# Patient Record
Sex: Female | Born: 1970
Health system: Southern US, Community
[De-identification: ages and names within clinical notes are randomized; demographics above are authoritative.]

## PROBLEM LIST (undated history)

## (undated) DIAGNOSIS — K861 Other chronic pancreatitis: Secondary | ICD-10-CM

## (undated) DIAGNOSIS — F1910 Other psychoactive substance abuse, uncomplicated: Secondary | ICD-10-CM

## (undated) DIAGNOSIS — D72829 Elevated white blood cell count, unspecified: Secondary | ICD-10-CM

## (undated) DIAGNOSIS — F1010 Alcohol abuse, uncomplicated: Secondary | ICD-10-CM

## (undated) DIAGNOSIS — D649 Anemia, unspecified: Secondary | ICD-10-CM

## (undated) DIAGNOSIS — I10 Essential (primary) hypertension: Secondary | ICD-10-CM

## (undated) DIAGNOSIS — B9620 Unspecified Escherichia coli [E. coli] as the cause of diseases classified elsewhere: Secondary | ICD-10-CM

## (undated) DIAGNOSIS — R7881 Bacteremia: Secondary | ICD-10-CM

## (undated) DIAGNOSIS — T783XXA Angioneurotic edema, initial encounter: Secondary | ICD-10-CM

## (undated) DIAGNOSIS — L0390 Cellulitis, unspecified: Secondary | ICD-10-CM

## (undated) HISTORY — DX: Angioneurotic edema, initial encounter: T78.3XXA

## (undated) HISTORY — DX: Unspecified Escherichia coli (E. coli) as the cause of diseases classified elsewhere: B96.20

## (undated) HISTORY — DX: Bacteremia: R78.81

## (undated) HISTORY — PX: BREAST REDUCTION SURGERY: SHX8

## (undated) HISTORY — DX: Alcohol abuse, uncomplicated: F10.10

## (undated) HISTORY — DX: Elevated white blood cell count, unspecified: D72.829

## (undated) HISTORY — DX: Other chronic pancreatitis: K86.1

## (undated) HISTORY — DX: Essential (primary) hypertension: I10

## (undated) HISTORY — DX: Other psychoactive substance abuse, uncomplicated: F19.10

## (undated) HISTORY — DX: Anemia, unspecified: D64.9

---

## 1997-05-30 ENCOUNTER — Ambulatory Visit (HOSPITAL_COMMUNITY): Admission: RE | Admit: 1997-05-30 | Discharge: 1997-05-30 | Payer: Self-pay | Admitting: Obstetrics & Gynecology

## 1997-07-11 ENCOUNTER — Ambulatory Visit (HOSPITAL_COMMUNITY): Admission: RE | Admit: 1997-07-11 | Discharge: 1997-07-11 | Payer: Self-pay | Admitting: Obstetrics & Gynecology

## 1997-08-04 ENCOUNTER — Encounter: Admission: RE | Admit: 1997-08-04 | Discharge: 1997-11-02 | Payer: Self-pay | Admitting: Obstetrics

## 1997-08-18 ENCOUNTER — Ambulatory Visit (HOSPITAL_COMMUNITY): Admission: RE | Admit: 1997-08-18 | Discharge: 1997-08-18 | Payer: Self-pay | Admitting: Obstetrics

## 1997-08-26 ENCOUNTER — Inpatient Hospital Stay (HOSPITAL_COMMUNITY): Admission: AD | Admit: 1997-08-26 | Discharge: 1997-08-29 | Payer: Self-pay | Admitting: *Deleted

## 1997-10-24 ENCOUNTER — Emergency Department (HOSPITAL_COMMUNITY): Admission: EM | Admit: 1997-10-24 | Discharge: 1997-10-24 | Payer: Self-pay | Admitting: Emergency Medicine

## 1997-10-27 ENCOUNTER — Encounter: Admission: RE | Admit: 1997-10-27 | Discharge: 1997-10-27 | Payer: Self-pay | Admitting: Family Medicine

## 1998-02-03 ENCOUNTER — Encounter: Admission: RE | Admit: 1998-02-03 | Discharge: 1998-02-03 | Payer: Self-pay | Admitting: Family Medicine

## 1998-04-02 ENCOUNTER — Emergency Department (HOSPITAL_COMMUNITY): Admission: EM | Admit: 1998-04-02 | Discharge: 1998-04-02 | Payer: Self-pay | Admitting: Emergency Medicine

## 1998-07-18 ENCOUNTER — Emergency Department (HOSPITAL_COMMUNITY): Admission: EM | Admit: 1998-07-18 | Discharge: 1998-07-18 | Payer: Self-pay | Admitting: Emergency Medicine

## 2000-01-24 ENCOUNTER — Emergency Department (HOSPITAL_COMMUNITY): Admission: EM | Admit: 2000-01-24 | Discharge: 2000-01-24 | Payer: Self-pay | Admitting: Emergency Medicine

## 2000-12-04 ENCOUNTER — Encounter: Payer: Self-pay | Admitting: Emergency Medicine

## 2000-12-04 ENCOUNTER — Emergency Department (HOSPITAL_COMMUNITY): Admission: EM | Admit: 2000-12-04 | Discharge: 2000-12-04 | Payer: Self-pay | Admitting: Emergency Medicine

## 2001-01-05 ENCOUNTER — Other Ambulatory Visit: Admission: RE | Admit: 2001-01-05 | Discharge: 2001-01-05 | Payer: Self-pay | Admitting: Family Medicine

## 2001-01-05 ENCOUNTER — Encounter: Admission: RE | Admit: 2001-01-05 | Discharge: 2001-01-05 | Payer: Self-pay | Admitting: Family Medicine

## 2001-02-02 ENCOUNTER — Encounter: Admission: RE | Admit: 2001-02-02 | Discharge: 2001-02-02 | Payer: Self-pay | Admitting: Family Medicine

## 2001-05-11 ENCOUNTER — Encounter: Admission: RE | Admit: 2001-05-11 | Discharge: 2001-05-11 | Payer: Self-pay | Admitting: Family Medicine

## 2001-06-11 ENCOUNTER — Encounter: Admission: RE | Admit: 2001-06-11 | Discharge: 2001-06-11 | Payer: Self-pay | Admitting: Family Medicine

## 2001-10-06 ENCOUNTER — Encounter: Payer: Self-pay | Admitting: Sports Medicine

## 2001-10-06 ENCOUNTER — Encounter: Admission: RE | Admit: 2001-10-06 | Discharge: 2001-10-06 | Payer: Self-pay | Admitting: Family Medicine

## 2001-10-06 ENCOUNTER — Encounter: Admission: RE | Admit: 2001-10-06 | Discharge: 2001-10-06 | Payer: Self-pay | Admitting: Sports Medicine

## 2001-10-07 ENCOUNTER — Encounter: Admission: RE | Admit: 2001-10-07 | Discharge: 2001-10-07 | Payer: Self-pay | Admitting: Family Medicine

## 2001-10-21 ENCOUNTER — Encounter: Admission: RE | Admit: 2001-10-21 | Discharge: 2001-10-21 | Payer: Self-pay | Admitting: Family Medicine

## 2001-12-30 ENCOUNTER — Encounter: Admission: RE | Admit: 2001-12-30 | Discharge: 2001-12-30 | Payer: Self-pay | Admitting: Family Medicine

## 2002-01-11 ENCOUNTER — Encounter: Admission: RE | Admit: 2002-01-11 | Discharge: 2002-01-11 | Payer: Self-pay | Admitting: Family Medicine

## 2002-02-19 ENCOUNTER — Emergency Department (HOSPITAL_COMMUNITY): Admission: EM | Admit: 2002-02-19 | Discharge: 2002-02-19 | Payer: Self-pay | Admitting: Emergency Medicine

## 2002-02-22 ENCOUNTER — Encounter: Admission: RE | Admit: 2002-02-22 | Discharge: 2002-02-22 | Payer: Self-pay | Admitting: Family Medicine

## 2002-04-06 ENCOUNTER — Encounter: Admission: RE | Admit: 2002-04-06 | Discharge: 2002-04-06 | Payer: Self-pay | Admitting: Family Medicine

## 2002-05-05 ENCOUNTER — Encounter: Admission: RE | Admit: 2002-05-05 | Discharge: 2002-05-05 | Payer: Self-pay | Admitting: Family Medicine

## 2002-08-18 ENCOUNTER — Other Ambulatory Visit: Admission: RE | Admit: 2002-08-18 | Discharge: 2002-08-18 | Payer: Self-pay | Admitting: Family Medicine

## 2002-08-18 ENCOUNTER — Encounter: Admission: RE | Admit: 2002-08-18 | Discharge: 2002-08-18 | Payer: Self-pay | Admitting: Family Medicine

## 2002-09-10 ENCOUNTER — Encounter: Admission: RE | Admit: 2002-09-10 | Discharge: 2002-12-09 | Payer: Self-pay | Admitting: Family Medicine

## 2002-09-14 ENCOUNTER — Encounter: Admission: RE | Admit: 2002-09-14 | Discharge: 2002-09-14 | Payer: Self-pay | Admitting: Family Medicine

## 2002-09-30 ENCOUNTER — Encounter: Admission: RE | Admit: 2002-09-30 | Discharge: 2002-09-30 | Payer: Self-pay | Admitting: Sports Medicine

## 2003-01-06 ENCOUNTER — Encounter: Admission: RE | Admit: 2003-01-06 | Discharge: 2003-01-06 | Payer: Self-pay | Admitting: Family Medicine

## 2003-09-26 ENCOUNTER — Emergency Department (HOSPITAL_COMMUNITY): Admission: EM | Admit: 2003-09-26 | Discharge: 2003-09-26 | Payer: Self-pay | Admitting: *Deleted

## 2003-11-02 ENCOUNTER — Encounter: Admission: RE | Admit: 2003-11-02 | Discharge: 2003-11-02 | Payer: Self-pay | Admitting: Family Medicine

## 2004-01-11 ENCOUNTER — Inpatient Hospital Stay (HOSPITAL_COMMUNITY): Admission: EM | Admit: 2004-01-11 | Discharge: 2004-01-13 | Payer: Self-pay | Admitting: Emergency Medicine

## 2004-01-11 ENCOUNTER — Ambulatory Visit: Payer: Self-pay | Admitting: Family Medicine

## 2004-01-27 ENCOUNTER — Ambulatory Visit: Payer: Self-pay | Admitting: Family Medicine

## 2004-05-17 ENCOUNTER — Other Ambulatory Visit: Admission: RE | Admit: 2004-05-17 | Discharge: 2004-05-17 | Payer: Self-pay | Admitting: Family Medicine

## 2004-05-17 ENCOUNTER — Encounter (INDEPENDENT_AMBULATORY_CARE_PROVIDER_SITE_OTHER): Payer: Self-pay | Admitting: *Deleted

## 2004-05-17 ENCOUNTER — Ambulatory Visit: Payer: Self-pay | Admitting: Family Medicine

## 2004-08-28 ENCOUNTER — Ambulatory Visit: Payer: Self-pay | Admitting: Family Medicine

## 2005-11-22 ENCOUNTER — Ambulatory Visit: Payer: Self-pay | Admitting: Family Medicine

## 2005-12-12 ENCOUNTER — Ambulatory Visit: Payer: Self-pay | Admitting: Family Medicine

## 2005-12-16 ENCOUNTER — Ambulatory Visit: Payer: Self-pay | Admitting: Family Medicine

## 2005-12-18 ENCOUNTER — Ambulatory Visit: Payer: Self-pay | Admitting: Family Medicine

## 2005-12-28 ENCOUNTER — Encounter (INDEPENDENT_AMBULATORY_CARE_PROVIDER_SITE_OTHER): Payer: Self-pay | Admitting: *Deleted

## 2005-12-28 LAB — CONVERTED CEMR LAB

## 2006-01-09 ENCOUNTER — Ambulatory Visit: Payer: Self-pay | Admitting: Family Medicine

## 2006-01-22 ENCOUNTER — Ambulatory Visit: Payer: Self-pay | Admitting: Family Medicine

## 2006-06-26 DIAGNOSIS — E1140 Type 2 diabetes mellitus with diabetic neuropathy, unspecified: Secondary | ICD-10-CM | POA: Insufficient documentation

## 2006-06-26 DIAGNOSIS — I10 Essential (primary) hypertension: Secondary | ICD-10-CM | POA: Insufficient documentation

## 2006-06-27 ENCOUNTER — Encounter (INDEPENDENT_AMBULATORY_CARE_PROVIDER_SITE_OTHER): Payer: Self-pay | Admitting: *Deleted

## 2006-10-16 ENCOUNTER — Encounter (INDEPENDENT_AMBULATORY_CARE_PROVIDER_SITE_OTHER): Payer: Self-pay | Admitting: Family Medicine

## 2006-10-16 ENCOUNTER — Ambulatory Visit: Payer: Self-pay | Admitting: Sports Medicine

## 2006-10-16 LAB — CONVERTED CEMR LAB
BUN: 13 mg/dL (ref 6–23)
CO2: 20 meq/L (ref 19–32)
Calcium: 9.9 mg/dL (ref 8.4–10.5)
Chloride: 96 meq/L (ref 96–112)
Creatinine, Ser: 0.81 mg/dL (ref 0.40–1.20)
Glucose, Bld: 289 mg/dL — ABNORMAL HIGH (ref 70–99)
Potassium: 3.9 meq/L (ref 3.5–5.3)
Sodium: 137 meq/L (ref 135–145)

## 2006-10-17 ENCOUNTER — Encounter (INDEPENDENT_AMBULATORY_CARE_PROVIDER_SITE_OTHER): Payer: Self-pay | Admitting: Family Medicine

## 2007-01-20 ENCOUNTER — Encounter: Payer: Self-pay | Admitting: Family Medicine

## 2007-01-20 ENCOUNTER — Ambulatory Visit: Payer: Self-pay | Admitting: Family Medicine

## 2007-01-20 LAB — CONVERTED CEMR LAB
ALT: 11 units/L (ref 0–35)
AST: 12 units/L (ref 0–37)
Albumin: 4.1 g/dL (ref 3.5–5.2)
Alkaline Phosphatase: 92 units/L (ref 39–117)
Amylase: 24 units/L (ref 0–105)
BUN: 9 mg/dL (ref 6–23)
CO2: 20 meq/L (ref 19–32)
Calcium: 9 mg/dL (ref 8.4–10.5)
Chloride: 105 meq/L (ref 96–112)
Creatinine, Ser: 0.78 mg/dL (ref 0.40–1.20)
Direct LDL: 98 mg/dL
Glucose, Bld: 265 mg/dL — ABNORMAL HIGH (ref 70–99)
H Pylori IgG: NEGATIVE
Hgb A1c MFr Bld: 8.9 %
Lipase: <10 units/L (ref 0–75)
Potassium: 4.3 meq/L (ref 3.5–5.3)
Sodium: 138 meq/L (ref 135–145)
Total Bilirubin: 0.3 mg/dL (ref 0.3–1.2)
Total Protein: 6.6 g/dL (ref 6.0–8.3)

## 2007-03-11 ENCOUNTER — Encounter: Payer: Self-pay | Admitting: Family Medicine

## 2007-03-11 ENCOUNTER — Ambulatory Visit: Payer: Self-pay | Admitting: Family Medicine

## 2007-03-11 LAB — CONVERTED CEMR LAB
Direct LDL: 85 mg/dL
Hgb A1c MFr Bld: 10.1 %
TSH: 1.2 microintl units/mL (ref 0.350–5.50)

## 2007-04-07 ENCOUNTER — Ambulatory Visit: Payer: Self-pay | Admitting: Family Medicine

## 2007-04-07 ENCOUNTER — Encounter: Payer: Self-pay | Admitting: Family Medicine

## 2007-04-07 ENCOUNTER — Other Ambulatory Visit: Admission: RE | Admit: 2007-04-07 | Discharge: 2007-04-07 | Payer: Self-pay | Admitting: Emergency Medicine

## 2007-04-07 LAB — CONVERTED CEMR LAB
Chlamydia, DNA Probe: NEGATIVE
GC Probe Amp, Genital: NEGATIVE
Hgb A1c MFr Bld: 9.6 %
Whiff Test: NEGATIVE

## 2007-05-13 ENCOUNTER — Telehealth (INDEPENDENT_AMBULATORY_CARE_PROVIDER_SITE_OTHER): Payer: Self-pay | Admitting: Family Medicine

## 2007-05-27 ENCOUNTER — Ambulatory Visit: Payer: Self-pay | Admitting: Family Medicine

## 2007-05-29 ENCOUNTER — Ambulatory Visit: Payer: Self-pay | Admitting: Family Medicine

## 2007-09-09 ENCOUNTER — Ambulatory Visit: Payer: Self-pay | Admitting: Family Medicine

## 2007-09-09 ENCOUNTER — Encounter: Payer: Self-pay | Admitting: Family Medicine

## 2007-09-09 LAB — CONVERTED CEMR LAB
CO2: 20 meq/L (ref 19–32)
Calcium: 9.1 mg/dL (ref 8.4–10.5)
Chloride: 106 meq/L (ref 96–112)
Hgb A1c MFr Bld: 7.3 %
Sodium: 137 meq/L (ref 135–145)

## 2008-05-03 ENCOUNTER — Telehealth: Payer: Self-pay | Admitting: Family Medicine

## 2008-08-05 ENCOUNTER — Telehealth: Payer: Self-pay | Admitting: Family Medicine

## 2008-08-05 ENCOUNTER — Encounter: Payer: Self-pay | Admitting: *Deleted

## 2008-08-17 ENCOUNTER — Ambulatory Visit: Payer: Self-pay | Admitting: Family Medicine

## 2008-08-19 ENCOUNTER — Ambulatory Visit: Payer: Self-pay | Admitting: Family Medicine

## 2008-09-12 ENCOUNTER — Encounter: Payer: Self-pay | Admitting: Family Medicine

## 2008-09-12 ENCOUNTER — Ambulatory Visit: Payer: Self-pay | Admitting: Family Medicine

## 2008-09-12 DIAGNOSIS — E785 Hyperlipidemia, unspecified: Secondary | ICD-10-CM | POA: Insufficient documentation

## 2008-09-12 DIAGNOSIS — F17200 Nicotine dependence, unspecified, uncomplicated: Secondary | ICD-10-CM | POA: Insufficient documentation

## 2008-09-12 LAB — CONVERTED CEMR LAB: Whiff Test: POSITIVE

## 2008-09-13 LAB — CONVERTED CEMR LAB
Chlamydia, DNA Probe: NEGATIVE
GC Probe Amp, Genital: NEGATIVE

## 2008-09-16 ENCOUNTER — Ambulatory Visit: Payer: Self-pay | Admitting: Family Medicine

## 2008-09-16 ENCOUNTER — Encounter: Payer: Self-pay | Admitting: Family Medicine

## 2008-09-19 ENCOUNTER — Encounter: Payer: Self-pay | Admitting: Family Medicine

## 2008-09-19 LAB — CONVERTED CEMR LAB
ALT: 19 units/L (ref 0–35)
AST: 44 units/L — ABNORMAL HIGH (ref 0–37)
Alkaline Phosphatase: 75 units/L (ref 39–117)
BUN: 6 mg/dL (ref 6–23)
Basophils Absolute: 0 10*3/uL (ref 0.0–0.1)
Basophils Relative: 0 % (ref 0–1)
Calcium: 8.8 mg/dL (ref 8.4–10.5)
Chloride: 104 meq/L (ref 96–112)
Creatinine, Ser: 0.8 mg/dL (ref 0.40–1.20)
Eosinophils Absolute: 0 10*3/uL (ref 0.0–0.7)
Eosinophils Relative: 0 % (ref 0–5)
HCT: 31.8 % — ABNORMAL LOW (ref 36.0–46.0)
HDL: 77 mg/dL (ref >39)
LDL Cholesterol: 43 mg/dL (ref 0–99)
Lymphs Abs: 1.4 10*3/uL (ref 0.7–4.0)
MCV: 80.9 fL (ref 78.0–100.0)
Neutrophils Relative %: 67 % (ref 43–77)
Platelets: 191 10*3/uL (ref 150–400)
RDW: 17.4 % — ABNORMAL HIGH (ref 11.5–15.5)
Total Bilirubin: 0.4 mg/dL (ref 0.3–1.2)
Total CHOL/HDL Ratio: 1.7
VLDL: 14 mg/dL (ref 0–40)
WBC: 5.7 10*3/uL (ref 4.0–10.5)

## 2008-09-27 ENCOUNTER — Telehealth: Payer: Self-pay | Admitting: Family Medicine

## 2008-09-28 ENCOUNTER — Encounter: Payer: Self-pay | Admitting: Family Medicine

## 2008-09-28 DIAGNOSIS — D649 Anemia, unspecified: Secondary | ICD-10-CM | POA: Insufficient documentation

## 2009-05-08 ENCOUNTER — Encounter (INDEPENDENT_AMBULATORY_CARE_PROVIDER_SITE_OTHER): Payer: Self-pay | Admitting: *Deleted

## 2009-05-30 ENCOUNTER — Telehealth: Payer: Self-pay | Admitting: Family Medicine

## 2009-06-27 ENCOUNTER — Emergency Department (HOSPITAL_COMMUNITY): Admission: EM | Admit: 2009-06-27 | Discharge: 2009-06-28 | Payer: Self-pay | Admitting: Emergency Medicine

## 2009-07-05 ENCOUNTER — Ambulatory Visit: Payer: Self-pay | Admitting: Family Medicine

## 2009-07-05 ENCOUNTER — Encounter: Payer: Self-pay | Admitting: Family Medicine

## 2009-07-05 DIAGNOSIS — Z9189 Other specified personal risk factors, not elsewhere classified: Secondary | ICD-10-CM | POA: Insufficient documentation

## 2009-07-06 LAB — CONVERTED CEMR LAB
ALT: 19 units/L (ref 0–35)
Barbiturate Quant, Ur: NEGATIVE
Basophils Relative: 0 % (ref 0–1)
CO2: 28 meq/L (ref 19–32)
Calcium: 9.4 mg/dL (ref 8.4–10.5)
Chloride: 100 meq/L (ref 96–112)
Creatinine,U: 56.9 mg/dL
Eosinophils Absolute: 0.1 10*3/uL (ref 0.0–0.7)
Ethyl Alcohol: <10 mg/dL (ref <10)
Ferritin: 14 ng/mL (ref 10–291)
Glucose, Bld: 273 mg/dL — ABNORMAL HIGH (ref 70–99)
Hemoglobin: 10.2 g/dL — ABNORMAL LOW (ref 12.0–15.0)
Lymphs Abs: 1.6 10*3/uL (ref 0.7–4.0)
MCHC: 31.8 g/dL (ref 30.0–36.0)
MCV: 86.8 fL (ref 78.0–100.0)
Methadone: NEGATIVE
Monocytes Absolute: 0.4 10*3/uL (ref 0.1–1.0)
Monocytes Relative: 6 % (ref 3–12)
Neutrophils Relative %: 67 % (ref 43–77)
Opiate Screen, Urine: NEGATIVE
Propoxyphene: NEGATIVE
RBC: 3.7 M/uL — ABNORMAL LOW (ref 3.87–5.11)
Sodium: 136 meq/L (ref 135–145)
TSH: 1.215 microintl units/mL (ref 0.350–4.500)
Total Protein: 6.3 g/dL (ref 6.0–8.3)
UIBC: 395 ug/dL
WBC: 6 10*3/uL (ref 4.0–10.5)

## 2009-09-27 ENCOUNTER — Ambulatory Visit: Payer: Self-pay | Admitting: Family Medicine

## 2009-09-27 ENCOUNTER — Encounter: Payer: Self-pay | Admitting: Family Medicine

## 2009-09-27 DIAGNOSIS — L989 Disorder of the skin and subcutaneous tissue, unspecified: Secondary | ICD-10-CM | POA: Insufficient documentation

## 2009-10-06 LAB — CONVERTED CEMR LAB
Basophils Absolute: 0 10*3/uL (ref 0.0–0.1)
Basophils Relative: 1 % (ref 0–1)
Eosinophils Absolute: 0.1 10*3/uL (ref 0.0–0.7)
Eosinophils Relative: 2 % (ref 0–5)
HCT: 28.3 % — ABNORMAL LOW (ref 36.0–46.0)
Lymphs Abs: 1.4 10*3/uL (ref 0.7–4.0)
MCHC: 32.2 g/dL (ref 30.0–36.0)
MCV: 87.1 fL (ref 78.0–100.0)
Platelets: 230 10*3/uL (ref 150–400)
RDW: 17.1 % — ABNORMAL HIGH (ref 11.5–15.5)

## 2009-10-09 ENCOUNTER — Encounter: Payer: Self-pay | Admitting: *Deleted

## 2009-10-09 ENCOUNTER — Ambulatory Visit: Payer: Self-pay | Admitting: Family Medicine

## 2009-10-09 ENCOUNTER — Encounter: Payer: Self-pay | Admitting: Family Medicine

## 2009-10-10 LAB — CONVERTED CEMR LAB
Basophils Absolute: 0 10*3/uL (ref 0.0–0.1)
Basophils Relative: 0 % (ref 0–1)
Hemoglobin: 10.2 g/dL — ABNORMAL LOW (ref 12.0–15.0)
MCHC: 31.9 g/dL (ref 30.0–36.0)
Monocytes Absolute: 0.5 10*3/uL (ref 0.1–1.0)
Neutro Abs: 4.5 10*3/uL (ref 1.7–7.7)
Platelets: 260 10*3/uL (ref 150–400)
RDW: 17.9 % — ABNORMAL HIGH (ref 11.5–15.5)
Retic Ct Pct: 1.5 % (ref 0.4–3.1)

## 2009-10-11 ENCOUNTER — Ambulatory Visit: Payer: Self-pay | Admitting: Family Medicine

## 2009-10-17 ENCOUNTER — Telehealth: Payer: Self-pay | Admitting: Family Medicine

## 2009-12-20 ENCOUNTER — Encounter: Payer: Self-pay | Admitting: Family Medicine

## 2010-02-14 ENCOUNTER — Telehealth: Payer: Self-pay | Admitting: *Deleted

## 2010-02-19 ENCOUNTER — Encounter: Payer: Self-pay | Admitting: *Deleted

## 2010-03-12 ENCOUNTER — Telehealth (INDEPENDENT_AMBULATORY_CARE_PROVIDER_SITE_OTHER): Payer: Self-pay | Admitting: *Deleted

## 2010-03-14 ENCOUNTER — Telehealth: Payer: Self-pay | Admitting: *Deleted

## 2010-04-09 ENCOUNTER — Ambulatory Visit: Payer: Self-pay

## 2010-05-31 NOTE — Miscellaneous (Signed)
Summary: needs meter  Clinical Lists Changes pt [facility] not [facility] Pro[facility]y [facility] since medicaid is rejecting it. I spoke with the pharmacist who states when she gets time, she will call medicaid to find out why not. lm for pt that [facility] & I will call her when I have an answer.Marland KitchenMarland KitchenGolden Circle RN  July 05, 2009 4:35 PM   Appended Document: needs meter the prodigy went thru this time

## 2010-05-31 NOTE — Letter (Signed)
Summary: Generic Letter  [facility]  [address]   [place], [place] [address]   Phone: [phone]  Fax: [phone]      Christine [name] [address] [place], [place]  [address]  Dear [name],  I will provide you a 1 month refill on the Metformin but you are overdue for a follow up appointment.  Please call the office at [phone] to schedule an appointment.   We were unable to reach you by phone, please call the office at [phone] with a contact number.  Sincerely,   Gladstone Pih

## 2010-05-31 NOTE — Progress Notes (Signed)
Summary: needs note  Phone Note Call from Patient Call back at [phone]   Caller: Patient Summary of Call: needs a note stating what kind of condtion/sickness she had during the months of Jan & Feb 2011- needs to give this to school for her being absent. Initial call taken by: De Nurse,  February 14, 2010 2:04 PM  Follow-up for Phone Call        DWT we have NO office visits for her here in [place] or Feb 2011 so I obviously cannot write her a note.  Thanks!  Denny Levy MD  February 15, 2010 12:22 PM   Additional Follow-up for Phone Call Additional follow up Details #1::        called to inform and got hung up on. will try again later Additional Follow-up by: Jimmy Footman, CMA,  February 15, 2010 1:58 PM    Additional Follow-up for Phone Call Additional follow up Details #2::    pt stated that it was not jan or feb. informed her that we have mar and june. I will write letter stating this and mail it  Follow-up by: Loralee Pacas CMA,  February 19, 2010 10:24 AM

## 2010-05-31 NOTE — Assessment & Plan Note (Signed)
Summary: f/u chronic issues   Vital Signs:  Patient profile:   40 year old female Height:      69 inches Weight:      139.3 pounds BMI:     20.65 Temp:     97.7 degrees F oral Pulse rate:   87 / minute BP sitting:   108 / 69  (left arm) Cuff size:   regular  Vitals Entered By: Gladstone Pih (July 05, 2009 8:38 AM) CC: f/u ER visit Is Patient Diabetic? Yes Did you bring your meter with you today? No Pain Assessment Patient in pain? no        Primary Care Provider:  Marisue Ivan  MD  CC:  f/u ER visit.  History of Present Illness: 40yo F here for ER f/u and f/u of chronic issues  s/p ER visit: 06/28/2009 dx of gastroparesis.  Resolved now.  She never picked up the Reglan prescribed.   HTN: No adverse effects from medication.  Not checking it regularly.  Was not well controlled at last visit.  No dizziness, HA, CP, palpitations, or swelling.  DM:  Currently on Lantus 18units in the AM, Metformin, and Glipizide (out of med for several months).  No adverse effects.  Pt states that she was not able to obtain the glucometer.  Due for diabetic eye exam.  HLD: Tolerating medication.  No adverse effects.  No muscle pain or abd pain.  Unintentional wt loss: Loss over 10lbs since last visit.  Denies any diarrhea or N/V.  Appetite is decreased. No fevers or night sweats.  Has some temperature instability.    Anemia: Normocytic anemia.  Not taking iron supplements.         Habits & Providers  Alcohol-Tobacco-Diet     Tobacco Status: current     Tobacco Counseling: to quit use of tobacco products     Cigarette Packs/Day: 0.5  Current Medications (verified): 1)  Glipizide 10 Mg Tb24 (Glipizide) .... Take 1 Tablet By Mouth Once A Day 2)  Lantus 100 Unit/ml Soln (Insulin Glargine) .... Inject 18 Unit Subcutaneously Once A Day 3)  Lisinopril-Hydrochlorothiazide 20-25 Mg Tabs (Lisinopril-Hydrochlorothiazide) .... One Tablet By Mouth Daily 4)  Metformin Hcl 1000 Mg Tabs  (Metformin Hcl) .... Take 1 Tablet By Mouth Twice A Day 5)  Simvastatin 20 Mg Tabs (Simvastatin) .Marland Kitchen.. 1 Tab By Mouth At Bedtime. 6)  Prodigy Blood Glucose Test  Strp (Glucose Blood) .... Use Up To 3 Times A Day To Test Blood Sugar 7)  Prodigy Twist Top Lancets 28g  Misc (Lancets) .... Use Up To 3 Times A Day To Check Blood Sugars 8)  Ferrous Sulfate 324 Mg Tbec (Ferrous Sulfate) .... One Tablet By Mouth Two Times A Day 9)  Prodigy Autocode Blood Glucose  Devi (Blood Glucose Monitoring Suppl) .... Check Blood Sugar Up To 3 Times A Day  Allergies (verified): No Known Drug Allergies  Past History:  Past Medical History: Tobacco use HTN DM I HLD Anemia  Social History: Packs/Day:  0.5  Review of Systems       No dizziness, HA, CP, palpitations, or swelling. No muscle pain or abd pain.  Physical Exam  General:  VS Reviewed. Well appearing, NAD.  Neck:  supple, full ROM, no goiter or mass  Lungs:  Normal respiratory effort, chest expands symmetrically. Lungs are clear to auscultation, no crackles or wheezes. Heart:  Normal rate and regular rhythm. S1 and S2 normal without gallop, murmur, click, rub or other extra  sounds. Abdomen:  Soft, NT, ND, no HSM, active BS  Extremities:  no edema Neurologic:  no focal deficits   Impression & Recommendations:  Problem # 1:  WEIGHT LOSS (ICD-783.21) Assessment New Lost 12lbs since 08/2008.  Unintentional.  No red flag symptoms. Will check TSH and follow trend. Will have her return in 2 weeks and will discuss interventions to help with maintaining wt.  Orders: TSH-[facility] ([address]) [facility]- Est  Level 4 ([address])  Problem # 2:  HYPERTENSION, BENIGN SYSTEMIC (ICD-401.1) Assessment: Improved  At goal (<140/90). No changes in meds. States that she has enough medications. Will check renal fxn and electrolytes today.  Her updated medication list for this problem includes:    Lisinopril-hydrochlorothiazide 20-25 Mg Tabs  (Lisinopril-hydrochlorothiazide) ..... One tablet by mouth daily  Orders: [facility]- Est  Level 4 ([address])  Problem # 3:  DIABETES MELLITUS, I (ICD-250.01) Assessment: Deteriorated A1c 11%.  Worsened course. She is not checking her blood sugar so I'm hesistant to go up on the Lantus until she obtains a glucometer to check her CBGs.  Advised to restart the glipizide.  I think that the gastroparesis is a result of her uncontrolled DM.   Will set her up for diabetic eye exam.  Her updated medication list for this problem includes:    Glipizide 10 Mg Tb24 (Glipizide) .Marland Kitchen... Take 1 tablet by mouth once a day    Lantus 100 Unit/ml Soln (Insulin glargine) ..... Inject 18 unit subcutaneously once a day    Lisinopril-hydrochlorothiazide 20-25 Mg Tabs (Lisinopril-hydrochlorothiazide) ..... One tablet by mouth daily    Metformin Hcl 1000 Mg Tabs (Metformin hcl) .Marland Kitchen... Take 1 tablet by mouth twice a day  Orders: A1C-[facility] (29562) [facility]- Est  Level 4 ([address])  Problem # 4:  ANEMIA (ICD-285.9) Assessment: Unchanged Pt not symptomatic.   She is not taking her iron supplements. Plan to recheck CBC w/ diff today along with an anemia panel.   Will have her restart the iron supplements.  Her updated medication list for this problem includes:    Ferrous Sulfate 324 Mg Tbec (Ferrous sulfate) ..... One tablet by mouth two times a day  Orders: CBC w/Diff-[facility] ([address]) Ferritin-[facility] [phone]) Iron -[facility] ([id number]) Iron Binding Cap (TIBC)-[facility] ([address]) [facility]- Est  Level 4 ([address])  Problem # 5:  HYPERLIPIDEMIA (ICD-272.4) Assessment: Unchanged Pt not fasting today.  She was at goal prior. Will check LDL today.  Her updated medication list for this problem includes:    Simvastatin 20 Mg Tabs (Simvastatin) .Marland Kitchen... 1 tab by mouth at bedtime.  Orders: T-Lipoprotein (LDL cholesterol)  ([id number]) [facility]- Est  Level 4 ([address])  Problem # 6:  DRUG ABUSE, HX OF (ICD-V15.89) Assessment: Comment Only Pt requesting  a UDS b/c she is about to apply for a job. Discussed risks and hazards of marjuana use.  Orders: Miscellaneous Lab Charge-[facility] [id number]) [facility]- Est  Level 4 ([address])  Complete Medication List: 1)  Glipizide 10 Mg Tb24 (Glipizide) .... Take 1 tablet by mouth once a day 2)  Lantus 100 Unit/ml Soln (Insulin glargine) .... Inject 18 unit subcutaneously once a day 3)  Lisinopril-hydrochlorothiazide 20-25 Mg Tabs (Lisinopril-hydrochlorothiazide) .... One tablet by mouth daily 4)  Metformin Hcl 1000 Mg Tabs (Metformin hcl) .... Take 1 tablet by mouth twice a day 5)  Simvastatin 20 Mg Tabs (Simvastatin) .Marland Kitchen.. 1 tab by mouth at bedtime. 6)  Prodigy Blood Glucose Test Strp (Glucose blood) .... Use up to 3 times a day to test blood sugar 7)  Prodigy  Twist Top Lancets 28g Misc (Lancets) .... Use up to 3 times a day to check blood sugars 8)  Ferrous Sulfate 324 Mg Tbec (Ferrous sulfate) .... One tablet by mouth two times a day 9)  Prodigy Autocode Blood Glucose Devi (Blood glucose monitoring suppl) .... Check blood sugar up to 3 times a day  Other Orders: T-Comprehensive Metabolic Panel ([id number])  Patient Instructions: 1)  Please schedule a follow-up appointment in 2 weeks to discuss wt loss and diabetes. 2)  Pick up the prodigy glucometer and bring it with you next time.  If you run high >200 in the morning, call me.   Prescriptions: PRODIGY AUTOCODE BLOOD GLUCOSE  DEVI (BLOOD GLUCOSE MONITORING SUPPL) check blood sugar up to 3 times a day  #1 x 0   Entered and Authorized by:   Marisue Ivan  MD   Signed by:   Marisue Ivan  MD on 07/05/2009   Method used:   [facility]  [address] [phone][facility] ([facility]       [address]       [place], [place]  [id number]       Ph: [phone] or [phone]       Fax: [phone]   RxID:   [id number] METFORMIN HCL 1000 MG TABS (METFORMIN HCL) Take 1 tablet by mouth twice a day  #180 x 0   Entered and  Authorized by:   Marisue Ivan  MD   Signed by:   Marisue Ivan  MD on 07/05/2009   Method used:   [facility]        [facility]  [address] [phone]* (retail)       [address]       [place], [place]  [address]       Ph: [phone] or [phone]       Fax: [phone]   RxID:   [id number] GLIPIZIDE 10 MG TB24 (GLIPIZIDE) Take 1 tablet by mouth once a day  #90 x 0   Entered and Authorized by:   Marisue Ivan  MD   Signed by:   Marisue Ivan  MD on 07/05/2009   Method used:   [facility]        [facility] [address] [phone]* (retail)       [address]       [place], [place]  [address]       Ph: [phone] or [phone]       Fax: [phone]   RxID:   [id number]   Laboratory Results   Blood Tests   Date/Time Received: July 05, 2009 8:57 AM  Date/Time Reported: July 05, 2009 9:13 AM   HGBA1C: 11.0%   (Normal Range: Non-Diabetic - 3-6%   Control Diabetic - 6-8%)  Comments: ...........test performed by...........Marland KitchenTerese Door, CMA      Prevention & Chronic Care Immunizations   Influenza vaccine: Not documented   Influenza vaccine deferral: Refused  (07/05/2009)    Tetanus booster: 04/29/2002: Done.    Pneumococcal vaccine: Not documented  Other Screening   Pap smear: NEGATIVE FOR INTRAEPITHELIAL LESIONS OR MALIGNANCY.  (09/12/2008)   Smoking status: current  (07/05/2009)   Smoking cessation counseling: yes  (09/09/2007)  Diabetes Mellitus   HgbA1C: 11.0  (07/05/2009)    Eye exam: Not documented  Diabetic eye exam action/deferral: Ophthalmology referral  (07/05/2009)    Foot exam: yes  (09/09/2007)   Foot exam action/deferral: Do today   High risk foot: Not documented   Foot care education: Done  (07/05/2009)    Urine microalbumin/creatinine ratio: Not documented    Diabetes flowsheet reviewed?: Yes   Progress toward A1C goal: Deteriorated  Lipids    Total Cholesterol: 134  (09/16/2008)   Lipid panel action/deferral: LDL Direct Ordered   LDL: 43  (09/16/2008)   LDL Direct: 85  (03/11/2007)   HDL: 77  (09/16/2008)   Triglycerides: 69  (09/16/2008)    SGOT (AST): 44  (09/16/2008)   BMP action: Ordered   SGPT (ALT): 19  (09/16/2008) CMP ordered    Alkaline phosphatase: 75  (09/16/2008)   Total bilirubin: 0.4  (09/16/2008)    Lipid flowsheet reviewed?: Yes   Progress toward LDL goal: Unchanged  Hypertension   Last Blood Pressure: 108 / 69  (07/05/2009)   Serum creatinine: 0.80  (09/16/2008)   BMP action: Ordered   Serum potassium 3.7  (09/16/2008) CMP ordered     Hypertension flowsheet reviewed?: Yes   Progress toward BP goal: At goal  Self-Management Support :   Personal Goals (by the next clinic visit) :     Personal A1C goal: 8  (07/05/2009)     Personal blood pressure goal: 140/90  (07/05/2009)     Personal LDL goal: 100  (07/05/2009)    Patient will work on the following items until the next clinic visit to reach self-care goals:     Medications and monitoring: take my medicines every day, check my blood sugar, check my blood pressure, bring all of my medications to every visit  (07/05/2009)     Eating: drink diet soda or water instead of juice or soda, eat more vegetables, use fresh or frozen vegetables, eat foods that are low in salt, eat baked foods instead of fried foods, eat fruit for snacks and desserts, limit or avoid alcohol  (07/05/2009)    Diabetes self-management support: CBG self-monitoring log, Written self-care plan, Education handout  (07/05/2009)   Diabetes care plan printed   Diabetes education handout printed    Hypertension self-management support: CBG self-monitoring log, Written self-care plan  (07/05/2009)   Hypertension self-care plan printed.    Lipid self-management support: Written self-care plan  (07/05/2009)   Lipid self-care plan printed.   Nursing Instructions: Diabetic foot exam  today Refer for screening diabetic eye exam (see order)     Diabetic Foot Exam Foot Inspection Is there a history of a foot ulcer?              No Is there a foot ulcer now?              No Can the patient see the bottom of their feet?          Yes Are the shoes appropriate in style and fit?          Yes Is there swelling or an abnormal foot shape?          No Are the toenails long?                No Are the toenails thick?                No Are the toenails ingrown?              No Is there heavy callous build-up?  No Is there pain in the calf muscle (Intermittent claudication) when walking?    NoIs there a claw toe deformity?              No Is there elevated skin temperature?            No Is there limited ankle dorsiflexion?            No Is there foot or ankle muscle weakness?            No  Diabetic Foot Care Education Patient educated on appropriate care of diabetic feet.     10-g (5.07) Semmes-Weinstein Monofilament Test           Right Foot          Left Foot Visual Inspection     normal           normal Site 1         abnormal         normal Site 2         abnormal         normal Site 3         abnormal         normal Site 4         abnormal         normal Site 5         abnormal         abnormal Site 6         normal         normal

## 2010-05-31 NOTE — Progress Notes (Signed)
Summary: refill  Phone Note Refill Request Call back at [phone] Message from:  Patient  Refills Requested: Medication #1:  METFORMIN HCL 1000 MG TABS Take 1 tablet by mouth twice a day  Medication #2:  LISINOPRIL-HYDROCHLOROTHIAZIDE 20-25 MG TABS one tablet by mouth daily  Medication #3:  FERROUS SULFATE 324 MG TBEC one tablet by mouth two times a day  Medication #4:  GLIPIZIDE XL 10 MG XR24H-TAB 1 tab by mouth once daily. [facility] [address]  Next Appointment Scheduled: 03/30/10 Initial call taken by: De Nurse,  March 12, 2010 10:57 AM    Prescriptions: FERROUS SULFATE 324 MG TBEC (FERROUS SULFATE) one tablet by mouth two times a day  #40 x 0   Entered by:   Theresia Lo RN   Authorized by:   Lloyd Huger MD   Signed by:   Theresia Lo RN on 03/12/2010   Method used:   [facility]        [facility]  [address] [phone]* (retail)       [address]       [place], [place]  [address]       Ph: [phone] or [phone]       Fax: [phone]   RxID:   [id number]  Dr.  [place] ok'd refill on this medication.  patient has appointment 03/30/2010 Theresia Lo RN  March 12, 2010 12:42 PM

## 2010-05-31 NOTE — Progress Notes (Signed)
Summary: Pt. No Showed with Dr. Dione Booze  Received a fax today letting us know pt. no showed her appoinment with Dr. Dione Booze on 02/27/2010 for her Diabetic Eye Exam.   .....Marland KitchenMarland KitchenTerese Door  March 14, 2010 4:05 PM

## 2010-05-31 NOTE — Assessment & Plan Note (Signed)
Summary: f/u anemia and skin lesion   Vital Signs:  Patient profile:   40 year old female Height:      69 inches Weight:      147.4 pounds BMI:     21.85 Temp:     98.1 degrees F oral Pulse rate:   72 / minute BP sitting:   135 / 85  (left arm) Cuff size:   regular  Vitals Entered By: Gladstone Pih (October 11, 2009 9:46 AM) CC: f/u labs Is Patient Diabetic? Yes Pain Assessment Patient in pain? no      Comments has not had apt with Dr Raymondo Band, he does take her ins   Primary Care Provider:  Marisue Ivan  MD  CC:  f/u labs.  History of Present Illness: 40yo F here for f/u recent labs  Anemia: Pt unaware of prior anemia.  Denies any weakness, SOB, palptations, syncopal events, or heavy menstrual periods.  States that she does not remember discussing her labs from 06/2009 and has yet to pick up the iron supplements.  Skin lesion: Still curious as what to do about her skin lesions.  No pain.  Intermittently pruritic.  Diffusely distributed.  Habits & Providers  Alcohol-Tobacco-Diet     Tobacco Status: current     Tobacco Counseling: to quit use of tobacco products     Cigarette Packs/Day: 0.5  Current Medications (verified): 1)  Glipizide 10 Mg Tb24 (Glipizide) .... Take 1 Tablet By Mouth Once A Day 2)  Lantus 100 Unit/ml Soln (Insulin Glargine) .... Inject 12 Unit Subcutaneously Once A Day 3)  Lisinopril-Hydrochlorothiazide 20-25 Mg Tabs (Lisinopril-Hydrochlorothiazide) .... One Tablet By Mouth Daily 4)  Metformin Hcl 1000 Mg Tabs (Metformin Hcl) .... Take 1 Tablet By Mouth Twice A Day 5)  Simvastatin 20 Mg Tabs (Simvastatin) .Marland Kitchen.. 1 Tab By Mouth At Bedtime. 6)  Prodigy Blood Glucose Test  Strp (Glucose Blood) .... Use Up To 3 Times A Day To Test Blood Sugar 7)  Prodigy Twist Top Lancets 28g  Misc (Lancets) .... Use Up To 3 Times A Day To Check Blood Sugars 8)  Ferrous Sulfate 324 Mg Tbec (Ferrous Sulfate) .... One Tablet By Mouth Two Times A Day 9)  Prodigy Autocode  Blood Glucose  Devi (Blood Glucose Monitoring Suppl) .... Check Blood Sugar Up To 3 Times A Day  Allergies (verified): No Known Drug Allergies  Review of Systems      See HPI  Physical Exam  General:  VS Reviewed. Well appearing, NAD.  Lungs:  Normal respiratory effort, chest expands symmetrically. Lungs are clear to auscultation, no crackles or wheezes. Heart:  Normal rate and regular rhythm. S1 and S2 normal without gallop, murmur, click, rub or other extra sounds. Skin:  hyperpigmented annular scaly dry skin lesion diffusely distributed   Impression & Recommendations:  Problem # 1:  ANEMIA (ICD-285.9) Assessment Unchanged  Labs c/w iron deficient anemia.  Peripheral smear c/w microcytic anemia.  Low ferritin confirms dx. She remains asymptomatic.   Will have her pick up the ferrous sulfate today and f/u in 3 months to reassess CBC.  Her updated medication list for this problem includes:    Ferrous Sulfate 324 Mg Tbec (Ferrous sulfate) ..... One tablet by mouth two times a day  Orders: [facility]- Est Level  3 ([address])  Problem # 2:  SKIN LESION (ICD-709.9) Assessment: Unchanged  Previous KOH neg. Her hx and exam still concerning for tinea therefore advised to try otc anti-fungal ointment prior to an expensive  biopsy as I think this is not a malignant lesion. If the anti-fungal tx fails, will obtain subsequent biopsy for confirmation of dx.  Orders: [facility]- Est Level  3 ([address])  Complete Medication List: 1)  Glipizide 10 Mg Tb24 (Glipizide) .... Take 1 tablet by mouth once a day 2)  Lantus 100 Unit/ml Soln (Insulin glargine) .... Inject 12 unit subcutaneously once a day 3)  Lisinopril-hydrochlorothiazide 20-25 Mg Tabs (Lisinopril-hydrochlorothiazide) .... One tablet by mouth daily 4)  Metformin Hcl 1000 Mg Tabs (Metformin hcl) .... Take 1 tablet by mouth twice a day 5)  Simvastatin 20 Mg Tabs (Simvastatin) .Marland Kitchen.. 1 tab by mouth at bedtime. 6)  Prodigy Blood Glucose Test Strp  (Glucose blood) .... Use up to 3 times a day to test blood sugar 7)  Prodigy Twist Top Lancets 28g Misc (Lancets) .... Use up to 3 times a day to check blood sugars 8)  Ferrous Sulfate 324 Mg Tbec (Ferrous sulfate) .... One tablet by mouth two times a day 9)  Prodigy Autocode Blood Glucose Devi (Blood glucose monitoring suppl) .... Check blood sugar up to 3 times a day  Patient Instructions: 1)  Make an appt with Dr. Raymondo Band to assess your diabetes. 2)  Pick up your iron pills today...take it twice a day. 3)  If you have any problems, call me. 4)  We'll recheck the blood work in 3 months.

## 2010-05-31 NOTE — Progress Notes (Signed)
Summary: resch  Phone Note Call from Patient   Caller: Patient Summary of Call: car won't run - needs to resch to next week Initial call taken by: De Nurse,  May 30, 2009 2:34 PM  Follow-up for Phone Call        To PCP for his info Follow-up by: Gladstone Pih,  May 30, 2009 2:47 PM  Additional Follow-up for Phone Call Additional follow up Details #1::        will f/u accordingly Additional Follow-up by: Marisue Ivan  MD,  May 30, 2009 4:48 PM

## 2010-05-31 NOTE — Letter (Signed)
Summary: Generic Letter  [facility]  [address]   [place], [place] [address]   Phone: [phone]  Fax: [phone]    10/09/2009  CARELY NAPPIER [address] [place], [place]  [address]  Dear Ms. SAMAD,     I have been unable to contact you by phone. Your doctor , Dr. Burnadette Pop, requested we schedule an appointment for you with an eye doctor. An appointment has been scheduled with Dr. Ernesto Rutherford for December 12, 2009 at 3:15 PM. The address is [address] , [place], [place], phone number is [phone]. If this time is not convenient please call their office to reschedule.      Thank you.        Sincerely,   Theresia Lo RN  Appended Document: Generic Letter spoke with patient by phone and gave information.

## 2010-05-31 NOTE — Letter (Signed)
Summary: Generic Letter  [facility]  [address]   [place], [place] [address]   Phone: [phone]  Fax: [phone]    02/19/2010  LONNI DIRDEN [address] [place], [place]  [address]   To whom it may concern:  Ms. Fern was seen in our office on March 9 and September 27, 2009. Should you require any further information, please contact our office [phone].     Sincerely,   Loralee Pacas CMA

## 2010-05-31 NOTE — Progress Notes (Signed)
Summary: referral  Phone Note Call from Patient Call back at [phone]   Caller: Patient Summary of Call: wants to be referred to Derm about her skin - keeps getting worse Initial call taken by: De Nurse,  October 17, 2009 10:36 AM  Follow-up for Phone Call        will forward to MD. Follow-up by: Theresia Lo RN,  October 17, 2009 11:01 AM  Additional Follow-up for Phone Call Additional follow up Details #1::        derm referral is appropriate if pt has tried and failed otc antifungal medication. Additional Follow-up by: Marisue Ivan  MD,  October 17, 2009 3:20 PM

## 2010-05-31 NOTE — Consult Note (Signed)
Summary: [facility]  [facility]   Imported By: Clydell Hakim 01/24/2010 11:20:09  _____________________________________________________________________  External Attachment:    Type:   Image     Comment:   External Document

## 2010-05-31 NOTE — Assessment & Plan Note (Signed)
Summary: f/u chronic issues   Vital Signs:  Patient profile:   40 year old female Height:      69 inches Weight:      147.4 pounds BMI:     21.85 Temp:     98.0 degrees F oral Pulse rate:   85 / minute BP sitting:   105 / 69  (left arm) Cuff size:   regular  Vitals Entered By: Gladstone Pih (September 27, 2009 10:07 AM) CC: C/O [place] Is Patient Diabetic? Yes Did you bring your meter with you today? Yes Pain Assessment Patient in pain? no        Primary Care Provider:  Marisue Ivan  MD  CC:  C/O werakness.  History of Present Illness: 40yo F c/o N/V and fatigue  N/V and fatigue: Started 2 weeks ago and resolved last week.  Also had diarrhea and resolved.    DM:  Currently on Lantus 12 units qhs (suppose to be on 18units), Metformin, and Glipizide.  No adverse effects.  No hypoglycemic events.  No paresthesia or peripheral nerve pain.  CBGs checked 1 time a day and typically run b/w 70-390.  Due for diabetic eye exam.  HTN: No adverse effects from medication.  Not checking it regularly.  Was well controlled at last visit.  No dizziness, HA, CP, palpitations, or swelling.  HLD: Tolerating medication.  No adverse effects.  No muscle pain or abd pain.  Worsening skin lesion: Pt c/o hyperpigmented, scaly, dry skin lesion that has spread over the past few months on arms, legs, and trunk.  No pain.  Intermittently pruritic.    Preventative: Last pap smear 08/2008 was nl.      Habits & Providers  Alcohol-Tobacco-Diet     Tobacco Status: current     Tobacco Counseling: to quit use of tobacco products     Cigarette Packs/Day: 0.5  Current Medications (verified): 1)  Glipizide 10 Mg Tb24 (Glipizide) .... Take 1 Tablet By Mouth Once A Day 2)  Lantus 100 Unit/ml Soln (Insulin Glargine) .... Inject 12 Unit Subcutaneously Once A Day 3)  Lisinopril-Hydrochlorothiazide 20-25 Mg Tabs (Lisinopril-Hydrochlorothiazide) .... One Tablet By Mouth Daily 4)  Metformin Hcl 1000 Mg Tabs  (Metformin Hcl) .... Take 1 Tablet By Mouth Twice A Day 5)  Simvastatin 20 Mg Tabs (Simvastatin) .Marland Kitchen.. 1 Tab By Mouth At Bedtime. 6)  Prodigy Blood Glucose Test  Strp (Glucose Blood) .... Use Up To 3 Times A Day To Test Blood Sugar 7)  Prodigy Twist Top Lancets 28g  Misc (Lancets) .... Use Up To 3 Times A Day To Check Blood Sugars 8)  Ferrous Sulfate 324 Mg Tbec (Ferrous Sulfate) .... One Tablet By Mouth Two Times A Day 9)  Prodigy Autocode Blood Glucose  Devi (Blood Glucose Monitoring Suppl) .... Check Blood Sugar Up To 3 Times A Day  Allergies (verified): No Known Drug Allergies  Review of Systems      See HPI  Physical Exam  General:  VS Reviewed. Well appearing, NAD.  Lungs:  Normal respiratory effort, chest expands symmetrically. Lungs are clear to auscultation, no crackles or wheezes. Heart:  Normal rate and regular rhythm. S1 and S2 normal without gallop, murmur, click, rub or other extra sounds. Extremities:  no edema Neurologic:  no focal deficits Skin:  hyperpigmented annular scaly dry skin lesion diffusely distributed   Impression & Recommendations:  Problem # 1:  DIABETES MELLITUS, I (ICD-250.01) Assessment Improved A1c drastically improved from previous visit (11% -> 7.8%)  However, her CBGs seem to fluctuate.   Plan to have her see Dr. Raymondo Band in diabetes clinic to discuss different regimen...consider adding short acting and removing the glipizide.  Her updated medication list for this problem includes:    Glipizide 10 Mg Tb24 (Glipizide) .Marland Kitchen... Take 1 tablet by mouth once a day    Lantus 100 Unit/ml Soln (Insulin glargine) ..... Inject 12 unit subcutaneously once a day    Lisinopril-hydrochlorothiazide 20-25 Mg Tabs (Lisinopril-hydrochlorothiazide) ..... One tablet by mouth daily    Metformin Hcl 1000 Mg Tabs (Metformin hcl) .Marland Kitchen... Take 1 tablet by mouth twice a day  Orders: A1C-[facility] (81191) Ophthalmology Referral (Ophthalmology) [facility]- Est  Level 4 (47829)  Problem #  2:  SKIN LESION (ICD-709.9) Assessment: New Benign appearing and diffusely distributed. KOH obtained to r/o fungal etiology.  Orders: KOH-[facility] (56213) [facility]- Est  Level 4 (08657)  Problem # 3:  HYPERLIPIDEMIA (ICD-272.4) Assessment: Improved  At goal <100. No changes to regimen.  Her updated medication list for this problem includes:    Simvastatin 20 Mg Tabs (Simvastatin) .Marland Kitchen... 1 tab by mouth at bedtime.  Orders: [facility]- Est  Level 4 (84696)  Problem # 4:  HYPERTENSION, BENIGN SYSTEMIC (ICD-401.1) Assessment: Unchanged  At goal (<140/90). No changes to regimen.  Her updated medication list for this problem includes:    Lisinopril-hydrochlorothiazide 20-25 Mg Tabs (Lisinopril-hydrochlorothiazide) ..... One tablet by mouth daily  Orders: [facility]- Est  Level 4 (29528)  Problem # 5:  ANEMIA (ICD-285.9) Assessment: Comment Only Reassess CBC s/p starting ferrous sulfate.  Her updated medication list for this problem includes:    Ferrous Sulfate 324 Mg Tbec (Ferrous sulfate) ..... One tablet by mouth two times a day  Orders: CBC w/Diff-[facility] (41324)  Problem # 6:  TOBACCO USER (ICD-305.1) Assessment: Unchanged 1/2 ppd.  Contemplative stage of quitting.  Counseling provided.  Problem # 7:  VIRAL GASTROENTERITIS (ICD-008.8) Assessment: Improved Resolved.  Complete Medication List: 1)  Glipizide 10 Mg Tb24 (Glipizide) .... Take 1 tablet by mouth once a day 2)  Lantus 100 Unit/ml Soln (Insulin glargine) .... Inject 12 unit subcutaneously once a day 3)  Lisinopril-hydrochlorothiazide 20-25 Mg Tabs (Lisinopril-hydrochlorothiazide) .... One tablet by mouth daily 4)  Metformin Hcl 1000 Mg Tabs (Metformin hcl) .... Take 1 tablet by mouth twice a day 5)  Simvastatin 20 Mg Tabs (Simvastatin) .Marland Kitchen.. 1 tab by mouth at bedtime. 6)  Prodigy Blood Glucose Test Strp (Glucose blood) .... Use up to 3 times a day to test blood sugar 7)  Prodigy Twist Top Lancets 28g Misc (Lancets) .... Use up to 3  times a day to check blood sugars 8)  Ferrous Sulfate 324 Mg Tbec (Ferrous sulfate) .... One tablet by mouth two times a day 9)  Prodigy Autocode Blood Glucose Devi (Blood glucose monitoring suppl) .... Check blood sugar up to 3 times a day  Patient Instructions: 1)  Schedule a visit at the pharmacy diabetes clinic with Dr. Raymondo Band in the next 1-2 weeks to discuss diabetic regimen. 2)  I'll call you with the lab results. 3)  Please quit smoking. Prescriptions: SIMVASTATIN 20 MG TABS (SIMVASTATIN) 1 tab by mouth at bedtime.  #90 x 1   Entered and Authorized by:   Marisue Ivan  MD   Signed by:   Marisue Ivan  MD on 09/27/2009   Method used:   Electronically to        [facility]  [address] [phone]* (retail)       [address]       [place], [place]  [address]       Ph: [phone] or [phone]       Fax: [phone]   RxID:   [id number] GLIPIZIDE 10 MG TB24 (GLIPIZIDE) Take 1 tablet by mouth once a day  #90 x 1   Entered and Authorized by:   Marisue Ivan  MD   Signed by:   Marisue Ivan  MD on 09/27/2009   Method used:   [facility]        [facility]  [address] [phone]* (retail)       [address]       [place], [place]  [address]       Ph: [phone] or [phone]       Fax: [phone]   RxID:   [id number] METFORMIN HCL 1000 MG TABS (METFORMIN HCL) Take 1 tablet by mouth twice a day  #180 x 1   Entered and Authorized by:   Marisue Ivan  MD   Signed by:   Marisue Ivan  MD on 09/27/2009   Method used:   [facility]        [facility]  [address] [phone]* (retail)       [address]       [place], [place]  [id number]       Ph: [phone] or [phone]       Fax: [phone]   RxID:   [id number] LISINOPRIL-HYDROCHLOROTHIAZIDE 20-25 MG TABS (LISINOPRIL-HYDROCHLOROTHIAZIDE) one tablet by mouth daily  #90 x 1   Entered and Authorized by:    Marisue Ivan  MD   Signed by:   Marisue Ivan  MD on 09/27/2009   Method used:   [facility] to        [facility]  [address] [phone]* (retail)       [address]       [place], [place]  [address]       Ph: [phone] or [phone]       Fax: [phone]   RxID:   [id number]   Laboratory Results   Blood Tests   Date/Time Received: September 27, 2009 10:15 AM  Date/Time Reported: September 27, 2009 10:31 AM   HGBA1C: 7.8%   (Normal Range: Non-Diabetic - 3-6%   Control Diabetic - 6-8%)  Comments: ...............test performed by......Marland KitchenBonnie A. [place], MLS (ASCP)cm      Prevention & Chronic Care Immunizations   Influenza vaccine: Not documented   Influenza vaccine deferral: Refused  (07/05/2009)    Tetanus booster: 04/29/2002: Done.    Pneumococcal vaccine: Not documented  Other Screening   Pap smear: NEGATIVE FOR INTRAEPITHELIAL LESIONS OR MALIGNANCY.  (09/12/2008)   Smoking status: current  (09/27/2009)   Smoking cessation counseling: yes  (09/09/2007)  Diabetes Mellitus   HgbA1C: 7.8  (09/27/2009)    Eye exam: Not documented   Diabetic eye exam action/deferral: Ophthalmology referral  (09/27/2009)    Foot exam: yes  (09/09/2007)   Foot exam action/deferral: Do today   High risk foot: Not documented   Foot care education: Done  (07/05/2009)    Urine microalbumin/creatinine ratio: Not documented    Diabetes flowsheet reviewed?: Yes   Progress toward A1C goal: Improved  Lipids   Total Cholesterol: 134  (09/16/2008)  Lipid panel action/deferral: LDL Direct Ordered   LDL: 43  (09/16/2008)   LDL Direct: 52  (07/05/2009)   HDL: 77  (09/16/2008)   Triglycerides: 69  (09/16/2008)    SGOT (AST): 23  (07/05/2009)   BMP action: Ordered   SGPT (ALT): 19  (07/05/2009)   Alkaline phosphatase: 87  (07/05/2009)   Total bilirubin: 0.2  (07/05/2009)    Lipid flowsheet reviewed?: Yes   Progress toward LDL goal: At  goal  Hypertension   Last Blood Pressure: 105 / 69  (09/27/2009)   Serum creatinine: 0.70  (07/05/2009)   BMP action: Ordered   Serum potassium 4.4  (07/05/2009)    Hypertension flowsheet reviewed?: Yes   Progress toward BP goal: At goal  Self-Management Support :   Personal Goals (by the next clinic visit) :     Personal A1C goal: 8  (07/05/2009)     Personal blood pressure goal: 140/90  (07/05/2009)     Personal LDL goal: 100  (07/05/2009)    Patient will work on the following items until the next clinic visit to reach self-care goals:     Medications and monitoring: take my medicines every day, check my blood sugar, check my blood pressure, bring all of my medications to every visit  (09/27/2009)     Eating: drink diet soda or water instead of juice or soda, eat more vegetables, use fresh or frozen vegetables, eat foods that are low in salt, eat baked foods instead of fried foods, eat fruit for snacks and desserts, limit or avoid alcohol  (09/27/2009)    Diabetes self-management support: Written self-care plan, Education handout  (09/27/2009)   Diabetes care plan printed   Diabetes education handout printed    Hypertension self-management support: Written self-care plan, Education handout  (09/27/2009)   Hypertension self-care plan printed.   Hypertension education handout printed    Lipid self-management support: Written self-care plan, Education handout  (09/27/2009)   Lipid self-care plan printed.   Lipid education handout printed   Nursing Instructions: Diabetic foot exam today Refer for screening diabetic eye exam (see order)     Appended Document: KOH  negative    Lab Visit  Laboratory Results  Date/Time Received: September 27, 2009 11:00 AM  Date/Time Reported: September 27, 2009 11:29 AM   Other Tests  Skin KOH: Negative Comments: ...............test performed by......Marland KitchenBonnie A. [place], MLS (ASCP)cm    Orders Today:  Reviewed.  Will call pt with results  once the CBC is back........Marland KitchenMarisue Ivan, MD

## 2010-06-23 ENCOUNTER — Other Ambulatory Visit: Payer: Self-pay | Admitting: Family Medicine

## 2010-07-22 LAB — URINALYSIS, ROUTINE W REFLEX MICROSCOPIC
Glucose, UA: >1000 mg/dL — AB
Nitrite: NEGATIVE
Specific Gravity, Urine: 1.03 (ref 1.005–1.030)
pH: 7.5 (ref 5.0–8.0)

## 2010-07-22 LAB — URINE MICROSCOPIC-ADD ON

## 2010-07-22 LAB — DIFFERENTIAL
Basophils Absolute: 0 10*3/uL (ref 0.0–0.1)
Basophils Relative: 1 % (ref 0–1)
Lymphocytes Relative: 25 % (ref 12–46)
Monocytes Absolute: 0.2 10*3/uL (ref 0.1–1.0)
Neutro Abs: 4.2 10*3/uL (ref 1.7–7.7)

## 2010-07-22 LAB — POCT I-STAT, CHEM 8
Creatinine, Ser: 0.6 mg/dL (ref 0.4–1.2)
HCT: 40 % (ref 36.0–46.0)
Hemoglobin: 13.6 g/dL (ref 12.0–15.0)
Potassium: 3.7 mEq/L (ref 3.5–5.1)
Sodium: 133 mEq/L — ABNORMAL LOW (ref 135–145)

## 2010-07-22 LAB — CBC
MCHC: 33.1 g/dL (ref 30.0–36.0)
Platelets: 118 10*3/uL — ABNORMAL LOW (ref 150–400)
RDW: 15.9 % — ABNORMAL HIGH (ref 11.5–15.5)

## 2010-07-22 LAB — GLUCOSE, CAPILLARY: Glucose-Capillary: 111 mg/dL — ABNORMAL HIGH (ref 70–99)

## 2010-07-22 LAB — POCT PREGNANCY, URINE: Preg Test, Ur: NEGATIVE

## 2010-07-27 ENCOUNTER — Ambulatory Visit: Payer: Self-pay | Admitting: Family Medicine

## 2010-08-07 ENCOUNTER — Encounter: Payer: Self-pay | Admitting: Family Medicine

## 2010-08-07 ENCOUNTER — Ambulatory Visit (INDEPENDENT_AMBULATORY_CARE_PROVIDER_SITE_OTHER): Payer: Medicaid Other | Admitting: Family Medicine

## 2010-08-07 VITALS — BP 161/94 | HR 77 | Temp 98.0°F | Ht 68.6 in | Wt 160.7 lb

## 2010-08-07 DIAGNOSIS — E109 Type 1 diabetes mellitus without complications: Secondary | ICD-10-CM

## 2010-08-07 DIAGNOSIS — I10 Essential (primary) hypertension: Secondary | ICD-10-CM

## 2010-08-07 DIAGNOSIS — E785 Hyperlipidemia, unspecified: Secondary | ICD-10-CM

## 2010-08-07 LAB — POCT GLYCOSYLATED HEMOGLOBIN (HGB A1C): Hemoglobin A1C: 7.3

## 2010-08-07 MED ORDER — GLIPIZIDE 10 MG PO TABS: 10.0000 mg | ORAL_TABLET | Freq: Every day | ORAL | Status: DC

## 2010-08-07 MED ORDER — METFORMIN HCL 1000 MG PO TABS: 1000.0000 mg | ORAL_TABLET | Freq: Two times a day (BID) | ORAL | Status: DC

## 2010-08-07 MED ORDER — SIMVASTATIN 20 MG PO TABS: 20.0000 mg | ORAL_TABLET | Freq: Every evening | ORAL | Status: DC

## 2010-08-07 MED ORDER — INSULIN GLARGINE 100 UNIT/ML ~~LOC~~ SOLN: 12.0000 [IU] | Freq: Every day | SUBCUTANEOUS | Status: DC

## 2010-08-07 MED ORDER — LISINOPRIL-HYDROCHLOROTHIAZIDE 20-12.5 MG PO TABS: 1.0000 | ORAL_TABLET | Freq: Every day | ORAL | Status: DC

## 2010-08-07 MED ORDER — FERROUS SULFATE 325 (65 FE) MG PO TABS: 325.0000 mg | ORAL_TABLET | Freq: Every day | ORAL | Status: DC

## 2010-08-07 NOTE — Patient Instructions (Addendum)
It was great to see you today! I would like you to start checking your blood sugar at least one time daily.  I would prefer in the morning and before dinner.  Please keep a log of this and bring it in with you to your next visit. I would also like you to do some thinking about your smoking.  We will talk about that at a later time. Your blood pressure is a little high today.  We will recheck it at your next appointment.  If still high then, we will talk about changing your medicine. I would like you to come in for some blood work before your next appointment.  You will need to be fasting for this.  Make an appointment for this on your way out.

## 2010-08-08 ENCOUNTER — Encounter: Payer: Self-pay | Admitting: Family Medicine

## 2010-08-14 NOTE — Assessment & Plan Note (Signed)
The patient's blood pressure started to be significantly above goal. She has not been seen in this clinic for some time, and has not regularly check her blood pressure. Patient is instructed to check blood pressure at least once a week as an outpatient, and bringing these readings with her to her following appointment. At this time, if her blood pressure is still elevated, we will plan to adjust her medications.

## 2010-08-14 NOTE — Assessment & Plan Note (Signed)
The patient reports that her diabetes is adult onset, not juvenile onset. Considering the medications that she is on, I feel that she most likely is a type II diabetic. Will continue to provide medication for her, and encourage routine checking of blood sugar. The patient is instructed to keep a log of blood sugars and return to clinic in approximately one month to discuss any necessary changes in her medications. A1 C will also be obtained today.

## 2010-08-14 NOTE — Assessment & Plan Note (Signed)
The patient does not have a fasting lipid panel drawn in some time. Will order one to be performed prior to her next visit. No current size of statin toxicity.

## 2010-08-14 NOTE — Progress Notes (Signed)
Subjective: the patient presents today for follow-up of her diabetes.  She claims medication compliance, but rarely checks her blood sugar. She reports no episodes of hypoglycemia, and does not report significant issues with either polyurea or polydipsia. She reports no visual changes, no chest pain, shortness of breath, myalgias, urinary or gastrointestinal complaints. She requests refills on her medications.  Past Medical History:  Tobacco use  HTN  DM I   Past Surgical History:  BTL - 01/27/2004   Family History:  Father- unk  Mother- HTN  Brother- healthy   Social History:  Lives in [place] w/ 3 kids. Mother of fraternal twins (female/female).  Employed as Engineer, agricultural.    No reg exercise    Objective:  Filed Vitals:   08/07/10 1404  BP: 161/94  Pulse: 77  Temp: 98 F (36.7 C)   Gen: NAD, alert, cooperative with exam CV: RRR, no MRG Resp: CTABL, no wheezes noted Abd: SNTND, BS present, no guarding or organomegaly Ext: No edema noted, full ROM, sensation intact

## 2010-09-14 NOTE — Discharge Summary (Signed)
NAMEMARJORIA, [name]                ACCOUNT NO.:  [id number]   MEDICAL RECORD NO.:  [id number]          PATIENT TYPE:  INP   LOCATION:  5511                         FACILITY:  [facility]   PHYSICIAN:  Asencion Partridge, M.D.     DATE OF BIRTH:  10/12/1970   DATE OF ADMISSION:  01/11/2004  DATE OF DISCHARGE:  01/13/2004                                 DISCHARGE SUMMARY   DISCHARGE DIAGNOSES:  1.  Hyperglycemia.  2.  Diabetic gastroparesis.  3.  Hypertension.  4.  Skin cut.   DISCHARGE MEDICATIONS:  1.  Lantus insulin 30 units at bedtime.  2.  Novolog insulin 12 units with each meal.  3.  Hydrochlorothiazide 25 mg daily.  4.  Lisinopril 10 mg daily.  5.  Metoclopramide 10 mg before each meal for 2 weeks.  6.  Bacitracin & polymixin ointment b.i.d. in the skin cut over the left      fifth toe.   DISCHARGE INSTRUCTIONS:  1.  Follow low-sodium, low-fat, 1800 carbohydrate-modified diet.  2.  Advance diet slowly as tolerated.   HOSPITAL COURSE:  This is a 40 year old female diabetic patient that was  admitted for recurrent episodes of abdominal pain, emesis, and diarrhea.  At  the time of admission, she was found to be mildly dehydrated, and also her  vital signs showed temperature 97.9, heart rate 107, respiratory rate 16,  blood pressure 179/81, second blood pressure reading 193/98.  The patient  had a 98% O2 saturation on room air.  She appeared fatigued and noted to be  hyperglycemic.  Serum glucose was 269.  For other details, please see H&P.   ADMISSION LABORATORY AND X-RAY DATA:  White blood cells 8.9, hemoglobin 13,  platelets 246. Sodium 135, potassium 3.3, chloride 103.  Urea 23, pH 7.45.  The patient had a urinalysis showing over 1000 glucosuria and 80 ketones,  negative for nitrites and negative for leukocyte esterase.   HOSPITAL COURSE:  #1.  HYPERGLYCEMIA:  Patient reported to not be consistent with her  treatment at home.  She was put back on home regimen with Lantus 30  units  q.h.s. and Humalog 10 units with each meal.  We also provided IV fluids and  potassium supplement.  Her glucose level improved from admission.  At the  day of discharge, a.m. CBG was 91, hemoglobin A1C 8.7, and patient was  discharged home with prescription for Lantus 3 units q.h.s. and Novolog  insulin 12 units with each meal.   #2.  EMESIS:  Patient was reported to have episodes of emesis and epigastric  pain persistent and recurrent for seven months.  She also presented with  diarrhea.  She was initially treated with Phenergan and IV fluids, and we  considered the possibility of diabetic gastroparesis.  The patient was then  started on Reglan.  She was much improved, and on the day of discharge, she  was tolerating a bland diet and was no nauseous and did not have any emesis  on the day of discharge.  She still had some mild epigastric discomfort and  was advised  to continue the treatment with Reglan 10 mg before each meal for  two more weeks, to advance diet as tolerated, and to keep a tighter control  of her diabetes to decrease the possibility of recurrence of gastroparesis  disorder.   #3.  HIGH BLOOD PRESSURE:  It was poorly controlled during admission.  We  kept patient on current regimen with Lisinopril, and we added  hydrochlorothiazide 25 mg once a day.  The patient;s blood pressure was  still not completely controlled.  She probably will need addition of a third  agent.  Her blood pressure on the day of discharge was 146/88, but she had a  blood pressure of 168/93 the night before discharge.   #4.  SKIN CUT:  The patient presented with a lesion on her left fifth toe.  She reported that she harmed her toe with a cardboard and was treated with  polymixin cream, and it was much improved at the time of discharge.  The  patient was advised to continue using the polymixin cream.   #5.  HEALTH MANAGEMENT:  The patient had a tobacco cessation consult during  her admission.   She also was given a pneumococcal vaccine before discharge.       AM/MEDQ  D:  03/09/2004  T:  03/10/2004  Job:  [id number]

## 2010-09-14 NOTE — H&P (Signed)
NAME:  Christine [name], Christine [name]                          ACCOUNT NO.:  [id number]   MEDICAL RECORD NO.:  [id number]                   PATIENT TYPE:  INP   LOCATION:  5511                                 FACILITY:  [facility]   PHYSICIAN:  Asencion Partridge, M.D.                  DATE OF BIRTH:  1970/06/16   DATE OF ADMISSION:  01/11/2004  DATE OF DISCHARGE:                                HISTORY & PHYSICAL   CHIEF COMPLAINT:  Nausea and vomiting.   HISTORY OF PRESENT ILLNESS:  The patient says that starting yesterday she  noticed some nausea and eventually vomiting.  She says that over the weekend  she had gone to several cook-outs and the following day had diarrhea and the  day after that began feeling nauseated and vomiting.  The diarrhea lasted  approximately one day and has now resolved.  The patient says that she has  had a similar episode to this approximately four to five months ago and  attributed that to a stomach virus.  She said she went to the emergency  department for that but was not admitted overnight.  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   REVIEW OF SYSTEMS:  CONSTITUTIONAL:  Denies any fever or weight changes.  CARDIOVASCULAR:  No chest pain or palpitations.  RESPIRATORY:  No shortness  of breath.  GASTROINTESTINAL:  Please see above.  SKIN:  No rash.  NEUROLOGIC:  No headache or seizure.  MUSCULOSKELETAL:  Generalized weakness  but no specific weakness.  EYES:  She says she saw spots this morning.  ENT:  No cough, congestion, or sore throat.  GENITOURINARY:  No dysuria or  increased frequency.   PAST MEDICAL HISTORY:  1.  Diabetes mellitus type 2.  2.  History of acute pancreatitis.  3.  Hypertension.   MEDICATIONS:  1.  Humalog 10 units with meals and Lantus 30 units q.h.s.  2.  Lisinopril 10 mg per day.   ALLERGIES:  No known drug allergies.   SOCIAL HISTORY:  She is the mother of fraternal twins and denies any smoking  or alcohol  use.   FAMILY HISTORY:  Noncontributory.   PHYSICAL EXAMINATION:  VITAL SIGNS:  Temperature is 97.9, pulse 107,  respirations 16, and blood pressure 179/81, and 98% on room air.  GENERAL:  The patient appears stated age and has good insight and judgment.  HEENT:  Head is atraumatic, normocephalic.  Extraocular movements are  intact.  Pupils equal, round, and reactive to light.  Conjunctivae and lids  are normal.  External ears appear normal.  TMs are clear.  Oropharynx is  clear.  Her mucous membranes are moist.  She has been eating ice chips.  NECK:  She does have some enlarged submandibular glands but no thyromegaly  and no lymphadenopathy.  CHEST:  Lungs are clear to auscultation bilaterally.  CARDIAC:  Regular rate and rhythm with a 2/6 systolic ejection murmur best  heard at the left sternal border and no evidence of edema in the lower  extremities.  Radial pulses are 2+ bilaterally.  Dorsal pedal pulses are 2+  bilaterally.  MUSCULOSKELETAL:  Appears grossly intact and symmetric.  No atrophy of  muscles.  ABDOMEN:  Soft, nontender, benign, no masses and no tenderness to palpation.  SKIN:  No rash.  NEUROLOGIC:  Cranial nerves II-XII intact and gross motor intact.   LABORATORY DATA:  White count is 8.9, hemoglobin 13, platelets 246.  On I-  STAT her potassium was 3.3, sodium was 135, chloride 103, BUN 23, and a  glucose of 269.  Her pH was 7.45 by I-STAT.  Her UA showed specific gravity  greater than 1.04, greater than 1000 glucose, greater than 80 ketones, but  was negative for leukocyte esterase and nitrite.  She did have 3-6 white  blood cells.  Her urine pregnancy was negative.  Her lipase was 15.   ASSESSMENT AND PLAN:  A 40 year old with type 2 diabetes with question of  compliance.   1.  Diabetes with hyperglycemia.  We will restart her Lantus and Humalog and      rehydrate her and check a hemoglobin A1C.  2.  Nausea and vomiting, unclear etiology, but with food from  several cook-      outs and exposure from the nursing home, suspect that this is probably      more viral in etiology and may be a gastroenteritis.  Her white count is      normal and she has no fever, and her belly is nontender; thus, serious      bacterial infection is much less likely.  3.  Hypertension.  Does not appear to be well-controlled today.  May be      secondary to her nausea and discomfort.  Will add hydrochlorothiazide to      her regimen.  4.  Hypokalemia by I-STAT.  Will recheck this on a BMET and if still low,      will replace.      Nani Gasser, M.D.                  Asencion Partridge, M.D.    CM/MEDQ  D:  01/13/2004  T:  01/13/2004  Job:  [id number]

## 2010-09-18 ENCOUNTER — Ambulatory Visit: Payer: Medicaid Other | Admitting: Family Medicine

## 2010-11-13 ENCOUNTER — Encounter: Payer: Medicaid Other | Admitting: Family Medicine

## 2010-11-20 ENCOUNTER — Encounter: Payer: Self-pay | Admitting: Family Medicine

## 2010-11-20 ENCOUNTER — Telehealth: Payer: Self-pay | Admitting: Family Medicine

## 2010-11-20 ENCOUNTER — Other Ambulatory Visit: Payer: Self-pay | Admitting: Family Medicine

## 2010-11-20 ENCOUNTER — Ambulatory Visit (INDEPENDENT_AMBULATORY_CARE_PROVIDER_SITE_OTHER): Payer: Self-pay | Admitting: Family Medicine

## 2010-11-20 VITALS — BP 152/88 | Temp 97.9°F | Ht 69.0 in | Wt 154.0 lb

## 2010-11-20 DIAGNOSIS — R21 Rash and other nonspecific skin eruption: Secondary | ICD-10-CM

## 2010-11-20 MED ORDER — TRIAMCINOLONE ACETONIDE 0.5 % EX OINT: TOPICAL_OINTMENT | Freq: Two times a day (BID) | CUTANEOUS | Status: DC

## 2010-11-20 MED ORDER — PREDNISONE 50 MG PO TABS: 50.0000 mg | ORAL_TABLET | Freq: Every day | ORAL | Status: AC

## 2010-11-20 NOTE — Progress Notes (Signed)
Christine [name] presents to clinic today with 3 weeks of generalized pruritic rash. This rash occurs in small patches across her trunk and extremities. It spares her face. She denies any systemic signs or symptoms such are fever, chills, abdominal pain, changes in urine.  She has tried to treat this rash with calamine lotion. She notes that years ago she had a similar but much small rash that was biopsied and found to be dermatitis.   PMH reviewed.  ROS as above otherwise neg  Exam:  Vs noted.  Gen: Well NAD HEENT: MMM no oral lesions Lungs: CTABL Nl WOB Heart: RRR no MRG Abd: NABS, NT, ND Exts: Non edematous BL  LE Skin: Multiple plaques most dime sized with irregular borders. No scale noted. Most excoriated. Some ulcerated as noted in the attached image. Distribution is across trunk and extremities. Spares the face. Hands have skin peeling.       Skin Punch Biopsy:  Right upper lateral thigh. Consent obtained and time out performed. Area cleaned with alcohol and 0.5 ml of 1% lidocaine with epi injected in the skin. Then area cleaned with betadine.  Then a 5 ml punch biopsy used to remove a portion of the lesion with a border or normal appearing skin. The circle of skin was then undermined with scissors and removed with forceps. A dressing was applied. No bleeding, and the patient did well.

## 2010-11-20 NOTE — Telephone Encounter (Signed)
[facility] called about this earlier today.  They cannot get the triamcinolone 0.5% ointment but can get 0.1 % cream. Paged Dr. Denyse Amass and he said it would be fine to switch to that.  Tried to call [facility] back but was unable to speak with anyone. Left a message on their voicemail. Called patient and advised of all this and told her to check at pharmacy .

## 2010-11-20 NOTE — Telephone Encounter (Signed)
Was prescrived an ointment when she was in earlier but [facility]'t [facility] that kind of [facility].  They were supposed to call to get something different, but she hasn't heard anything.

## 2010-11-20 NOTE — Patient Instructions (Signed)
Thank you for coming in today. We will get some labs today. I will call you if they are abnormal.  Take the pills daily for 5 days. Come back if not better in 1 week.  Use the ointment twice a day until you skin starts to look normal. Dont use it on your face.  Make an appointment with your doctor in 2 weeks.

## 2010-11-20 NOTE — Assessment & Plan Note (Signed)
Not sure of the etiology at this time.  However as she denies any systemic symptoms I suspect that she has a primary dermatologic problem and not a more systemic one such as porphyria or lupus.  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 However I will evaluate for more serious or specific processes with: HIV, PRP, and TSH, and a skin punch biopsy.  Will treat with 5 days of prednisone and Triamolone.  Will f/u with pcp in 2 weeks or so. Red flags reviewed with the patient who expresses understanding.

## 2010-11-21 LAB — HIV ANTIBODY (ROUTINE TESTING W REFLEX): HIV: NONREACTIVE

## 2010-11-28 ENCOUNTER — Encounter: Payer: Self-pay | Admitting: Family Medicine

## 2010-11-28 ENCOUNTER — Ambulatory Visit (INDEPENDENT_AMBULATORY_CARE_PROVIDER_SITE_OTHER): Payer: Self-pay | Admitting: Family Medicine

## 2010-11-28 VITALS — BP 128/86 | HR 86 | Temp 97.8°F | Ht 69.0 in | Wt 150.3 lb

## 2010-11-28 DIAGNOSIS — E785 Hyperlipidemia, unspecified: Secondary | ICD-10-CM

## 2010-11-28 DIAGNOSIS — L2089 Other atopic dermatitis: Secondary | ICD-10-CM

## 2010-11-28 DIAGNOSIS — I10 Essential (primary) hypertension: Secondary | ICD-10-CM

## 2010-11-28 DIAGNOSIS — E109 Type 1 diabetes mellitus without complications: Secondary | ICD-10-CM

## 2010-11-28 DIAGNOSIS — R21 Rash and other nonspecific skin eruption: Secondary | ICD-10-CM

## 2010-11-28 DIAGNOSIS — L209 Atopic dermatitis, unspecified: Secondary | ICD-10-CM

## 2010-11-28 MED ORDER — TRIAMCINOLONE ACETONIDE 0.1 % EX CREA: TOPICAL_CREAM | Freq: Two times a day (BID) | CUTANEOUS | Status: DC

## 2010-11-28 NOTE — Progress Notes (Signed)
  Subjective:    Patient ID: Christine [name], female    DOB: 1970-06-02, 40 y.o.   MRN: [id number]  HPI Diabetes:  Currently on Lantus, Metformin, and Glipizide.  However, she has not been taking any of her medications except for Metformin for the past 3 months as she has had no refills.  No adverse effects from medication.  No hypoglycemic events.  No paresthesia or peripheral nerve pain.  Measures blood sugars at home every:  Does NOT measure blood sugars at home.   Lab Results  Component Value Date   HGBA1C 6.9 11/28/2010   Hypertension:  Long-term problem for this patient.  No adverse effects from medication.  Not checking it regularly.  No HA, CP, dizziness, shortness of breath, palpitations, or LE swelling.   BP Readings from Last 3 Encounters:  11/28/10 128/86  11/20/10 152/88  08/07/10 161/94   HLD:  Last lipid panel listed below.    Currently is on Statin.  Denies any myalgias, icterus, jaundice.  Tolerating medications well.   Lab Results  Component Value Date   CHOL 134 09/16/2008   Lab Results  Component Value Date   HDL 77 09/16/2008   Lab Results  Component Value Date   LDLCALC 43 09/16/2008   Lab Results  Component Value Date   TRIG 69 09/16/2008   Lab Results  Component Value Date   CHOLHDL 1.7 Ratio 09/16/2008   Rash:  Present for months (if not years, after examining records).  Biopsy obtained last OV, path report spongiotic dermatitis.  Prescribed Triamcinolone, but small tube, has run out.  Steroid was helping but needs refills.  No fevers, chills, recent illnesses.    Review of Systems See HPI above for review of systems.       Objective:   Physical Exam Gen:  Alert, cooperative patient who appears stated age in no acute distress.  Vital signs reviewed. HEENT:  [place]/AT.  EOMI, PERRL.  MMM, tonsils non-erythematous, non-edematous.  External ears WNL, Bilateral TM's normal without retraction, redness or bulging.  Neck: No masses or thyromegaly or limitation in  range of motion.  No cervical lymphadenopathy. Cardiac:  Regular rate and rhythm without murmur auscultated.  Good S1/S2. Pulm:  Clear to auscultation bilaterally with good air movement.  No wheezes or rales noted.   Abd:  Soft/nondistended/nontender.  Good bowel sounds throughout all four quadrants.  No masses noted.  Ext:  No clubbing/cyanosis/erythema.  No edema noted bilateral lower extremities.   Skin:  Multiple hyper and hypopigmented spots scattered across upper extremities, lower extremities, trunk and back.  Scaly patches also noted        Assessment & Plan:

## 2010-11-28 NOTE — Patient Instructions (Signed)
Come back and see me in 2-3 months.  I would like to look at your blood sugars at that time on just the Metformin.  We may need to add back medicines if they're doing up again. I will send in the Metformin and blood pressure medicine to [facility] on [address].  It will be a 90 day supply. Keep using the Triamcinolone on your rash.  We will keep an eye on it.  Let me know if it's not getting better.   It was good to meet you today. Tell the ladies up front that I will be your new doctor.

## 2010-12-01 DIAGNOSIS — L209 Atopic dermatitis, unspecified: Secondary | ICD-10-CM | POA: Insufficient documentation

## 2010-12-01 NOTE — Assessment & Plan Note (Signed)
Needs lipid panel. Last checked 2010.  No myalgias, abd pain currently.

## 2010-12-01 NOTE — Assessment & Plan Note (Signed)
Question diagnosis.   Has not been on ANY insulin for past 3 months.  A1C 6.9. Will continue Metformin and recheck A1C in 3 months.   History of questionable compliance and highly elevated A1C in past.   Diet recommendations as well as activity encouraged.   Gave strict warnings about signs/symptoms of hyperglycemia/ketoacidosis and need to call clinic/EMS if these present.  Pt expresses understanding.   I will become her PCP as she does not have one and needs chronic followup.

## 2010-12-01 NOTE — Assessment & Plan Note (Signed)
DIagnosis by biopsy. Will refill Triamcinolone.  Discussed hypopigmentation that can occur with steroid use, patient expressed understanding and will proceed with treatment.   Plan to recheck patient after trial of continued steroid cream.   May warrant derm referral,although patient has [employer] and likely cannot pay for appt.

## 2010-12-01 NOTE — Assessment & Plan Note (Signed)
No changes to meds. At goal today.  FU in 3 months.

## 2011-01-29 ENCOUNTER — Ambulatory Visit (INDEPENDENT_AMBULATORY_CARE_PROVIDER_SITE_OTHER): Payer: Self-pay | Admitting: Family Medicine

## 2011-01-29 ENCOUNTER — Encounter: Payer: Self-pay | Admitting: Family Medicine

## 2011-01-29 VITALS — BP 142/82 | HR 88 | Temp 98.3°F | Ht 69.0 in | Wt 153.7 lb

## 2011-01-29 DIAGNOSIS — IMO0002 Reserved for concepts with insufficient information to code with codable children: Secondary | ICD-10-CM

## 2011-01-29 DIAGNOSIS — M792 Neuralgia and neuritis, unspecified: Secondary | ICD-10-CM | POA: Insufficient documentation

## 2011-01-29 DIAGNOSIS — L2089 Other atopic dermatitis: Secondary | ICD-10-CM

## 2011-01-29 DIAGNOSIS — Z111 Encounter for screening for respiratory tuberculosis: Secondary | ICD-10-CM

## 2011-01-29 DIAGNOSIS — L209 Atopic dermatitis, unspecified: Secondary | ICD-10-CM

## 2011-01-29 MED ORDER — GABAPENTIN 100 MG PO CAPS: ORAL_CAPSULE | ORAL | Status: DC

## 2011-01-29 MED ORDER — TUBERCULIN PPD 5 UNIT/0.1ML ID SOLN: 5.0000 [IU] | Freq: Once | INTRADERMAL | Status: DC

## 2011-01-29 MED ORDER — TRIAMCINOLONE ACETONIDE 0.1 % EX CREA: TOPICAL_CREAM | Freq: Two times a day (BID) | CUTANEOUS | Status: DC

## 2011-01-29 NOTE — Progress Notes (Signed)
  Subjective:    Patient ID: Christine [name], female    DOB: February 04, 1971, 40 y.o.   MRN: [id number]  HPI 1.  Paresthesias:  Patient has been suffering from bilateral paresthesias of the past 2-3 months. She describes these as burning sensation on the bottom of her feet. This is aggravated by walking and wearing socks/shoes.  She said has been taking her glipizide as prescribed. Her last A1c was 6.9 and August. She denies any numbness, falls, weakness. No paresthesias in hands or feet.   Review of Systems See HPI above for review of systems.       Objective:   Physical Exam Gen:  Alert, cooperative patient who appears stated age in no acute distress.  Vital signs reviewed. Neuro:  Proprioception intact BL feet and legs.  Monofilament testing reveal diminished sensation only to tips and bottom of toes bilaterally. Otherwise sensation intact throughout bilateral feet and legs. DTRs +2 Achilles and patellar. No change in gait. Musculoskeletal: strength 5 out of 5 bilateral plantar and dorsiflexion.      Assessment & Plan:

## 2011-01-29 NOTE — Assessment & Plan Note (Signed)
Refill triamcinolone today. She states she did not pick up last prescription.

## 2011-01-29 NOTE — Assessment & Plan Note (Signed)
Plan start on Neurontin. She is relatively medication i.e. Plan to start 100 mg nightly for several days and increase to twice a day to increase to 3 times a day. She is to followup in about 4 weeks to assess for improvement. Will adjust the Neurontin at that time.

## 2011-01-31 ENCOUNTER — Ambulatory Visit (INDEPENDENT_AMBULATORY_CARE_PROVIDER_SITE_OTHER): Payer: Self-pay | Admitting: *Deleted

## 2011-01-31 DIAGNOSIS — Z111 Encounter for screening for respiratory tuberculosis: Secondary | ICD-10-CM

## 2011-01-31 DIAGNOSIS — IMO0001 Reserved for inherently not codable concepts without codable children: Secondary | ICD-10-CM

## 2011-01-31 NOTE — Progress Notes (Signed)
PPD negative-0 mm. 

## 2011-02-19 ENCOUNTER — Telehealth: Payer: Self-pay | Admitting: Family Medicine

## 2011-02-19 NOTE — Telephone Encounter (Signed)
Needs refill for Metformin, Glipizide, Lisinopril and the cream for her feet sent to [facility] on [address].  She no longer uses [facility].  She said she was not able to pay for the medicine when it was prescribed earlier, but she now has the money and needs the new Rx's.

## 2011-02-20 ENCOUNTER — Other Ambulatory Visit: Payer: Self-pay | Admitting: Family Medicine

## 2011-02-20 DIAGNOSIS — L209 Atopic dermatitis, unspecified: Secondary | ICD-10-CM

## 2011-02-20 MED ORDER — TRIAMCINOLONE ACETONIDE 0.1 % EX CREA: TOPICAL_CREAM | Freq: Two times a day (BID) | CUTANEOUS | Status: DC

## 2011-02-20 MED ORDER — LISINOPRIL-HYDROCHLOROTHIAZIDE 20-12.5 MG PO TABS: 1.0000 | ORAL_TABLET | Freq: Every day | ORAL | Status: DC

## 2011-02-20 MED ORDER — GLIPIZIDE 10 MG PO TABS: 10.0000 mg | ORAL_TABLET | Freq: Every day | ORAL | Status: DC

## 2011-02-20 MED ORDER — METFORMIN HCL 1000 MG PO TABS: 1000.0000 mg | ORAL_TABLET | Freq: Two times a day (BID) | ORAL | Status: DC

## 2011-02-20 NOTE — Telephone Encounter (Signed)
Done

## 2011-05-08 ENCOUNTER — Encounter: Payer: Self-pay | Admitting: Family Medicine

## 2011-05-08 NOTE — Telephone Encounter (Signed)
This encounter was created in error - please disregard.

## 2011-05-08 NOTE — Telephone Encounter (Signed)
Error

## 2011-05-09 ENCOUNTER — Ambulatory Visit (INDEPENDENT_AMBULATORY_CARE_PROVIDER_SITE_OTHER): Payer: Self-pay | Admitting: Family Medicine

## 2011-05-09 ENCOUNTER — Encounter: Payer: Self-pay | Admitting: Family Medicine

## 2011-05-09 VITALS — BP 109/70 | HR 84 | Temp 98.1°F | Ht 69.0 in | Wt 160.0 lb

## 2011-05-09 DIAGNOSIS — K529 Noninfective gastroenteritis and colitis, unspecified: Secondary | ICD-10-CM | POA: Insufficient documentation

## 2011-05-09 DIAGNOSIS — R197 Diarrhea, unspecified: Secondary | ICD-10-CM

## 2011-05-09 LAB — COMPREHENSIVE METABOLIC PANEL
CO2: 21 mEq/L (ref 19–32)
Calcium: 8.7 mg/dL (ref 8.4–10.5)
Chloride: 107 mEq/L (ref 96–112)
Creat: 0.84 mg/dL (ref 0.50–1.10)
Glucose, Bld: 110 mg/dL — ABNORMAL HIGH (ref 70–99)
Total Bilirubin: 0.4 mg/dL (ref 0.3–1.2)
Total Protein: 6.2 g/dL (ref 6.0–8.3)

## 2011-05-09 LAB — CBC WITH DIFFERENTIAL/PLATELET
Eosinophils Absolute: 0.1 10*3/uL (ref 0.0–0.7)
Eosinophils Relative: 1 % (ref 0–5)
HCT: 34 % — ABNORMAL LOW (ref 36.0–46.0)
Hemoglobin: 10.9 g/dL — ABNORMAL LOW (ref 12.0–15.0)
Lymphs Abs: 1.3 10*3/uL (ref 0.7–4.0)
MCH: 29.6 pg (ref 26.0–34.0)
MCV: 92.4 fL (ref 78.0–100.0)
Monocytes Absolute: 0.4 10*3/uL (ref 0.1–1.0)
Monocytes Relative: 7 % (ref 3–12)
RBC: 3.68 MIL/uL — ABNORMAL LOW (ref 3.87–5.11)

## 2011-05-09 NOTE — Assessment & Plan Note (Addendum)
Chronic diarrhea with hemoccult positive exam today.  Wide differential and difficult to elicit history from patient.  Differential to include neurogenic or pancreatic insufficiency (history of Type 1 DM although PCP questions diagnosis), Osmotic (alcohol- use) vs inflammatory bowel disease (no bloating or weight loss) and malignancy   Will start evaluation today with CBC/diff, CMET, TSH.  Patient is to return a stool sample and will check for stool culture and giardia (works with elderly), fecal lactoferrin, fecal fat  Discussed with patient importance of follow-up as poss ility of cancer.  Will see PCP in 2-3 weeks for review of lab work and further workup and referral as indicated.  Referral complicated by patient's uninsured status.

## 2011-05-09 NOTE — Patient Instructions (Signed)
Will do some blood work today and I want you to bring back a stool sample for testing  Try limiting alcohol and milk to see if it makes a difference  Make a follow-up with PCP in 2-3 weeks to follow-up labwork and symptoms  It is very important that you do not ignore bleeding and diarrhea- can be a sign of more serious condition or even cancer.

## 2011-05-09 NOTE — Progress Notes (Signed)
  Subjective:    Patient ID: Christine [name], female    DOB: 03/20/1971, 41 y.o.   MRN: [id number]  HPI Work in appt for 5-6 months of loose stools with 5 days of blood in stools.  Notes loose stools in past 5-6 months describes as "smelly" "sticky".  In the past 5 days, has consistently seen small amount of red blood in toilet.    Normal pattern is 7-8 loose stools per day, sometimes small volume, and at times finds it difficult to make it to the bathroom at time.  Associated with meals but not any particular food.  No abdominal pain or bloating.  Notes rarely has formed stools.  Frequency occurs at night as well and wakes her up from sleep to go to the bathroom.  Notes no ingestion of sugar alcohols, rare use of dairy products.  Does drink at least 24 ounces of beer every night and 2-3 of these cans on weekend.  No family history of inflammatory  Bowel disease or colon cancer.  Smoker.  No recent abx use, no sick contacts.  NO history of abdominal surgery. Review of SystemsSee HPI    No weight loss, fever, chills nausea, vomiting.  No travel.  Works in a factory and as a Engineer, agricultural.  Objective:   Physical Exam GEN: Alert & Oriented, No acute distress CV:  Regular Rate & Rhythm, no murmur Respiratory:  Normal work of breathing, CTAB Abd:  + BS, soft, no tenderness to palpation Ext: no pre-tibial edema Rectum:  Normal sphincter tone.  No anal fissures.  Hemoccult positive.  Anoscopy shows no significant hemorrhoids.       Assessment & Plan:

## 2011-05-10 ENCOUNTER — Encounter: Payer: Self-pay | Admitting: Family Medicine

## 2011-08-14 ENCOUNTER — Ambulatory Visit: Payer: Self-pay | Admitting: Family Medicine

## 2011-08-21 ENCOUNTER — Encounter: Payer: Self-pay | Admitting: Family Medicine

## 2011-08-21 ENCOUNTER — Ambulatory Visit (INDEPENDENT_AMBULATORY_CARE_PROVIDER_SITE_OTHER): Payer: Self-pay | Admitting: Family Medicine

## 2011-08-21 VITALS — BP 128/86 | HR 86 | Ht 69.0 in | Wt 161.0 lb

## 2011-08-21 DIAGNOSIS — M792 Neuralgia and neuritis, unspecified: Secondary | ICD-10-CM

## 2011-08-21 DIAGNOSIS — E109 Type 1 diabetes mellitus without complications: Secondary | ICD-10-CM

## 2011-08-21 DIAGNOSIS — L2089 Other atopic dermatitis: Secondary | ICD-10-CM

## 2011-08-21 DIAGNOSIS — L209 Atopic dermatitis, unspecified: Secondary | ICD-10-CM

## 2011-08-21 DIAGNOSIS — F17200 Nicotine dependence, unspecified, uncomplicated: Secondary | ICD-10-CM

## 2011-08-21 DIAGNOSIS — IMO0002 Reserved for concepts with insufficient information to code with codable children: Secondary | ICD-10-CM

## 2011-08-21 DIAGNOSIS — E119 Type 2 diabetes mellitus without complications: Secondary | ICD-10-CM

## 2011-08-21 LAB — POCT GLYCOSYLATED HEMOGLOBIN (HGB A1C): Hemoglobin A1C: 7.4

## 2011-08-21 MED ORDER — TRIAMCINOLONE ACETONIDE 0.1 % EX CREA: TOPICAL_CREAM | Freq: Two times a day (BID) | CUTANEOUS | Status: AC

## 2011-08-21 MED ORDER — GABAPENTIN 100 MG PO CAPS: ORAL_CAPSULE | ORAL | Status: DC

## 2011-08-21 NOTE — Patient Instructions (Signed)
Start the Gabapentin 1 pill at night for 5 days.  Then one pill at breakfast and night for 5 days.  Then three pills a day after that.  I'll put in referral for foot doctor and eye doctor.  Someone will call about this.    I'll refill the Triamcinolone.

## 2011-08-21 NOTE — Progress Notes (Signed)
  Subjective:    Patient ID: Christine [name], female    DOB: 1970/07/11, 41 y.o.   MRN: [id number]  HPI 1.  Neuropathic pain:  Feels mostly everyday.  Describes as alternating burning with tingling. Worse after she has been on her feet most of the day but does also experience pain when she first awakens in the morning.Marland Kitchen  She works on her feet most of the day.  Has been trying foot inserts and new tennis shoes without relief.  She was prescribed Neurontin at last office visit in October but states she did not do this and has not started this medication. No weakness bilateral lower extremities. No falls  2.  Diabetes:  Currently on Metformin.     No adverse effects from medication.  No hypoglycemic events.  No paresthesia or peripheral nerve pain.  Measures blood sugars at home every:     Lab Results  Component Value Date   HGBA1C 6.9 11/28/2010   3.  Eczema:  Noted on back of neck and abdomen.  She has been using Triamcinolone with some relief.  Ran out of this several months ago.  No history of asthma allergies. Would like refill for triamcinolone.  The following portions of the patient's history were reviewed and updated as appropriate: allergies, current medications, past medical history, family and social history, and problem list.  Patient is a current every day smoker.  Review of Systems See HPI above for review of systems.       Objective:   Physical Exam Gen:  Alert, cooperative patient who appears stated age in no acute distress.  Vital signs reviewed. HEENT:  [place]/AT.  EOMI, PERRL.  MMM, tonsils non-erythematous, non-edematous.  External ears WNL, Bilateral TM's normal without retraction, redness or bulging.  Neck: No masses or thyromegaly or limitation in range of motion.  No cervical lymphadenopathy. Pulm:  Clear to auscultation bilaterally with good air movement.  No wheezes or rales noted.   Cardiac:  Regular rate and rhythm without murmur auscultated.  Good S1/S2. Abd:   Soft/nondistended/nontender.  Good bowel sounds throughout all four quadrants.  No masses noted.  Ext:  No clubbing/cyanosis/erythema.  No edema noted bilateral lower extremities.   Skin: Scattered dry skin patches consistent with eczema across back and neck, scattered across trunk, elbow creases bilaterally  Neuro:  Grossly normal, no gait abnormalities Psych:  Not depressed or anxious appearing.  Conversant and engaged       Assessment & Plan:

## 2011-08-22 NOTE — Assessment & Plan Note (Signed)
Counseled to quit 

## 2011-08-22 NOTE — Assessment & Plan Note (Signed)
Sent in Neurontin again today she did not pick this up before and does not remember being prescribed previously. Did discuss the patient initiation of Neurontin. Patient expressed understanding. Followup in about 2 months for reevaluation and assessment of her improvement Followup sooner if worsening.

## 2011-08-22 NOTE — Assessment & Plan Note (Signed)
Refilled triamcinolone today

## 2011-08-22 NOTE — Assessment & Plan Note (Signed)
Question initial diagnosis as she has received benefit from metformin. She has been on combination of Lantus as well as metformin for at least the past 20 years. Her A1c is generally below 7. No refills needed today. A1c obtained today. Did discuss the proper CBG control would help relieve her neuropathic pain.

## 2011-09-06 ENCOUNTER — Telehealth: Payer: Self-pay | Admitting: Family Medicine

## 2011-09-06 NOTE — Telephone Encounter (Signed)
Returned call to patient.  Patient did not have office visit here for her recent illness and requesting note for being out of work all week.  Informed patient that our office is unable to give note for work.  No appts available today for office visit and patient informed she may need to go to the urgent care to be evaluated and get note for work.   Gaylene Brooks, RN

## 2011-09-06 NOTE — Telephone Encounter (Signed)
Pt has been out of work all week due to n/v/diarrhea.  She is now asking for a doctor note for this week.

## 2012-07-17 ENCOUNTER — Ambulatory Visit (INDEPENDENT_AMBULATORY_CARE_PROVIDER_SITE_OTHER): Payer: Self-pay | Admitting: Family Medicine

## 2012-07-17 ENCOUNTER — Encounter: Payer: Self-pay | Admitting: Family Medicine

## 2012-07-17 VITALS — BP 126/88 | HR 101 | Temp 98.3°F | Ht 69.0 in | Wt 124.0 lb

## 2012-07-17 DIAGNOSIS — R109 Unspecified abdominal pain: Secondary | ICD-10-CM

## 2012-07-17 DIAGNOSIS — E109 Type 1 diabetes mellitus without complications: Secondary | ICD-10-CM

## 2012-07-17 DIAGNOSIS — IMO0002 Reserved for concepts with insufficient information to code with codable children: Secondary | ICD-10-CM

## 2012-07-17 DIAGNOSIS — M792 Neuralgia and neuritis, unspecified: Secondary | ICD-10-CM

## 2012-07-17 DIAGNOSIS — E119 Type 2 diabetes mellitus without complications: Secondary | ICD-10-CM

## 2012-07-17 DIAGNOSIS — F17200 Nicotine dependence, unspecified, uncomplicated: Secondary | ICD-10-CM

## 2012-07-17 DIAGNOSIS — D649 Anemia, unspecified: Secondary | ICD-10-CM

## 2012-07-17 DIAGNOSIS — R1310 Dysphagia, unspecified: Secondary | ICD-10-CM

## 2012-07-17 LAB — COMPREHENSIVE METABOLIC PANEL
AST: 21 U/L (ref 0–37)
Albumin: 3.6 g/dL (ref 3.5–5.2)
BUN: 15 mg/dL (ref 6–23)
Calcium: 8.3 mg/dL — ABNORMAL LOW (ref 8.4–10.5)
Chloride: 100 mEq/L (ref 96–112)
Creat: 0.76 mg/dL (ref 0.50–1.10)
Glucose, Bld: 368 mg/dL — ABNORMAL HIGH (ref 70–99)
Potassium: 3.8 mEq/L (ref 3.5–5.3)

## 2012-07-17 LAB — CBC WITH DIFFERENTIAL/PLATELET
Basophils Absolute: 0 10*3/uL (ref 0.0–0.1)
Basophils Relative: 0 % (ref 0–1)
Eosinophils Relative: 0 % (ref 0–5)
HCT: 35.9 % — ABNORMAL LOW (ref 36.0–46.0)
Hemoglobin: 11.5 g/dL — ABNORMAL LOW (ref 12.0–15.0)
Lymphocytes Relative: 27 % (ref 12–46)
MCHC: 32 g/dL (ref 30.0–36.0)
MCV: 98.9 fL (ref 78.0–100.0)
Monocytes Absolute: 0.2 10*3/uL (ref 0.1–1.0)
Monocytes Relative: 5 % (ref 3–12)
Neutro Abs: 3.5 10*3/uL (ref 1.7–7.7)
RDW: 14.5 % (ref 11.5–15.5)

## 2012-07-17 LAB — POCT GLYCOSYLATED HEMOGLOBIN (HGB A1C): Hemoglobin A1C: 9.8

## 2012-07-17 LAB — AMYLASE: Amylase: 22 U/L (ref 0–105)

## 2012-07-17 MED ORDER — LISINOPRIL 5 MG PO TABS: 2.5000 mg | ORAL_TABLET | Freq: Every day | ORAL | Status: DC

## 2012-07-17 MED ORDER — GLIPIZIDE 10 MG PO TABS: 5.0000 mg | ORAL_TABLET | Freq: Every day | ORAL | Status: DC

## 2012-07-17 MED ORDER — METFORMIN HCL 500 MG PO TABS: 1000.0000 mg | ORAL_TABLET | Freq: Two times a day (BID) | ORAL | Status: DC

## 2012-07-17 NOTE — Patient Instructions (Addendum)
It was pleasure meeting you today. We discussed a great deal about your hypertension, diabetes and smoking. I am thrilled you are ready to quit smoking. I have refilled prescriptions for your metformin, Lisinopril, and glipizide. - Please make an appt with:     - Dr. Raymondo Band for help with quitting smoking (ASAP)     - Rosalita Chessman for help with your diabetes (ASAP)     - Dr. Claiborne Billings (myself) for lab results and PAP in 2-3 weeks  Smoking and Your Digestive System Cigarette smoking causes many life-threatening diseases. These include lung cancer, other cancers, emphysema, and heart disease. About 430,000 deaths each year are directly caused by cigarette smoking. Smoking results in disease-causing changes in all parts of the body. This includes the digestive system. This can cause serious effects, since the digestive system converts foods into nutrients the body needs to live. Smoking has been shown to have harmful effects on all parts of the digestive system. It adds to common disorders, such as heartburn and peptic ulcers. It also increases the risk of Crohn's disease, and possibly gallstones. Smoking seems to affect the liver by changing the way it handles drugs and alcohol and removes them. In fact, there seems to be enough evidence to stop smoking based solely on digestive distress. Some of the harmful effects of smoking are:  Heartburn (acid reflux).  Heartburn happens when acidic juices from the stomach splash into the esophagus, which has a more sensitive and less acid-resistant lining than the stomach. Normally, a muscular valve at the lower end of the esophagus keeps out the acid solution in the stomach. Smoking decreases the strength of the esophageal valve and its ability to keep out acidic stomach contents. This allows stomach acid reflux, or flow backward into the esophagus.  Smoking also seems to promote the movement of bile salts from the intestine to the stomach. This makes stomach acids more  harmful.  A peptic ulcer is an open sore in the lining of the stomach or duodenum (first part of the small intestine). The exact cause of ulcers is not known. A link between smoking cigarettes and ulcers, especially duodenal ulcers, does exist. Ulcers are more likely to occur, less likely to heal, and more likely to cause death in smokers than in nonsmokers.  Some research suggests that smoking might increase a person's risk of infection with the bacterium Helicobacter pylori (H. pylori). Most peptic ulcers are caused by this bacterium.  Stomach acid is also important in causing ulcers. Normally, most of this acid is buffered (neutralized) by the food we eat. Most of the unbuffered acid that enters the duodenum is quickly neutralized by sodium bicarbonate. This is a naturally occurring alkali, produced by the pancreas. Some studies show that smoking reduces the bicarbonate produced by the pancreas. This interferes with the neutralization of acid in the duodenum. Other studies suggest that chronic cigarette smoking may increase the amount of acid produced by the stomach.  Whatever causes the link between smoking and ulcers, two points have been repeatedly shown. People who smoke are more likely to develop an ulcer, especially a duodenal ulcer. Ulcers in smokers are less likely to heal quickly in response to otherwise effective treatment. This research strongly suggests that a person with an ulcer should stop smoking.  The liver is an important organ with many tasks. One task of the liver is to prepare drugs, alcohol, and other toxins for elimination (removal) from the body. There is evidence that smoking alters the ability  of the liver to effectively handle such substances. In some cases, this may influence the dose of medicine needed to treat an illness. Some research suggests that smoking can aggravate and speed up the course of liver disease caused by excessive alcohol intake.  Studies have shown that  smokers have weaker or less frequent stomach contractions while smoking, which can cause less efficient digestion.  Crohn's disease causes inflammation deep in the lining of the intestine. The disease causes pain and diarrhea. It usually affects the small intestine, but it can occur anywhere in the digestive tract. Research shows that current and former smokers have a higher risk of developing Crohn's disease than nonsmokers. Among people with the disease, smoking is linked with a higher rate of relapse, repeat surgery, and immunosuppressive treatment. In all areas, the risk for women who are current or former smokers is slightly higher than for men. Why smoking increases the risk of Crohn's disease is unknown.  Several studies suggest that smoking may increase the risk of developing gallstones. The risk may be higher for women. Research results on this topic are not consistent. More studies are needed.  Oral (lip and mouth) cancer and cancer of the pharynx (throat) and the esophagus are caused by smoking. Smoking may be associated with pancreatic cancer.  Some of the effects of smoking on the digestive system seem to be short-lived. For example, the effect of smoking on bicarbonate production by the pancreas does not appear to last. Half an hour after smoking, the production of bicarbonate returns to normal. The effects of smoking on how the liver handles drugs also disappear when a person stops smoking. However, people who no longer smoke still remain at risk for Crohn's disease. Document Released: 03/28/2004 Document Revised: 07/08/2011 Document Reviewed: 01/30/2009 [facility] Patient Information 2013 [place], [place].

## 2012-07-18 ENCOUNTER — Encounter: Payer: Self-pay | Admitting: Family Medicine

## 2012-07-18 DIAGNOSIS — R109 Unspecified abdominal pain: Secondary | ICD-10-CM | POA: Insufficient documentation

## 2012-07-18 DIAGNOSIS — R1310 Dysphagia, unspecified: Secondary | ICD-10-CM | POA: Insufficient documentation

## 2012-07-18 NOTE — Assessment & Plan Note (Signed)
-   patient is ready to quit. She would like assistance and has been referred to Dr. Raymondo Band.

## 2012-07-18 NOTE — Assessment & Plan Note (Signed)
-   Just reviewed this for today. Due to time will need to evaluate this further on her next appointment. - DDX:

## 2012-07-18 NOTE — Assessment & Plan Note (Signed)
-   Pain has been unchanged for her.  - She is unsure of her prescription coverage. If she has decent coverage she would like to try the gabapentin again. She never filled her prescriptions prior d/t cost.

## 2012-07-18 NOTE — Progress Notes (Signed)
Subjective:     Patient ID: Christine [name], female   DOB: [birth year], 42 y.o.   MRN: [id number]  HPI New patient. Patient has been to clinic prior, but not recently. She has been without mediations for some time. 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  Diabetes: Her A1c today is 9.8, and her glucose was 368. Her diabetes has been uncontrolled for a few months since she could not afford her mediations.  She endorses a sensation of pins sticking in her her feet. She has been prescribed gabapentin for this pain in the past, but has never been able to afford it. Her history shows she has been on glipizide and metformin in the past for her diabetes. She had been on Lantus at one time as well and then it was discontinued.  She does not watch her diet.   Abdominal Pain: She endorses a 20 pound unintentional weight loss since November. She is fatigued and is experiencing diffuse abdominal pain and diarrhea after eating. She reports this pain has a strong cramp that is relieved by defecation. She endorses nausea and occasional vomit after eating meals. She has 8-9 light brown--> green oily loose stools a day, with fecal urgency. She believes these symptoms started in November after she lost her father. She denies bloody stools.   Odynopahgia: She reports a pain that occurs with eating on occasions. She reports a feeling of her food getting stuck and she has to take a drink to help her food pass into her stomach. She points just below her xyphoid process for the location of the pain.     Tobacco abuse: She desires to quit smoking. She has quit before about 6 years ago for 3 months. She rates her desire to quit a 10/10. She rates her confidence level of quitting, with help, with be a 10. She wants to become healthy so she can be around for her new grandchildren.   Immunizations/preventive health: She is not UTD with her tetanus, flu or PAP. She declines the flu  vac today. She unsure if she needs a PPD for her job.    Review of Systems  Constitutional: Positive for activity change, fatigue and unexpected weight change. Negative for fever and appetite change.  HENT: Positive for trouble swallowing.   Eyes: Negative for visual disturbance.  Gastrointestinal: Positive for nausea, vomiting, abdominal pain and diarrhea. Negative for constipation, blood in stool and anal bleeding.  Musculoskeletal: Positive for back pain.  Neurological: Positive for dizziness, weakness and numbness. Negative for syncope.       Objective:   Physical Exam  Nursing note and vitals reviewed. Constitutional: She is oriented to person, place, and time. She appears cachectic. She is cooperative. No distress.  HENT:  Head: Normocephalic and atraumatic.  Nose: Nose normal.  Eyes: EOM are normal. Pupils are equal, round, and reactive to light. Right eye exhibits no discharge. Left eye exhibits no discharge. No scleral icterus.  Neck: Neck supple. No thyromegaly present.  Cardiovascular: Regular rhythm.  Tachycardia present.   Murmur heard.  Systolic murmur is present with a grade of 2/6  Pulses:      Radial pulses are 2+ on the right side, and 2+ on the left side.       Dorsalis pedis pulses are 2+ on the right side, and 2+ on the left side.       Posterior tibial pulses are 2+ on the right side, and  2+ on the left side.  Pulmonary/Chest: Effort normal. She has no wheezes. She has no rales.  Bilateral breath sounds diminished  Abdominal: Soft. Bowel sounds are normal. She exhibits no distension and no mass. There is tenderness in the epigastric area and left upper quadrant. There is positive Murphy's sign. There is no rebound and no guarding.  Murphy's positive, however entire LUQ and epigastric tender.   Musculoskeletal: She exhibits no edema and no tenderness.  Lymphadenopathy:    She has no cervical adenopathy.  Neurological: She is alert and oriented to person,  place, and time.  Skin: Skin is warm and dry. No rash noted.  Psychiatric: She has a normal mood and affect.   BP 126/88  Pulse 101  Temp(Src) 98.3 F (36.8 C) (Oral)  Ht 5\' 9"  (1.753 m)  Wt 124 lb (56.246 kg)  BMI 18.3 kg/m2

## 2012-07-18 NOTE — Assessment & Plan Note (Addendum)
-   CBC, CMP, Amylase and lipase today.   - Hgb: 11.5; Na: 132; Glucose: 368.   - All other lab values normal. - 37 pound weight loss not intentional in one year. Doubtful the etiology is secretory since her electrolytes were stable. A bacterial etiology should have self resolved. 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 Possible DDX include ulcerative colitis, Chron's, malignancy, IBS, her uncontrolled diabetes or hyperthyroid.  - Will check ESR/CRP and TSH on next visit - F/u: 2 weeks

## 2012-07-18 NOTE — Assessment & Plan Note (Signed)
-   She has been uncontrolled and without medications for some time.  - Restarted the metformin and glipizide today. I have her checking her blood sugars and reporting them back to me on her next visit. Will restart the Lantus if needed, since there has been question of her need for it in the past.  - also restarted Lisinopril (2.5) for kidney protection only. BP normal. - Wt loss admitted 20 pounds, actual 37 pounds in a year (unintentional) - A1c today 9.8, glucose 368 - CBG on next visit. - Referral to Rosalita Chessman (diabetes education)

## 2012-07-20 ENCOUNTER — Ambulatory Visit: Payer: Self-pay | Admitting: Home Health Services

## 2012-07-22 ENCOUNTER — Ambulatory Visit: Payer: Self-pay | Admitting: Home Health Services

## 2012-08-03 ENCOUNTER — Ambulatory Visit: Payer: Self-pay | Admitting: Pharmacist

## 2012-08-11 ENCOUNTER — Ambulatory Visit: Payer: Self-pay | Admitting: Family Medicine

## 2012-08-20 ENCOUNTER — Ambulatory Visit: Payer: Self-pay | Admitting: Family Medicine

## 2012-08-20 ENCOUNTER — Ambulatory Visit: Payer: Self-pay | Admitting: Pharmacist

## 2012-09-03 ENCOUNTER — Ambulatory Visit (INDEPENDENT_AMBULATORY_CARE_PROVIDER_SITE_OTHER): Payer: BC Managed Care – PPO | Admitting: Family Medicine

## 2012-09-03 ENCOUNTER — Ambulatory Visit (INDEPENDENT_AMBULATORY_CARE_PROVIDER_SITE_OTHER): Payer: BC Managed Care – PPO | Admitting: Pharmacist

## 2012-09-03 VITALS — BP 110/79 | HR 92 | Ht 69.69 in | Wt 117.3 lb

## 2012-09-03 VITALS — Temp 97.9°F

## 2012-09-03 DIAGNOSIS — E109 Type 1 diabetes mellitus without complications: Secondary | ICD-10-CM

## 2012-09-03 DIAGNOSIS — R197 Diarrhea, unspecified: Secondary | ICD-10-CM

## 2012-09-03 DIAGNOSIS — F17200 Nicotine dependence, unspecified, uncomplicated: Secondary | ICD-10-CM

## 2012-09-03 DIAGNOSIS — M792 Neuralgia and neuritis, unspecified: Secondary | ICD-10-CM

## 2012-09-03 DIAGNOSIS — K529 Noninfective gastroenteritis and colitis, unspecified: Secondary | ICD-10-CM

## 2012-09-03 DIAGNOSIS — IMO0002 Reserved for concepts with insufficient information to code with codable children: Secondary | ICD-10-CM

## 2012-09-03 LAB — CBC WITH DIFFERENTIAL/PLATELET
Basophils Absolute: 0 10*3/uL (ref 0.0–0.1)
Eosinophils Relative: 0 % (ref 0–5)
HCT: 34.8 % — ABNORMAL LOW (ref 36.0–46.0)
Hemoglobin: 11.6 g/dL — ABNORMAL LOW (ref 12.0–15.0)
Lymphocytes Relative: 58 % — ABNORMAL HIGH (ref 12–46)
Lymphs Abs: 2.1 10*3/uL (ref 0.7–4.0)
MCV: 91.3 fL (ref 78.0–100.0)
Monocytes Absolute: 0.3 10*3/uL (ref 0.1–1.0)
Monocytes Relative: 9 % (ref 3–12)
Neutro Abs: 1.2 10*3/uL — ABNORMAL LOW (ref 1.7–7.7)
RBC: 3.81 MIL/uL — ABNORMAL LOW (ref 3.87–5.11)
RDW: 15.1 % (ref 11.5–15.5)
WBC: 3.6 10*3/uL — ABNORMAL LOW (ref 4.0–10.5)

## 2012-09-03 LAB — BASIC METABOLIC PANEL
Calcium: 8.6 mg/dL (ref 8.4–10.5)
Potassium: 4.2 mEq/L (ref 3.5–5.3)
Sodium: 135 mEq/L (ref 135–145)

## 2012-09-03 MED ORDER — INSULIN GLARGINE 100 UNIT/ML ~~LOC~~ SOLN: 8.0000 [IU] | Freq: Every day | SUBCUTANEOUS | Status: DC

## 2012-09-03 NOTE — Patient Instructions (Addendum)
Thanks for coming in today.   Keep up your walking! Start Nicotine Patches 21 mg daily as soon as you can pick up.   Crossword puzzle book    Restart LANTUS insulin Add 1 unit each morning until you are 100 on your meter. Tomorrow dose is going to be 9, next day 10, ....   Next visit in Pharmacy Clinic 7-10 days.

## 2012-09-03 NOTE — Progress Notes (Signed)
  Subjective:    Patient ID: Christine [name], female    DOB: [birth year], 42 y.o.   MRN: [id number]  HPI Age when started using tobacco on a daily basis - 42yo. Number of Cigarettes per day - a pack/day (20) Brand smoked [employer]. Estimated Nicotine intake per day ~20mg .   Smokes first cigarette <5 minutes after waking. Smokes times per night: 5-6 times per night  Most recent quit attempt: quit for a month after getting sick. Longest time ever been tobacco free: one month. What Medications (NRT, bupropion, varenicline) used in past includes: none  Rates IMPORTANCE of quitting tobacco on 1-10 scale: 10. Rates READINESS of quitting tobacco on 1-10 scale: 10. Rates CONFIDENCE of quitting tobacco for a month on 1-10 scale: 8. Triggers to use tobacco include: when pt is mad, after meals, stress. Target quit date: ASAP, wants to see old grandchild grow up. Trying to cut down, smoked only half pack yesterday.  Pt arrives in clinic looking fatigued; very thin. Pt endorses dry mouth, nocturia 6-8 times/night. Pt endorses being off all medications for past 5-6 months.  Complains of diarrhea, (NOT interested in restarting meformin).   Review of Systems     Objective:   Physical Exam        Assessment & Plan:  Diabetes, Type 2: Last A1c 9.8. Unknown CBGs. P: Restart Lantus insulin at 8 units, add 1 unit each morning until BG at 100 on your meter. Continue hold on all other meds until next visit, potential for restart.   Meter covered on insurance plan = One[employer] - provided meter for pick up at front desk and sent in rx for prescription.  F/u in pharmacy clinic in 7-10 days.  Smoking Cessation, pt is willing and ready to quit. P: Start Nicotine patches 21mg  daily. Recommend working on crossword puzzles when pt has urge to smoke.  Mild/Moderate Nicotine Dependence of 11 years duration in a patient who is good candidate for success b/c she is motivate to quit (wants to see grandchild  grow up).    Patient counseled on purpose, proper use, and potential adverse effects.  Written information provided.   Met with [name] - [facility] Case Manager for assistance with medications including patches and insulin.   Total time face to face counseling: 1 hour. Patient seen with Franchot Erichsen , PharmD Resident and Richrd Humbles, PharmD candidate.  Phone call addendum 09/04/2012 - Called number [phone] Patient reports NO smoking from 7:30 AM until call at 9:30 AM.   Asked to pick up One touch meter at front desk.  Left meter for pick up.  Reports she slept through the night.

## 2012-09-04 MED ORDER — GLUCOSE BLOOD VI STRP: ORAL_STRIP | Status: DC

## 2012-09-04 MED ORDER — INSULIN SYRINGE-NEEDLE U-100 30G 0.5 ML MISC: 1.0000 | Freq: Once | Status: DC

## 2012-09-04 NOTE — Progress Notes (Signed)
Patient ID: Christine [name], female   DOB: 1970-11-13, 42 y.o.   MRN: [id number] Reviewed:  Agree with Dr. Macky Lower documentation and management.

## 2012-09-04 NOTE — Assessment & Plan Note (Signed)
Smoking Cessation, pt is willing and ready to quit. P: Start Nicotine patches 21mg  daily. Recommend working on crossword puzzles when pt has urge to smoke.  Mild/Moderate Nicotine Dependence of 11 years duration in a patient who is good candidate for success b/c she is motivate to quit (wants to see grandchild grow up).    Patient counseled on purpose, proper use, and potential adverse effects.  Written information provided.   Met with [name] - [facility] Case Manager for assistance with medications including patches and insulin.   Total time face to face counseling: 1 hour. Patient seen with Franchot Erichsen , PharmD Resident and Richrd Humbles, PharmD candidate.  Phone call addendum 09/04/2012 - Called number [phone] Patient reports NO smoking from 7:30 AM until call at 9:30 AM.   Asked to pick up One touch meter at front desk.  Left meter for pick up.  Reports she slept through the night.

## 2012-09-04 NOTE — Assessment & Plan Note (Signed)
Diabetes, Type 2: Last A1c 9.8. Unknown CBGs. P: Restart Lantus insulin at 8 units, add 1 unit each morning until BG at 100 on your meter. Continue hold on all other meds until next visit, potential for restart.   Meter covered on insurance plan = OneTouch - provided meter for pick up at front desk and sent in rx for prescription.  F/u in pharmacy clinic in 7-10 days.  Smoking Cessation, pt is willing and ready to quit. P: Start Nicotine patches 21mg  daily. Recommend working on crossword puzzles when pt has urge to smoke.  Mild/Moderate Nicotine Dependence of 11 years duration in a patient who is good candidate for success b/c she is motivate to quit (wants to see grandchild grow up).    Patient counseled on purpose, proper use, and potential adverse effects.  Written information provided.   Met with [name] - [facility] Case Manager for assistance with medications including patches and insulin.   Total time face to face counseling: 1 hour. Patient seen with Franchot Erichsen , PharmD Resident and Richrd Humbles, PharmD candidate.  Phone call addendum 09/04/2012 - Called number [phone] Patient reports NO smoking from 7:30 AM until call at 9:30 AM.   Asked to pick up One touch meter at front desk.  Left meter for pick up.  Reports she slept through the night.

## 2012-09-07 ENCOUNTER — Encounter: Payer: Self-pay | Admitting: Family Medicine

## 2012-09-07 NOTE — Patient Instructions (Signed)
Diabetes Diabetes, Frequently Asked Questions WHAT IS DIABETES? Most of the food we eat is turned into glucose (sugar). Our bodies use it for energy. The pancreas makes a hormone called insulin. It helps glucose get into the cells of our bodies. When you have diabetes, your body either does not make enough insulin or cannot use its own insulin as well as it should. This causes sugars to build up in your blood. WHAT ARE THE SYMPTOMS OF DIABETES?  Frequent urination.  Excessive thirst.  Unexplained weight loss.  Extreme hunger.  Blurred vision.  Tingling or numbness in hands or feet.  Feeling very tired much of the time.  Dry, itchy skin.  Sores that are slow to heal.  Yeast infections. WHAT ARE THE TYPES OF DIABETES? Type 1 Diabetes   About 10% of affected people have this type.  Usually occurs before the age of 48.  Usually occurs in thin to normal weight people. Type 2 Diabetes  About 90% of affected people have this type.  Usually occurs after the age of 73.  Usually occurs in overweight people.  More likely to have:  A family history of diabetes.  A history of diabetes during pregnancy (gestational diabetes).  High blood pressure.  High cholesterol and triglycerides. Gestational Diabetes  Occurs in about 4% of pregnancies.  Usually goes away after the baby is born.  More likely to occur in women with:  Family history of diabetes.  Previous gestational diabetes.  Obese.  Over 66 years old. WHAT IS PRE-DIABETES? Pre-diabetes means your blood glucose is higher than normal, but lower than the diabetes range. It also means you are at risk of getting type 2 diabetes and heart disease. If you are told you have pre-diabetes, have your blood glucose checked again in 1 to 2 years. WHAT IS THE TREATMENT FOR DIABETES? Treatment is aimed at keeping blood glucose near normal levels at all times. Learning how to manage this yourself is important in treating  diabetes. Depending on the type of diabetes you have, your treatment will include one or more of the following:  Monitoring your blood glucose.  Meal planning.  Exercise.  Oral medicine (pills) or insulin. CAN DIABETES BE PREVENTED? With type 1 diabetes, prevention is more difficult, because the triggers that cause it are not yet known. With type 2 diabetes, prevention is more likely, with lifestyle changes:  Maintain a healthy weight.  Eat healthy.  Exercise. IS THERE A CURE FOR DIABETES? No, there is no cure for diabetes. There is a lot of research going on that is looking for a cure, and progress is being made. Diabetes can be treated and controlled. People with diabetes can manage their diabetes and lead normal, active lives. SHOULD I BE TESTED FOR DIABETES? If you are at least 42 years old, you should be tested for diabetes. You should be tested again every 3 years. If you are 45 or older and overweight, you may want to get tested more often. If you are younger than 45, overweight, and have one or more of the following risk factors, you should be tested:  Family history of diabetes.  Inactive lifestyle.  High blood pressure. WHAT ARE SOME OTHER SOURCES FOR INFORMATION ON DIABETES? The following organizations may help in your search for more information on diabetes: National Diabetes Education Program (NDEP) Internet: [employer] American Diabetes Association Internet: http://www.diabetes.org  Juvenile Diabetes Foundation International Internet: [employer] Document Released: 04/18/2003 Document Revised: 07/08/2011 Document Reviewed: 02/10/2009 Exit[employer] Patient Information 2013 [place].  

## 2012-09-07 NOTE — Progress Notes (Signed)
Subjective:     Patient ID: Christine [name], female   DOB: 19-Nov-1970, 42 y.o.   MRN: [id number]  HPI  Follow up to DM: Patient has not taking her medications prescribed to her last visit for her diabetes. She reports she has not looked into to see if her insurance will cover her medications and what the copay will be. She again states she needs to do something about her diarrhea and weight. Endorses polydipsia, polyuria, chronic diarrhea ~6 times a day. An additional weight loss since that appointment of 5-7 pounds. 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 Patient reports she does not eat everyday, sometimes goes two days without eating. She does not watch her diet, with what she does eat. She is currently under a great deal of stress. She has seen Dr. Raymondo Band for smoking cessations and seems to be quite motivated to quit.  He also started her on insulin and arranged MAP program for her diabetic medications.   Review of SystemsDenies fever, positive for unintentional weight loss, and diarrhea.     Objective:   Physical Exam Temp(Src) 97.9 F (36.6 C) (Oral) Gen: Anorexic. Appears well today otherwise. Seems to be motivated this time to make a change.  CV: RRR. No mumur Lungs: CTAB ABD: Soft. Scaphoid. Diffusely TTP.  EXT: Sever neuropathy bilateral LE

## 2012-09-07 NOTE — Assessment & Plan Note (Signed)
Patient repeat CBC and CMP without electrolyte changes. I would expect if her diarrhea is as chronic has she reports there should be a shift at least in her potassium. ESR today in office was 15, not suggestive of inflammatory process. Her TSH was normal ruling out hyperthyroid causes of diarrhea. I am concerned this patient has anorexia nervosa. Their may be a component of IBS as well, and would eventually like to see her on anti-anxiety medication. However, I do not want to overwhelm her right now with smoking cessation, insulin and then SSRI, but this is something to discuss in the future.  - I will also like to get her to see Dr.Sykes as well for DM and underlying possible anorexia. She has been to see Dr.Koval and I believe she has seen or has an appointment with Chase Picket.

## 2012-09-07 NOTE — Assessment & Plan Note (Signed)
-   Dr. Raymondo Band started her on Lantus 8u adding 1 unit each morning until BG ~100. Compliance has been a major issue for this patient in the past. She was suppose to start on medications last visit and had not. Her CBG in office today was ~230. Meter was provided for her today and she has been introduced to the MAP [facility]) program, so hopefully she will have better compliance.  - Follow up in pharm clinic in 7-10 days and with me in 2-3 weeks, with CBG readings.

## 2012-09-17 ENCOUNTER — Ambulatory Visit: Payer: BC Managed Care – PPO | Admitting: Pharmacist

## 2012-09-17 DIAGNOSIS — E109 Type 1 diabetes mellitus without complications: Secondary | ICD-10-CM

## 2012-09-22 ENCOUNTER — Ambulatory Visit: Payer: BC Managed Care – PPO | Admitting: Pharmacist

## 2012-09-24 ENCOUNTER — Encounter (HOSPITAL_COMMUNITY): Payer: Self-pay | Admitting: Emergency Medicine

## 2012-09-24 ENCOUNTER — Emergency Department (HOSPITAL_COMMUNITY): Payer: BC Managed Care – PPO

## 2012-09-24 ENCOUNTER — Emergency Department (HOSPITAL_COMMUNITY)
Admission: EM | Admit: 2012-09-24 | Discharge: 2012-09-24 | Disposition: A | Discharge status: Home / Self Care | Payer: BC Managed Care – PPO | Attending: Emergency Medicine | Admitting: Emergency Medicine

## 2012-09-24 DIAGNOSIS — K921 Melena: Secondary | ICD-10-CM | POA: Insufficient documentation

## 2012-09-24 DIAGNOSIS — Z3202 Encounter for pregnancy test, result negative: Secondary | ICD-10-CM | POA: Insufficient documentation

## 2012-09-24 DIAGNOSIS — F1010 Alcohol abuse, uncomplicated: Secondary | ICD-10-CM | POA: Insufficient documentation

## 2012-09-24 DIAGNOSIS — R197 Diarrhea, unspecified: Secondary | ICD-10-CM | POA: Insufficient documentation

## 2012-09-24 DIAGNOSIS — E119 Type 2 diabetes mellitus without complications: Secondary | ICD-10-CM | POA: Insufficient documentation

## 2012-09-24 DIAGNOSIS — F17200 Nicotine dependence, unspecified, uncomplicated: Secondary | ICD-10-CM | POA: Insufficient documentation

## 2012-09-24 DIAGNOSIS — R748 Abnormal levels of other serum enzymes: Secondary | ICD-10-CM

## 2012-09-24 DIAGNOSIS — I10 Essential (primary) hypertension: Secondary | ICD-10-CM | POA: Insufficient documentation

## 2012-09-24 DIAGNOSIS — R634 Abnormal weight loss: Secondary | ICD-10-CM | POA: Insufficient documentation

## 2012-09-24 DIAGNOSIS — Z794 Long term (current) use of insulin: Secondary | ICD-10-CM | POA: Insufficient documentation

## 2012-09-24 DIAGNOSIS — R1013 Epigastric pain: Secondary | ICD-10-CM

## 2012-09-24 DIAGNOSIS — Z79899 Other long term (current) drug therapy: Secondary | ICD-10-CM | POA: Insufficient documentation

## 2012-09-24 DIAGNOSIS — R112 Nausea with vomiting, unspecified: Secondary | ICD-10-CM | POA: Insufficient documentation

## 2012-09-24 DIAGNOSIS — K920 Hematemesis: Secondary | ICD-10-CM | POA: Insufficient documentation

## 2012-09-24 LAB — URINALYSIS, ROUTINE W REFLEX MICROSCOPIC
Bilirubin Urine: NEGATIVE
Glucose, UA: 250 mg/dL — AB
Hgb urine dipstick: NEGATIVE
Nitrite: NEGATIVE
Specific Gravity, Urine: 1.019 (ref 1.005–1.030)
pH: 5 (ref 5.0–8.0)

## 2012-09-24 LAB — CBC WITH DIFFERENTIAL/PLATELET
Basophils Relative: 1 % (ref 0–1)
Eosinophils Absolute: 0 10*3/uL (ref 0.0–0.7)
Lymphs Abs: 1.7 10*3/uL (ref 0.7–4.0)
MCH: 30 pg (ref 26.0–34.0)
MCHC: 34 g/dL (ref 30.0–36.0)
Neutrophils Relative %: 64 % (ref 43–77)
Platelets: 179 10*3/uL (ref 150–400)
RBC: 3.7 MIL/uL — ABNORMAL LOW (ref 3.87–5.11)

## 2012-09-24 LAB — COMPREHENSIVE METABOLIC PANEL
ALT: 70 U/L — ABNORMAL HIGH (ref 0–35)
AST: 129 U/L — ABNORMAL HIGH (ref 0–37)
Albumin: 3.1 g/dL — ABNORMAL LOW (ref 3.5–5.2)
Alkaline Phosphatase: 175 U/L — ABNORMAL HIGH (ref 39–117)
Potassium: 4.3 mEq/L (ref 3.5–5.1)
Sodium: 136 mEq/L (ref 135–145)
Total Protein: 6.6 g/dL (ref 6.0–8.3)

## 2012-09-24 LAB — OCCULT BLOOD, POC DEVICE: Fecal Occult Bld: NEGATIVE

## 2012-09-24 MED ORDER — ONDANSETRON HCL 4 MG/2ML IJ SOLN
4.0000 mg | Freq: Once | INTRAMUSCULAR | Status: AC
  Administered 2012-09-24: 4 mg via INTRAVENOUS
  Filled 2012-09-24: qty 2

## 2012-09-24 MED ORDER — ONDANSETRON 4 MG PO TBDP: 4.0000 mg | ORAL_TABLET | Freq: Three times a day (TID) | ORAL | Status: DC | PRN

## 2012-09-24 MED ORDER — SODIUM CHLORIDE 0.9 % IV BOLUS (SEPSIS)
1000.0000 mL | Freq: Once | INTRAVENOUS | Status: AC
  Administered 2012-09-24: 1000 mL via INTRAVENOUS

## 2012-09-24 MED ORDER — HYDROMORPHONE HCL PF 1 MG/ML IJ SOLN
1.0000 mg | Freq: Once | INTRAMUSCULAR | Status: AC
  Administered 2012-09-24: 1 mg via INTRAVENOUS
  Filled 2012-09-24: qty 1

## 2012-09-24 MED ORDER — CEPHALEXIN 500 MG PO CAPS: 500.0000 mg | ORAL_CAPSULE | Freq: Once | ORAL | Status: DC

## 2012-09-24 MED ORDER — PANTOPRAZOLE SODIUM 40 MG IV SOLR
40.0000 mg | Freq: Once | INTRAVENOUS | Status: AC
  Administered 2012-09-24: 40 mg via INTRAVENOUS
  Filled 2012-09-24: qty 40

## 2012-09-24 NOTE — ED Notes (Signed)
Bed:WA18<BR> Expected date:<BR> Expected time:<BR> Means of arrival:<BR> Comments:<BR> Abdominal pain

## 2012-09-24 NOTE — ED Notes (Signed)
Pt states that PCP took her off her BP medications about 2 weeks ago due to normal range in BP. Only medication for DM pt takes is Lantus per pt.

## 2012-09-24 NOTE — ED Provider Notes (Signed)
History     CSN: [id number]  Arrival date & time 09/24/12  4098   First MD Initiated Contact with Patient 09/24/12 0845      Chief Complaint  Patient presents with  . Abdominal Pain    (Consider location/radiation/quality/duration/timing/severity/associated sxs/prior treatment) HPI Comments: Pt with PMH significant for HTN, DM, and substance abuse presents to the ED for abdominal pain.  States she woke up early this morning vomited twice and has been having severe, sharp, non-radiating, epigastric abdominal pain since.  Pt is a daily drinker, only had 1 beer last night before she felt sick.  States she frequently vomits but today there was some blood in her emesis with small clots which has never happened before.  Also endorses daily diarrhea with intermittent blood in her stool but not in the past few days. No recent abx use or change in diet.  Notes approx 50lb unintentional weight loss over the past few months.  No hx of GERD, IBD, or GB disease.  No personal or family hx of colon CA. Has never seen GI in the past.  Denies any chest pain, SOB, dysuria, hematuria, increased urinary frequency, or vaginal discharge.    The history is provided by the patient.    Past Medical History  Diagnosis Date  . Diabetes mellitus   . Hypertension   . Substance abuse     History reviewed. No pertinent past surgical history.  No family history on file.  History  Substance Use Topics  . Smoking status: Current Every Day Smoker  . Smokeless tobacco: Not on file  . Alcohol Use: Not on file    OB History   Grav Para Term Preterm Abortions TAB SAB Ect Mult Living                  Review of Systems  Gastrointestinal: Positive for nausea, vomiting, abdominal pain and diarrhea.  All other systems reviewed and are negative.    Allergies  Review of patient's allergies indicates no known allergies.  Home Medications   Current Outpatient Rx  Name  Route  Sig  Dispense  Refill  .  glipiZIDE (GLUCOTROL) 10 MG tablet   Oral   Take 0.5 tablets (5 mg total) by mouth daily.   30 tablet   1   . glucose blood (ONE TOUCH ULTRA TEST) test strip      Use as instructed.   Qdispense - sufficient for 1 time daily testing.   100 each   12   . insulin glargine (LANTUS) 100 UNIT/ML injection   Subcutaneous   Inject 0.08-0.15 mLs (8-15 Units total) into the skin daily. Increase 1 unit each AM until fasting CBG is 100   10 mL   0   . Insulin Syringe-Needle U-100 30G 0.5 ML MISC   Does not apply   1 Container by Does not apply route once. Dispense QS for 1 month supply of once daily injection   1 each   11   . lisinopril (PRINIVIL,ZESTRIL) 5 MG tablet   Oral   Take 0.5 tablets (2.5 mg total) by mouth daily.   90 tablet   3   . EXPIRED: lisinopril-hydrochlorothiazide (ZESTORETIC) 20-12.5 MG per tablet   Oral   Take 1 tablet by mouth daily.   30 tablet   2   . metFORMIN (GLUCOPHAGE) 500 MG tablet   Oral   Take 2 tablets (1,000 mg total) by mouth 2 (two) times daily with a meal.  180 tablet   3     There were no vitals taken for this visit.  Physical Exam  Nursing note and vitals reviewed. Constitutional: She is oriented to person, place, and time. She appears well-developed and well-nourished.  HENT:  Head: Normocephalic and atraumatic.  Mouth/Throat: Uvula is midline and oropharynx is clear and moist. No posterior oropharyngeal edema or posterior oropharyngeal erythema.  Mildly dry mucus membranes, no visible blood or signs of  active bleeding  Eyes: Conjunctivae and EOM are normal. Pupils are equal, round, and reactive to light.  Neck: Normal range of motion.  Cardiovascular: Normal rate, regular rhythm and normal heart sounds.   Pulmonary/Chest: Effort normal and breath sounds normal.  Abdominal: Soft. Bowel sounds are normal. There is tenderness in the epigastric area. There is no CVA tenderness, no tenderness at McBurney's point and negative Murphy's  sign.  Genitourinary: Rectum normal. Rectal exam shows no external hemorrhoid, no internal hemorrhoid, no fissure, no tenderness and anal tone normal. Guaiac negative stool.  guaiac negative  Musculoskeletal: Normal range of motion.  Neurological: She is alert and oriented to person, place, and time.  Skin: Skin is warm and dry.  Psychiatric: She has a normal mood and affect.    ED Course  Procedures (including critical care time)  Labs Reviewed  CBC WITH DIFFERENTIAL - Abnormal; Notable for the following:    RBC 3.70 (*)    Hemoglobin 11.1 (*)    HCT 32.6 (*)    All other components within normal limits  COMPREHENSIVE METABOLIC PANEL - Abnormal; Notable for the following:    Glucose, Bld 218 (*)    Albumin 3.1 (*)    AST 129 (*)    ALT 70 (*)    Alkaline Phosphatase 175 (*)    All other components within normal limits  LIPASE, BLOOD  URINALYSIS, ROUTINE W REFLEX MICROSCOPIC  PREGNANCY, URINE  OCCULT BLOOD, POC DEVICE   US Abdomen Complete  09/24/2012   *RADIOLOGY REPORT*  Clinical Data:  Epigastric pain.  COMPLETE ABDOMINAL ULTRASOUND  Comparison:  No priors.  Findings:  Gallbladder:  No shadowing gallstones or echogenic sludge.  No gallbladder wall thickening or pericholecystic fluid.  Negative sonographic Murphy's sign according to the ultrasound technologist.  Common bile duct:  Normal caliber measuring 4 mm in the porta hepatis.  Liver:  In the left lobe of the liver there is a 1.5 x 1.0 x 1.3 cm heterogeneously echogenic focus.  No other focal hepatic lesions are noted.  No intrahepatic biliary ductal dilatation.  Portal vein is patent with normal hepatopetal flow.  IVC:  Patent throughout its visualized course in the abdomen.  Pancreas:  Poorly visualized secondary to overlying bowel gas. Echogenic focus in the head of the pancreas with distal posterior acoustic shadowing compatible with calcifications within the pancreas.  Spleen:  Normal size and echotexture without focal  parenchymal abnormality.5.3 cm in length.  Right Kidney:  No hydronephrosis.  Well-preserved cortex.  Normal size and parenchymal echotexture without focal abnormalities. 9.8 cm in length.  Left Kidney:  No hydronephrosis.  Well-preserved cortex.  Normal size and parenchymal echotexture without focal abnormalities. 10.6 cm in length.  Abdominal aorta:  1.8 cm in diameter proximally and tapers appropriately distally.  IMPRESSION: 1.  No acute findings in the abdomen to account for the patient's symptoms. 2.  Nonspecific 1.5 x 1.0 x 1.3 cm echogenic lesion in the left lobe of the liver.  Differential considerations include an area of focal fatty infiltration or  small cavernous hemangioma, however, other aggressive lesions are difficult to entirely exclude by ultrasound.  This could be definitively characterized with a follow- up contrast-enhanced MRI in 6 months if of clinical concern. 3.  Pancreas was poorly visualized, however, there is a calcification in the head of the pancreas, which may be sequelae of chronic pancreatitis.  A partially calcified pancreatic lesion is less likely, but is difficult to exclude on this limited ultrasound examination.  Clinical correlation is recommended, with consideration for further evaluation with contrast enhanced CT imaging if of clinical concern.   Original Report Authenticated By: Trudie Reed, M.D.     1. Epigastric abdominal pain   2. Nausea & vomiting   3. Elevated liver enzymes       MDM   42 y.o. F presenting to the ED for nausea, vomiting, and epigastric abdominal pain.  No hx of GB disease or pancreatitis.  Pt is a daily drinker.  Recent unintentional weight loss over the past few months.  No personal or family hx of colon CA.  Labs as above. H/H stable. AST/ALT ratio almost 2:1- consistent with chronic EtOH use.  Abd u/s negative for GB disease but with incidental finding of calcification of head of pancreas- chronic pancreatitis vs calcified  neoplasm.  Recommended FU CT scan to monitor but i feel this could safely be done as an OP.  Resolution of sx with protonix, dilaudid, and zofran.  Repeat abdominal exam without epigastric tenderness.  Pt afebrile, non-toxic appearing, NAD, VS stable- ok for d/c.  Rx zofran.  Instructed she may use OTC pepto or imodium to help with diarrhea.  Discussed u/s findings and plan with pt, she acknowledged understanding and is agreeable to plan.  Return precautions advised.  Discussed pt with Dr. Lynelle Doctor who reviewed u/s results and agrees with plan.           Garlon Hatchet, PA-C 09/25/12 1011

## 2012-09-24 NOTE — ED Notes (Signed)
Per EMS: pt states she vomited twice this morning and is now having epigastric pain.

## 2012-09-24 NOTE — ED Notes (Signed)
US in Rm 

## 2012-09-25 NOTE — ED Provider Notes (Signed)
Medical screening examination/treatment/procedure(s) were performed by non-physician practitioner and as supervising physician I was immediately available for consultation/collaboration.    Kaida Games R Omaya Nieland, MD 09/25/12 1517 

## 2012-09-29 ENCOUNTER — Encounter: Payer: Self-pay | Admitting: Family Medicine

## 2012-09-29 ENCOUNTER — Ambulatory Visit (INDEPENDENT_AMBULATORY_CARE_PROVIDER_SITE_OTHER): Payer: BC Managed Care – PPO | Admitting: Family Medicine

## 2012-09-29 VITALS — BP 146/83 | HR 82 | Temp 97.8°F | Ht 69.0 in | Wt 123.6 lb

## 2012-09-29 DIAGNOSIS — F32A Depression, unspecified: Secondary | ICD-10-CM

## 2012-09-29 DIAGNOSIS — E109 Type 1 diabetes mellitus without complications: Secondary | ICD-10-CM

## 2012-09-29 DIAGNOSIS — Z9189 Other specified personal risk factors, not elsewhere classified: Secondary | ICD-10-CM

## 2012-09-29 DIAGNOSIS — R109 Unspecified abdominal pain: Secondary | ICD-10-CM

## 2012-09-29 DIAGNOSIS — F3289 Other specified depressive episodes: Secondary | ICD-10-CM

## 2012-09-29 DIAGNOSIS — F329 Major depressive disorder, single episode, unspecified: Secondary | ICD-10-CM | POA: Insufficient documentation

## 2012-09-29 LAB — HIV ANTIBODY (ROUTINE TESTING W REFLEX): HIV: NONREACTIVE

## 2012-09-29 MED ORDER — PANTOPRAZOLE SODIUM 40 MG PO TBEC: 40.0000 mg | DELAYED_RELEASE_TABLET | Freq: Every day | ORAL | Status: DC

## 2012-09-29 MED ORDER — PAROXETINE HCL 20 MG PO TABS: 20.0000 mg | ORAL_TABLET | ORAL | Status: DC

## 2012-09-29 NOTE — Progress Notes (Signed)
Subjective:     Patient ID: Christine [name], female   DOB: [birth year], 41 y.o.   MRN: [id number]  HPI ED f/u: Patient presents for ED follow up of vomiting bright red blood x2. She reports it has not happen since the visit to the ED, but she is still vomiting. She reports nothing made it better or worse. She also felt dizzy at the time she was vomiting. She reports moderate epigastric tenderness constantly and occasional nausea. She reports she "snacks all day long," and eats 1 full meal a day. [place] in the ED was normal with the exception of a mass in her liver, in which they recommended follow up with MRI in 6 months. She has not noticed blood in her stool and she has had chronic diarrhea for months that has been unresolved, but improving per patient. [facility] care has been initiated, however patient states they have not been in touch since her last office visit.   Diabetes: Patient has been noncompliant with her diabetes management. She recently started getting back on tract. A meter has been provided for her, however she can not afford the strips. She bought strips last week and has started takingher BG in the morning and report ranges from 78 to 189. She is now on approximately 30 units of Lantus per her report. More education was completed today on the goal of below 100 in the morning and Lantus administration/titration. She was confused on the fact of Lantus be long acting and was worried to take too much.   Anxiety/depression: Patient feels her anxiety is increasing and her family has brought it to her attention she is "snapping" at them more often. She has had increased depression and anxiety since she experienced the loss of a loved one last Nov. She reports she does not have as much patience as she has prior and can feel the stress. She also suffers from IBS, which has recently been increased, likely d/t to stress.  She feels her "mood swings" are a result of her stress and would like help dealing with  it. She is ready to try a medication for her depression and anxiety. She has no history of Bipolar disorder or other mental illness.   Review of Systems See above HPI     Objective:   Physical Exam BP 146/83  Pulse 82  Temp(Src) 97.8 F (36.6 C) (Oral)  Ht 5\' 9"  (1.753 m)  Wt 123 lb 9.6 oz (56.065 kg)  BMI 18.24 kg/m2 Gen: Anorexic. Pleasant female.  CV: RRR. No murmur Lungs: CTAB ABD: scaphoid. Soft. Tender mid-epigastric. MD. BS present.   US Abdomen Complete  09/24/2012   .  IMPRESSION: 1.  No acute findings in the abdomen to account for the patient's symptoms. 2.  Nonspecific 1.5 x 1.0 x 1.3 cm echogenic lesion in the left lobe of the liver.  Differential considerations include an area of focal fatty infiltration or small cavernous hemangioma, however, other aggressive lesions are difficult to entirely exclude by ultrasound.  This could be definitively characterized with a follow- up contrast-enhanced MRI in 6 months if of clinical concern. 3.  Pancreas was poorly visualized, however, there is a calcification in the head of the pancreas, which may be sequelae of chronic pancreatitis.  A partially calcified pancreatic lesion is less likely, but is difficult to exclude on this limited ultrasound examination.  Clinical correlation is recommended, with consideration for further evaluation with contrast enhanced CT imaging if of clinical concern.   Original  Report Authenticated By: Trudie Reed, M.D.

## 2012-09-29 NOTE — Assessment & Plan Note (Signed)
-   Patient with increased anxiety and now clinically depressed, since the loss of a loved one at the end of last year. Now with family members urging her to do something about her condition and mood.  - Will start Paxil today and see back in 3 weeks to see how she is tolerating.  - Of note: hopefully this will help her with her appetite and weight loss as well.

## 2012-09-29 NOTE — Patient Instructions (Signed)
I will place a referral for GI today for you to investigate further your chronic diarrhea and now chronic vomiting. I will start a medication called a PPI for you that will hopefully help with your stomach pain.  Your depression and anxiety is a concern, and seems to be affecting your health and your family. I think it is wise of you to decide to start a medication to help you with this.  I will need to see you in 3 weeks. Please keep your Dr. Raymondo Band appt for smoking cessation and diabetes management.   Depression, Adult Depression is feeling sad, low, down in the dumps, blue, gloomy, or empty. In general, there are two kinds of depression:  Normal sadness or grief. This can happen after something upsetting. It often goes away on its own within 2 weeks. After losing a loved one (bereavement), normal sadness and grief may last longer than two weeks. It usually gets better with time.  Clinical depression. This kind lasts longer than normal sadness or grief. It keeps you from doing the things you normally do in life. It is often hard to function at home, work, or at school. It may affect your relationships with others. Treatment is often needed. GET HELP RIGHT AWAY IF:  You have thoughts about hurting yourself or others.  You lose touch with reality (psychotic symptoms). You may:  See or hear things that are not real.  Have untrue beliefs about your life or people around you.  Your medicine is giving you problems. MAKE SURE YOU:  Understand these instructions.  Will watch your condition.  Will get help right away if you are not doing well or get worse. Document Released: 05/18/2010 Document Revised: 01/08/2012 Document Reviewed: 05/18/2010 [employer] Patient Information 2014 [place], [place].

## 2012-09-29 NOTE — Assessment & Plan Note (Signed)
-   patient has not followed up with Dr. Raymondo Band or myself with her diabetes.  - She is not up to about ~30 u of lantus daily  - Appointment with Raymondo Band was made for next week.

## 2012-09-30 LAB — HEPATITIS PANEL, ACUTE
Hep A IgM: NEGATIVE
Hep B C IgM: NEGATIVE
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
## 2012-10-08 ENCOUNTER — Ambulatory Visit (INDEPENDENT_AMBULATORY_CARE_PROVIDER_SITE_OTHER): Payer: BC Managed Care – PPO | Admitting: Pharmacist

## 2012-10-08 ENCOUNTER — Encounter: Payer: Self-pay | Admitting: Pharmacist

## 2012-10-08 VITALS — BP 127/82 | HR 79 | Ht 69.0 in | Wt 122.0 lb

## 2012-10-08 DIAGNOSIS — E109 Type 1 diabetes mellitus without complications: Secondary | ICD-10-CM

## 2012-10-08 DIAGNOSIS — F17200 Nicotine dependence, unspecified, uncomplicated: Secondary | ICD-10-CM

## 2012-10-08 MED ORDER — INSULIN GLARGINE 100 UNIT/ML ~~LOC~~ SOLN: 15.0000 [IU] | Freq: Every day | SUBCUTANEOUS | Status: DC

## 2012-10-08 NOTE — Progress Notes (Signed)
Patient ID: Christine [name], female   DOB: 30-Jul-1970, 42 y.o.   MRN: [id number] Reviewed: Agree with Dr. Macky Lower management and documentation.

## 2012-10-08 NOTE — Assessment & Plan Note (Signed)
Diabetes of many yrs duration currently under fair and improved control of blood glucose based on home fasting CBG readings < 200 and random CBG readings of as low as 51. Control is suboptimal due to variable food intake and likely higher than needed dose of Lantus. Reports  hypoglycemic events.  Able to verbalize appropriate hypoglycemia management plan. Continued basal insulin Lantus (insulin glargine) 15 units once daily in the AM.   Instructed to cut back to 14 then 13 or 12 if she notices fasting readings < 100 routinely.  She will attempt to obtain post-prandial readings periodically to assess for the need to initiate prandial insulin.  Written patient instructions provided.  Follow up in  Pharmacist Clinic Visit in August or earlier as needed.   Total time in face to face counseling 30 minutes.  Patient seen with Dr. Paulina Fusi, PGY 1.

## 2012-10-08 NOTE — Assessment & Plan Note (Signed)
Moderate Nicotine Dependence of many years duration in a patient who is fair candidate for success b/c of willingness to cut down over the next month from 10 cigs per day to 5 cigs per day. Encouraged attempt to taper at this time. NOT interested in setting quit date at this time.

## 2012-10-08 NOTE — Progress Notes (Signed)
  Subjective:    Patient ID: Christine [name], female    DOB: 1970/06/22, 42 y.o.   MRN: [id number]  HPI Patient arrives in good spirits and happier mood than previous visit.   Arrives with mother "Lou-Lou" who is supportive of her changes.   Patient reports adherence with insulin since last visit.  States she is still in supply of insulin.   She denies nocturnal polyuria and has stated she is sleeping better and eating better.   She reports eating 3 meals per day of substantial caloric intake.     She states her blood glucose readings 60 -200.   Goal weight per patient 140-145 lbs  Review of Systems     Objective:   Physical Exam  CBG readings 51-392   Recent readings 51-220.        Assessment & Plan:   Diabetes of many yrs duration currently under fair and improved control of blood glucose based on home fasting CBG readings < 200 and random CBG readings of as low as 51. Control is suboptimal due to variable food intake and likely higher than needed dose of Lantus. Reports  hypoglycemic events.  Able to verbalize appropriate hypoglycemia management plan. Continued basal insulin Lantus (insulin glargine) 15 units once daily in the AM.   Instructed to cut back to 14 then 13 or 12 if she notices fasting readings < 100 routinely.  She will attempt to obtain post-prandial readings periodically to assess for the need to initiate prandial insulin.  Written patient instructions provided.  Follow up in  Pharmacist Clinic Visit in August or earlier as needed.   Total time in face to face counseling 30 minutes.  Patient seen with Dr. Paulina Fusi, PGY 1.  Moderate Nicotine Dependence of many years duration in a patient who is fair candidate for success b/c of willingness to cut down over the next month from 10 cigs per day to 5 cigs per day. Encouraged attempt to taper at this time. NOT interested in setting quit date at this time.

## 2012-10-08 NOTE — Patient Instructions (Addendum)
Continue Lantus 15 units each morning.   Continue checking Blood Glucose readings 1-2 times per day.  Bring your meter back in with you to next visit.   Cut down to 5 cigarettes or less by your next visit in 1 month with Dr. Claiborne Billings.   Next visit with Rx in August or earlier if necessary.

## 2012-11-16 ENCOUNTER — Encounter (HOSPITAL_COMMUNITY): Payer: Self-pay | Admitting: Emergency Medicine

## 2012-11-16 ENCOUNTER — Inpatient Hospital Stay (HOSPITAL_COMMUNITY)
Admission: EM | Admit: 2012-11-16 | Discharge: 2012-11-20 | DRG: 566 | Disposition: A | Discharge status: Home / Self Care | Payer: BC Managed Care – PPO | Attending: Family Medicine | Admitting: Family Medicine

## 2012-11-16 DIAGNOSIS — Z681 Body mass index (BMI) 19 or less, adult: Secondary | ICD-10-CM

## 2012-11-16 DIAGNOSIS — E43 Unspecified severe protein-calorie malnutrition: Secondary | ICD-10-CM

## 2012-11-16 DIAGNOSIS — F32A Depression, unspecified: Secondary | ICD-10-CM | POA: Diagnosis present

## 2012-11-16 DIAGNOSIS — F3289 Other specified depressive episodes: Secondary | ICD-10-CM | POA: Diagnosis present

## 2012-11-16 DIAGNOSIS — E1010 Type 1 diabetes mellitus with ketoacidosis without coma: Principal | ICD-10-CM | POA: Diagnosis present

## 2012-11-16 DIAGNOSIS — K3189 Other diseases of stomach and duodenum: Secondary | ICD-10-CM | POA: Diagnosis present

## 2012-11-16 DIAGNOSIS — Z794 Long term (current) use of insulin: Secondary | ICD-10-CM

## 2012-11-16 DIAGNOSIS — R109 Unspecified abdominal pain: Secondary | ICD-10-CM

## 2012-11-16 DIAGNOSIS — E1110 Type 2 diabetes mellitus with ketoacidosis without coma: Secondary | ICD-10-CM

## 2012-11-16 DIAGNOSIS — Z9189 Other specified personal risk factors, not elsewhere classified: Secondary | ICD-10-CM

## 2012-11-16 DIAGNOSIS — R197 Diarrhea, unspecified: Secondary | ICD-10-CM | POA: Diagnosis present

## 2012-11-16 DIAGNOSIS — R1310 Dysphagia, unspecified: Secondary | ICD-10-CM

## 2012-11-16 DIAGNOSIS — R634 Abnormal weight loss: Secondary | ICD-10-CM | POA: Diagnosis present

## 2012-11-16 DIAGNOSIS — E109 Type 1 diabetes mellitus without complications: Secondary | ICD-10-CM

## 2012-11-16 DIAGNOSIS — R112 Nausea with vomiting, unspecified: Secondary | ICD-10-CM | POA: Diagnosis present

## 2012-11-16 DIAGNOSIS — F1910 Other psychoactive substance abuse, uncomplicated: Secondary | ICD-10-CM | POA: Diagnosis present

## 2012-11-16 DIAGNOSIS — K529 Noninfective gastroenteritis and colitis, unspecified: Secondary | ICD-10-CM

## 2012-11-16 DIAGNOSIS — D649 Anemia, unspecified: Secondary | ICD-10-CM

## 2012-11-16 DIAGNOSIS — K861 Other chronic pancreatitis: Secondary | ICD-10-CM

## 2012-11-16 DIAGNOSIS — F329 Major depressive disorder, single episode, unspecified: Secondary | ICD-10-CM | POA: Diagnosis present

## 2012-11-16 DIAGNOSIS — M792 Neuralgia and neuritis, unspecified: Secondary | ICD-10-CM

## 2012-11-16 DIAGNOSIS — E873 Alkalosis: Secondary | ICD-10-CM | POA: Diagnosis present

## 2012-11-16 DIAGNOSIS — K7689 Other specified diseases of liver: Secondary | ICD-10-CM | POA: Diagnosis present

## 2012-11-16 DIAGNOSIS — F17200 Nicotine dependence, unspecified, uncomplicated: Secondary | ICD-10-CM

## 2012-11-16 DIAGNOSIS — I10 Essential (primary) hypertension: Secondary | ICD-10-CM | POA: Diagnosis present

## 2012-11-16 DIAGNOSIS — R1013 Epigastric pain: Secondary | ICD-10-CM | POA: Diagnosis present

## 2012-11-16 DIAGNOSIS — E785 Hyperlipidemia, unspecified: Secondary | ICD-10-CM

## 2012-11-16 DIAGNOSIS — F1010 Alcohol abuse, uncomplicated: Secondary | ICD-10-CM | POA: Diagnosis present

## 2012-11-16 LAB — CBC WITH DIFFERENTIAL/PLATELET
Basophils Relative: 0 % (ref 0–1)
Eosinophils Absolute: 0 10*3/uL (ref 0.0–0.7)
Hemoglobin: 13.2 g/dL (ref 12.0–15.0)
MCH: 29.8 pg (ref 26.0–34.0)
MCHC: 33.8 g/dL (ref 30.0–36.0)
Monocytes Relative: 6 % (ref 3–12)
Neutrophils Relative %: 87 % — ABNORMAL HIGH (ref 43–77)

## 2012-11-16 LAB — URINALYSIS, ROUTINE W REFLEX MICROSCOPIC
Bilirubin Urine: NEGATIVE
Ketones, ur: >80 mg/dL — AB
Nitrite: NEGATIVE
Protein, ur: NEGATIVE mg/dL
Specific Gravity, Urine: 1.033 — ABNORMAL HIGH (ref 1.005–1.030)
Urobilinogen, UA: 0.2 mg/dL (ref 0.0–1.0)

## 2012-11-16 LAB — BLOOD GAS, ARTERIAL
Acid-base deficit: 5.4 mmol/L — ABNORMAL HIGH (ref 0.0–2.0)
Drawn by: 31276
FIO2: 0.21 %
O2 Saturation: 97.2 %
TCO2: 19.7 mmol/L (ref 0–100)

## 2012-11-16 LAB — RAPID URINE DRUG SCREEN, HOSP PERFORMED
Amphetamines: NOT DETECTED
Barbiturates: NOT DETECTED
Benzodiazepines: NOT DETECTED
Cocaine: NOT DETECTED
Tetrahydrocannabinol: NOT DETECTED

## 2012-11-16 LAB — BASIC METABOLIC PANEL
BUN: 16 mg/dL (ref 6–23)
BUN: 13 mg/dL (ref 6–23)
Calcium: 9.5 mg/dL (ref 8.4–10.5)
Calcium: 8.2 mg/dL — ABNORMAL LOW (ref 8.4–10.5)
Creatinine, Ser: 1.22 mg/dL — ABNORMAL HIGH (ref 0.50–1.10)
Creatinine, Ser: 0.98 mg/dL (ref 0.50–1.10)
GFR calc Af Amer: 63 mL/min — ABNORMAL LOW (ref >90)
GFR calc non Af Amer: 54 mL/min — ABNORMAL LOW (ref >90)
GFR calc non Af Amer: 71 mL/min — ABNORMAL LOW (ref >90)
Glucose, Bld: 193 mg/dL — ABNORMAL HIGH (ref 70–99)
Potassium: 3.9 mEq/L (ref 3.5–5.1)

## 2012-11-16 LAB — HEPATIC FUNCTION PANEL
Alkaline Phosphatase: 122 U/L — ABNORMAL HIGH (ref 39–117)
Indirect Bilirubin: 0.4 mg/dL (ref 0.3–0.9)
Total Bilirubin: 0.6 mg/dL (ref 0.3–1.2)

## 2012-11-16 LAB — CBC
HCT: 33.1 % — ABNORMAL LOW (ref 36.0–46.0)
Hemoglobin: 11.3 g/dL — ABNORMAL LOW (ref 12.0–15.0)
MCHC: 34.1 g/dL (ref 30.0–36.0)
RBC: 3.79 MIL/uL — ABNORMAL LOW (ref 3.87–5.11)
WBC: 8.3 10*3/uL (ref 4.0–10.5)

## 2012-11-16 LAB — MRSA PCR SCREENING: MRSA by PCR: NEGATIVE

## 2012-11-16 LAB — GLUCOSE, CAPILLARY: Glucose-Capillary: 330 mg/dL — ABNORMAL HIGH (ref 70–99)

## 2012-11-16 MED ORDER — GI COCKTAIL ~~LOC~~
30.0000 mL | Freq: Once | ORAL | Status: AC
  Administered 2012-11-17: 30 mL via ORAL
  Filled 2012-11-16: qty 30

## 2012-11-16 MED ORDER — SODIUM CHLORIDE 0.9 % IV SOLN: 1000.0000 mL | Freq: Once | INTRAVENOUS | Status: DC

## 2012-11-16 MED ORDER — PANTOPRAZOLE SODIUM 40 MG PO TBEC: 40.0000 mg | DELAYED_RELEASE_TABLET | Freq: Every day | ORAL | Status: DC

## 2012-11-16 MED ORDER — PANTOPRAZOLE SODIUM 40 MG IV SOLR
40.0000 mg | Freq: Every day | INTRAVENOUS | Status: DC
  Administered 2012-11-16: 40 mg via INTRAVENOUS
  Filled 2012-11-16 (×2): qty 40

## 2012-11-16 MED ORDER — ONDANSETRON 8 MG/NS 50 ML IVPB
8.0000 mg | Freq: Four times a day (QID) | INTRAVENOUS | Status: DC | PRN
  Filled 2012-11-16: qty 8

## 2012-11-16 MED ORDER — SODIUM CHLORIDE 0.9 % IV SOLN: 1000.0000 mL | INTRAVENOUS | Status: DC

## 2012-11-16 MED ORDER — ONDANSETRON HCL 4 MG/2ML IJ SOLN
4.0000 mg | Freq: Once | INTRAMUSCULAR | Status: AC
Start: 2012-11-16 — End: 2012-11-16
  Administered 2012-11-16: 4 mg via INTRAVENOUS
  Filled 2012-11-16: qty 2

## 2012-11-16 MED ORDER — SODIUM CHLORIDE 0.9 % IV SOLN
INTRAVENOUS | Status: DC
  Filled 2012-11-16: qty 1

## 2012-11-16 MED ORDER — MORPHINE SULFATE 2 MG/ML IJ SOLN
1.0000 mg | INTRAMUSCULAR | Status: DC | PRN
  Administered 2012-11-16 – 2012-11-18 (×8): 1 mg via INTRAVENOUS
  Filled 2012-11-16 (×8): qty 1

## 2012-11-16 MED ORDER — SODIUM CHLORIDE 0.9 % IV SOLN
INTRAVENOUS | Status: DC
  Administered 2012-11-16: 1.9 [IU]/h via INTRAVENOUS
  Administered 2012-11-16: 2.7 [IU]/h via INTRAVENOUS
  Administered 2012-11-16: 4.1 [IU]/h via INTRAVENOUS
  Filled 2012-11-16: qty 1

## 2012-11-16 MED ORDER — SODIUM CHLORIDE 0.9 % IV BOLUS (SEPSIS)
1000.0000 mL | Freq: Once | INTRAVENOUS | Status: AC
  Administered 2012-11-16: 1000 mL via INTRAVENOUS

## 2012-11-16 MED ORDER — INSULIN ASPART PROT & ASPART (70-30 MIX) 100 UNIT/ML ~~LOC~~ SUSP
10.0000 [IU] | Freq: Once | SUBCUTANEOUS | Status: DC
  Filled 2012-11-16: qty 10

## 2012-11-16 MED ORDER — INSULIN GLARGINE 100 UNIT/ML ~~LOC~~ SOLN
5.0000 [IU] | Freq: Every day | SUBCUTANEOUS | Status: DC
  Administered 2012-11-16 – 2012-11-17 (×2): 5 [IU] via SUBCUTANEOUS
  Filled 2012-11-16 (×4): qty 0.05

## 2012-11-16 MED ORDER — FOLIC ACID 1 MG PO TABS
1.0000 mg | ORAL_TABLET | Freq: Every day | ORAL | Status: DC
  Administered 2012-11-17 – 2012-11-20 (×4): 1 mg via ORAL
  Filled 2012-11-16 (×5): qty 1

## 2012-11-16 MED ORDER — POTASSIUM CHLORIDE 10 MEQ/100ML IV SOLN
10.0000 meq | INTRAVENOUS | Status: AC
  Administered 2012-11-16 – 2012-11-17 (×4): 10 meq via INTRAVENOUS
  Filled 2012-11-16: qty 400

## 2012-11-16 MED ORDER — VITAMIN B-1 100 MG PO TABS
100.0000 mg | ORAL_TABLET | Freq: Every day | ORAL | Status: DC
  Administered 2012-11-17 – 2012-11-20 (×4): 100 mg via ORAL
  Filled 2012-11-16 (×5): qty 1

## 2012-11-16 MED ORDER — HEPARIN SODIUM (PORCINE) 5000 UNIT/ML IJ SOLN
5000.0000 [IU] | Freq: Three times a day (TID) | INTRAMUSCULAR | Status: DC
  Administered 2012-11-16 – 2012-11-20 (×9): 5000 [IU] via SUBCUTANEOUS
  Filled 2012-11-16 (×14): qty 1

## 2012-11-16 MED ORDER — SODIUM CHLORIDE 0.9 % IV SOLN
1000.0000 mL | Freq: Once | INTRAVENOUS | Status: AC
  Administered 2012-11-16: 1000 mL via INTRAVENOUS

## 2012-11-16 MED ORDER — INSULIN ASPART 100 UNIT/ML ~~LOC~~ SOLN: 0.0000 [IU] | Freq: Three times a day (TID) | SUBCUTANEOUS | Status: DC

## 2012-11-16 MED ORDER — ONDANSETRON HCL 4 MG/2ML IJ SOLN: 4.0000 mg | Freq: Three times a day (TID) | INTRAMUSCULAR | Status: DC | PRN

## 2012-11-16 MED ORDER — ADULT MULTIVITAMIN W/MINERALS CH
1.0000 | ORAL_TABLET | Freq: Every day | ORAL | Status: DC
  Administered 2012-11-17 – 2012-11-20 (×4): 1 via ORAL
  Filled 2012-11-16 (×5): qty 1

## 2012-11-16 MED ORDER — DEXTROSE-NACL 5-0.45 % IV SOLN
INTRAVENOUS | Status: DC
  Administered 2012-11-16: 21:00:00 via INTRAVENOUS

## 2012-11-16 MED ORDER — SODIUM CHLORIDE 0.9 % IV SOLN
INTRAVENOUS | Status: DC
  Administered 2012-11-17 – 2012-11-18 (×5): via INTRAVENOUS
  Administered 2012-11-19: 1000 mL via INTRAVENOUS
  Administered 2012-11-19: via INTRAVENOUS

## 2012-11-16 MED ORDER — PAROXETINE HCL 20 MG PO TABS
20.0000 mg | ORAL_TABLET | Freq: Every day | ORAL | Status: DC
  Administered 2012-11-17 – 2012-11-20 (×4): 20 mg via ORAL
  Filled 2012-11-16 (×4): qty 1

## 2012-11-16 MED ORDER — LORAZEPAM 1 MG PO TABS: 1.0000 mg | ORAL_TABLET | Freq: Four times a day (QID) | ORAL | Status: AC | PRN

## 2012-11-16 MED ORDER — DEXTROSE 50 % IV SOLN: 25.0000 mL | INTRAVENOUS | Status: DC | PRN

## 2012-11-16 MED ORDER — THIAMINE HCL 100 MG/ML IJ SOLN
100.0000 mg | Freq: Every day | INTRAMUSCULAR | Status: DC
  Administered 2012-11-16: 100 mg via INTRAVENOUS
  Filled 2012-11-16 (×2): qty 1

## 2012-11-16 MED ORDER — LORAZEPAM 2 MG/ML IJ SOLN: 1.0000 mg | Freq: Four times a day (QID) | INTRAMUSCULAR | Status: AC | PRN

## 2012-11-16 NOTE — ED Notes (Signed)
Pt here via ems for c/o n/v with aches and pains x3 days

## 2012-11-16 NOTE — H&P (Signed)
Family Medicine Teaching [facility] Admission History and Physical Service Pager: [phone]  Patient name: Christine [name] Medical record number: [id number] Date of birth: Jun 01, 1970 Age: 42 y.o. Gender: female  Primary Care Provider: Felix Pacini, DO Consultants: None  Code Status: Full   Chief Complaint: Hyperglycemia   Assessment and Plan: Christine [name] is a 42 y.o. year old female presenting with DKA . PMH is significant for DM I uncontrolled, HTN, substance abuse including alcohol abuse w/ daily alcohol use.   # AG Metabolic Acidosis secondary to mild DKA - Calculation of Delta Delta showing concomitant metabolic alkalosis most likely secondary to vomiting and Cl- loss -Will admit to SDU initially, under Dr. Deirdre Priest, attending on call. - Will start on glucostabilizer pending closing of AG and Bicarb.  Will get ABG to better assess Acid-Base Status. - BMET q 2 hrs x 4 and then spaced as needed.  Will also check UDS, EtOH, Lipase, and LFT's - When CBG <250, will add D5 into fluids as well as starting Lantus at 5 U (home dose 12 U.  Will switch back to NS @ 125 cc/hr after 3 hrs after getting lantus  -Telemetry, Vitals per unit -K+ 3.9, will replete w/ IVF.  May need to add on further PO pending BMETs -2 L NS bolus in ED, will continue @ 125 cc/hr -Zofran PRN for nausea/vomiting.  Most likely will see close in her AG secondary to replenishment of Cl- from GI loss.   #DM I, hard to control.  As above -Further, pt seen in pharmacy clinic on 6/12 due to hard to control CBG's.  Was continued on 15 U of Lantus as that time with instructions to decrease Lantus if CBG fasting <100.  At that time, pt had multiple CBG readings, random, under 200 and some as low as 50.  Was instructed to obtain post-prandial readings periodically to assess need for mealtime coverage. -Will discuss case with Dr. Raymondo Band for further management upon outpatient regimen to send home on.  -Last A1C 9.8 in March  2014.  Will repeat at this visit  #Dyspepsia secondary to DKA vs alcohol w/d -Most likely secondary to constant vomiting -Continue Protonix -Will try GI cocktail x 1  #Depression  -Recently started on Paxil, will continue  #Hx of GIB and dyspepsia -Continue Protonix  -GI referral in outpatient setting pending   #Hx of tobacco abuse -Nicotine patch PRN  #Hx of alcohol and substance abuse -Will get EtOH and UDS to evaluate -CIWA   FEN/GI: As above Prophylaxis: SQ heparin  Disposition: SDU pending improvement in AG  History of Present Illness: Christine [name] is a 42 y.o. year old female presenting with nausea vomiting and diarrhea found to have mild DKA on admission.  Pt states that she was in her normal state of health up until 5 days ago when she developed some diarrhea.  She states there was no blood in this but she had multiple bouts of diarrhea per day.  Around three days ago, she was getting ready to leave town for the weekend and developed nausea and vomiting, non blood/non bilious that continued over the past two days into today.  She tried to eat chicken broth during that time and drink plenty of Gatorade but continued to vomit.  Upon awakening today, pt continued to have vomiting and decided to go to the ED. As well, she has had some epigastric abdominal pain that has been ongoing over the last couple of days.  She  denies radiation in her Sx, changes in her pain after trying to eat, or recent trauma.   In the ED, multiple evaluations were performed including BMET w/ Na 137, Cl 86, Bicarb 16, Creatinine 1.22, nml CBC, uPreg neg, UA w/ >1000 glucose and Ketones >80.  She was given 2 L NS bolus along with started on an insulin drip and given Zofran for nausea.   She denies current radiating CP, SOB, orthopnea, edema, diarrhea, fever, chills, sweats, palpitations, HA, blurred vision, diplopia.   Pt lives at home with her children and states she has not been around any sick contacts  nor knows of her children being around sick contacts.  She smokes 0.5-1 ppd for the last 11 yrs and denies illicit drug use.  She does drink 2-3 beers per night, last drink around 48-72 hrs ago.   Review Of Systems: Per HPI   Patient Active Problem List   Diagnosis Date Noted  . Depression 09/29/2012  . Chronic diarrhea 09/03/2012  . Abdominal pain, unspecified site 07/18/2012  . Odynophagia 07/18/2012  . Neuropathic pain 01/29/2011  . DRUG ABUSE, HX OF 07/05/2009  . ANEMIA 09/28/2008  . HYPERLIPIDEMIA 09/12/2008  . TOBACCO USER 09/12/2008  . DIABETES MELLITUS, I 06/26/2006   Past Medical History: Past Medical History  Diagnosis Date  . Diabetes mellitus   . Hypertension   . Substance abuse    Past Surgical History: History reviewed. No pertinent past surgical history. Social History: History  Substance Use Topics  . Smoking status: Current Every Day Smoker -- 0.50 packs/day for 20 years    Types: Cigarettes  . Smokeless tobacco: Not on file     Comment: cutting down to   . Alcohol Use: Not on file   Family History: No family history on file. Allergies and Medications: No Known Allergies No current facility-administered medications on file prior to encounter.   Current Outpatient Prescriptions on File Prior to Encounter  Medication Sig Dispense Refill  . glucose blood (ONE TOUCH ULTRA TEST) test strip Use as instructed.   Qdispense - sufficient for 1 time daily testing.  100 each  12  . insulin glargine (LANTUS) 100 UNIT/ML injection Inject 0.15 mLs (15 Units total) into the skin daily. Increase 1 unit each AM until fasting CBG is 100  10 mL  0  . Insulin Syringe-Needle U-100 30G 0.5 ML MISC 1 Container by Does not apply route once. Dispense QS for 1 month supply of once daily injection  1 each  11  . pantoprazole (PROTONIX) 40 MG tablet Take 1 tablet (40 mg total) by mouth daily.  30 tablet  3  . PARoxetine (PAXIL) 20 MG tablet Take 1 tablet (20 mg total) by mouth  every morning.  30 tablet  1    Objective: BP 163/87  Pulse 82  Temp(Src) 97.1 F (36.2 C) (Axillary)  Resp 16  SpO2 100% Exam: General: Ill appearing, in bed HEENT: [place]/AT, Dry mucous membranes Cardiovascular: RRR, +3/6 SEM RUSB  Respiratory: CTA B/L  Abdomen: Soft, TTP epigastric, no HSM, NABS Extremities: No edema, no rashes, pulses + 2 B/L LE Skin: No rashes noted Neuro: No focal deficits   Labs and Imaging: CBC BMET   Recent Labs Lab 11/16/12 1252  WBC 9.7  HGB 13.2  HCT 39.0  PLT 209     Recent Labs Lab 11/16/12 1252  NA 137  K 3.9  CL 86*  CO2 16*  BUN 16  CREATININE 1.22*  GLUCOSE 468*  CALCIUM 9.5      Briscoe Deutscher, DO 11/16/2012, 6:08 PM PGY-2 [facility] Family Medicine FPTS Intern pager: [phone], text pages welcome

## 2012-11-16 NOTE — ED Notes (Signed)
Bed:WA02<BR> Expected date:<BR> Expected time:<BR> Means of arrival:<BR> Comments:<BR> ems

## 2012-11-16 NOTE — ED Provider Notes (Signed)
History    CSN: [id number] Arrival date & time 11/16/12  1239  First MD Initiated Contact with Patient 11/16/12 1251     Chief Complaint  Patient presents with  . Emesis  . Hyperglycemia   (Consider location/radiation/quality/duration/timing/severity/associated sxs/prior Treatment) HPI Comments: Patient is a 42 year old female with a past medical history of diabetes who presents with abdominal pain for the past 2 days. The pain is located in her generalized abdomen and does not radiate. The pain is described as cramping and severe. The pain started gradually and progressively worsened since the onset. No alleviating/aggravating factors. The patient has tried nothing for symptoms without relief. Associated symptoms include nausea and vomiting. Patient denies fever, headache, diarrhea, chest pain, SOB, dysuria, constipation. Patient reports being able to pass gas and have bowel movements.    Past Medical History  Diagnosis Date  . Diabetes mellitus   . Hypertension   . Substance abuse    History reviewed. No pertinent past surgical history. No family history on file. History  Substance Use Topics  . Smoking status: Current Every Day Smoker -- 0.50 packs/day for 20 years    Types: Cigarettes  . Smokeless tobacco: Not on file     Comment: cutting down to   . Alcohol Use: Not on file   OB History   Grav Para Term Preterm Abortions TAB SAB Ect Mult Living                 Review of Systems  Gastrointestinal: Positive for nausea, vomiting and abdominal pain.  All other systems reviewed and are negative.    Allergies  Review of patient's allergies indicates no known allergies.  Home Medications   Current Outpatient Rx  Name  Route  Sig  Dispense  Refill  . glucose blood (ONE TOUCH ULTRA TEST) test strip      Use as instructed.   Qdispense - sufficient for 1 time daily testing.   100 each   12   . insulin glargine (LANTUS) 100 UNIT/ML injection   Subcutaneous  Inject 0.15 mLs (15 Units total) into the skin daily. Increase 1 unit each AM until fasting CBG is 100   10 mL   0   . Insulin Syringe-Needle U-100 30G 0.5 ML MISC   Does not apply   1 Container by Does not apply route once. Dispense QS for 1 month supply of once daily injection   1 each   11   . lisinopril (PRINIVIL,ZESTRIL) 5 MG tablet   Oral   Take 0.5 tablets (2.5 mg total) by mouth daily.   90 tablet   3   . ondansetron (ZOFRAN ODT) 4 MG disintegrating tablet   Oral   Take 1 tablet (4 mg total) by mouth every 8 (eight) hours as needed for nausea.   15 tablet   0   . pantoprazole (PROTONIX) 40 MG tablet   Oral   Take 1 tablet (40 mg total) by mouth daily.   30 tablet   3   . PARoxetine (PAXIL) 20 MG tablet   Oral   Take 1 tablet (20 mg total) by mouth every morning.   30 tablet   1    BP 160/92  Pulse 108  Temp(Src) 97.1 F (36.2 C) (Axillary)  Resp 16  SpO2 100% Physical Exam  Nursing note and vitals reviewed. Constitutional: She is oriented to person, place, and time. She appears well-developed and well-nourished. No distress.  HENT:  Head: Normocephalic  and atraumatic.  Eyes: Conjunctivae are normal.  Neck: Normal range of motion.  Cardiovascular: Normal rate and regular rhythm.  Exam reveals no gallop and no friction rub.   No murmur heard. Pulmonary/Chest: Effort normal and breath sounds normal. She has no wheezes. She has no rales. She exhibits no tenderness.  Abdominal: Soft. She exhibits no distension. There is no tenderness. There is no rebound and no guarding.  Musculoskeletal: Normal range of motion.  Neurological: She is alert and oriented to person, place, and time. Coordination normal.  Speech is goal-oriented. Moves limbs without ataxia.   Skin: Skin is warm and dry.  Psychiatric: She has a normal mood and affect. Her behavior is normal.    ED Course  Procedures (including critical care time) Labs Reviewed  CBC WITH DIFFERENTIAL -  Abnormal; Notable for the following:    Neutrophils Relative % 87 (*)    Neutro Abs 8.4 (*)    Lymphocytes Relative 7 (*)    Lymphs Abs 0.6 (*)    All other components within normal limits  BASIC METABOLIC PANEL - Abnormal; Notable for the following:    Chloride 86 (*)    CO2 16 (*)    Glucose, Bld 468 (*)    Creatinine, Ser 1.22 (*)    GFR calc non Af Amer 54 (*)    GFR calc Af Amer 63 (*)    All other components within normal limits  URINALYSIS, ROUTINE W REFLEX MICROSCOPIC - Abnormal; Notable for the following:    Specific Gravity, Urine 1.033 (*)    Glucose, UA >1000 (*)    Ketones, ur >80 (*)    All other components within normal limits  URINE CULTURE  PREGNANCY, URINE  URINE MICROSCOPIC-ADD ON   No results found.  1. DKA (diabetic ketoacidoses)     MDM  12:58 PM Labs pending. Patient will have fluids and zofran. Patient is tachycardic and afebrile.   3:09 PM Patient has a blood glucose of 486 and anion gap of 35. Patient will be admitted for DKA. Patient started on glucose stabilizer.   3:47 PM Patient will be admitted to [facility] to Dr. Leveda Anna.   Emilia Beck, PA-C 11/16/12 1547

## 2012-11-17 DIAGNOSIS — R109 Unspecified abdominal pain: Secondary | ICD-10-CM

## 2012-11-17 DIAGNOSIS — E1110 Type 2 diabetes mellitus with ketoacidosis without coma: Secondary | ICD-10-CM

## 2012-11-17 DIAGNOSIS — R197 Diarrhea, unspecified: Secondary | ICD-10-CM

## 2012-11-17 LAB — URINE CULTURE: Culture: NO GROWTH

## 2012-11-17 LAB — BASIC METABOLIC PANEL
BUN: 12 mg/dL (ref 6–23)
CO2: 19 mEq/L (ref 19–32)
CO2: 22 mEq/L (ref 19–32)
Calcium: 8.1 mg/dL — ABNORMAL LOW (ref 8.4–10.5)
Calcium: 8.3 mg/dL — ABNORMAL LOW (ref 8.4–10.5)
Calcium: 8.4 mg/dL (ref 8.4–10.5)
Chloride: 103 mEq/L (ref 96–112)
Creatinine, Ser: 0.84 mg/dL (ref 0.50–1.10)
Creatinine, Ser: 0.92 mg/dL (ref 0.50–1.10)
Creatinine, Ser: 0.82 mg/dL (ref 0.50–1.10)
GFR calc Af Amer: >90 mL/min (ref >90)
GFR calc Af Amer: 88 mL/min — ABNORMAL LOW (ref >90)
GFR calc Af Amer: >90 mL/min (ref >90)
GFR calc Af Amer: >90 mL/min (ref >90)
GFR calc non Af Amer: 85 mL/min — ABNORMAL LOW (ref >90)
GFR calc non Af Amer: >90 mL/min (ref >90)
Sodium: 137 mEq/L (ref 135–145)

## 2012-11-17 LAB — GLUCOSE, CAPILLARY
Glucose-Capillary: 260 mg/dL — ABNORMAL HIGH (ref 70–99)
Glucose-Capillary: 185 mg/dL — ABNORMAL HIGH (ref 70–99)
Glucose-Capillary: 94 mg/dL (ref 70–99)

## 2012-11-17 MED ORDER — INSULIN ASPART 100 UNIT/ML ~~LOC~~ SOLN
0.0000 [IU] | Freq: Three times a day (TID) | SUBCUTANEOUS | Status: DC
  Administered 2012-11-17: 2 [IU] via SUBCUTANEOUS
  Administered 2012-11-17: 5 [IU] via SUBCUTANEOUS
  Administered 2012-11-17: 1 [IU] via SUBCUTANEOUS
  Administered 2012-11-18: 2 [IU] via SUBCUTANEOUS
  Administered 2012-11-18: 5 [IU] via SUBCUTANEOUS
  Administered 2012-11-18: 2 [IU] via SUBCUTANEOUS
  Administered 2012-11-19: 1 [IU] via SUBCUTANEOUS
  Administered 2012-11-20: 2 [IU] via SUBCUTANEOUS

## 2012-11-17 MED ORDER — POTASSIUM CHLORIDE 20 MEQ/15ML (10%) PO LIQD
40.0000 meq | Freq: Two times a day (BID) | ORAL | Status: AC
  Administered 2012-11-17 – 2012-11-18 (×3): 40 meq via ORAL
  Filled 2012-11-17 (×3): qty 30

## 2012-11-17 MED ORDER — GI COCKTAIL ~~LOC~~
30.0000 mL | Freq: Three times a day (TID) | ORAL | Status: DC | PRN
  Filled 2012-11-17: qty 30

## 2012-11-17 MED ORDER — PANTOPRAZOLE SODIUM 40 MG PO TBEC
40.0000 mg | DELAYED_RELEASE_TABLET | Freq: Every day | ORAL | Status: DC
  Administered 2012-11-17 – 2012-11-20 (×4): 40 mg via ORAL
  Filled 2012-11-17 (×4): qty 1

## 2012-11-17 MED ORDER — INSULIN ASPART 100 UNIT/ML ~~LOC~~ SOLN
5.0000 [IU] | Freq: Once | SUBCUTANEOUS | Status: AC
  Administered 2012-11-17: 5 [IU] via SUBCUTANEOUS

## 2012-11-17 MED ORDER — SODIUM CHLORIDE 0.9 % IV BOLUS (SEPSIS)
1000.0000 mL | Freq: Once | INTRAVENOUS | Status: AC
  Administered 2012-11-17: 1000 mL via INTRAVENOUS

## 2012-11-17 MED ORDER — BOOST / RESOURCE BREEZE PO LIQD
1.0000 | Freq: Every day | ORAL | Status: DC
  Administered 2012-11-17 – 2012-11-19 (×3): 1 via ORAL

## 2012-11-17 MED ORDER — HYDRALAZINE HCL 20 MG/ML IJ SOLN
10.0000 mg | INTRAMUSCULAR | Status: DC | PRN
  Administered 2012-11-18: 10 mg via INTRAVENOUS
  Filled 2012-11-17: qty 1

## 2012-11-17 NOTE — H&P (Signed)
Family Medicine Teaching Service Attending Note  I interviewed and examined patient Christine [name] and reviewed their tests and x-rays.  I discussed with Dr. Paulina Fusi and reviewed their note for today.  I agree with their assessment and plan.     Additionally  Feeling some better Still with chest pain upper abdomen pain Alert oriented Abdomen - mild epig tenderness no guarding or rebound  Would continue IVF (increase still looks dry) and insulin and follow bmets Check chest xray looking for signs of esoph rupture with her persistent vomting and epig chest pain

## 2012-11-17 NOTE — ED Provider Notes (Signed)
Medical screening examination/treatment/procedure(s) were performed by non-physician practitioner and as supervising physician I was immediately available for consultation/collaboration.  Jazmaine Fuelling T Vala Raffo, MD 11/17/12 0757 

## 2012-11-17 NOTE — Progress Notes (Signed)
Family Medicine Teaching Service Daily Progress Note Intern Pager: [phone]  Patient name: Christine [name] Medical record number: [id number] Date of birth: 07-Apr-1971 Age: 42 y.o. Gender: female  Primary Care Provider: Felix Pacini, DO Consultants: none Code Status: full  Pt Overview and Major Events to Date:  7/21 - Admitted in DKA   Assessment and Plan: Christine [name] is a 42 y.o. year old female presenting with DKA . PMH is significant for DM I uncontrolled, HTN, substance abuse including alcohol abuse w/ daily alcohol use.   # AG Metabolic Acidosis secondary to mild DKA - Calculation of Delta Delta showing concomitant metabolic alkalosis most likely secondary to vomiting and Cl- loss  - started on glucostabilizer, off when gap closed and lantus given - BMET q 24 hrs  - Telemetry, Vitals per unit  - K+ 4.1 this am - 2 L NS bolus in ED, will continue @ 125 cc/hr  - Zofran PRN for nausea/vomiting. - Gap still 16 at 10am, give 5u insulin + 1L NS bolus  # DM I, hard to control. As above  -Further, pt seen in pharmacy clinic on 6/12 due to hard to control CBG's. Was continued on 15 U of Lantus as that time with instructions to decrease Lantus if CBG fasting <100. At that time, pt had multiple CBG readings, random, under 200 and some as low as 50. Was instructed to obtain post-prandial readings periodically to assess need for mealtime coverage.  -Will discuss case with Dr. Raymondo Band for further management upon outpatient regimen to send home on.  -Last A1C 9.8 in March 2014. 10.1 this hospitalization  # HTN: chronic, previously treated, stopped in May but unclear why - Running 140s-170s here - Ask Claiborne Billings about reason it was d/c'ed, may need to restart ACE at least  # Dyspepsia secondary to DKA vs alcohol w/d  -Most likely secondary to constant vomiting  -Continue Protonix  -Pt reports relief w/ GI cocktail on admission, will repeat   # Depression  -Recently started on Paxil, will  continue   # Hx of GIB and dyspepsia  -Continue Protonix  -GI referral in outpatient setting pending   # Hx of tobacco abuse  -Nicotine patch PRN   # Hx of alcohol and substance abuse  - UDS neg, alc <11  - CIWA score 3, no ativan given - Lipase 9, AST 46, ALT 27, Alk phos 122  FEN/GI: As above  Prophylaxis: SQ heparin   Disposition: SDU pending improvement in AG  Subjective: Feeling better, still having esophageal irritation/epigastric pain, less nausea and able to eat breakfast  Objective: Temp:  [97.1 F (36.2 C)-98.3 F (36.8 C)] 97.7 F (36.5 C) (07/22 0813) Pulse Rate:  [68-108] 81 (07/22 0813) Resp:  [10-16] 10 (07/22 0813) BP: (143-174)/(77-97) 164/91 mmHg (07/22 0813) SpO2:  [98 %-100 %] 99 % (07/22 0813) Weight:  [116 lb 6.5 oz (52.8 kg)] 116 lb 6.5 oz (52.8 kg) (07/22 0447) Physical Exam: General: Ill appearing, in bed  HEENT: [employer]/AT, Dry mucous membranes  Cardiovascular: RRR, +3/6 SEM RUSB  Respiratory: CTA B/L  Abdomen: Soft, TTP epigastric, no HSM, NABS  Extremities: No edema, no rashes, pulses + 2 B/L LE  Skin: No rashes noted  Neuro: No focal deficits   Laboratory:  Recent Labs Lab 11/16/12 1252 11/16/12 2041  WBC 9.7 8.3  HGB 13.2 11.3*  HCT 39.0 33.1*  PLT 209 157    Recent Labs Lab 11/16/12 2041 11/17/12 0005 11/17/12 0555 11/17/12 0940  NA 142 138 137 137  K 3.1* 3.8 4.1 3.3*  CL 105 103 102 103  CO2 20 18* 19 18*  BUN 13 12 11 10   CREATININE 0.98 0.84 0.92 0.82  CALCIUM 8.2* 8.1* 8.3* 8.3*  PROT 6.0  --   --   --   BILITOT 0.6  --   --   --   ALKPHOS 122*  --   --   --   ALT 27  --   --   --   AST 46*  --   --   --   GLUCOSE 193* 249* 255* 234*    Beverely Low, MD 11/17/2012, 12:38 PM PGY-1, [facility] Family Medicine FPTS Intern pager: [phone], text pages welcome

## 2012-11-17 NOTE — Progress Notes (Signed)
Discussed in rounds.  Agree with Dr. Richarda Blade.  Seen today by attending, Dr. Deirdre Priest.

## 2012-11-17 NOTE — Progress Notes (Signed)
Utilization Review Completed.  

## 2012-11-17 NOTE — Progress Notes (Addendum)
INITIAL NUTRITION ASSESSMENT  DOCUMENTATION CODES Per approved criteria  -Severe malnutrition in the context of chronic illness -Underweight   INTERVENTION:  Resource Breeze daily (250 kcals, 9 gm protein per 8 fl oz carton)  Continue multivitamin daily RD to follow for nutrition care plan  NUTRITION DIAGNOSIS: Inadequate oral intake related to poor appetite, epigastric pain as evidenced by patient report  Goal: Oral intake with meals & supplements to meet >/= 90% of estimated nutrition needs  Monitor:  PO & supplemental intake, weight, labs, I/O's  Reason for Assessment: Malnutrition Screening Tool Report  42 y.o. female  Admitting Dx: DKA (diabetic ketoacidoses)  ASSESSMENT: Patient with PMH significant for DM I uncontrolled, HTN, substance abuse & alcohol abuse; presented with N/V/D and found to have DKA.  Patient sleepy upon RD visitation; was able to report a poor appetite and hx of progressive weight loss; RD suspects patient more than likely not meeting estimated protein needs given alcoholism; she doesn't like Ensure; RD to order [employer] ---> hopefully she will like.  Patient meets criteria for severe malnutrition in the context of chronic illness as evidenced by < 75% intake of estimated energy requirement for > 1 month and severe muscle loss (clavicles, shoulders) and subcutaneous fat loss (biceps/triceps).  Height: Ht Readings from Last 1 Encounters:  11/16/12 5\' 9"  (1.753 m)    Weight: Wt Readings from Last 1 Encounters:  11/17/12 116 lb 6.5 oz (52.8 kg)    Ideal Body Weight: 145 lb  % Ideal Body Weight: 80%  Wt Readings from Last 10 Encounters:  11/17/12 116 lb 6.5 oz (52.8 kg)  10/08/12 122 lb (55.339 kg)  09/29/12 123 lb 9.6 oz (56.065 kg)  09/03/12 117 lb 4.8 oz (53.207 kg)  07/17/12 124 lb (56.246 kg)  08/21/11 161 lb (73.029 kg)  05/09/11 160 lb (72.576 kg)  01/29/11 153 lb 11.2 oz (69.718 kg)  11/28/10 150 lb 4.8 oz (68.176 kg)   11/20/10 154 lb (69.854 kg)    Usual Body Weight: 122 lb  % Usual Body Weight: 95%  BMI:  Body mass index is 17.18 kg/(m^2).  Estimated Nutritional Needs: Kcal: 1500-1700 Protein: 70-80 gm Fluid: 1.5-1.7 L  Skin: Intact  Diet Order: Full Liquid  EDUCATION NEEDS: -No education needs identified at this time   Intake/Output Summary (Last 24 hours) at 11/17/12 1426 Last data filed at 11/17/12 1300  Gross per 24 hour  Intake 3298.75 ml  Output    525 ml  Net 2773.75 ml    Labs:   Recent Labs Lab 11/17/12 0005 11/17/12 0555 11/17/12 0940  NA 138 137 137  K 3.8 4.1 3.3*  CL 103 102 103  CO2 18* 19 18*  BUN 12 11 10   CREATININE 0.84 0.92 0.82  CALCIUM 8.1* 8.3* 8.3*  GLUCOSE 249* 255* 234*    CBG (last 3)   Recent Labs  11/16/12 2214 11/17/12 0147 11/17/12 0749  GLUCAP 172* 185* 260*    Scheduled Meds: . folic acid  1 mg Oral Daily  . heparin  5,000 Units Subcutaneous Q8H  . insulin aspart  0-9 Units Subcutaneous TID WC  . insulin glargine  5 Units Subcutaneous QHS  . multivitamin with minerals  1 tablet Oral Daily  . pantoprazole  40 mg Oral Daily  . PARoxetine  20 mg Oral Daily  . thiamine  100 mg Oral Daily    Continuous Infusions: . sodium chloride 125 mL/hr at 11/17/12 1348    Past Medical History  Diagnosis Date  . Diabetes mellitus   . Hypertension   . Substance abuse     History reviewed. No pertinent past surgical history.  Maureen Chatters, RD, LDN Pager #: [phone] After-Hours Pager #: [phone]

## 2012-11-18 DIAGNOSIS — E785 Hyperlipidemia, unspecified: Secondary | ICD-10-CM

## 2012-11-18 DIAGNOSIS — F17200 Nicotine dependence, unspecified, uncomplicated: Secondary | ICD-10-CM

## 2012-11-18 DIAGNOSIS — E43 Unspecified severe protein-calorie malnutrition: Secondary | ICD-10-CM | POA: Insufficient documentation

## 2012-11-18 DIAGNOSIS — E109 Type 1 diabetes mellitus without complications: Secondary | ICD-10-CM

## 2012-11-18 DIAGNOSIS — IMO0002 Reserved for concepts with insufficient information to code with codable children: Secondary | ICD-10-CM

## 2012-11-18 DIAGNOSIS — D649 Anemia, unspecified: Secondary | ICD-10-CM

## 2012-11-18 LAB — BASIC METABOLIC PANEL
BUN: 6 mg/dL (ref 6–23)
BUN: 5 mg/dL — ABNORMAL LOW (ref 6–23)
CO2: 20 mEq/L (ref 19–32)
Calcium: 8.7 mg/dL (ref 8.4–10.5)
Chloride: 102 mEq/L (ref 96–112)
GFR calc Af Amer: >90 mL/min (ref >90)
GFR calc non Af Amer: >90 mL/min (ref >90)
GFR calc non Af Amer: >90 mL/min (ref >90)
Glucose, Bld: 271 mg/dL — ABNORMAL HIGH (ref 70–99)
Glucose, Bld: 149 mg/dL — ABNORMAL HIGH (ref 70–99)
Potassium: 3.8 mEq/L (ref 3.5–5.1)
Sodium: 133 mEq/L — ABNORMAL LOW (ref 135–145)
Sodium: 135 mEq/L (ref 135–145)

## 2012-11-18 LAB — CBC
HCT: 32 % — ABNORMAL LOW (ref 36.0–46.0)
Hemoglobin: 10.5 g/dL — ABNORMAL LOW (ref 12.0–15.0)
MCH: 29.2 pg (ref 26.0–34.0)
MCHC: 32.8 g/dL (ref 30.0–36.0)
RBC: 3.59 MIL/uL — ABNORMAL LOW (ref 3.87–5.11)

## 2012-11-18 LAB — GLUCOSE, CAPILLARY
Glucose-Capillary: 131 mg/dL — ABNORMAL HIGH (ref 70–99)
Glucose-Capillary: 279 mg/dL — ABNORMAL HIGH (ref 70–99)

## 2012-11-18 MED ORDER — LISINOPRIL 5 MG PO TABS
5.0000 mg | ORAL_TABLET | Freq: Every day | ORAL | Status: DC
  Administered 2012-11-18 – 2012-11-19 (×2): 5 mg via ORAL
  Filled 2012-11-18 (×3): qty 1

## 2012-11-18 MED ORDER — INSULIN GLARGINE 100 UNIT/ML ~~LOC~~ SOLN
10.0000 [IU] | Freq: Every day | SUBCUTANEOUS | Status: DC
  Administered 2012-11-18 – 2012-11-19 (×2): 10 [IU] via SUBCUTANEOUS
  Filled 2012-11-18 (×3): qty 0.1

## 2012-11-18 NOTE — Progress Notes (Signed)
Seen and examined.  Agree with Dr. Caleb Popp.  I am a bit worried about Christine [name].  Not her dehydration and DKA which is coming under control.  My concern is her persistent nausea without clear dx (suspect an element of diabetic gastroparesis)  Adding to my worries is that she has had a 40lb unexplained wt loss over the past year.  The only abd imaging study we have is an ultrasound which showed a liver lesion.  I think we should be more aggressive in the GI work up and would recommend starting with the MRI with contrast as recommended with the ultrasound.

## 2012-11-18 NOTE — Progress Notes (Addendum)
Inpatient Diabetes Program Recommendations  AACE/ADA: New Consensus Statement on Inpatient Glycemic Control (2013)  Target Ranges:  Prepandial:   less than 140 mg/dL      Peak postprandial:   less than 180 mg/dL (1-2 hours)      Critically ill patients:  140 - 180 mg/dL    Results for Christine [name], Christine [name] (MRN [id number]) as of 11/18/2012 09:29  Ref. Range 11/17/2012 07:49 11/17/2012 12:37 11/17/2012 15:18 11/17/2012 16:54 11/17/2012 20:09  Glucose-Capillary Latest Range: 70-99 mg/dL 409 (H) 811 (H) 94 914 (H) 171 (H)    Results for Christine [name], Christine [name] (MRN [id number]) as of 11/18/2012 09:29  Ref. Range 11/18/2012 00:14 11/18/2012 04:35 11/18/2012 08:14  Glucose-Capillary Latest Range: 70-99 mg/dL 086 (H) 578 (H) 469 (H)    Fasting glucose levels elevated- Patient received 5 units of Lantus last night.    MD- Please consider increasing Lantus to 10 units daily.  **Noted patient was seen at the Internal Medicine clinic on 03/21 and 05/08 of this year.  Her PCP at the clinic has been working with her to improve her CBG control at home.  Noted patient has issues with non-adherence and ETOH abuse.   Will follow.Ambrose Finland RN, MSN, CDE Diabetes Coordinator Inpatient Diabetes Program [phone]

## 2012-11-18 NOTE — Progress Notes (Signed)
Pt transferred to unit 6East from unit 2600. Pt is alert and oriented to staff, call bell, and room. Bed in lowest position. Call bell within reach. Full assessment to Epic. Will continue to monitor. Fayne Norrie, RN

## 2012-11-18 NOTE — Progress Notes (Signed)
Family Medicine Teaching Service Daily Progress Note Intern Pager: [phone]  Patient name: Christine [name] Medical record number: [id number] Date of birth: Mar 05, 1971 Age: 42 y.o. Gender: female  Primary Care Provider: Felix Pacini, DO Consultants: none Code Status: full  Pt Overview and Major Events to Date:  7/21 - Admitted in DKA   Assessment and Plan: Christine [name] is a 42 y.o. year old female presenting with DKA . PMH is significant for DM I uncontrolled, HTN, substance abuse including alcohol abuse w/ daily alcohol use.   # AG Metabolic Acidosis secondary to mild DKA - Calculation of Delta Delta showing concomitant metabolic alkalosis most likely secondary to vomiting and Cl- loss  - started on glucostabilizer, off when gap closed and lantus given - BMET q 24 hrs  - Telemetry, Vitals per unit  - K+ 4.1 this am - 2 L NS bolus in ED, will continue @ 125 cc/hr  - Zofran PRN for nausea/vomiting. - Gap closed to 8 yesterday afternoon, up to 11 today [ ]  f/u Bmet  # DM I, hard to control. As above. Last CBG: 191; received 13 units aspart over 24 hours -Further, pt seen in pharmacy clinic on 6/12 due to hard to control CBG's. Was continued on 15 U of Lantus as that time with instructions to decrease Lantus if CBG fasting <100. At that time, pt had multiple CBG readings, random, under 200 and some as low as 50. Was instructed to obtain post-prandial readings periodically to assess need for mealtime coverage.  -Will discuss case with Dr. Raymondo Band for further management upon outpatient regimen to send home on.  -Last A1C 9.8 in March 2014. 10.1 this hospitalization - increase to lantus 10 units  # HTN: chronic - Running 140s-170s here - start lisinopril 5mg   # Dyspepsia secondary to DKA vs alcohol w/d  -Most likely secondary to constant vomiting -Continue Protonix   # Depression  -Recently started on Paxil, will continue   # Hx of GIB and dyspepsia  -Continue Protonix  -GI  referral in outpatient setting pending   # Hx of tobacco abuse  -Nicotine patch PRN   # Hx of alcohol and substance abuse  - UDS neg, alc <11  - CIWA score 3, no ativan given - Lipase 9, AST 46, ALT 27, Alk phos 122  FEN/GI: As above  Prophylaxis: SQ heparin   Disposition: telemetry  Subjective: Continues to tolerate food by mouth with no nausea or vomiting. No complaints over night.  Objective: Temp:  [97.9 F (36.6 C)-98.6 F (37 C)] 98.3 F (36.8 C) (07/23 0812) Pulse Rate:  [70-83] 83 (07/23 0812) Resp:  [11-16] 12 (07/23 0812) BP: (131-170)/(78-95) 150/85 mmHg (07/23 0812) SpO2:  [99 %-100 %] 99 % (07/23 0812) Weight:  [116 lb 6.5 oz (52.8 kg)] 116 lb 6.5 oz (52.8 kg) (07/23 0419)  Physical Exam: General: laying in bed, comfortable, in no acute distress Cardiovascular: RRR, +3/6 SEM RUSB  Respiratory: clear to auscultation bilaterally, no wheezes  Abdomen: Soft, TTP epigastric, no HSM, NABS  Extremities: No edema, no rashes  Skin: No rashes noted  Neuro: No focal deficits, alert & oriented x3  Laboratory:  Recent Labs Lab 11/16/12 1252 11/16/12 2041 11/18/12 0805  WBC 9.7 8.3 4.3  HGB 13.2 11.3* 10.5*  HCT 39.0 33.1* 32.0*  PLT 209 157 127*    Recent Labs Lab 11/16/12 2041  11/17/12 0940 11/17/12 1438 11/18/12 0805  NA 142  < > 137 137 133*  K  3.1*  < > 3.3* 3.2* 3.8  CL 105  < > 103 107 102  CO2 20  < > 18* 22 20  BUN 13  < > 10 9 6   CREATININE 0.98  < > 0.82 0.80 0.72  CALCIUM 8.2*  < > 8.3* 8.4 8.6  PROT 6.0  --   --   --   --   BILITOT 0.6  --   --   --   --   ALKPHOS 122*  --   --   --   --   ALT 27  --   --   --   --   AST 46*  --   --   --   --   GLUCOSE 193*  < > 234* 109* 271*  < > = values in this interval not displayed.  Jacquelin Hawking, MD 11/18/2012, 9:15 AM PGY-1, [facility] Family Medicine FPTS Intern pager: [phone], text pages welcome

## 2012-11-19 ENCOUNTER — Encounter (HOSPITAL_COMMUNITY): Payer: Self-pay | Admitting: *Deleted

## 2012-11-19 ENCOUNTER — Inpatient Hospital Stay (HOSPITAL_COMMUNITY): Payer: BC Managed Care – PPO

## 2012-11-19 DIAGNOSIS — E43 Unspecified severe protein-calorie malnutrition: Secondary | ICD-10-CM

## 2012-11-19 DIAGNOSIS — R1310 Dysphagia, unspecified: Secondary | ICD-10-CM

## 2012-11-19 LAB — BASIC METABOLIC PANEL
CO2: 24 mEq/L (ref 19–32)
Calcium: 8.4 mg/dL (ref 8.4–10.5)
Creatinine, Ser: 0.74 mg/dL (ref 0.50–1.10)
Glucose, Bld: 153 mg/dL — ABNORMAL HIGH (ref 70–99)

## 2012-11-19 LAB — GLUCOSE, CAPILLARY: Glucose-Capillary: 149 mg/dL — ABNORMAL HIGH (ref 70–99)

## 2012-11-19 MED ORDER — GADOXETATE DISODIUM 0.25 MMOL/ML IV SOLN
5.0000 mL | Freq: Once | INTRAVENOUS | Status: AC | PRN
  Administered 2012-11-19: 5 mL via INTRAVENOUS

## 2012-11-19 MED ORDER — METOCLOPRAMIDE HCL 10 MG PO TABS
10.0000 mg | ORAL_TABLET | Freq: Two times a day (BID) | ORAL | Status: DC
  Administered 2012-11-19 – 2012-11-20 (×2): 10 mg via ORAL
  Filled 2012-11-19 (×4): qty 1

## 2012-11-19 NOTE — Progress Notes (Signed)
Seen and examined.  Agree with Dr. Richarda Blade.  Patient tired and not taking PO.  I continue to worry that we are missing something.

## 2012-11-19 NOTE — Progress Notes (Signed)
I have seen patient and discussed current hospitalization. I agree with the excellent care provided by the primary team of [facility] Medicine Teaching Service and want to thank them for their continued efforts in caring for my patient.   Felix Pacini, DO PGY-2

## 2012-11-19 NOTE — Progress Notes (Signed)
Family Medicine Teaching Service Daily Progress Note Intern Pager: [phone]  Patient name: Christine [name] Medical record number: [id number] Date of birth: [birth year] Age: 42 y.o. Gender: female  Primary Care Provider: Felix Pacini, DO Consultants: none Code Status: full  Pt Overview and Major Events to Date:  7/21 - Admitted in DKA   Assessment and Plan: Christine [name] is a 42 y.o. year old female presenting with DKA . PMH is significant for DM I uncontrolled, HTN, substance abuse including alcohol abuse w/ daily alcohol use.   # AG Metabolic Acidosis secondary to mild DKA - Calculation of Delta Delta showing concomitant metabolic alkalosis most likely secondary to vomiting and Cl- loss  - started on glucostabilizer, off when gap closed and lantus given - BMET q 24 hrs  - Telemetry, Vitals per unit  - K+ 4.1 this am - 2 L NS bolus in ED, will continue @ 125 cc/hr  - Zofran PRN for nausea/vomiting. - Gap closed to 8 7/22, up to 11 7/23 am then 8 again pm [ ]  f/u Bmet  # Nausea: w/ vomiting chronically, no clear etiology - Abd u/s in may showed liver lesion likely fatty infiltrate vs. Hemangioma as well as pancreatic calcifications  [ ]  Abd MRI   # DM I, hard to control. As above. CBG: 149-196 yest; received 9 units aspart over 24 hours -Further, pt seen in pharmacy clinic on 6/12 due to hard to control CBG's. Was continued on 15 U of Lantus as that time with instructions to decrease Lantus if CBG fasting <100. At that time, pt had multiple CBG readings, random, under 200 and some as low as 50. Was instructed to obtain post-prandial readings periodically to assess need for mealtime coverage.  -Will discuss case with Dr. Raymondo Band for further management upon outpatient regimen to send home on.  -Last A1C 9.8 in March 2014. 10.1 this hospitalization - continue lantus 10 units  # HTN: chronic - Running 140s-150s now - started lisinopril 5mg  7/23  # Dyspepsia secondary to DKA vs alcohol  w/d  -Most likely secondary to constant vomiting -Continue Protonix   # Depression  -Recently started on Paxil, will continue   # Hx of GIB and dyspepsia  -Continue Protonix  -GI referral in outpatient setting pending   # Hx of tobacco abuse  -Nicotine patch PRN   # Hx of alcohol and substance abuse  - UDS neg, alc <11  - CIWA score 3, no ativan given - Lipase 9, AST 46, ALT 27, Alk phos 122  FEN/GI: As above  Prophylaxis: SQ heparin   Disposition: telemetry  Subjective: Continues to tolerate food by mouth with no nausea or vomiting but states she still has not eaten much because she doesn't have an appetite. No complaints over night.  Objective: Temp:  [97.8 F (36.6 C)-98.6 F (37 C)] 97.9 F (36.6 C) (07/24 0614) Pulse Rate:  [76-90] 83 (07/24 0614) Resp:  [9-17] 16 (07/24 0614) BP: (127-159)/(85-101) 143/94 mmHg (07/24 0614) SpO2:  [96 %-100 %] 100 % (07/24 0614) Weight:  [117 lb 3.2 oz (53.162 kg)] 117 lb 3.2 oz (53.162 kg) (07/23 2245)  Physical Exam: General: laying in bed, comfortable, in no acute distress Cardiovascular: RRR, +3/6 SEM RUSB  Respiratory: clear to auscultation bilaterally, no wheezes  Abdomen: Soft, TTP epigastric, no HSM, NABS  Extremities: No edema, no rashes  Skin: No rashes noted  Neuro: No focal deficits, alert & oriented x3  Laboratory:  Recent Labs Lab 11/16/12  1252 11/16/12 2041 11/18/12 0805  WBC 9.7 8.3 4.3  HGB 13.2 11.3* 10.5*  HCT 39.0 33.1* 32.0*  PLT 209 157 127*    Recent Labs Lab 11/16/12 2041  11/17/12 1438 11/18/12 0805 11/18/12 1455  NA 142  < > 137 133* 135  K 3.1*  < > 3.2* 3.8 3.9  CL 105  < > 107 102 104  CO2 20  < > 22 20 23   BUN 13  < > 9 6 5*  CREATININE 0.98  < > 0.80 0.72 0.74  CALCIUM 8.2*  < > 8.4 8.6 8.7  PROT 6.0  --   --   --   --   BILITOT 0.6  --   --   --   --   ALKPHOS 122*  --   --   --   --   ALT 27  --   --   --   --   AST 46*  --   --   --   --   GLUCOSE 193*  < > 109* 271*  149*  < > = values in this interval not displayed.  Beverely Low, MD 11/19/2012, 7:13 AM PGY-1, [facility] Family Medicine FPTS Intern pager: [phone], text pages welcome

## 2012-11-20 DIAGNOSIS — K861 Other chronic pancreatitis: Secondary | ICD-10-CM | POA: Diagnosis present

## 2012-11-20 LAB — BASIC METABOLIC PANEL
BUN: 4 mg/dL — ABNORMAL LOW (ref 6–23)
Chloride: 108 mEq/L (ref 96–112)
Glucose, Bld: 121 mg/dL — ABNORMAL HIGH (ref 70–99)
Potassium: 3.1 mEq/L — ABNORMAL LOW (ref 3.5–5.1)

## 2012-11-20 LAB — GLUCOSE, CAPILLARY: Glucose-Capillary: 176 mg/dL — ABNORMAL HIGH (ref 70–99)

## 2012-11-20 MED ORDER — POTASSIUM CHLORIDE CRYS ER 20 MEQ PO TBCR
40.0000 meq | EXTENDED_RELEASE_TABLET | Freq: Two times a day (BID) | ORAL | Status: DC
Start: 2012-11-20 — End: 2012-11-20
  Administered 2012-11-20: 40 meq via ORAL
  Filled 2012-11-20: qty 2

## 2012-11-20 MED ORDER — LISINOPRIL 5 MG PO TABS: 5.0000 mg | ORAL_TABLET | Freq: Every day | ORAL | Status: DC

## 2012-11-20 MED ORDER — PANCRELIPASE (LIP-PROT-AMYL) 12000-38000 UNITS PO CPEP: 1.0000 | ORAL_CAPSULE | Freq: Three times a day (TID) | ORAL | Status: DC

## 2012-11-20 MED ORDER — METOCLOPRAMIDE HCL 10 MG PO TABS: 10.0000 mg | ORAL_TABLET | Freq: Two times a day (BID) | ORAL | Status: DC

## 2012-11-20 MED ORDER — PANCRELIPASE (LIP-PROT-AMYL) 12000-38000 UNITS PO CPEP
1.0000 | ORAL_CAPSULE | Freq: Three times a day (TID) | ORAL | Status: DC
  Administered 2012-11-20: 1 via ORAL
  Filled 2012-11-20 (×3): qty 1

## 2012-11-20 MED ORDER — INSULIN GLARGINE 100 UNIT/ML ~~LOC~~ SOLN: 10.0000 [IU] | Freq: Every day | SUBCUTANEOUS | Status: DC

## 2012-11-20 MED ORDER — ONDANSETRON HCL 4 MG PO TABS: 4.0000 mg | ORAL_TABLET | Freq: Three times a day (TID) | ORAL | Status: DC | PRN

## 2012-11-20 NOTE — Progress Notes (Signed)
Seen and examined.  Agree with Dr. Richarda Blade.  She is ready for DC.  I am frankly surprised by the diagnosis of chronic pancreatitis.  It is good to have this understanding of her nausea and weight loss.

## 2012-11-20 NOTE — Progress Notes (Signed)
Family Medicine Teaching Service Daily Progress Note Intern Pager: [phone]  Patient name: Christine [name] Medical record number: [id number] Date of birth: [birth year] Age: 41 y.o. Gender: female  Primary Care Provider: Felix Pacini, DO Consultants: none Code Status: full  Pt Overview and Major Events to Date:  7/21 - Admitted in DKA   Assessment and Plan: Christine [name] is a 42 y.o. year old female presenting with DKA . PMH is significant for DM I uncontrolled, HTN, substance abuse including alcohol abuse w/ daily alcohol use.   # AG Metabolic Acidosis secondary to mild DKA - Calculation of Delta Delta showing concomitant metabolic alkalosis most likely secondary to vomiting and Cl- loss  - started on glucostabilizer, off when gap closed and lantus given - BMET q 24 hrs  - Telemetry, Vitals per unit  - K+ 3.1 this am, replete prior to d/c - d/c MIVF - Zofran PRN for nausea/vomiting. - Gap closed since 7/23  # Nausea: w/ vomiting chronically, no clear etiology - Abd u/s in may showed liver lesion likely fatty infiltrate vs. Hemangioma as well as pancreatic calcifications  - MRI w/ chronic pancreatitis findings, atrophy, no liver lesion  # DM I, hard to control. As above. CBG: 104-153 yest; received 1 unit aspart over 24 hours - pt seen in pharmacy clinic on 6/12 due to hard to control CBG's. Was continued on 15 U of Lantus as that time with instructions to decrease Lantus if CBG fasting <100. At that time, pt had multiple CBG readings, random, under 200 and some as [name] as 50. Was instructed to obtain post-prandial readings periodically to assess need for mealtime coverage.  - Will discuss case with Dr. Raymondo Band for further management upon outpatient regimen to send home on.  - Last A1C 9.8 in March 2014. 10.1 this hospitalization - continue lantus 10 units  # HTN: chronic - Running 100s-130s now - started lisinopril 5mg  7/23  # Dyspepsia secondary to DKA vs alcohol w/d  -Most likely  secondary to constant vomiting -Continue Protonix   # Depression  -Recently started on Paxil, will continue   # Hx of GIB and dyspepsia  -Continue Protonix  -GI referral in outpatient setting pending   # Hx of tobacco abuse  -Nicotine patch PRN   # Hx of alcohol and substance abuse  - UDS neg, alc <11  - CIWA score 3, no ativan given - Lipase 9, AST 46, ALT 27, Alk phos 122  FEN/GI: As above  Prophylaxis: SQ heparin   Disposition: home today  Subjective: Reports taking good po and ready to go home  Objective: Temp:  [97.9 F (36.6 C)-98.9 F (37.2 C)] 98.3 F (36.8 C) (07/25 0606) Pulse Rate:  [72-98] 73 (07/25 0606) Resp:  [16] 16 (07/25 0606) BP: (106-147)/(68-86) 106/68 mmHg (07/25 0606) SpO2:  [99 %-100 %] 100 % (07/25 0606) Weight:  [117 lb 15.1 oz (53.5 kg)] 117 lb 15.1 oz (53.5 kg) (07/24 2135)  Physical Exam: General: sitting on side of bed, comfortable, in no acute distress Cardiovascular: RRR, +3/6 SEM RUSB  Respiratory: clear to auscultation bilaterally, no wheezes  Abdomen: Soft, TTP epigastric, no HSM, NABS  Extremities: No edema, no rashes  Skin: No rashes noted  Neuro: No focal deficits, alert & oriented x3  Laboratory:  Recent Labs Lab 11/16/12 1252 11/16/12 2041 11/18/12 0805  WBC 9.7 8.3 4.3  HGB 13.2 11.3* 10.5*  HCT 39.0 33.1* 32.0*  PLT 209 157 127*    Recent Labs  Lab 11/16/12 2041  11/18/12 1455 11/19/12 0737 11/20/12 0440  NA 142  < > 135 136 137  K 3.1*  < > 3.9 3.6 3.1*  CL 105  < > 104 105 108  CO2 20  < > 23 24 25   BUN 13  < > 5* 4* 4*  CREATININE 0.98  < > 0.74 0.74 0.75  CALCIUM 8.2*  < > 8.7 8.4 7.9*  PROT 6.0  --   --   --   --   BILITOT 0.6  --   --   --   --   ALKPHOS 122*  --   --   --   --   ALT 27  --   --   --   --   AST 46*  --   --   --   --   GLUCOSE 193*  < > 149* 153* 121*  < > = values in this interval not displayed.  MRI abd: No old focal liver lesion. No correlate for the ultrasound   abnormality. Normal spleen. Grossly normal stomach. Normal gallbladder,  biliary tract. Findings of chronic pancreatitis. Pancreatic atrophy with main  duct and side branch ectasia throughout. Main duct measures 6 mm  on image 14/314. No evidence of obstructive mass. No acute  pancreatitis. Normal adrenal glands and kidneys. Trace perihepatic  ascites within Morison's pouch. No abdominal adenopathy.   Christine Low, MD 11/20/2012, 9:47 AM PGY-1, [facility] Family Medicine FPTS Intern pager: [phone], text pages welcome

## 2012-11-20 NOTE — Discharge Summary (Signed)
Family Medicine Teaching Christine [name] Discharge Summary  Patient name: Christine [name] Medical record number: [id number] Date of birth: July 02, 1970 Age: 42 y.o. Gender: female Date of Admission: 11/16/2012  Date of Discharge: 11/20/2012  Admitting Physician: Sanjuana Letters, MD  Primary Care Provider: Felix Pacini, DO Consultants: none  Indication for Hospitalization: DKA, nausea/vomiting  Discharge Diagnoses/Problem List:  Patient Active Problem List   Diagnosis Date Noted  . Chronic pancreatitis 11/20/2012  . Protein-calorie malnutrition, severe 11/18/2012  . DKA (diabetic ketoacidoses) 11/16/2012  . Depression 09/29/2012  . Chronic diarrhea 09/03/2012  . Abdominal pain, unspecified site 07/18/2012  . Odynophagia 07/18/2012  . Neuropathic pain 01/29/2011  . DRUG ABUSE, HX OF 07/05/2009  . ANEMIA 09/28/2008  . HYPERLIPIDEMIA 09/12/2008  . TOBACCO USER 09/12/2008  . DIABETES MELLITUS, I 06/26/2006     Disposition: home  Discharge Condition: stable  Brief Hospital Course: Christine [name] is a 42 y.o. year old female presenting with DKA . PMH is significant for DM I uncontrolled, HTN, substance abuse including alcohol abuse w/ daily alcohol use.   # AG Metabolic Acidosis secondary to mild DKA - Calculation of Delta Delta showing concomitant metabolic alkalosis most likely secondary to vomiting and Cl- loss  - started on glucostabilizer, off when gap closed and lantus given  - BMET q 24 hrs  - Telemetry, Vitals per unit  - K+ 3.1 this am, replete prior to d/c  - d/c MIVF  - Zofran PRN for nausea/vomiting  - Gap closed since 7/23   # Nausea: w/ vomiting chronically, no clear etiology  - Abd u/s in may showed liver lesion likely fatty infiltrate vs. Hemangioma as well as pancreatic calcifications  - MRI w/ chronic pancreatitis findings, atrophy, no liver lesion  - started pancrease on 7/25  # DM I, hard to control. As above. CBG: 104-153 yest; received 1 unit aspart  over 24 hours  - pt seen in pharmacy clinic on 6/12 due to hard to control CBG's. Was continued on 15 U of Lantus as that time with instructions to decrease Lantus if CBG fasting <100. At that time, pt had multiple CBG readings, random, under 200 and some as low as 50. Was instructed to obtain post-prandial readings periodically to assess need for mealtime coverage.  - Will discuss case with Dr. Raymondo Band for further management upon outpatient regimen to send home on.  - Last A1C 9.8 in March 2014. 10.1 this hospitalization  - continue lantus 10 units   # HTN: chronic  - Running 100s-130s now  - started lisinopril 5mg  7/23   # Dyspepsia secondary to DKA vs alcohol w/d  -Most likely secondary to constant vomiting vs pancreatitis pain, see above -Continue Protonix   # Depression  -Recently started on Paxil, will continue   # Hx of GIB and dyspepsia  -Continue Protonix  -GI referral in outpatient setting pending   # Hx of tobacco abuse  -Nicotine patch PRN   # Hx of alcohol and substance abuse  - UDS neg, alc <11  - CIWA score 3, no ativan given  - Lipase 9, AST 46, ALT 27, Alk phos 122   Issues for Follow Up:  # Chronic pancreatitis w/pancreatic insufficiency. We started creon on d/c, follow up with how that's going. Further nausea, how her po has been. She need referral to GI to follow up these MRI findings and determine whether further work-up is needed.  # Hypertension: she came in on no antihypertensives but was hypertensive  here and we saw that she had previously been on lisinopril so we started this. Follow up BP and increase dose if needed/tolerated.  # Diabetes: She was well controlled with 10u lantus here but this may need to be increased as po improves and absorption hopefully improves with pancreatic enzymes.  Significant Procedures: none  Significant Labs and Imaging:   Recent Labs Lab 11/16/12 1252 11/16/12 2041 11/18/12 0805  WBC 9.7 8.3 4.3  HGB 13.2 11.3*  10.5*  HCT 39.0 33.1* 32.0*  PLT 209 157 127*    Recent Labs Lab 11/16/12 2041  11/17/12 1438 11/18/12 0805 11/18/12 1455 11/19/12 0737 11/20/12 0440  NA 142  < > 137 133* 135 136 137  K 3.1*  < > 3.2* 3.8 3.9 3.6 3.1*  CL 105  < > 107 102 104 105 108  CO2 20  < > 22 20 23 24 25   GLUCOSE 193*  < > 109* 271* 149* 153* 121*  BUN 13  < > 9 6 5* 4* 4*  CREATININE 0.98  < > 0.80 0.72 0.74 0.74 0.75  CALCIUM 8.2*  < > 8.4 8.6 8.7 8.4 7.9*  ALKPHOS 122*  --   --   --   --   --   --   AST 46*  --   --   --   --   --   --   ALT 27  --   --   --   --   --   --   ALBUMIN 3.0*  --   --   --   --   --   --   < > = values in this interval not displayed.  Lipase 9  Outstanding Results: none  Discharge Medications:    Medication List         glucose blood test strip  Commonly known as:  ONE TOUCH ULTRA TEST  Use as instructed.   Qdispense - sufficient for 1 time daily testing.     insulin glargine 100 UNIT/ML injection  Commonly known as:  LANTUS  Inject 0.1 mLs (10 Units total) into the skin daily. Increase 1 unit each AM until fasting CBG is 100     Insulin Syringe-Needle U-100 30G 0.5 ML Misc  1 Container by Does not apply route once. Dispense QS for 1 month supply of once daily injection     lipase/protease/amylase 16109 UNITS Cpep  Commonly known as:  CREON-12/PANCREASE  Take 1 capsule by mouth 3 (three) times daily with meals.     lisinopril 5 MG tablet  Commonly known as:  PRINIVIL,ZESTRIL  Take 1 tablet (5 mg total) by mouth daily.     metoCLOPramide 10 MG tablet  Commonly known as:  REGLAN  Take 1 tablet (10 mg total) by mouth 2 (two) times daily.     ondansetron 4 MG tablet  Commonly known as:  ZOFRAN  Take 1 tablet (4 mg total) by mouth every 8 (eight) hours as needed for nausea.     pantoprazole 40 MG tablet  Commonly known as:  PROTONIX  Take 1 tablet (40 mg total) by mouth daily.     PARoxetine 20 MG tablet  Commonly known as:  PAXIL  Take 1 tablet  (20 mg total) by mouth every morning.        Discharge Instructions: Please refer to Patient Instructions section of EMR for full details.  Patient was counseled important signs and symptoms that should prompt return to  medical care, changes in medications, dietary instructions, activity restrictions, and follow up appointments.   Follow-Up Appointments: Follow-up Information   Follow up with Felix Pacini, DO On 12/02/2012. (2:30pm)    Contact information:   [address] [place] [address] [phone]       Beverely Low, MD 11/20/2012, 10:53 PM PGY-1, [facility] Family Medicine

## 2012-11-20 NOTE — Progress Notes (Signed)
Pt. Got d/c orders.medications were call by the MD. To [facility] pharmacy.tele was d/c.IV was d/c.pt. Ready to go home.

## 2012-11-21 NOTE — Discharge Summary (Signed)
Seen and examined on the day of discharge.  Agree with DC documentation and management by Dr. Richarda Blade.

## 2012-11-23 ENCOUNTER — Telehealth: Payer: Self-pay | Admitting: Family Medicine

## 2012-11-23 NOTE — Telephone Encounter (Signed)
Pt received some prescriptions from her ER visit and one of them is over 200.00. She wanted to know if we had any assistance available to help for it. I told her to call [facility] pharmacy to see if it is cheaper. JW

## 2012-11-24 ENCOUNTER — Telehealth: Payer: Self-pay | Admitting: Family Medicine

## 2012-11-24 NOTE — Telephone Encounter (Signed)
Pt is asking that Dr. Raymondo Band call her about her medication and diabetes. JW

## 2012-11-30 NOTE — Telephone Encounter (Signed)
She is smoking only 3 cigs per day.  Has majority of blood sugar readings < 200 with  a low reading of 79.   She reports taking lantus 10 units daily.   Follow up with Dr. Claiborne Billings on Wed.     Determine next steps (Rx clinic visit) at that time.

## 2012-12-02 ENCOUNTER — Inpatient Hospital Stay: Payer: BC Managed Care – PPO | Admitting: Family Medicine

## 2012-12-11 ENCOUNTER — Ambulatory Visit (INDEPENDENT_AMBULATORY_CARE_PROVIDER_SITE_OTHER): Payer: BC Managed Care – PPO | Admitting: *Deleted

## 2012-12-11 DIAGNOSIS — Z111 Encounter for screening for respiratory tuberculosis: Secondary | ICD-10-CM

## 2012-12-11 NOTE — Progress Notes (Signed)
PPD Placement note Christine [name], 42 y.o. female is here today for placement of PPD test Reason for PPD test: work  Pt taken PPD test before: yes Verified in allergy area and with patient that they are not allergic to the products PPD is made of (Phenol or Tween). Yes Is patient taking any oral or IV steroid medication now or have they taken it in the last month? no Has the patient ever received the BCG vaccine?: no Has the patient been in recent contact with anyone known or suspected of having active TB disease?: no   O: Alert and oriented in NAD. P:  PPD placed on 12/11/2012.  Patient advised to return for reading within 48-72 hours. Wyatt Haste, RN-BSN

## 2012-12-14 ENCOUNTER — Ambulatory Visit: Payer: BC Managed Care – PPO | Admitting: *Deleted

## 2012-12-14 ENCOUNTER — Encounter: Payer: Self-pay | Admitting: *Deleted

## 2012-12-14 DIAGNOSIS — Z111 Encounter for screening for respiratory tuberculosis: Secondary | ICD-10-CM

## 2012-12-14 LAB — TB SKIN TEST
Induration: 0 mm
TB Skin Test: NEGATIVE

## 2012-12-14 NOTE — Progress Notes (Signed)
PPD Reading Note PPD read and results entered in EpicCare. Result: 0 mm induration. Interpretation: negative If test not read within 48-72 hours of initial placement, patient advised to repeat in other arm 1-3 weeks after this test. Wyatt Haste, RN-BSN

## 2012-12-17 ENCOUNTER — Encounter: Payer: Self-pay | Admitting: Family Medicine

## 2012-12-23 ENCOUNTER — Inpatient Hospital Stay: Payer: BC Managed Care – PPO | Admitting: Family Medicine

## 2012-12-25 ENCOUNTER — Ambulatory Visit (INDEPENDENT_AMBULATORY_CARE_PROVIDER_SITE_OTHER): Payer: BC Managed Care – PPO | Admitting: Family Medicine

## 2012-12-25 ENCOUNTER — Encounter: Payer: Self-pay | Admitting: Family Medicine

## 2012-12-25 VITALS — BP 129/76 | HR 75 | Temp 98.1°F | Ht 69.0 in | Wt 118.0 lb

## 2012-12-25 DIAGNOSIS — R112 Nausea with vomiting, unspecified: Secondary | ICD-10-CM

## 2012-12-25 DIAGNOSIS — I10 Essential (primary) hypertension: Secondary | ICD-10-CM

## 2012-12-25 DIAGNOSIS — K861 Other chronic pancreatitis: Secondary | ICD-10-CM

## 2012-12-25 DIAGNOSIS — E109 Type 1 diabetes mellitus without complications: Secondary | ICD-10-CM

## 2012-12-25 DIAGNOSIS — R109 Unspecified abdominal pain: Secondary | ICD-10-CM

## 2012-12-25 LAB — CBC
Platelets: 192 10*3/uL (ref 150–400)
RBC: 3.7 MIL/uL — ABNORMAL LOW (ref 3.87–5.11)
WBC: 6.2 10*3/uL (ref 4.0–10.5)

## 2012-12-25 LAB — COMPREHENSIVE METABOLIC PANEL
ALT: 62 U/L — ABNORMAL HIGH (ref 0–35)
AST: 109 U/L — ABNORMAL HIGH (ref 0–37)
Albumin: 3.2 g/dL — ABNORMAL LOW (ref 3.5–5.2)
Calcium: 8.6 mg/dL (ref 8.4–10.5)
Chloride: 97 mEq/L (ref 96–112)
Potassium: 3.7 mEq/L (ref 3.5–5.3)

## 2012-12-25 LAB — LIPASE: Lipase: 15 U/L (ref 0–75)

## 2012-12-25 MED ORDER — ONDANSETRON 4 MG PO TBDP: 4.0000 mg | ORAL_TABLET | Freq: Three times a day (TID) | ORAL | Status: DC | PRN

## 2012-12-25 MED ORDER — TRAMADOL HCL 50 MG PO TABS: 50.0000 mg | ORAL_TABLET | Freq: Four times a day (QID) | ORAL | Status: DC | PRN

## 2012-12-25 NOTE — Assessment & Plan Note (Signed)
Re-started on Lisinopril while in the Hospital. Reports compliance. Today BP is controlled. No side effects noted.  P/ Continue Lisinopril at same dose.

## 2012-12-25 NOTE — Assessment & Plan Note (Addendum)
Will obtain Lipase today since pt has new onset of symptoms. She is able to tolerate PO and on Physical Exam she appears well hydrated and there is no signs of acute abdomen at this time. Normal vitals (hemodynamically stable) Discussed signs of worsening condition that should prompt re-evaluation. GI referral placed today. F/u to discuss results

## 2012-12-25 NOTE — Progress Notes (Signed)
Family Medicine Office Visit Note   Subjective:   Patient ID: Christine [name], female  DOB: 02-11-1971, 42 y.o.. MRN: [id number]   Pt that comes today for hospital f/u. Her hospitalization was in 11/16/2012 with DKA.  Her issues to follow up are: #1. Chronic Pancreatitis vs Pancreatic Insufficiency  have not been able to take Pancrease prescribed due to cost. She also has not come back sooner for her f/u and GI referral has not been placed until today. Last alcoholic drink was reported to be one week ago. She report has been ok until this am when she had nausea and 1 vomit after eating a bisque. She also reports mild to moderated upper abdominal pain. No radiation. No diarrheas or constipation. No fever or chills. No urinary symptoms either.    #2. DM type I:  left hospital on 10 units on Lantus. Now on 12u and reports CBG's in the 100's but some morning in the 150's  #3. HTN: taking Lisinopril and BP has been controlled per pt's report. Denies noticeable side effects of medication.   Review of Systems:  Per HPI.  Objective:   Physical Exam: Gen:  NAD HEENT: Moist mucous membranes  CV: Regular rate and rhythm, no murmurs rubs or gallops PULM: Clear to auscultation bilaterally. No wheezes/rales/rhonchi ABD: Soft, non distended,  mildly tender on epigastrium, no guarding and no rebound. Normal bowel sounds EXT: No edema Neuro: Alert and oriented x3. No focalization  Assessment & Plan:

## 2012-12-25 NOTE — Patient Instructions (Addendum)
Abdominal pain, nausea and vomiting. Your symptoms do not seem very severe right now. You can take Zofran 4mg  every 8h as needed for nausea and Tramadol for moderated pain. We have also ordered some labs that will discuss results at your next appointment.  If you develop: - intense abdominal pain. - intractable nausea and vomiting - fever or other symptoms, PLEASE GET EVALUATED RIGHT AWAY

## 2012-12-25 NOTE — Assessment & Plan Note (Signed)
Controlled on 12 units of Lantus per Pt's report. P/ Will get CMET today as part of her work up for current nausea, 1 vomit and abdominal pain. No due for another A1C yet.

## 2013-01-05 ENCOUNTER — Encounter: Payer: Self-pay | Admitting: Internal Medicine

## 2013-01-08 ENCOUNTER — Encounter: Payer: Self-pay | Admitting: *Deleted

## 2013-01-20 ENCOUNTER — Ambulatory Visit (INDEPENDENT_AMBULATORY_CARE_PROVIDER_SITE_OTHER): Payer: BC Managed Care – PPO | Admitting: Internal Medicine

## 2013-01-20 ENCOUNTER — Encounter: Payer: Self-pay | Admitting: Internal Medicine

## 2013-01-20 VITALS — BP 120/80 | HR 72 | Ht 69.0 in | Wt 128.4 lb

## 2013-01-20 DIAGNOSIS — E43 Unspecified severe protein-calorie malnutrition: Secondary | ICD-10-CM

## 2013-01-20 DIAGNOSIS — K861 Other chronic pancreatitis: Secondary | ICD-10-CM

## 2013-01-20 DIAGNOSIS — K860 Alcohol-induced chronic pancreatitis: Secondary | ICD-10-CM

## 2013-01-20 MED ORDER — PANCRELIPASE (LIP-PROT-AMYL) 36000-114000 UNITS PO CPEP: 1.0000 | ORAL_CAPSULE | ORAL | Status: DC

## 2013-01-20 MED ORDER — PANCRELIPASE (LIP-PROT-AMYL) 36000-114000 UNITS PO CPEP: 1.0000 | ORAL_CAPSULE | Freq: Every day | ORAL | Status: DC

## 2013-01-20 NOTE — Assessment & Plan Note (Signed)
Start pancreatic supplements and remain off EtOH

## 2013-01-20 NOTE — Progress Notes (Signed)
  Subjective:    Patient ID: Christine [name], female    DOB: 1971/04/18, 42 y.o.   MRN: [id number] Referred by: Hillis Range, Dayar*  HPI The patient is a very nice single [place] American woman with a long history of alcohol use and clinical scenario and imaging characteristics consistent with chronic pancreatitis. More recently she's had an MRI of the abdomen which demonstrated chronic pancreatitis. She is having problems with loose greasy oily stools and weight loss. She has a lot of borborygmi and mild abdominal cramps. Her problems are recognized and she was prescribed pancreatic enzyme supplement but apparently was too costly. She began drinking alcohol teenager. She has never had problems of the wall she says. She has stopped drinking recently. 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. Wt Readings from Last 3 Encounters:  01/20/13 128 lb 6 oz (58.231 kg)  12/25/12 118 lb (53.524 kg)  11/19/12 117 lb 15.1 oz (53.5 kg)   Review of Systems Positive for back pain, myalgias and night sweats, polyuria and polydipsia Eagle edema and depressed mood. She also has fatigue and dyspnea. All other review of systems negative or as per history of present illness    Objective:   Physical Exam General:  Thin, malnourished and in no acute distress Eyes:  anicteric. ENT:   Mouth and posterior pharynx free of mucosal lesions - ++ dental caries Neck:   supple w/o thyromegaly or mass.  Lungs: Clear to auscultation bilaterally. Heart:  S1S2, no rubs, murmurs, gallops. Abdomen:  soft, non-tender, no hepatosplenomegaly, hernia, or mass and BS+. - loose skin Lymph:  no cervical or supraclavicular adenopathy. Extremities:   no edema Skin   no rash. Neuro:  A&O x 3.  Psych:  appropriate mood and  Affect.   Data Reviewed:  MRI 11/19/2012  Findings of chronic pancreatitis. Pancreatic atrophy with main  duct and side branch ectasia throughout. Main duct measures 6 mm  on image  14/314     Assessment & Plan:  Chronic alcoholic pancreatitis  Protein-calorie malnutrition, severe

## 2013-01-20 NOTE — Assessment & Plan Note (Signed)
She is off alcohol and reminded to stay off. She definitely needs enzyme replacement. Will sample Creon 36K with meals and snacks and try to get Rx - she said Creon 12K was $200 - should be on formulary? Low fat diet REV 6 weeks Handouts on low fat diet and pancreatitis provided and I reviewed nature of her problem.

## 2013-01-20 NOTE — Patient Instructions (Addendum)
We have sent the following medications to your pharmacy for you to pick up at your convenience: Creon  Today we are also giving you samples of Creon 36,000 to take.  Take one with meals and one with each snack.  Continue to stay off alcohol.   I appreciate the opportunity to care for you.

## 2013-01-21 ENCOUNTER — Encounter: Payer: Self-pay | Admitting: Internal Medicine

## 2013-02-04 ENCOUNTER — Telehealth: Payer: Self-pay | Admitting: Family Medicine

## 2013-02-04 NOTE — Telephone Encounter (Signed)
Spoke with patient and made an appointment with Dr. Aviva Signs on Friday 10/10 @ 1:45pm. Patient legs are swelling worse and hurt a lot more.Erma Pinto

## 2013-02-04 NOTE — Telephone Encounter (Signed)
Pt called because she said her feet are swollen and wasn't sure if she should come in. She made an appointment on 10/21 for a physical. JW

## 2013-02-05 ENCOUNTER — Ambulatory Visit (INDEPENDENT_AMBULATORY_CARE_PROVIDER_SITE_OTHER): Payer: BC Managed Care – PPO | Admitting: Family Medicine

## 2013-02-05 ENCOUNTER — Encounter: Payer: Self-pay | Admitting: Family Medicine

## 2013-02-05 VITALS — BP 127/77 | HR 83 | Temp 97.9°F | Ht 69.0 in | Wt 146.4 lb

## 2013-02-05 DIAGNOSIS — I872 Venous insufficiency (chronic) (peripheral): Secondary | ICD-10-CM

## 2013-02-05 DIAGNOSIS — N912 Amenorrhea, unspecified: Secondary | ICD-10-CM

## 2013-02-05 LAB — POCT URINE PREGNANCY: Preg Test, Ur: NEGATIVE

## 2013-02-05 NOTE — Patient Instructions (Signed)
Venous Stasis and Chronic Venous Insufficiency  As people age, the veins located in their legs may weaken and stretch. When veins weaken and lose the ability to pump blood effectively, the condition is called chronic venous insufficiency (CVI) or venous stasis. Almost all veins return blood back to the heart. This happens by:   The force of the heart pumping fresh blood pushes blood back to the heart.   Blood flowing to the heart from the force of gravity.  In the deep veins of the legs, blood has to fight gravity and flow upstream back to the heart. Here, the leg muscles contract to pump blood back toward the heart. Vein walls are elastic, and many veins have small valves that only allow blood to flow in one direction. When leg muscles contract, they push inward against the elastic vein walls. This squeezes blood upward, opens the valves, and moves blood toward the heart. When leg muscles relax, the vein wall also relaxes and the valves inside the vein close to prevent blood from flowing backward. This method of pumping blood out of the legs is called the venous pump.  CAUSES   The venous pump works best while walking and leg muscles are contracting. But when a person sits or stands, blood pressure in leg veins can build. Deep veins are usually able to withstand short periods of inactivity, but long periods of inactivity (and increased pressure) can stretch, weaken, and damage vein walls. High blood pressure can also stretch and damage vein walls. The veins may no longer be able to pump blood back to the heart. Venous hypertension (high blood pressure inside veins) that lasts over time is a primary cause of CVI. CVI can also be caused by:    Deep vein thrombosis, a condition where a thrombus (blood clot) blocks blood flow in a vein.   Phlebitis, an inflammation of a superficial vein that causes a blood clot to form.  Other risk factors for CVI may include:    Heredity.   Obesity.   Pregnancy.    Sedentary lifestyle.   Smoking.   Jobs requiring long periods of standing or sitting in one place.   Age and gender:   Women in their 40's and 50's and men in their 70's are more prone to developing CVI.  SYMPTOMS   Symptoms of CVI may include:    Varicose veins.   Ulceration or skin breakdown.   Lipodermatosclerosis, a condition that affects the skin just above the ankle, usually on the inside surface. Over time the skin becomes brown, smooth, tight and often painful. Those with this condition have a high risk of developing skin ulcers.   Reddened or discolored skin on the leg.   Swelling.  DIAGNOSIS   Your caregiver can diagnose CVI after performing a careful medical history and physical examination. To confirm the diagnosis, the following tests may also be ordered:    Duplex ultrasound.   Plethysmography (tests blood flow).   Venograms (x-ray using a special dye).  TREATMENT  The goals of treatment for CVI are to restore a person to an active life and to minimize pain or disability. Typically, CVI does not pose a serious threat to life or limb, and with proper treatment most people with this condition can continue to lead active lives. In most cases, mild CVI can be treated on an outpatient basis with simple procedures. Treatment methods include:    Elastic compression socks.   Sclerotherapy, a procedure involving an injection of   a material that "dissolves" the damaged veins. Other veins in the network of blood vessels take over the function of the damaged veins.   Vein stripping (an older procedure less commonly used).   Laser Ablation surgery.   Valve repair.  HOME CARE INSTRUCTIONS    Elastic compression socks must be worn every day. They can help with symptoms and lower the chances of the problem getting worse, but they do not cure the problem.   Only take over-the-counter or prescription medicines for pain, discomfort, or fever as directed by your caregiver.    Your caregiver will review your other medications with you.  SEEK MEDICAL CARE IF:    You are confused about how to take your medications.   There is redness, swelling, or increasing pain in the affected area.   There is a red streak or line that extends up or down from the affected area.   There is a breakdown or loss of skin in the affected area, even if the breakdown is small.   You develop an unexplained oral temperature above 102 F (38.9 C).   There is an injury to the affected area.  SEEK IMMEDIATE MEDICAL CARE IF:    There is an injury and open wound to the affected area.   Pain is not adequately relieved with pain medication prescribed or becomes severe.   An oral temperature above 102 F (38.9 C) develops.   The foot/ankle below the affected area becomes suddenly numb or the area feels weak and hard to move.  MAKE SURE YOU:    Understand these instructions.   Will watch your condition.   Will get help right away if you are not doing well or get worse.  Document Released: 08/19/2006 Document Revised: 07/08/2011 Document Reviewed: 10/27/2006  [employer] Patient Information 2014 [employer].

## 2013-02-05 NOTE — Assessment & Plan Note (Signed)
Normal pulses. No erythema. Localized bilateral edema corresponding clinically with this dx. Plan/ Foot elevation Decrease salt from diet Compression stockings 15-82mmHg. Prescription given.  F/u as needed Discussed signs of worsening condition that should prompt re-evaluation.

## 2013-02-05 NOTE — Progress Notes (Signed)
Family Medicine Office Visit Note   Subjective:   Patient ID: Christine [name], female  DOB: 1970/10/01, 42 y.o.. MRN: [id number]   Pt that comes today for same day appointment complaining about leg swelling. Pt reports swelling on the legs started this past Saturday. She has been up during the day and at night time sits on chair with legs down up to 2:00am watching TV. Denies SOB, chest pain or other symptoms.   Pt also report missing her menses for about 21/2 months, she is sexually active and has tubal ligation for contraception. She has not noted any other symptoms but she takes nausea medication everyday for her gastroparesis.   Review of Systems:  Per HPI.  Objective:   Physical Exam: Gen:  NAD HEENT: Moist mucous membranes  CV: Regular rate and rhythm, no murmurs rubs or gallops PULM: Clear to auscultation bilaterally. No wheezes/rales/rhonchi ABD: Soft, non tender, non distended, normal bowel sounds EXT: 1+ non pitting edema on ankle and dorsal portion of foot bilaterally. Pedal and popliteal pulses bilaterally present and strong.  Neuro: Alert and oriented x3. No focalization  Assessment & Plan:

## 2013-02-06 ENCOUNTER — Other Ambulatory Visit: Payer: Self-pay | Admitting: Family Medicine

## 2013-02-12 ENCOUNTER — Other Ambulatory Visit: Payer: Self-pay | Admitting: Family Medicine

## 2013-02-12 MED ORDER — LISINOPRIL 5 MG PO TABS: 5.0000 mg | ORAL_TABLET | Freq: Every day | ORAL | Status: DC

## 2013-02-12 MED ORDER — METOCLOPRAMIDE HCL 10 MG PO TABS: 10.0000 mg | ORAL_TABLET | Freq: Two times a day (BID) | ORAL | Status: DC

## 2013-02-16 ENCOUNTER — Encounter: Payer: BC Managed Care – PPO | Admitting: Family Medicine

## 2013-02-16 ENCOUNTER — Telehealth: Payer: Self-pay | Admitting: Family Medicine

## 2013-03-03 ENCOUNTER — Encounter: Payer: BC Managed Care – PPO | Admitting: Family Medicine

## 2013-04-20 ENCOUNTER — Telehealth: Payer: Self-pay | Admitting: *Deleted

## 2013-04-20 DIAGNOSIS — E109 Type 1 diabetes mellitus without complications: Secondary | ICD-10-CM

## 2013-04-20 NOTE — Telephone Encounter (Signed)
LMOVM for pt to return call.  Please advise that she is due for a cholesterol screen, order placed.  Please schedule a fasting lab appt. Kervens Roper Jaiyla 

## 2013-05-05 ENCOUNTER — Ambulatory Visit (INDEPENDENT_AMBULATORY_CARE_PROVIDER_SITE_OTHER): Payer: BC Managed Care – PPO | Admitting: Family Medicine

## 2013-05-05 ENCOUNTER — Telehealth: Payer: Self-pay | Admitting: Family Medicine

## 2013-05-05 ENCOUNTER — Encounter: Payer: Self-pay | Admitting: Family Medicine

## 2013-05-05 VITALS — BP 140/79 | HR 98 | Temp 98.2°F | Ht 69.0 in | Wt 126.0 lb

## 2013-05-05 DIAGNOSIS — B86 Scabies: Secondary | ICD-10-CM | POA: Insufficient documentation

## 2013-05-05 MED ORDER — PERMETHRIN 5 % EX CREA: TOPICAL_CREAM | CUTANEOUS | Status: DC

## 2013-05-05 MED ORDER — IVERMECTIN 3 MG PO TABS: 200.0000 ug/kg | ORAL_TABLET | Freq: Once | ORAL | Status: DC

## 2013-05-05 NOTE — Assessment & Plan Note (Signed)
Rash and clinical picture consistent with scabies. Will treat with Permethrin.  Handout given. Advised follow up with PCP if fails to improve.

## 2013-05-05 NOTE — Progress Notes (Signed)
Subjective:     Patient ID: [name], female   DOB: [birth year], 43 y.o.   MRN: [id number]  HPI 43 year old female presents for a same day appointment regarding rash.  1) Rash - She reports diffuse rash that has been present for 3 weeks.   - Rash is located on the back, trunk, arms, and the interdigital web spaces - It is intensely pruritic.  - No new exposures.  No fevers, chills.  No reported drainage from skin lesions. - Of note, her signficant other is also having the same rash.   Review of Systems Per HPI    Objective:   Physical Exam Filed Vitals:   05/05/13 0939  BP: 140/79  Pulse: 98  Temp: 98.2 F (36.8 C)  Exam: General: well appearing female in NAD.  Skin: hyperpigmented, circular lesions (some with crusting) noted on the trunk, back, arms, and interdigital spaces.      Assessment/Plan:  See Problem List

## 2013-05-05 NOTE — Patient Instructions (Signed)
Please use the permethrin as prescribed.  You may repeat in ~ 1 week if needed.  Follow up with your PCP if you fail to improve.    Scabies Scabies are small bugs (mites) that burrow under the skin and cause red bumps and severe itching. These bugs can only be seen with a microscope. Scabies are highly contagious. They can spread easily from person to person by direct contact. They are also spread through sharing clothing or linens that have the scabies mites living in them. It is not unusual for an entire family to become infected through shared towels, clothing, or bedding.  HOME CARE INSTRUCTIONS   Your caregiver may prescribe a cream or lotion to kill the mites. If cream is prescribed, massage the cream into the entire body from the neck to the bottom of both feet. Also massage the cream into the scalp and face if your child is less than 41 year old. Avoid the eyes and mouth. Do not wash your hands after application.  Leave the cream on for 8 to 12 hours. Your child should bathe or shower after the 8 to 12 hour application period. Sometimes it is helpful to apply the cream to your child right before bedtime.  One treatment is usually effective and will eliminate approximately 95% of infestations. For severe cases, your caregiver may decide to repeat the treatment in 1 week. Everyone in your household should be treated with one application of the cream.  New rashes or burrows should not appear within 24 to 48 hours after successful treatment. However, the itching and rash may last for 2 to 4 weeks after successful treatment. Your caregiver may prescribe a medicine to help with the itching or to help the rash go away more quickly.  Scabies can live on clothing or linens for up to 3 days. All of your child's recently used clothing, towels, stuffed toys, and bed linens should be washed in hot water and then dried in a dryer for at least 20 minutes on high heat. Items that cannot be washed should be  enclosed in a plastic bag for at least 3 days.  To help relieve itching, bathe your child in a cool bath or apply cool washcloths to the affected areas.  Your child may return to school after treatment with the prescribed cream. SEEK MEDICAL CARE IF:   The itching persists longer than 4 weeks after treatment.  The rash spreads or becomes infected. Signs of infection include red blisters or yellow-tan crust. Document Released: 04/15/2005 Document Revised: 07/08/2011 Document Reviewed: 08/24/2008 [facility] Patient Information 2014 [place].

## 2013-05-05 NOTE — Telephone Encounter (Signed)
Rx sent for Ivermectin.

## 2013-05-05 NOTE — Telephone Encounter (Signed)
Pt called and would like Dr. Lacinda Axon to call in something that is cheaper then Permethrin. She can not afford to pay 89.00 for this. jw

## 2013-05-11 ENCOUNTER — Telehealth: Payer: Self-pay | Admitting: Family Medicine

## 2013-05-11 NOTE — Telephone Encounter (Signed)
Pt called and would like a refill of the medication that was given to her for scabies. She is still itching. Please call when this is done jw

## 2013-05-11 NOTE — Telephone Encounter (Signed)
How many treatments has she given herself? One treatment is usually effective and will eliminate approximately 95% of infestations. For severe cases, she can repeat the treatment in 1 week after first treatment. If she has treated herself twice (and her family) she needs to be advised  the itching and rash may last for 2 to 4 weeks after successful treatment.   I could prescribe her something for the itch, but I would like to know how effectively everyone in the house has been treated and how many treatments she has given herself. This medication has side effects if overused.   Please tell  Her  scabies can live on clothing or linens for up to 3 days. All of the families recently used clothing, towels, stuffed toys, and bed linens should be washed in hot water and then dried in a dryer for at least 20 minutes on high heat. Items that cannot be washed should be enclosed in a plastic bag for at least 3 days. Thanks.

## 2013-05-12 NOTE — Telephone Encounter (Signed)
Spoke with patient and she states that she has only had one treatment and would like to get 1 more treatment. I did explain below to her

## 2013-05-13 MED ORDER — PERMETHRIN 5 % EX CREA: TOPICAL_CREAM | CUTANEOUS | Status: DC

## 2013-05-13 NOTE — Telephone Encounter (Signed)
I have called a refill. This should be plenty to give her another treatment. It is not advised to perform more than two treatments. She should only give her self a second treatment if it has been a week since her last and she has followed all the steps outlined in prior telephone message. Thanks

## 2013-05-13 NOTE — Addendum Note (Signed)
Addended by: Howard Pouch A on: 05/13/2013 07:26 AM   Modules accepted: Orders

## 2013-05-13 NOTE — Telephone Encounter (Signed)
Spoke with patient and informed her of below 

## 2013-05-19 ENCOUNTER — Telehealth: Payer: Self-pay | Admitting: Family Medicine

## 2013-05-19 NOTE — Telephone Encounter (Signed)
Meds sent in for scabies is $89.99. Patient cannot afford. Please send in something cheaper.

## 2013-05-20 ENCOUNTER — Telehealth: Payer: Self-pay | Admitting: Pharmacist

## 2013-05-20 ENCOUNTER — Encounter: Payer: Self-pay | Admitting: *Deleted

## 2013-05-20 ENCOUNTER — Other Ambulatory Visit: Payer: Self-pay | Admitting: Family Medicine

## 2013-05-20 DIAGNOSIS — E109 Type 1 diabetes mellitus without complications: Secondary | ICD-10-CM

## 2013-05-20 MED ORDER — IVERMECTIN 3 MG PO TABS: 200.0000 ug/kg | ORAL_TABLET | Freq: Once | ORAL | Status: DC

## 2013-05-20 MED ORDER — INSULIN GLARGINE 100 UNIT/ML ~~LOC~~ SOLN: 10.0000 [IU] | Freq: Every day | SUBCUTANEOUS | Status: DC

## 2013-05-20 NOTE — Telephone Encounter (Signed)
Dr. Raoul Pitch has sent in pills and pt notified

## 2013-05-20 NOTE — Telephone Encounter (Signed)
Phone call follow up of message left 1/21 requesting call back.  Patient reports being out of insulin X 1 week.  She requests assistance with insulin as she is unable to afford copay for lantus at this time.  2 vials of lantus provided. for her to pick.   Patient agreed to follow up with me in [facility] in the next 2 weeks.   Patient will pick up her insulin today and resume previous dose lantus.

## 2013-05-20 NOTE — Telephone Encounter (Signed)
error 

## 2013-05-20 NOTE — Telephone Encounter (Signed)
I called the pharmacy and pt had not picked up the first prescription, although she told use she had given herself one treatment. She is now calling wanting a cheaper medication than permethrin for scabies treatment. The pharmacist stated that she has not met her deductible on her insurance and that is why the charge is ~$89. There is no other medication that is cheaper for her, I have asked her pharmacist. Please make her aware. Thanks.

## 2013-05-20 NOTE — Telephone Encounter (Signed)
Spoke with patient andnow she is stating that she wants the pills that were prescribed by Dr. Lacinda Axon on 1/7

## 2013-05-28 NOTE — Telephone Encounter (Signed)
error 

## 2013-06-23 ENCOUNTER — Telehealth: Payer: Self-pay | Admitting: Pharmacist

## 2013-06-23 NOTE — Telephone Encounter (Signed)
Patient would like to talk about disability.  I informed her she will need to schedule with Dr. Raoul Pitch.   I am happy to see her RE her CBGs which are significantly improved per her report.   She will call to reschedule with Dr. Raoul Pitch.

## 2013-06-25 ENCOUNTER — Telehealth: Payer: Self-pay | Admitting: Family Medicine

## 2013-06-25 ENCOUNTER — Telehealth: Payer: Self-pay | Admitting: Internal Medicine

## 2013-06-25 MED ORDER — PANCRELIPASE (LIP-PROT-AMYL) 36000-114000 UNITS PO CPEP: 1.0000 | ORAL_CAPSULE | ORAL | Status: DC

## 2013-06-25 NOTE — Telephone Encounter (Signed)
Relayed message,patient states that Dr Raoul Pitch is booked until mid March.I asked why she's needing disability paper and she stated because of diabetes,HTN and chronic pancreatitis.I advised her to schedule with Dr Raoul Pitch because she's her PCPand I would speak with Dr Raoul Pitch to see if we could schedule earlier appointment..patient voiced understanding. Sheylin Scharnhorst, Lewie Loron

## 2013-06-25 NOTE — Telephone Encounter (Signed)
Please call pt and apologize, but I have not seen her since June 2014. Reading through notes I do not see a reason for her to be out of work that was documented. That does not mean there is not one, but I can not attest to that by her documents and the fact I have not seen her. I would have to see her and discuss this. If it is GI related, she could try to ask her gastroenterologist. Since she has recently seen them. Thanks.

## 2013-06-25 NOTE — Telephone Encounter (Signed)
Spoke with patient and she said it was the creon she has been out of for 2 months, she moved and had lost our number.  I put samples up front for pick up as she requested.

## 2013-06-25 NOTE — Telephone Encounter (Signed)
Patient would like to speak to Dr. Raoul Pitch. She would not give much details but did say that she is needing a note stating that she cannot/ should not work for some hearing she has coming up on 07/01/13. Please call patient for more details. Wanted to make an appt but Dr. Raoul Pitch is completely booked until 07/08/13.

## 2013-06-25 NOTE — Telephone Encounter (Signed)
Spoke with patient and for her disability hearing  she is needing a letter stating to the [place] that Dr. Raoul Pitch agrees with the fact that patient has been out of work since July of 2014. The letter needs to states that in doctor's opinion that patient under medical care and that it is acceptable the fact that she has been out of work/ not to work.

## 2013-06-26 ENCOUNTER — Encounter (HOSPITAL_COMMUNITY): Payer: Self-pay | Admitting: Emergency Medicine

## 2013-06-26 ENCOUNTER — Emergency Department (HOSPITAL_COMMUNITY)
Admission: EM | Admit: 2013-06-26 | Discharge: 2013-06-26 | Disposition: A | Discharge status: Home / Self Care | Payer: BC Managed Care – PPO | Attending: Emergency Medicine | Admitting: Emergency Medicine

## 2013-06-26 DIAGNOSIS — Z3202 Encounter for pregnancy test, result negative: Secondary | ICD-10-CM | POA: Insufficient documentation

## 2013-06-26 DIAGNOSIS — K861 Other chronic pancreatitis: Secondary | ICD-10-CM

## 2013-06-26 DIAGNOSIS — E119 Type 2 diabetes mellitus without complications: Secondary | ICD-10-CM | POA: Insufficient documentation

## 2013-06-26 DIAGNOSIS — R1110 Vomiting, unspecified: Secondary | ICD-10-CM | POA: Insufficient documentation

## 2013-06-26 DIAGNOSIS — I10 Essential (primary) hypertension: Secondary | ICD-10-CM | POA: Insufficient documentation

## 2013-06-26 DIAGNOSIS — Z794 Long term (current) use of insulin: Secondary | ICD-10-CM | POA: Insufficient documentation

## 2013-06-26 DIAGNOSIS — F17200 Nicotine dependence, unspecified, uncomplicated: Secondary | ICD-10-CM | POA: Insufficient documentation

## 2013-06-26 DIAGNOSIS — R739 Hyperglycemia, unspecified: Secondary | ICD-10-CM

## 2013-06-26 LAB — URINALYSIS, ROUTINE W REFLEX MICROSCOPIC
BILIRUBIN URINE: NEGATIVE
Glucose, UA: >1000 mg/dL — AB
Hgb urine dipstick: NEGATIVE
KETONES UR: NEGATIVE mg/dL
Leukocytes, UA: NEGATIVE
NITRITE: NEGATIVE
Protein, ur: NEGATIVE mg/dL
Specific Gravity, Urine: 1.028 (ref 1.005–1.030)
Urobilinogen, UA: 0.2 mg/dL (ref 0.0–1.0)
pH: 5.5 (ref 5.0–8.0)

## 2013-06-26 LAB — COMPREHENSIVE METABOLIC PANEL
ALK PHOS: 118 U/L — AB (ref 39–117)
ALT: 36 U/L — ABNORMAL HIGH (ref 0–35)
AST: 50 U/L — ABNORMAL HIGH (ref 0–37)
Albumin: 2.8 g/dL — ABNORMAL LOW (ref 3.5–5.2)
BUN: 11 mg/dL (ref 6–23)
CO2: 18 mEq/L — ABNORMAL LOW (ref 19–32)
Calcium: 8.5 mg/dL (ref 8.4–10.5)
Chloride: 97 mEq/L (ref 96–112)
Creatinine, Ser: 1.08 mg/dL (ref 0.50–1.10)
GFR calc non Af Amer: 62 mL/min — ABNORMAL LOW (ref >90)
GFR, EST AFRICAN AMERICAN: 72 mL/min — AB (ref >90)
GLUCOSE: 377 mg/dL — AB (ref 70–99)
Potassium: 4.5 mEq/L (ref 3.7–5.3)
Sodium: 133 mEq/L — ABNORMAL LOW (ref 137–147)
TOTAL PROTEIN: 5.7 g/dL — AB (ref 6.0–8.3)
Total Bilirubin: <0.2 mg/dL — ABNORMAL LOW (ref 0.3–1.2)

## 2013-06-26 LAB — CBC WITH DIFFERENTIAL/PLATELET
BASOS ABS: 0 10*3/uL (ref 0.0–0.1)
BASOS PCT: 0 % (ref 0–1)
Eosinophils Absolute: 0.1 10*3/uL (ref 0.0–0.7)
Eosinophils Relative: 1 % (ref 0–5)
HCT: 30 % — ABNORMAL LOW (ref 36.0–46.0)
Hemoglobin: 10 g/dL — ABNORMAL LOW (ref 12.0–15.0)
Lymphocytes Relative: 3 % — ABNORMAL LOW (ref 12–46)
Lymphs Abs: 0.4 10*3/uL — ABNORMAL LOW (ref 0.7–4.0)
MCH: 30.2 pg (ref 26.0–34.0)
MCHC: 33.3 g/dL (ref 30.0–36.0)
MCV: 90.6 fL (ref 78.0–100.0)
Monocytes Absolute: 1 10*3/uL (ref 0.1–1.0)
Monocytes Relative: 8 % (ref 3–12)
NEUTROS PCT: 88 % — AB (ref 43–77)
Neutro Abs: 11.5 10*3/uL — ABNORMAL HIGH (ref 1.7–7.7)
PLATELETS: 184 10*3/uL (ref 150–400)
RBC: 3.31 MIL/uL — ABNORMAL LOW (ref 3.87–5.11)
RDW: 13.9 % (ref 11.5–15.5)
WBC: 13.1 10*3/uL — ABNORMAL HIGH (ref 4.0–10.5)

## 2013-06-26 LAB — URINE MICROSCOPIC-ADD ON

## 2013-06-26 LAB — CBG MONITORING, ED: GLUCOSE-CAPILLARY: 354 mg/dL — AB (ref 70–99)

## 2013-06-26 LAB — LIPASE, BLOOD: LIPASE: 10 U/L — AB (ref 11–59)

## 2013-06-26 LAB — BLOOD GAS, VENOUS
ACID-BASE DEFICIT: 1.4 mmol/L (ref 0.0–2.0)
Bicarbonate: 22.8 mEq/L (ref 20.0–24.0)
DRAWN BY: 295031
FIO2: 0.21 %
O2 Saturation: 69.2 %
Patient temperature: 98.6
TCO2: 21.5 mmol/L (ref 0–100)
pCO2, Ven: 38.7 mmHg — ABNORMAL LOW (ref 45.0–50.0)
pH, Ven: 7.388 — ABNORMAL HIGH (ref 7.250–7.300)
pO2, Ven: 37.3 mmHg (ref 30.0–45.0)

## 2013-06-26 LAB — POC URINE PREG, ED: Preg Test, Ur: NEGATIVE

## 2013-06-26 LAB — ETHANOL: ALCOHOL ETHYL (B): 59 mg/dL — AB (ref 0–11)

## 2013-06-26 MED ORDER — SODIUM CHLORIDE 0.9 % IV BOLUS (SEPSIS)
1000.0000 mL | Freq: Once | INTRAVENOUS | Status: AC
  Administered 2013-06-26: 1000 mL via INTRAVENOUS

## 2013-06-26 MED ORDER — ONDANSETRON HCL 4 MG/2ML IJ SOLN
4.0000 mg | Freq: Once | INTRAMUSCULAR | Status: AC
  Administered 2013-06-26: 4 mg via INTRAVENOUS
  Filled 2013-06-26: qty 2

## 2013-06-26 MED ORDER — MORPHINE SULFATE 4 MG/ML IJ SOLN
4.0000 mg | Freq: Once | INTRAMUSCULAR | Status: AC
  Administered 2013-06-26: 4 mg via INTRAVENOUS
  Filled 2013-06-26: qty 1

## 2013-06-26 MED ORDER — ONDANSETRON 4 MG PO TBDP: 4.0000 mg | ORAL_TABLET | Freq: Three times a day (TID) | ORAL | Status: DC | PRN

## 2013-06-26 MED ORDER — INSULIN ASPART 100 UNIT/ML ~~LOC~~ SOLN
10.0000 [IU] | Freq: Once | SUBCUTANEOUS | Status: AC
  Administered 2013-06-26: 10 [IU] via INTRAVENOUS
  Filled 2013-06-26: qty 1

## 2013-06-26 NOTE — ED Notes (Signed)
Patient states that she can't provide urine specimen Will make PA aware

## 2013-06-26 NOTE — ED Notes (Addendum)
Reviewed Discharge papers with patient and family. Family cursing at this RN.  [employer] technical officer.

## 2013-06-26 NOTE — ED Notes (Signed)
CBG--[id number] 

## 2013-06-26 NOTE — ED Provider Notes (Signed)
Medical screening examination/treatment/procedure(s) were performed by non-physician practitioner and as supervising physician I was immediately available for consultation/collaboration.   Delora Fuel, MD [phone]

## 2013-06-26 NOTE — ED Provider Notes (Signed)
CSN: [id number]     Arrival date & time 06/26/13  4098 History   First MD Initiated Contact with Patient 06/26/13 0340     Chief Complaint  Patient presents with  . Hyperglycemia   HPI  History provided by the patient and family. The patient is a 43 year old female with history of hypertension, diabetes, substance abuse, alcohol abuse and chronic pancreatitis who presents with symptoms of nausea, vomiting and diarrhea. Patient reports that she has had some issues of nausea and vomiting intermittently at first began on Monday. She did feel like her symptoms have improved slightly however yesterday evening she suddenly began having nausea and vomiting with mild abdominal pain and cramps followed by watery diarrhea. She does not know of any sick contacts. She has not had any recent travel. Family is also concerned for elevated blood sugar which was greater than 400 when EMS came. Patient has been using her Lantus. She also reports some subjective fevers and chills this evening. She has been eating and drinking normally otherwise. No other aggravating or alleviating factors. No other associated symptoms.   Past Medical History  Diagnosis Date  . Diabetes mellitus   . Hypertension   . Substance abuse   . Chronic pancreatitis   . Alcohol abuse    History reviewed. No pertinent past surgical history. History reviewed. No pertinent family history. History  Substance Use Topics  . Smoking status: Current Every Day Smoker -- 0.50 packs/day for 20 years    Types: Cigarettes  . Smokeless tobacco: Never Used     Comment: cutting down to   . Alcohol Use: Yes     Comment: stopped drinking beers a month ago   OB History   Grav Para Term Preterm Abortions TAB SAB Ect Mult Living                 Review of Systems  All other systems reviewed and are negative.    Allergies  Review of patient's allergies indicates no known allergies.  Home Medications   Current Outpatient Rx  Name  Route   Sig  Dispense  Refill  . Aspirin-Acetaminophen-Caffeine (GOODY HEADACHE PO)   Oral   Take 1 Package by mouth 2 (two) times daily as needed (pain).         . insulin glargine (LANTUS) 100 UNIT/ML injection   Subcutaneous   Inject 10 Units into the skin every morning.         . Pancrelipase, Lip-Prot-Amyl, 36000 UNITS CPEP   Oral   Take 1 capsule by mouth as directed.   100 capsule   0     Please take one capsule with each meal and one cap ...    BP 92/50  Pulse 87  Temp(Src) 98 F (36.7 C) (Oral)  Resp 16  SpO2 100% Physical Exam  Nursing note and vitals reviewed. Constitutional: She is oriented to person, place, and time. She appears well-developed and well-nourished. No distress.  HENT:  Head: Normocephalic.  Mucous membranes dry  Cardiovascular: Normal rate and regular rhythm.   Pulmonary/Chest: Effort normal and breath sounds normal. No respiratory distress. She has no wheezes. She has no rales.  Abdominal: Soft. There is tenderness in the epigastric area. There is no rebound, no guarding, no CVA tenderness, no tenderness at McBurney's point and negative Murphy's sign.  Pain is mild in the epigastric area  Neurological: She is alert and oriented to person, place, and time.  Skin: Skin is warm and dry. No  rash noted.  Psychiatric: She has a normal mood and affect. Her behavior is normal.    ED Course  Procedures   DIAGNOSTIC STUDIES: Oxygen Saturation is 100% on room air.    COORDINATION OF CARE:  Nursing notes reviewed. Vital signs reviewed. Initial pt interview and examination performed.   3:45 AM-patient seen and evaluated. She appears in mild discomforts no acute distress. Does not appear severely ill or toxicc. Does appear slightly dehydrated. Discussed work up plan with pt at bedside, which includes lab testing. Pt agrees with plan.  Pt feeling better after fluids and zofran.  Sugar still elevated.  Insulin ordered.  Anion gap of 18.  UA pending.  Will  order VBG.  Pt discussed in sign out with Glendell Docker.  She will follow pt.  Treatment plan initiated: Medications  sodium chloride 0.9 % bolus 1,000 mL (1,000 mLs Intravenous New Bag/Given 06/26/13 0355)  ondansetron (ZOFRAN) injection 4 mg (4 mg Intravenous Given 06/26/13 0356)   Results for orders placed during the hospital encounter of 06/26/13  ETHANOL      Result Value Ref Range   Alcohol, Ethyl (B) 59 (*) 0 - 11 mg/dL  COMPREHENSIVE METABOLIC PANEL      Result Value Ref Range   Sodium 133 (*) 137 - 147 mEq/L   Potassium 4.5  3.7 - 5.3 mEq/L   Chloride 97  96 - 112 mEq/L   CO2 18 (*) 19 - 32 mEq/L   Glucose, Bld 377 (*) 70 - 99 mg/dL   BUN 11  6 - 23 mg/dL   Creatinine, Ser 1.08  0.50 - 1.10 mg/dL   Calcium 8.5  8.4 - 10.5 mg/dL   Total Protein 5.7 (*) 6.0 - 8.3 g/dL   Albumin 2.8 (*) 3.5 - 5.2 g/dL   AST 50 (*) 0 - 37 U/L   ALT 36 (*) 0 - 35 U/L   Alkaline Phosphatase 118 (*) 39 - 117 U/L   Total Bilirubin <0.2 (*) 0.3 - 1.2 mg/dL   GFR calc non Af Amer 62 (*) >90 mL/min   GFR calc Af Amer 72 (*) >90 mL/min  LIPASE, BLOOD      Result Value Ref Range   Lipase 10 (*) 11 - 59 U/L  CBG MONITORING, ED      Result Value Ref Range   Glucose-Capillary 354 (*) 70 - 99 mg/dL   Comment 1 Notify RN     Anion gap of 18.   Imaging Review No results found.   MDM   Final diagnoses:  None        Martie Lee, PA-C 06/26/13 0559

## 2013-06-26 NOTE — ED Notes (Signed)
Notified RN, Raquel Sarna pt. CBG 354.

## 2013-06-26 NOTE — ED Provider Notes (Signed)
  Physical Exam  BP 82/39  Pulse 115  Temp(Src) 98.5 F (36.9 C) (Oral)  Resp 17  SpO2 97%  Physical Exam  ED Course  Procedures  MDM Pt not in dka. Pt is tolerating po here. Will send home with zofran      Glendell Docker, NP 06/26/13 [phone]

## 2013-06-26 NOTE — ED Notes (Signed)
Pt requesting information on diabetic diet, CN went to obtain printout from computer. Upon returning no one was in room.

## 2013-06-26 NOTE — ED Provider Notes (Signed)
Medical screening examination/treatment/procedure(s) were performed by non-physician practitioner and as supervising physician I was immediately available for consultation/collaboration.   Delora Fuel, MD [id number]/59 [id number]

## 2013-06-26 NOTE — ED Notes (Signed)
CBG 151. 

## 2013-06-26 NOTE — ED Notes (Signed)
Bed: QI29 Expected date:  Expected time:  Means of arrival:  Comments: EMS N/V, chills, febrile

## 2013-06-26 NOTE — Discharge Instructions (Signed)
Nausea and Vomiting °Nausea means you feel sick to your stomach. Throwing up (vomiting) is a reflex where stomach contents come out of your mouth. °HOME CARE  °· Take medicine as told by your doctor. °· Do not force yourself to eat. However, you do need to drink fluids. °· If you feel like eating, eat a normal diet as told by your doctor. °· Eat rice, wheat, potatoes, bread, lean meats, yogurt, fruits, and vegetables. °· Avoid high-fat foods. °· Drink enough fluids to keep your pee (urine) clear or pale yellow. °· Ask your doctor how to replace body fluid losses (rehydrate). Signs of body fluid loss (dehydration) include: °· Feeling very thirsty. °· Dry lips and mouth. °· Feeling dizzy. °· Dark pee. °· Peeing less than normal. °· Feeling confused. °· Fast breathing or heart rate. °GET HELP RIGHT AWAY IF:  °· You have blood in your throw up. °· You have black or bloody poop (stool). °· You have a bad headache or stiff neck. °· You feel confused. °· You have bad belly (abdominal) pain. °· You have chest pain or trouble breathing. °· You do not pee at least once every 8 hours. °· You have cold, clammy skin. °· You keep throwing up after 24 to 48 hours. °· You have a fever. °MAKE SURE YOU:  °· Understand these instructions. °· Will watch your condition. °· Will get help right away if you are not doing well or get worse. °Document Released: 10/02/2007 Document Revised: 07/08/2011 Document Reviewed: 09/14/2010 °Exit[employer]® Patient Information ©2014 [employer]. ° °

## 2013-06-26 NOTE — ED Notes (Signed)
EMS initially called out for hypoglycemia--family thought CBG was elevated Patient with N/V starting at 2100 Patient smells of ETOH CBG 400 per EMS 18 in left forearm placed by EMS--NS 300 ml given BP 100/60 HR 80s

## 2013-06-28 LAB — CBG MONITORING, ED
GLUCOSE-CAPILLARY: 151 mg/dL — AB (ref 70–99)
Glucose-Capillary: 336 mg/dL — ABNORMAL HIGH (ref 70–99)

## 2013-06-29 ENCOUNTER — Ambulatory Visit: Payer: BC Managed Care – PPO | Admitting: Pharmacist

## 2013-09-15 ENCOUNTER — Telehealth: Payer: Self-pay | Admitting: Family Medicine

## 2013-09-15 MED ORDER — ONETOUCH LANCETS MISC: Status: DC

## 2013-09-15 MED ORDER — METOCLOPRAMIDE HCL 10 MG PO TABS: 10.0000 mg | ORAL_TABLET | Freq: Two times a day (BID) | ORAL | Status: DC

## 2013-09-15 MED ORDER — GLUCOSE BLOOD VI STRP: ORAL_STRIP | Status: DC

## 2013-09-15 MED ORDER — INSULIN GLARGINE 100 UNIT/ML ~~LOC~~ SOLN: 10.0000 [IU] | Freq: Every morning | SUBCUTANEOUS | Status: DC

## 2013-09-15 NOTE — Telephone Encounter (Signed)
Patient just received her Medicaid and needs refills on the following meds. She has made an appt to see Dr. Raoul Pitch on 10/01/13.   Lantus Test strips & needles (OneTouch Ultra) Zofran Tramadol Metoclopramide Lisinopril Paroxetine Protonix  Please call patient once sent to pharmacy.

## 2013-09-15 NOTE — Telephone Encounter (Signed)
LVM with a female for patient to call back.Christine [name]

## 2013-09-15 NOTE — Telephone Encounter (Signed)
I will call in diabetic mediations, supplies and metoclopraminde. The other mediations we will need to evaluate for her at her appointment time. Thanks.

## 2013-09-17 ENCOUNTER — Telehealth: Payer: Self-pay | Admitting: Family Medicine

## 2013-09-17 ENCOUNTER — Telehealth: Payer: Self-pay | Admitting: *Deleted

## 2013-09-17 MED ORDER — ONETOUCH BASIC SYSTEM W/DEVICE KIT: PACK | Status: DC

## 2013-09-17 NOTE — Telephone Encounter (Signed)
Received fax from [facility] regarding pt's one touch ultra test strips and lancets.  Called [facility] to check if [employer] will cover Accu-check test strips and lancets.  It is covered by [employer] and medicaid.  Accu-check meter is cheaper under [employer]BS.  [facility] will need [facility] for Accu-check test strips, lancets and meter.  Medicaid does not cover one touch devices.  Derl Barrow, RN

## 2013-09-17 NOTE — Telephone Encounter (Signed)
Pt called because the ACCU touch meter is not covered by her insurance. She needs the One touch meter and all the supplies that go with this called in today. jw

## 2013-09-17 NOTE — Telephone Encounter (Signed)
One touch supplies were already called in for this patient on 09/15/2013 per her request. At that time she already had a meter. I will write for a one touch meter in addition. Thanks.

## 2013-09-18 MED ORDER — ACCU-CHEK FASTCLIX LANCETS MISC: Status: DC

## 2013-09-18 MED ORDER — GLUCOSE BLOOD VI STRP: ORAL_STRIP | Status: DC

## 2013-09-18 MED ORDER — ACCU-CHEK ADVANTAGE DIABETES KIT: PACK | Status: DC

## 2013-09-18 NOTE — Telephone Encounter (Signed)
I have called in accucheck meter and supplies. Of note, this is the opposite of what prior phone notes stated she needed/wanted. Please make patient aware and inform her to log fasting morning blood glucoses, as well as prior to each meal and bedtime. I will need to review this log (write in notebook), on her appointment. Thanks.

## 2013-10-01 ENCOUNTER — Ambulatory Visit: Payer: BC Managed Care – PPO | Admitting: Family Medicine

## 2013-10-11 ENCOUNTER — Ambulatory Visit: Payer: BC Managed Care – PPO | Admitting: Family Medicine

## 2013-10-28 ENCOUNTER — Ambulatory Visit (INDEPENDENT_AMBULATORY_CARE_PROVIDER_SITE_OTHER): Payer: BC Managed Care – PPO | Admitting: Family Medicine

## 2013-10-28 ENCOUNTER — Encounter: Payer: Self-pay | Admitting: Family Medicine

## 2013-10-28 VITALS — BP 148/87 | HR 86 | Temp 98.0°F | Ht 69.0 in | Wt 137.2 lb

## 2013-10-28 DIAGNOSIS — E785 Hyperlipidemia, unspecified: Secondary | ICD-10-CM

## 2013-10-28 DIAGNOSIS — K860 Alcohol-induced chronic pancreatitis: Secondary | ICD-10-CM

## 2013-10-28 DIAGNOSIS — I10 Essential (primary) hypertension: Secondary | ICD-10-CM

## 2013-10-28 DIAGNOSIS — Z23 Encounter for immunization: Secondary | ICD-10-CM

## 2013-10-28 DIAGNOSIS — K861 Other chronic pancreatitis: Secondary | ICD-10-CM

## 2013-10-28 DIAGNOSIS — G609 Hereditary and idiopathic neuropathy, unspecified: Secondary | ICD-10-CM

## 2013-10-28 DIAGNOSIS — E109 Type 1 diabetes mellitus without complications: Secondary | ICD-10-CM

## 2013-10-28 DIAGNOSIS — G5793 Unspecified mononeuropathy of bilateral lower limbs: Secondary | ICD-10-CM

## 2013-10-28 DIAGNOSIS — G5790 Unspecified mononeuropathy of unspecified lower limb: Secondary | ICD-10-CM | POA: Insufficient documentation

## 2013-10-28 DIAGNOSIS — R21 Rash and other nonspecific skin eruption: Secondary | ICD-10-CM | POA: Insufficient documentation

## 2013-10-28 LAB — LIPID PANEL
CHOL/HDL RATIO: 2.2 ratio
CHOLESTEROL: 138 mg/dL (ref 0–200)
HDL: 62 mg/dL (ref >39)
LDL Cholesterol: 62 mg/dL (ref 0–99)
Triglycerides: 69 mg/dL (ref <150)
VLDL: 14 mg/dL (ref 0–40)

## 2013-10-28 LAB — CBC WITH DIFFERENTIAL/PLATELET
BASOS ABS: 0 10*3/uL (ref 0.0–0.1)
BASOS PCT: 0 % (ref 0–1)
EOS ABS: 0.1 10*3/uL (ref 0.0–0.7)
EOS PCT: 2 % (ref 0–5)
HCT: 32.9 % — ABNORMAL LOW (ref 36.0–46.0)
Hemoglobin: 11.1 g/dL — ABNORMAL LOW (ref 12.0–15.0)
Lymphocytes Relative: 26 % (ref 12–46)
Lymphs Abs: 1.2 10*3/uL (ref 0.7–4.0)
MCH: 28.8 pg (ref 26.0–34.0)
MCHC: 33.7 g/dL (ref 30.0–36.0)
MCV: 85.5 fL (ref 78.0–100.0)
Monocytes Absolute: 0.4 10*3/uL (ref 0.1–1.0)
Monocytes Relative: 9 % (ref 3–12)
Neutro Abs: 3 10*3/uL (ref 1.7–7.7)
Neutrophils Relative %: 63 % (ref 43–77)
PLATELETS: 219 10*3/uL (ref 150–400)
RBC: 3.85 MIL/uL — ABNORMAL LOW (ref 3.87–5.11)
RDW: 17.3 % — AB (ref 11.5–15.5)
WBC: 4.8 10*3/uL (ref 4.0–10.5)

## 2013-10-28 LAB — COMPLETE METABOLIC PANEL WITH GFR
ALT: 17 U/L (ref 0–35)
AST: 27 U/L (ref 0–37)
Albumin: 3.9 g/dL (ref 3.5–5.2)
Alkaline Phosphatase: 125 U/L — ABNORMAL HIGH (ref 39–117)
BILIRUBIN TOTAL: 0.5 mg/dL (ref 0.2–1.2)
BUN: 14 mg/dL (ref 6–23)
CO2: 23 mEq/L (ref 19–32)
CREATININE: 0.88 mg/dL (ref 0.50–1.10)
Calcium: 9.2 mg/dL (ref 8.4–10.5)
Chloride: 104 mEq/L (ref 96–112)
GFR, Est Non African American: 81 mL/min
Glucose, Bld: 255 mg/dL — ABNORMAL HIGH (ref 70–99)
Potassium: 4.1 mEq/L (ref 3.5–5.3)
Sodium: 134 mEq/L — ABNORMAL LOW (ref 135–145)
Total Protein: 6.6 g/dL (ref 6.0–8.3)

## 2013-10-28 LAB — POCT GLYCOSYLATED HEMOGLOBIN (HGB A1C): Hemoglobin A1C: 8.5

## 2013-10-28 MED ORDER — TRIAMCINOLONE ACETONIDE 0.1 % EX CREA: 1.0000 "application " | TOPICAL_CREAM | Freq: Two times a day (BID) | CUTANEOUS | Status: DC

## 2013-10-28 MED ORDER — INSULIN PEN NEEDLE 31G X 5 MM MISC: Status: DC

## 2013-10-28 MED ORDER — GLUCOSE BLOOD VI STRP: ORAL_STRIP | Status: DC

## 2013-10-28 MED ORDER — ACCU-CHEK ADVANTAGE DIABETES KIT: PACK | Status: DC

## 2013-10-28 MED ORDER — INSULIN GLARGINE 100 UNIT/ML ~~LOC~~ SOLN: 10.0000 [IU] | Freq: Every morning | SUBCUTANEOUS | Status: DC

## 2013-10-28 MED ORDER — ACCU-CHEK FASTCLIX LANCETS MISC: Status: DC

## 2013-10-28 MED ORDER — LISINOPRIL 5 MG PO TABS: 5.0000 mg | ORAL_TABLET | Freq: Every day | ORAL | Status: DC

## 2013-10-28 NOTE — Assessment & Plan Note (Addendum)
Likely d/t  Her diabetes.  Will obtain B12 and Vitamin D today. Explained to pt gaining control of her diabetes is first line treatment. We can also look into gabapentin use in the future, although she has been on this in the past and discontinued it. CBC, CMP also today,.  F/u: 3 weeks

## 2013-10-28 NOTE — Assessment & Plan Note (Addendum)
Mildly over goal. Pt states she has been out of her medications. Refilled lisinopril and asked pt to monitor at home for next two weeks. If elevated >130/80 pt is to call in and be seen. - 3 weeks follow up

## 2013-10-28 NOTE — Assessment & Plan Note (Signed)
Pt still has not been monitoring her BG.  - Will re-call in all her supplies today. - Pt given log book - Pt instructed on testing, fasting each day, prior to meals and bedtime.  - A1c 8.5 today.  - Eye exam scheduled.  - F/U 3 weeks and will likely need to increase Lantus and possible start oral agent (not metformin).

## 2013-10-28 NOTE — Assessment & Plan Note (Signed)
Pt advised to call Dr. Carlean Purl for follow up. She is out of her medications and is complaining of diarrhea and unable to gain weight.  - She has remained alcohol free, with the exception of a few drinks memorial weekend. - Dr. Carlean Purl contact  information given to her today for follow up

## 2013-10-28 NOTE — Patient Instructions (Signed)
Please call you Gi doctor for follow-up to your diarrhea and weight gain issues.  I will call in all your supplies and I want you to start taking your blood glucose every morning (before eating anything), before each meal and before you go to bed. Please write these in the notebook provided to you and we will review it next appointment and make changes to your medications.  Glucose, Blood Sugar, Fasting Blood Sugar This is a test to measure your blood sugar. Glucose is a simple sugar that serves as the main source of energy for the body. The carbohydrates we eat are broken down into glucose (and a few other simple sugars), absorbed by the small intestine, and circulated throughout the body. Most of the body's cells require glucose for energy production; brain and nervous system cells not only rely on glucose for energy, they can only function when glucose levels in the blood remain above a certain level.  The body's use of glucose hinges on the availability of insulin, a hormone produced by the pancreas. Insulin acts as a Control and instrumentation engineer, transporting glucose into the body's cells, directing the body to store excess glucose as glycogen (for short-term storage) and/or as triglycerides in fat cells. We can not live without glucose or insulin, and they must be in balance.  Normally, blood glucose levels rise slightly after a meal, and insulin is secreted to lower them, with the amount of insulin released matched up with the size and content of the meal. If blood glucose levels drop too low, such as might occur in between meals or after a strenuous workout, glucagon (another pancreatic hormone) is secreted to tell the liver to turn some glycogen back into glucose, raising the blood glucose levels. If the glucose/insulin feedback mechanism is working properly, the amount of glucose in the blood remains fairly stable. If the balance is disrupted and glucose levels in the blood rise, then the body tries to restore  the balance, both by increasing insulin production and by excreting glucose in the urine.  PREPARATION FOR TEST A blood sample drawn from a vein in your arm or, for a self check, a drop of blood from a skin prick; in general, it may be recommended that you fast before having a blood glucose test; sometimes a random (no preparation) urine sample is used. Your caregiver will instruct you as to what they want prior to your testing. NORMAL FINDINGS Normal values depend on many factors. Your lab will provide a range of normal values with your test results. The following information summarizes the meaning of the test results. These are based on the clinical practice recommendations of the American Diabetes Association.  FASTING BLOOD GLUCOSE  From 70 to 99 mg/dL (3.9 to 5.5 mmol/L): Normal glucose tolerance  From 100 to 125 mg/dL (5.6 to 6.9 mmol/L):Impaired fasting glucose (pre-diabetes)  126 mg/dL (7.0 mmol/L) and above on more than one testing occasion: Diabetes ORAL GLUCOSE TOLERANCE TEST (OGTT) [EXCEPT PREGNANCY] (2 HOURS AFTER A 75-GRAM GLUCOSE DRINK)  Less than 140 mg/dL (7.8 mmol/L): Normal glucose tolerance  From 140 to 200 mg/dL (7.8 to 11.1 mmol/L): Impaired glucose tolerance (pre-diabetes)  Over 200 mg/dL (11.1 mmol/L) on more than one testing occasion: Diabetes GESTATIONAL DIABETES SCREENING: GLUCOSE CHALLENGE TEST (1 HOUR AFTER A 50-GRAM GLUCOSE DRINK)  Less than 140* mg/dL (7.8 mmol/L): Normal glucose tolerance  140* mg/dL (7.8 mmol/L) and over: Abnormal, needs OGTT (see below) * Some use a cutoff of More Than 130 mg/dL (7.2 mmol/L)  because that identifies 90% of women with gestational diabetes, compared to 80% identified using the threshold of More Than 140 mg/dL (7.8 mmol/L). GESTATIONAL DIABETES DIAGNOSTIC: OGTT (100-GRAM GLUCOSE DRINK)  Fasting*..........................................95 mg/dL (5.3 mmol/L)  1 hour after glucose load*..............180 mg/dL (10.0  mmol/L)  2 hours after glucose load*.............155 mg/dL (8.6 mmol/L)  3 hours after glucose load* **.........140 mg/dL (7.8 mmol/L) * If two or more values are above the criteria, gestational diabetes is diagnosed. ** A 75-gram glucose load may be used, although this method is not as well validated as the 100-gram OGTT; the 3-hour sample is not drawn if 75 grams is used.  Ranges for normal findings may vary among different laboratories and hospitals. You should always check with your doctor after having lab work or other tests done to discuss the meaning of your test results and whether your values are considered within normal limits. MEANING OF TEST  Your caregiver will go over the test results with you and discuss the importance and meaning of your results, as well as treatment options and the need for additional tests if necessary. OBTAINING THE TEST RESULTS It is your responsibility to obtain your test results. Ask the lab or department performing the test when and how you will get your results. Document Released: 05/17/2004 Document Revised: 07/08/2011 Document Reviewed: 03/26/2008 [facility] Patient Information 2015 [place], [place]. This information is not intended to replace advice given to you by your health care provider. Make sure you discuss any questions you have with your health care provider.

## 2013-10-28 NOTE — Assessment & Plan Note (Signed)
Nonspecific rash, mild concern for return of scabies given location.  Pt prescribed steroid cream and given instructions to return if rash worsens or family members also develop rash.

## 2013-10-28 NOTE — Assessment & Plan Note (Deleted)
Pt still has not been monitoring her BG.  - Will re-call in all her supplies today. - Pt given log book - Pt instructed on testing, fasting each day, prior to meals and bedtime.  - A1c 8.5 today.  - Eye exam scheduled.  - F/U 3 weeks and will likely need to increase Lantus and possible start oral agent (not metformin).

## 2013-10-29 LAB — VITAMIN D 25 HYDROXY (VIT D DEFICIENCY, FRACTURES): VIT D 25 HYDROXY: 12 ng/mL — AB (ref 30–89)

## 2013-10-29 LAB — VITAMIN B12: Vitamin B-12: 290 pg/mL (ref 211–911)

## 2013-11-01 ENCOUNTER — Telehealth: Payer: Self-pay | Admitting: Family Medicine

## 2013-11-01 DIAGNOSIS — E559 Vitamin D deficiency, unspecified: Secondary | ICD-10-CM | POA: Insufficient documentation

## 2013-11-01 MED ORDER — VITAMIN D (ERGOCALCIFEROL) 1.25 MG (50000 UNIT) PO CAPS: 50000.0000 [IU] | ORAL_CAPSULE | ORAL | Status: DC

## 2013-11-01 NOTE — Assessment & Plan Note (Signed)
B12 was normal, vitamin D was low at 12. We'll start vitamin D supplementations 50,000 units for 8 weeks. We'll need to retest in 8-10 weeks.

## 2013-11-01 NOTE — Progress Notes (Signed)
   Subjective:    Patient ID: Christine [name], female    DOB: November 17, 1970, 43 y.o.   MRN: [id number]  HPI Christine [name] is a 43 y.o. African American female presents to family medicine clinic today for diabetes followup.  Diabetes/neuropathy: Patient has long-standing history of uncontrolled diabetes type 1. Patient has a known alcohol abuse issue that affects her chronic disease management. Last hemoglobin A1c was 10. Patient states she has been taking Lantus 12 units in the morning. She has been unable to take blood sugars because she no longer has a blood glucose monitor. Of note, and her and also pus had been called in to her her pharmacy over a month ago and she did not pick them up, she states she was unaware they were called in. Patient denies any hyper or/hypoglycemic events. Patient endorses bilateral feet tingling that she describes as "little sticks in her feet ". Patient had been on gabapentin the past but discontinued use.  Hypertension: Blood pressure today is mildly elevated. Patient states she has been without her medications for about a week. She denies headaches, chest pain, visual changes or shortness of breath. She denies orthopnea or edema.  Rash of abdomen: Patient complains of a rash located on her abdomen and her belt line. She states it started about a week ago. She has tried nothing to make it better. It is pruritic in nature. Patient had a history of scabies over 6 months ago. She states nobody in her family have similar rashes this time. She denies nausea, headache, vomit or fever. She has had no outdoor exposure. She denies changing any types of soaps or detergents.  Alcohol abuse/diarrhea/inability to gain weight: Patient has a alcohol abuse problem. Her alcohol use currently affects her inability to gain weight, pancreatitis and diarrhea. She is followed with GI who has been supplying her with Creon. It appears she has called into the office because Creon was not covered by  her insurance, although it should have been. Supplies from the office stock were available to her, however she did not go pick them up because she did not have a ride. It does not appear she has followed up as directed by GI.  Past medical history: Patient is a every day smoker. Review of Systems Per history of present illness    Objective:   Physical Exam BP 148/87  Pulse 86  Temp(Src) 98 F (36.7 C) (Oral)  Ht 5\' 9"  (1.753 m)  Wt 137 lb 3.2 oz (62.234 kg)  BMI 20.25 kg/m2 Gen: Thin African American female. Cooperative with exam. No acute distress, nontoxic in appearance. HEENT: AT. [place].  Bilateral eyes without injections or icterus. MMM.  CV: RRR, no murmurs clicks gallops or rubs Chest: CTAB, no wheeze or crackles Abd: Soft.  NTND. BS present. No Masses palpated.  Ext: No erythema. No edema.  Skin: Linear rash located bilateral right and lower quadrant rashes. Mild erythema, without vesicles. no purpura or petechiae.

## 2013-11-01 NOTE — Telephone Encounter (Signed)
Please call Christine [name] and let her know that her lab results were basically normal with the exception of very low vitamin D. I would like her to start a vitamin D supplementation for 8 weeks. She will take 1 capsule a week, for 8 weeks. I have called this into the pharmacy for her to start as soon as possible. This could be contributing to some of her leg discomfort. I will need to retest her vitamin D. levels in 8-10 weeks. Thanks

## 2013-11-01 NOTE — Telephone Encounter (Signed)
Relayed message,patient voiced understanding. Cristin Penaflor S  

## 2013-11-02 ENCOUNTER — Ambulatory Visit (INDEPENDENT_AMBULATORY_CARE_PROVIDER_SITE_OTHER): Payer: BC Managed Care – PPO | Admitting: Home Health Services

## 2013-11-02 DIAGNOSIS — E109 Type 1 diabetes mellitus without complications: Secondary | ICD-10-CM

## 2013-11-02 NOTE — Progress Notes (Signed)
DIABETES Pt came in to have a retinal scan per diabetic care.   Image was taken and submitted to [facility]-DR. [name] for reading.    Results will be available in 1-2 weeks.  Results will be given to PCP for review and to contact patient.  Christine [name]

## 2013-11-05 ENCOUNTER — Encounter: Payer: Self-pay | Admitting: Home Health Services

## 2013-11-05 DIAGNOSIS — E113299 Type 2 diabetes mellitus with mild nonproliferative diabetic retinopathy without macular edema, unspecified eye: Secondary | ICD-10-CM | POA: Insufficient documentation

## 2013-11-05 DIAGNOSIS — E11329 Type 2 diabetes mellitus with mild nonproliferative diabetic retinopathy without macular edema: Secondary | ICD-10-CM | POA: Insufficient documentation

## 2013-11-09 ENCOUNTER — Telehealth: Payer: Self-pay | Admitting: Family Medicine

## 2013-11-09 NOTE — Telephone Encounter (Signed)
Pt called and needs a referral to [facility] to see Dr. Carlean Purl. jw

## 2013-11-10 ENCOUNTER — Telehealth: Payer: Self-pay | Admitting: Family Medicine

## 2013-11-10 DIAGNOSIS — K860 Alcohol-induced chronic pancreatitis: Secondary | ICD-10-CM

## 2013-11-10 NOTE — Telephone Encounter (Signed)
Please advise.Thank you.Christine [name]  

## 2013-11-10 NOTE — Telephone Encounter (Signed)
GI referral entered as requested.

## 2013-11-11 ENCOUNTER — Telehealth: Payer: Self-pay | Admitting: Internal Medicine

## 2013-11-11 MED ORDER — PANCRELIPASE (LIP-PROT-AMYL) 36000-114000 UNITS PO CPEP: 1.0000 | ORAL_CAPSULE | ORAL | Status: DC

## 2013-11-11 NOTE — Telephone Encounter (Signed)
Refill sent in as pt made appointment.

## 2013-12-07 ENCOUNTER — Telehealth: Payer: Self-pay | Admitting: Family Medicine

## 2013-12-07 NOTE — Telephone Encounter (Signed)
Will need refills on insulin. Cannot be get in to see Dr. Raoul Pitch until 9/15. Please advise.

## 2013-12-08 ENCOUNTER — Other Ambulatory Visit: Payer: Self-pay | Admitting: Family Medicine

## 2013-12-08 NOTE — Telephone Encounter (Signed)
She needs to contact her pharmacy for her insulin, she was given 11 refills in July 2015. Thanks.

## 2013-12-08 NOTE — Telephone Encounter (Signed)
Patient was informed. Christine [name]  

## 2013-12-10 ENCOUNTER — Other Ambulatory Visit: Payer: Self-pay | Admitting: Family Medicine

## 2013-12-10 ENCOUNTER — Telehealth: Payer: Self-pay | Admitting: *Deleted

## 2013-12-10 MED ORDER — INSULIN GLARGINE 100 UNIT/ML ~~LOC~~ SOLN: 12.0000 [IU] | Freq: Every morning | SUBCUTANEOUS | Status: DC

## 2013-12-10 NOTE — Telephone Encounter (Signed)
[facility] called needing clarification on pt's Lantus direction.  Please call them at [phone].  Derl Barrow, RN

## 2014-01-11 ENCOUNTER — Ambulatory Visit: Payer: BC Managed Care – PPO | Admitting: Family Medicine

## 2014-01-13 ENCOUNTER — Encounter: Payer: Self-pay | Admitting: Family Medicine

## 2014-01-13 ENCOUNTER — Ambulatory Visit (INDEPENDENT_AMBULATORY_CARE_PROVIDER_SITE_OTHER): Payer: BC Managed Care – PPO | Admitting: Family Medicine

## 2014-01-13 VITALS — BP 153/82 | HR 89 | Temp 98.1°F | Ht 69.0 in | Wt 143.0 lb

## 2014-01-13 DIAGNOSIS — E1049 Type 1 diabetes mellitus with other diabetic neurological complication: Secondary | ICD-10-CM

## 2014-01-13 DIAGNOSIS — F17200 Nicotine dependence, unspecified, uncomplicated: Secondary | ICD-10-CM

## 2014-01-13 DIAGNOSIS — Z7189 Other specified counseling: Secondary | ICD-10-CM

## 2014-01-13 DIAGNOSIS — Z716 Tobacco abuse counseling: Secondary | ICD-10-CM | POA: Insufficient documentation

## 2014-01-13 DIAGNOSIS — Z0000 Encounter for general adult medical examination without abnormal findings: Secondary | ICD-10-CM | POA: Diagnosis not present

## 2014-01-13 DIAGNOSIS — E1142 Type 2 diabetes mellitus with diabetic polyneuropathy: Secondary | ICD-10-CM

## 2014-01-13 DIAGNOSIS — Z23 Encounter for immunization: Secondary | ICD-10-CM | POA: Diagnosis not present

## 2014-01-13 DIAGNOSIS — I10 Essential (primary) hypertension: Secondary | ICD-10-CM | POA: Diagnosis not present

## 2014-01-13 DIAGNOSIS — E1040 Type 1 diabetes mellitus with diabetic neuropathy, unspecified: Secondary | ICD-10-CM

## 2014-01-13 MED ORDER — LISINOPRIL 10 MG PO TABS: 10.0000 mg | ORAL_TABLET | Freq: Every day | ORAL | Status: DC

## 2014-01-13 MED ORDER — NICOTINE 14 MG/24HR TD PT24: 14.0000 mg | MEDICATED_PATCH | Freq: Every day | TRANSDERMAL | Status: DC

## 2014-01-13 MED ORDER — GABAPENTIN 100 MG PO CAPS: ORAL_CAPSULE | ORAL | Status: DC

## 2014-01-13 MED ORDER — NICOTINE POLACRILEX 2 MG MT GUM: 2.0000 mg | CHEWING_GUM | OROMUCOSAL | Status: DC | PRN

## 2014-01-13 NOTE — Assessment & Plan Note (Addendum)
Patient doing well blood in her blood glucose. Will change Lantus from morning to night dose, given patient's bedtime sugars are the highest of the day. Patient instructed, and able to read back, taking morning blood sugar and of above 110 she was 2 units to her nighttime Lantus dose. She will continue to do so until she gets a fasting morning glucose below 110. AVS instruction was also provided. Diabetic foot exam was completed today. Mild to moderate neuropathy will start gabapentin taper patient had mild to minimal diabetic retinopathy. Will need followup in 6-12 months.

## 2014-01-13 NOTE — Progress Notes (Signed)
   Subjective:    Patient ID: Christine [name], female    DOB: 1970/08/13, 43 y.o.   MRN: [id number]  HPI Christine [name] is a 43 y.o. female for diabetes f/u   Diabetes with neuropathy, and mild retinopathy: Patient's last A1c was 8.5. We have restarted Lantus and patient is currently at 12 units daily. She takes her Lantus in the morning, and has been taking her blood glucose with her new monitor. Patient brings her log with her today and her fasting morning glucose is over the last few weeks have been 99-129. She has sugars in the high 200s to 300s before bedtime. She complains of bilateral lower extremity neuropathy, especially in her feet. She states she seen mild improvement since starting her Lantus but they are still burning. She is interested in re\re attempting gabapentin. She has had her retinopathy exam in July 2015 with mild to minimal retinopathy. She denies any open lesions or wounds on her feet.  Hypertension: Patient has remained mildly of her goal, despite lisinopril 5 mg daily. Patient reports compliance with medication. She denies chest pain, palpitations, shortness of breath, dizziness or headaches.  Smoking cessation: Patient continues to smoke daily, half a pack a day for the past 20 years. She does desire to quit smoking. She states her confidence level is 8, on a scale of 1-10. Her desire to quit smoking if at 10, on a scale of 1-10. She has interest in using patches and nicotine gum to help her quit smoking.  Health maintenance: Patient is overdue for Pap smear. Last Pap was in 2010 and normal. Does not appear to have any abnormal Paps in the past. Patient is due for a flu shot.  Past Medical History  Diagnosis Date  . Diabetes mellitus   . Hypertension   . Substance abuse   . Chronic pancreatitis   . Alcohol abuse     Review of Systems Per HPI    Objective:   Physical Exam BP 153/82  Pulse 89  Temp(Src) 98.1 F (36.7 C) (Oral)  Ht 5\' 9"  (1.753 m)  Wt 143 lb  (64.864 kg)  BMI 21.11 kg/m2  LMP 12/23/2013 Gen: Pleasant, African American female, no acute distress, nontoxic in appearance, well-developed. HEENT: AT. [place].  Bilateral eyes without injections or icterus. MMM.  CV: RRR no murmur Chest: CTAB, no wheeze or crackles Abd: Soft.NTND. BS positive. No Masses palpated.  Ext: No erythema. No edema.  Skin: No rashes, purpura or petechiae.  Neuro:Normal gait. PERLA. EOMi. Alert. Cranial nerves II through XII intact  Psych: Appropriate affect, dress, demeanor and mood. Normal speech Foot exam completed and documented in quality metrics.       Assessment & Plan:

## 2014-01-13 NOTE — Assessment & Plan Note (Addendum)
Quit line information given to patient today. Patient encouraged to call and be registered with them. AVS on smoking cessation benefits given today Nicotine gums and patches prescribed Patient's a quit date today for 01/17/2014. Patient to throughout cigarettes on September 20 and placed patch prior to going to bed. Followup in one month

## 2014-01-13 NOTE — Patient Instructions (Signed)
Smoking Cessation Quitting smoking is important to your health and has many advantages. However, it is not always easy to quit since nicotine is a very addictive drug. Oftentimes, people try 3 times or more before being able to quit. This document explains the best ways for you to prepare to quit smoking. Quitting takes hard work and a lot of effort, but you can do it. ADVANTAGES OF QUITTING SMOKING  You will live longer, feel better, and live better.  Your body will feel the impact of quitting smoking almost immediately.  Within 20 minutes, blood pressure decreases. Your pulse returns to its normal level.  After 8 hours, carbon monoxide levels in the blood return to normal. Your oxygen level increases.  After 24 hours, the chance of having a heart attack starts to decrease. Your breath, hair, and body stop smelling like smoke.  After 48 hours, damaged nerve endings begin to recover. Your sense of taste and smell improve.  After 72 hours, the body is virtually free of nicotine. Your bronchial tubes relax and breathing becomes easier.  After 2 to 12 weeks, lungs can hold more air. Exercise becomes easier and circulation improves.  The risk of having a heart attack, stroke, cancer, or lung disease is greatly reduced.  After 1 year, the risk of coronary heart disease is cut in half.  After 5 years, the risk of stroke falls to the same as a nonsmoker.  After 10 years, the risk of lung cancer is cut in half and the risk of other cancers decreases significantly.  After 15 years, the risk of coronary heart disease drops, usually to the level of a nonsmoker.  If you are pregnant, quitting smoking will improve your chances of having a healthy baby.  The people you live with, especially any children, will be healthier.  You will have extra money to spend on things other than cigarettes. QUESTIONS TO THINK ABOUT BEFORE ATTEMPTING TO QUIT You may want to talk about your answers with your  health care provider.  Why do you want to quit?  If you tried to quit in the past, what helped and what did not?  What will be the most difficult situations for you after you quit? How will you plan to handle them?  Who can help you through the tough times? Your family? Friends? A health care provider?  What pleasures do you get from smoking? What ways can you still get pleasure if you quit? Here are some questions to ask your health care provider:  How can you help me to be successful at quitting?  What medicine do you think would be best for me and how should I take it?  What should I do if I need more help?  What is smoking withdrawal like? How can I get information on withdrawal? GET READY  Set a quit date.  Change your environment by getting rid of all cigarettes, ashtrays, matches, and lighters in your home, car, or work. Do not let people smoke in your home.  Review your past attempts to quit. Think about what worked and what did not. GET SUPPORT AND ENCOURAGEMENT You have a better chance of being successful if you have help. You can get support in many ways.  Tell your family, friends, and coworkers that you are going to quit and need their support. Ask them not to smoke around you.  Get individual, group, or telephone counseling and support. Programs are available at local hospitals and health centers. Call   your local health department for information about programs in your area.  Spiritual beliefs and practices may help some smokers quit.  Download a "quit meter" on your computer to keep track of quit statistics, such as how long you have gone without smoking, cigarettes not smoked, and money saved.  Get a self-help book about quitting smoking and staying off tobacco. [place] yourself from urges to smoke. Talk to someone, go for a walk, or occupy your time with a task.  Change your normal routine. Take a different route to work.  Drink tea instead of coffee. Eat breakfast in a different place.  Reduce your stress. Take a hot bath, exercise, or read a book.  Plan something enjoyable to do every day. Reward yourself for not smoking.  Explore interactive web-based programs that specialize in helping you quit. GET MEDICINE AND USE IT CORRECTLY Medicines can help you stop smoking and decrease the urge to smoke. Combining medicine with the above behavioral methods and support can greatly increase your chances of successfully quitting smoking.  Nicotine replacement therapy helps deliver nicotine to your body without the negative effects and risks of smoking. Nicotine replacement therapy includes nicotine gum, lozenges, inhalers, nasal sprays, and skin patches. Some may be available over-the-counter and others require a prescription.  Antidepressant medicine helps people abstain from smoking, but how this works is unknown. This medicine is available by prescription.  Nicotinic receptor partial agonist medicine simulates the effect of nicotine in your brain. This medicine is available by prescription. Ask your health care provider for advice about which medicines to use and how to use them based on your health history. Your health care provider will tell you what side effects to look out for if you choose to be on a medicine or therapy. Carefully read the information on the package. Do not use any other product containing nicotine while using a nicotine replacement product.  RELAPSE OR DIFFICULT SITUATIONS Most relapses occur within the first 3 months after quitting. Do not be discouraged if you start smoking again. Remember, most people try several times before finally quitting. You may have symptoms of withdrawal because your body is used to nicotine. You may crave cigarettes, be irritable, feel very hungry, cough often, get headaches, or have difficulty concentrating. The withdrawal symptoms are only temporary. They are strongest  when you first quit, but they will go away within 10-14 days. To reduce the chances of relapse, try to:  Avoid drinking alcohol. Drinking lowers your chances of successfully quitting.  Reduce the amount of caffeine you consume. Once you quit smoking, the amount of caffeine in your body increases and can give you symptoms, such as a rapid heartbeat, sweating, and anxiety.  Avoid smokers because they can make you want to smoke.  Do not let weight gain distract you. Many smokers will gain weight when they quit, usually less than 10 pounds. Eat a healthy diet and stay active. You can always lose the weight gained after you quit.  Find ways to improve your mood other than smoking. FOR MORE INFORMATION  www.smokefree.gov  Document Released: 04/09/2001 Document Revised: 08/30/2013 Document Reviewed: 07/25/2011 [facility] Patient Information 2015 [place], [place]. This information is not intended to replace advice given to you by your health care provider. Make sure you discuss any questions you have with your health care provider.   I will call you in a prescription for nicotine gum and patches. Please call in register at the smoking quit  line phone number 1-800-Quit- NOW Please make an appointment to have your Pap smear completed I will call you a gabapentin prescription for your neuropathy Your blood pressure was mildly elevated today we will start you on lisinopril 10 mg a day. I have called in a new prescription for this. You're doing well with keeping your blood sugars monitored in your book. Please continue to do this. As discussed today, I want you to take her fasting morning blood sugar if it is above 110, I want you to add 2 units of Lantus to do at night dose. Continue to do this until you see a blood sugar below 1 10 in the morning. Start taking her Lantus between 9 and 10 PM at night. Please make note he received her flu shot today.

## 2014-01-13 NOTE — Assessment & Plan Note (Signed)
Patient continues to be over goal again today. Increase lisinopril dose to 10 mg daily. Will need BMP next visit Followup in 2-4 weeks

## 2014-01-13 NOTE — Assessment & Plan Note (Signed)
Flu shot administered today. Patient to make appointment for Pap smear soon as possible. Last Pap in 2010, will need repeat Pap and HPV reflex.

## 2014-01-19 ENCOUNTER — Ambulatory Visit: Payer: BC Managed Care – PPO | Admitting: Internal Medicine

## 2014-03-10 ENCOUNTER — Encounter: Payer: Self-pay | Admitting: Family Medicine

## 2014-03-10 ENCOUNTER — Ambulatory Visit (INDEPENDENT_AMBULATORY_CARE_PROVIDER_SITE_OTHER): Payer: BC Managed Care – PPO | Admitting: Family Medicine

## 2014-03-10 VITALS — BP 142/90 | HR 82 | Temp 98.1°F | Ht 69.0 in | Wt 149.8 lb

## 2014-03-10 DIAGNOSIS — E1041 Type 1 diabetes mellitus with diabetic mononeuropathy: Secondary | ICD-10-CM

## 2014-03-10 DIAGNOSIS — E559 Vitamin D deficiency, unspecified: Secondary | ICD-10-CM

## 2014-03-10 DIAGNOSIS — I10 Essential (primary) hypertension: Secondary | ICD-10-CM

## 2014-03-10 DIAGNOSIS — E1040 Type 1 diabetes mellitus with diabetic neuropathy, unspecified: Secondary | ICD-10-CM

## 2014-03-10 DIAGNOSIS — R21 Rash and other nonspecific skin eruption: Secondary | ICD-10-CM

## 2014-03-10 LAB — POCT GLYCOSYLATED HEMOGLOBIN (HGB A1C): Hemoglobin A1C: 6.8

## 2014-03-10 LAB — POCT SKIN KOH: Skin KOH, POC: NEGATIVE

## 2014-03-10 LAB — HIV ANTIBODY (ROUTINE TESTING W REFLEX): HIV 1&2 Ab, 4th Generation: NONREACTIVE

## 2014-03-10 MED ORDER — PREDNISONE 20 MG PO TABS: 20.0000 mg | ORAL_TABLET | Freq: Every day | ORAL | Status: DC

## 2014-03-10 MED ORDER — TRIAMCINOLONE ACETONIDE 0.1 % EX CREA: 1.0000 "application " | TOPICAL_CREAM | Freq: Two times a day (BID) | CUTANEOUS | Status: DC

## 2014-03-10 NOTE — Patient Instructions (Signed)
We will call you with the results of your Vit.d and skin scraping once they are available.  Increase your gabapentin to 300 mg three times a day for your neuropathy.  I will prescribed you oral steroids and cream for your rash I will need to see you in 2 weeks Check your blood pressures at home at least 3 times this week and call us to let us know if it is above 140/80. Continue to watch the salt content in your diet and do not add salt to your diet. Do not forget to make an appointment to have your Pap smear completed.

## 2014-03-11 ENCOUNTER — Telehealth: Payer: Self-pay | Admitting: Family Medicine

## 2014-03-11 ENCOUNTER — Encounter: Payer: Self-pay | Admitting: Family Medicine

## 2014-03-11 DIAGNOSIS — E559 Vitamin D deficiency, unspecified: Secondary | ICD-10-CM

## 2014-03-11 LAB — VITAMIN D 25 HYDROXY (VIT D DEFICIENCY, FRACTURES): Vit D, 25-Hydroxy: <10 ng/mL — ABNORMAL LOW (ref 30–89)

## 2014-03-11 LAB — RPR

## 2014-03-11 MED ORDER — VITAMIN D (ERGOCALCIFEROL) 1.25 MG (50000 UNIT) PO CAPS: 50000.0000 [IU] | ORAL_CAPSULE | ORAL | Status: DC

## 2014-03-11 MED ORDER — GABAPENTIN 300 MG PO CAPS: 300.0000 mg | ORAL_CAPSULE | Freq: Three times a day (TID) | ORAL | Status: DC

## 2014-03-11 NOTE — Assessment & Plan Note (Signed)
Patient had not presented for vitamin D recheck, she is still symptomatic today with fatigue. Vitamin D collected, < 10 today. This is lower than her prior reading before taking vitamin D supplementation. I have ordered 50,000 units to be taken once a week for the next 8 weeks. Patient is then to purchase an over-the-counter preparation of calcium and vitamin D (800 units) and take this daily. I have also placed future orders to have CMP and PTH with calcium  Collected. It is likely African-American descent, low sun exposure and her chronic diarrhea prevent her from absorbing adequate vitamin D but will need to rule out other causes. Follow-up in 8 weeks to have vitamin D, rechecked

## 2014-03-11 NOTE — Telephone Encounter (Signed)
Spoke with patient and informed her of below results, she will make a lab appointment at the end of taking her Vitamin D rx

## 2014-03-11 NOTE — Telephone Encounter (Signed)
Please call pt, her skin scraping is negative for fungus. Blood work was negative for HIV or Syphilis, which can also cause rash.If her rash has no improvement with steroids and cream then she will need to come in to have a biopsy of one of the areas. Her Vitamin D was extremely low again, even lower than last time. I have called in medication for her and she will need to take one a week for next 8 week. She should then take an over the counter supplement of calcium (1200) and vit.D (800u). The pharmacist can help her with choices. I will also need to collect additional labs to test other factors that could be causing her deficiency. I have placed these orders and she will just need a a lab appointment for them to be drawn.  Thanks.

## 2014-03-11 NOTE — Assessment & Plan Note (Addendum)
Patient with rash over the majority of her body today. Not a clear etiology for the rash. HIV and RPR are negative today. Skin scraping negative for fungal infection. Considering the extent of the rash, we will attempt to treat with 20 mg oral steroids for 5 days. Patient educated that she will likely see a rise in her blood sugars over the next couple of days due to the steroid use. Topical Kenalog cream to be applied to irritated/pruritic areas only. Possibly eczema versus plaque psoriasis. Patient to follow-up in 2 weeks if no improvement in rash, at that time we may need to take a biopsy or send to dermatology.

## 2014-03-11 NOTE — Progress Notes (Signed)
   Subjective:    Patient ID: Christine [name], female    DOB: [birth year], 43 y.o.   MRN: [id number]  HPI  Diabetes with neuropathy: patient has been taking Lantus 12 units at night. She increasingly has more neuropathy in her feet. She reports no hyper or hypoglycemic events. She states that her morning glucoses are between 76 and 90 usually. She has had one that was high for her at 155. She denies any open or nonhealing wounds on her lower extremity today.  Hypertension: Patient states that she takes her lisinopril daily as directed. She denies any chest pains, palpitations, dizziness or syncope. She states that she takes her blood pressures at home and they are "normal ". With further investigation she has had some blood pressures with 427'C systolic. She denies diastolics above 80. She does not watch the salt content in her diet. She does not have an exercise routine.  Rash: Patient complains of a rash that is over her arms, abdomen, back, legs and palms. She has one area on her face as well. She states that sometimes pruritic. They seem to go through phases where they will look darker than her skin, and then when they heal they appear lighter than her skin. She has tried anything over-the-counter. She denies any changes in lotions, perfumes, detergents etc. She had a scabies infection last year, but states that improved. She also remembers having a similar, but not as bad, rash outbreak a few years ago in which she was sent to dermatology and it was biopsied. She is not certain what the results of the biopsy were, but states that they gave her a cream and it went away.  Health maintenance: Patient's original appointment was for Pap smear, she states that she is currently on her menses therefore will postpone her Pap smear today.  Every day smoker  Past Medical History  Diagnosis Date  . Diabetes mellitus   . Hypertension   . Substance abuse   . Chronic pancreatitis   . Alcohol abuse     No  Known Allergies  Review of Systems Per history of present illness    Objective:   Physical Exam BP 142/90 mmHg  Pulse 82  Temp(Src) 98.1 F (36.7 C) (Oral)  Ht 5\' 9"  (1.753 m)  Wt 149 lb 12.8 oz (67.949 kg)  BMI 22.11 kg/m2 Gen: pleasant, African-American female, no acute distress, nontoxic. HEENT: AT. [place]. Bilateral TM visualized and normal in appearance. Bilateral eyes without injections or icterus. MMM. Bilateral nares erythema or swelling. Throat without erythema or exudates.  CV: RRR  Chest: CTAB, no wheeze or crackles Abd: Soft. round. NTND. BS present. no Masses palpated.  Ext: No erythema. No edema. Post 2/4 PT/DP Skin: no purpura or petechiae. Extensive hypopigmented rash over bilateral arms, chest, back and legs. Small hypopigmented area left cheek. Moderate plaque like hyperpigmentation noted rash over arms/left neck and legs. Hyperpigmented Lesions appear scaly. 2 hyperpigmented lesions left palm.    Assessment & Plan:

## 2014-03-11 NOTE — Assessment & Plan Note (Signed)
Patient's blood pressure over goal at 158/86 initially today. Repeat BP 142/90. Patient takes her blood pressure home, and states that she has not noticed them to be this high. Goal blood pressure for her would be 140/80. Patient reports compliance with lisinopril daily. Advised patient to follow a low-salt diet. Take her blood pressures approximately 3 times over the next week, and call to make an appointment if she sees blood pressures over goal.

## 2014-03-11 NOTE — Assessment & Plan Note (Addendum)
Patient's A1c today is 6.8. This is an improvement for her. She is to continue 12 units of Lantus and check her fasting blood glucose daily. She continues to have increased neuropathy pain, will increase gabapentin 300 mg 3 times a day. Follow-up in 3 months

## 2014-03-22 ENCOUNTER — Telehealth: Payer: Self-pay | Admitting: Family Medicine

## 2014-03-22 NOTE — Telephone Encounter (Signed)
LMOVM for pt to return call .Fleeger, Jessica Bridgid  

## 2014-03-22 NOTE — Telephone Encounter (Signed)
Please call the patient, I received a fax prescription request from an outsourced pharmacy for compound creams for what appears to be joint and muscle pain and something for scar formation. Please ask her if she requested these to be sent to our office for approval, and what type of evaluation she received for the pharmacy to suggest these medications. In addition please make her aware that these will likely not be completely, if at all, covered by her insurance. She should call the pharmacist asked out-of-pocket prices well. Thank you

## 2014-08-22 ENCOUNTER — Ambulatory Visit: Payer: Medicaid Other | Admitting: Family Medicine

## 2014-08-31 ENCOUNTER — Ambulatory Visit (INDEPENDENT_AMBULATORY_CARE_PROVIDER_SITE_OTHER): Payer: Medicaid Other | Admitting: Family Medicine

## 2014-08-31 ENCOUNTER — Encounter: Payer: Self-pay | Admitting: Family Medicine

## 2014-08-31 VITALS — BP 161/88 | HR 85 | Temp 98.1°F | Ht 69.0 in | Wt 150.9 lb

## 2014-08-31 DIAGNOSIS — I10 Essential (primary) hypertension: Secondary | ICD-10-CM | POA: Diagnosis not present

## 2014-08-31 DIAGNOSIS — E559 Vitamin D deficiency, unspecified: Secondary | ICD-10-CM | POA: Diagnosis not present

## 2014-08-31 DIAGNOSIS — E1040 Type 1 diabetes mellitus with diabetic neuropathy, unspecified: Secondary | ICD-10-CM

## 2014-08-31 DIAGNOSIS — B360 Pityriasis versicolor: Secondary | ICD-10-CM | POA: Diagnosis not present

## 2014-08-31 DIAGNOSIS — E1041 Type 1 diabetes mellitus with diabetic mononeuropathy: Secondary | ICD-10-CM

## 2014-08-31 LAB — COMPREHENSIVE METABOLIC PANEL
ALT: 15 U/L (ref 0–35)
AST: 24 U/L (ref 0–37)
Albumin: 4.3 g/dL (ref 3.5–5.2)
Alkaline Phosphatase: 110 U/L (ref 39–117)
BUN: 14 mg/dL (ref 6–23)
CO2: 20 meq/L (ref 19–32)
CREATININE: 0.88 mg/dL (ref 0.50–1.10)
Calcium: 9.1 mg/dL (ref 8.4–10.5)
Chloride: 109 mEq/L (ref 96–112)
Glucose, Bld: 110 mg/dL — ABNORMAL HIGH (ref 70–99)
Potassium: 4.4 mEq/L (ref 3.5–5.3)
Sodium: 140 mEq/L (ref 135–145)
Total Bilirubin: 0.3 mg/dL (ref 0.2–1.2)
Total Protein: 7.3 g/dL (ref 6.0–8.3)

## 2014-08-31 LAB — POCT GLYCOSYLATED HEMOGLOBIN (HGB A1C): HEMOGLOBIN A1C: 6.7

## 2014-08-31 MED ORDER — ITRACONAZOLE 200 MG PO TABS: 200.0000 mg | ORAL_TABLET | Freq: Every day | ORAL | Status: DC

## 2014-08-31 MED ORDER — GABAPENTIN 300 MG PO CAPS: ORAL_CAPSULE | ORAL | Status: DC

## 2014-08-31 NOTE — Progress Notes (Signed)
   Subjective:    Patient ID: Christine [name], female    DOB: [birth year], 44 y.o.   MRN: [id number]  HPI  Diabetes with neuropathy:  Rash:  Vitamin D deficiency:  tobacco abuse:  Hypertension:  Current every day smoker Past Medical History  Diagnosis Date  . Diabetes mellitus   . Hypertension   . Substance abuse   . Chronic pancreatitis   . Alcohol abuse    No Known Allergies No past surgical history on file. History   Social History  . Marital Status: Single    Spouse Name: N/A  . Number of Children: 3  . Years of Education: N/A   Occupational History  . personal care assistant    Social History Main Topics  . Smoking status: Current Every Day Smoker -- 0.50 packs/day for 20 years    Types: Cigarettes  . Smokeless tobacco: Never Used     Comment: cutting down to   . Alcohol Use: Yes     Comment: stopped drinking beers a month ago  . Drug Use: No  . Sexual Activity: Not on file   Other Topics Concern  . Not on file   Social History Narrative   Single, 2 sons one daughter. She is working as a Optician, dispensing. She is to drink several beers a day but stopped recently. It is September 2014. 4 caffeinated beverages daily.     Review of Systems Per hPI    Objective:   Physical Exam BP 161/88 mmHg  Pulse 85  Temp(Src) 98.1 F (36.7 C) (Oral)  Ht 5\' 9"  (1.753 m)  Wt 150 lb 14.4 oz (68.448 kg)  BMI 22.27 kg/m2  LMP 08/30/2014 Gen: NAD. Nontoxic in appearance, well-developed, well-nourished, African-American female. HEENT: AT. [place]. Bilateral eyes without injections or icterus. MMM. CV: RRR no murmurs clicks gallops or rubs Chest: CTAB, no wheeze or crackles Abd: Soft. Round. NTND. BS present. No Masses palpated.  Ext: No erythema. No edema. [place] 2/4 PT Skin: Hypopigmented rash over bilateral arms, legs and abdomen.   Foot Exam: Documented under quality metrics. Sensation appears intact, but patient does have burning neuropathy sensations.        Assessment & Plan:

## 2014-08-31 NOTE — Assessment & Plan Note (Signed)
Patient blood pressure is elevated today above goal of 140/90, but she did not take her medications today. I have asked her to restart her medications and make sure to take them daily. Take her blood pressure at home over the next week and if she is seeing readings above 140/90 and she is to make a nurse appointment to have her blood pressure evaluated here. We may need to increase her lisinopril. Follow-up nurse visit within a week if home blood pressures above 140/90, otherwise follow-up in 3 months.

## 2014-08-31 NOTE — Patient Instructions (Addendum)
It was a pleasure seeing you again today Klag[place]oh. Continue to watch your diet, get exercise and take your Lantus as directed. If you're seeing blood glucose in the morning it is lower than 70, consider eating a small protein snack before you go to bed the night before. I will increase her gabapentin to 300mg  and the morning 600mg  and the afternoon and 600mg  in the evening. We will call you with the lab work results if they are abnormal, otherwise we will send your labs to you by mail. I would like you to take a medication called high after condom is all, for 5 days. This may help with your rash. I have included some information below that may help you understand what I think the rashes. Make certain to make an appointment to have your Pap smear completed.  Tinea Versicolor Tinea versicolor is a common yeast infection of the skin. This condition becomes known when the yeast on our skin starts to overgrow (yeast is a normal inhabitant on our skin). This condition is noticed as white or light brown patches on brown skin, and is more evident in the summer on tanned skin. These areas are slightly scaly if scratched. The light patches from the yeast become evident when the yeast creates "holes in your suntan". This is most often noticed in the summer. The patches are usually located on the chest, back, pubis, neck and body folds. However, it may occur on any area of body. Mild itching and inflammation (redness or soreness) may be present. DIAGNOSIS  The diagnosisof this is made clinically (by looking). Cultures from samples are usually not needed. Examination under the microscope may help. However, yeast is normally found on skin. The diagnosis still remains clinical. Examination under Wood's Ultraviolet Light can determine the extent of the infection. TREATMENT  This common infection is usually only of cosmetic (only a concern to your appearance). It is easily treated with dandruff shampoo used during showers or  bathing. Vigorous scrubbing will eliminate the yeast over several days time. The light areas in your skin may remain for weeks or months after the infection is cured unless your skin is exposed to sunlight. The lighter or darker spots caused by the fungus that remain after complete treatment are not a sign of treatment failure; it will take a long time to resolve. Your caregiver may recommend a number of commercial preparations or medication by mouth if home care is not working. Recurrence is common and preventative medication may be necessary. This skin condition is not highly contagious. Special care is not needed to protect close friends and family members. Normal hygiene is usually enough. Follow up is required only if you develop complications (such as a secondary infection from scratching), if recommended by your caregiver, or if no relief is obtained from the preparations used. Document Released: 04/12/2000 Document Revised: 07/08/2011 Document Reviewed: 05/25/2008 [facility] Patient Information 2015 [place], [place]. This information is not intended to replace advice given to you by your health care provider. Make sure you discuss any questions you have with your health care provider.

## 2014-08-31 NOTE — Assessment & Plan Note (Signed)
Patient has hypopigmented itchy rash over arms, abdomen and legs. She has been seen prior for rash, but this appears different. I have prescribed itraconazole 200 mg daily for 5 days. A she is to follow-up in 2 weeks if this does not improve her rash.

## 2014-08-31 NOTE — Assessment & Plan Note (Signed)
Vitamin D rechecked today. I discussed with patient that if her vitamin D is normal after we had repleted it, I would still want her to take oral vitamin D 800 units daily. If this rechecked comes back low, we will do another 8 weeks of the 50,000 units weekly. Discussed with patient that when her vitamin D was low, she was having malabsorption problems with her pancreatic insufficiency. In addition patient is African-American female, though there are multifocal reasons why she could possibly have low vitamin D. She no longer has diarrhea, or is having problems with malabsorption, so hopefully her vitamin D will be normal on this rechecked. Patient will be called with results once they are available

## 2014-08-31 NOTE — Assessment & Plan Note (Addendum)
Patient's A1c today is much improved at 6.7. She still is experiencing some neuropathy, though we will increase her dose of gabapentin to 300 mg in the morning 600 mg in the afternoon and 600 mg in the evening. Patient is to continue her current diabetic regimen is Lantus 12 units daily and continue to watch her diet, eating plenty of healthy options of vegetables and low-fat meats. CMP was collected today. Follow up in 3 months.

## 2014-09-01 LAB — VITAMIN D 25 HYDROXY (VIT D DEFICIENCY, FRACTURES)

## 2014-09-01 LAB — PTH, INTACT AND CALCIUM
CALCIUM: 9.1 mg/dL (ref 8.4–10.5)
PTH: 146 pg/mL — AB (ref 14–64)

## 2014-09-02 ENCOUNTER — Telehealth: Payer: Self-pay | Admitting: Family Medicine

## 2014-09-02 ENCOUNTER — Encounter: Payer: Self-pay | Admitting: Family Medicine

## 2014-09-02 DIAGNOSIS — E559 Vitamin D deficiency, unspecified: Secondary | ICD-10-CM

## 2014-09-02 MED ORDER — VITAMIN D (ERGOCALCIFEROL) 1.25 MG (50000 UNIT) PO CAPS: 50000.0000 [IU] | ORAL_CAPSULE | ORAL | Status: DC

## 2014-09-02 NOTE — Telephone Encounter (Signed)
Pt informed and agreeable. Christine [name] Ambyr  

## 2014-09-02 NOTE — Telephone Encounter (Signed)
I attempted to call pt, but she was unavailable. Please attempt to call her again. I have also sent a letter with this information. Patient's labs showed extremely low vitamin D levels. I also tested her parathyroid hormone which was very HIGH. This could be because of the extreme low vitamin D and it could be a PTH disorder as well. I will want to treat her for 8 weeks with Vitamin D that I have already called in for her and it should be ready for pick up. We will then need to test vitamin D and PTH again at the end of the treatment (in 9 weeks by lab appointment- already placed). If they still remain abnormal we will need to consider endocrinology referral.

## 2014-09-02 NOTE — Progress Notes (Signed)
I was preceptor for this office visit.  

## 2014-10-11 ENCOUNTER — Encounter: Payer: Self-pay | Admitting: Family Medicine

## 2014-10-11 ENCOUNTER — Ambulatory Visit (INDEPENDENT_AMBULATORY_CARE_PROVIDER_SITE_OTHER): Payer: Self-pay | Admitting: Family Medicine

## 2014-10-11 VITALS — BP 141/81 | HR 85 | Temp 98.1°F | Ht 70.0 in | Wt 155.0 lb

## 2014-10-11 DIAGNOSIS — E1040 Type 1 diabetes mellitus with diabetic neuropathy, unspecified: Secondary | ICD-10-CM

## 2014-10-11 DIAGNOSIS — B360 Pityriasis versicolor: Secondary | ICD-10-CM

## 2014-10-11 DIAGNOSIS — E559 Vitamin D deficiency, unspecified: Secondary | ICD-10-CM

## 2014-10-11 DIAGNOSIS — E1041 Type 1 diabetes mellitus with diabetic mononeuropathy: Secondary | ICD-10-CM

## 2014-10-11 DIAGNOSIS — M792 Neuralgia and neuritis, unspecified: Secondary | ICD-10-CM

## 2014-10-11 MED ORDER — KETOCONAZOLE 2 % EX SHAM: MEDICATED_SHAMPOO | CUTANEOUS | Status: DC

## 2014-10-11 MED ORDER — GABAPENTIN 300 MG PO CAPS: ORAL_CAPSULE | ORAL | Status: DC

## 2014-10-11 NOTE — Assessment & Plan Note (Signed)
Christine [name] is a 44 y.o.  presented to family medicine clinic for follow-up: - Vitamin D deficiency: Will repeat vitamin D level today, although I would suspect it would still be low considering it doesn't sound as if she got 8 pills, she may have gotten a pills and took 2 pills a week. If vitamin D is still low patient will need another 8 weeks of medication, and then she will need follow-up with vitamin D and parathyroid hormone level.  - Diabetes with neuropathy: Patient was instructed to take 8 units of Lantus tonight, recheck her fasting blood sugars every morning if she has a blood sugar above 110, she is to increase that nights Lantus dose by one unit, she is to continue doing this until she sees blood sugars below 110. Whatever dose she ended on will be her new Lantus dose. Increased gabapentin to 900 mg 3 times a day today. Patient's creatinine has been normal. If she does not get good result with the higher dosage, I would consider adding nortriptyline to her regimen.  - Tinea versicolor: We'll try ketoconazole shampoo, patient was given instructions on proper use and not to overuse. Follow-up in 8 weeks to have vitamin D and parathyroid hormone rechecked. Follow-up in 3 months on diabetes

## 2014-10-11 NOTE — Progress Notes (Signed)
Subjective:    Patient ID: Christine [name], female    DOB: 06-Jun-1970, 44 y.o.   MRN: [id number]  HPI  Vitamin D deficiency: She presents to family medicine clinic for follow-up on vitamin D deficiency. Patient states that she was only given 4 capsules, and took one every Saturday for 4 weeks. She also had high parathyroid hormone at that time. Patient is 6 weeks out from taking vitamin D supplementation.  Diabetes: Patient reports she had some episodes where she was feeling shaky, she needed to eat some candy and she felt immediately better. At that time her blood glucose is were in the 70s. She has backed down on her Lantus to 8 units at night. She does not bring in her blood glucose monitors but states her blood glucose is anywhere from 79-120. She is now seeing blood glucose is between 130s and 140s at the highest. She denies any nonhealing wounds. She has severe bilateral lower extremity neuropathy, is taking gabapentin 300 mg in the morning 600 mg at noon and 600 mg at night, and she does not feel this is very helpful. Still feels like pins and needles in her legs. Take Advil for short-term relief.  Tinea versicolor: Patient was prescribed terconazole for large surface area of tinea versicolor over her body. She states she did not pick this up because it was not to be paid for by her insurance. Has been using Vaseline without which she feels is a slight improvement. But would like another medication called and if possible.  Health maintenance: Patient is due for Pap smear, she would like to wait to another appointment to have this completed.   Review of Systems Per HPI    Objective:   Physical Exam BP 141/81 mmHg  Pulse 85  Temp(Src) 98.1 F (36.7 C) (Oral)  Ht 5\' 10"  (1.778 m)  Wt 155 lb (70.308 kg)  BMI 22.24 kg/m2  LMP 09/28/2014 (Approximate) Gen: NAD. nontoxic in appearance, well-developed, well-nourished African-American female, pleasant  HEENT: AT. [place].Bilateral eyes without  injections or icterus. MMM. CV: RRR  Chest: CTAB, no wheeze or crackles Ext: No erythema.  noedema.  Skin: hypopigmented areas over arms, trunk and legs.  purpura or petechiae.     Assessment & Plan:  Christine [name] is a 44 y.o.  presented to family medicine clinic for follow-up: - Vitamin D deficiency: Will repeat vitamin D level today, although I would suspect it would still be low considering it doesn't sound as if she got 8 pills, she may have gotten a pills and took 2 pills a week. If vitamin D is still low patient will need another 8 weeks of medication, and then she will need follow-up with vitamin D and parathyroid hormone level.  - Diabetes with neuropathy: Patient was instructed to take 8 units of Lantus tonight, recheck her fasting blood sugars every morning if she has a blood sugar above 110, she is to increase that nights Lantus dose by one unit, she is to continue doing this until she sees blood sugars below 110. Whatever dose she ended on will be her new Lantus dose. Increased gabapentin to 900 mg 3 times a day today. Patient's creatinine has been normal. If she does not get good result with the higher dosage, I would consider adding nortriptyline to her regimen.  - Tinea versicolor: We'll try ketoconazole shampoo, patient was given instructions on proper use and not to overuse. Follow-up in 8 weeks to have vitamin D and parathyroid  hormone rechecked. Follow-up in 3 months on diabetes

## 2014-10-11 NOTE — Patient Instructions (Signed)
We will increase her gabapentin to 900 mg (see 3 tabs) 3 times a day. If this does not help with your neuropathy, we can consider another medication called nortriptyline. I will call you with the results of your vitamin D once they become available, if it is low I will call in another round of 1 capsule per week for 8 weeks. Sure you get 8 pills. Have also called in the ketoconazole shampoo for you to use daily for 7 days, make sure the latter this up good all over your body and leave on for 5 minutes and then rinse completely clean. Taper your Lantus dose as we discussed today, if your blood sugar fasting in the morning is above 110, then I want you to add 1 unit to that days Lantus dose. He needed do this until your fasting blood sugar is below 110, and what ever day she ended on will be her new Lantus dose.  Tinea Versicolor Tinea versicolor is a common yeast infection of the skin. This condition becomes known when the yeast on our skin starts to overgrow (yeast is a normal inhabitant on our skin). This condition is noticed as white or light brown patches on brown skin, and is more evident in the summer on tanned skin. These areas are slightly scaly if scratched. The light patches from the yeast become evident when the yeast creates "holes in your suntan". This is most often noticed in the summer. The patches are usually located on the chest, back, pubis, neck and body folds. However, it may occur on any area of body. Mild itching and inflammation (redness or soreness) may be present. DIAGNOSIS  The diagnosisof this is made clinically (by looking). Cultures from samples are usually not needed. Examination under the microscope may help. However, yeast is normally found on skin. The diagnosis still remains clinical. Examination under Wood's Ultraviolet Light can determine the extent of the infection. TREATMENT  This common infection is usually only of cosmetic (only a concern to your appearance). It is  easily treated with dandruff shampoo used during showers or bathing. Vigorous scrubbing will eliminate the yeast over several days time. The light areas in your skin may remain for weeks or months after the infection is cured unless your skin is exposed to sunlight. The lighter or darker spots caused by the fungus that remain after complete treatment are not a sign of treatment failure; it will take a long time to resolve. Your caregiver may recommend a number of commercial preparations or medication by mouth if home care is not working. Recurrence is common and preventative medication may be necessary. This skin condition is not highly contagious. Special care is not needed to protect close friends and family members. Normal hygiene is usually enough. Follow up is required only if you develop complications (such as a secondary infection from scratching), if recommended by your caregiver, or if no relief is obtained from the preparations used. Document Released: 04/12/2000 Document Revised: 07/08/2011 Document Reviewed: 05/25/2008 [facility] Patient Information 2015 [place], [place]. This information is not intended to replace advice given to you by your health care provider. Make sure you discuss any questions you have with your health care provider.

## 2014-10-11 NOTE — Assessment & Plan Note (Signed)
[name] is a 44 y.o.  presented to family medicine clinic for follow-up: - Vitamin D deficiency: Will repeat vitamin D level today, although I would suspect it would still be low considering it doesn't sound as if she got 8 pills, she may have gotten a pills and took 2 pills a week. If vitamin D is still low patient will need another 8 weeks of medication, and then she will need follow-up with vitamin D and parathyroid hormone level.  - Diabetes with neuropathy: Patient was instructed to take 8 units of Lantus tonight, recheck her fasting blood sugars every morning if she has a blood sugar above 110, she is to increase that nights Lantus dose by one unit, she is to continue doing this until she sees blood sugars below 110. Whatever dose she ended on will be her new Lantus dose. Increased gabapentin to 900 mg 3 times a day today. Patient's creatinine has been normal. If she does not get good result with the higher dosage, I would consider adding nortriptyline to her regimen.  - Tinea versicolor: We'll try ketoconazole shampoo, patient was given instructions on proper use and not to overuse. Follow-up in 8 weeks to have vitamin D and parathyroid hormone rechecked. Follow-up in 3 months on diabetes

## 2014-10-12 ENCOUNTER — Telehealth: Payer: Self-pay | Admitting: Family Medicine

## 2014-10-12 DIAGNOSIS — E559 Vitamin D deficiency, unspecified: Secondary | ICD-10-CM

## 2014-10-12 LAB — VITAMIN D 25 HYDROXY (VIT D DEFICIENCY, FRACTURES): VIT D 25 HYDROXY: 10 ng/mL — AB (ref 30–100)

## 2014-10-12 MED ORDER — VITAMIN D3 1.25 MG (50000 UT) PO CAPS: 50000.0000 [IU] | ORAL_CAPSULE | ORAL | Status: DC

## 2014-10-12 NOTE — Telephone Encounter (Signed)
Called pt to discuss her Vitamin D level, which is still low <10, but improvement from <4 6 weeks ago. Discussed another 8 weeks of supplement (this time with D3), discussed once a week dosage for 8 weeks and then retest at 10 weeks for vitamin D and PTH (which was high last check). If these have not normalized in 10 weeks she will need and endocrinologist referral.  Howard Pouch DO

## 2014-10-20 ENCOUNTER — Ambulatory Visit: Payer: Medicaid Other | Admitting: Family Medicine

## 2014-10-25 ENCOUNTER — Ambulatory Visit: Payer: Medicaid Other | Admitting: Family Medicine

## 2014-12-09 ENCOUNTER — Other Ambulatory Visit: Payer: Self-pay | Admitting: *Deleted

## 2014-12-12 MED ORDER — ACCU-CHEK FASTCLIX LANCETS MISC: Status: DC

## 2014-12-30 ENCOUNTER — Other Ambulatory Visit (HOSPITAL_COMMUNITY)
Admission: RE | Admit: 2014-12-30 | Discharge: 2014-12-30 | Disposition: A | Discharge status: Home / Self Care | Payer: Medicaid Other | Source: Ambulatory Visit | Attending: Family Medicine | Admitting: Family Medicine

## 2014-12-30 ENCOUNTER — Telehealth: Payer: Self-pay | Admitting: *Deleted

## 2014-12-30 ENCOUNTER — Ambulatory Visit (INDEPENDENT_AMBULATORY_CARE_PROVIDER_SITE_OTHER): Payer: Medicaid Other | Admitting: Family Medicine

## 2014-12-30 ENCOUNTER — Encounter: Payer: Self-pay | Admitting: Family Medicine

## 2014-12-30 VITALS — BP 136/77 | HR 79 | Temp 98.0°F | Ht 69.0 in | Wt 161.5 lb

## 2014-12-30 DIAGNOSIS — Z01419 Encounter for gynecological examination (general) (routine) without abnormal findings: Secondary | ICD-10-CM | POA: Diagnosis not present

## 2014-12-30 DIAGNOSIS — E1041 Type 1 diabetes mellitus with diabetic mononeuropathy: Secondary | ICD-10-CM | POA: Diagnosis not present

## 2014-12-30 DIAGNOSIS — Z113 Encounter for screening for infections with a predominantly sexual mode of transmission: Secondary | ICD-10-CM | POA: Insufficient documentation

## 2014-12-30 DIAGNOSIS — Z0000 Encounter for general adult medical examination without abnormal findings: Secondary | ICD-10-CM

## 2014-12-30 DIAGNOSIS — E1140 Type 2 diabetes mellitus with diabetic neuropathy, unspecified: Secondary | ICD-10-CM | POA: Diagnosis present

## 2014-12-30 DIAGNOSIS — Z23 Encounter for immunization: Secondary | ICD-10-CM

## 2014-12-30 DIAGNOSIS — Z1151 Encounter for screening for human papillomavirus (HPV): Secondary | ICD-10-CM | POA: Insufficient documentation

## 2014-12-30 DIAGNOSIS — M25562 Pain in left knee: Secondary | ICD-10-CM

## 2014-12-30 DIAGNOSIS — E1149 Type 2 diabetes mellitus with other diabetic neurological complication: Secondary | ICD-10-CM

## 2014-12-30 DIAGNOSIS — M25561 Pain in right knee: Secondary | ICD-10-CM | POA: Insufficient documentation

## 2014-12-30 LAB — POCT GLYCOSYLATED HEMOGLOBIN (HGB A1C): Hemoglobin A1C: 6.4

## 2014-12-30 MED ORDER — GLUCOSE BLOOD VI STRP: ORAL_STRIP | Status: DC

## 2014-12-30 MED ORDER — INSULIN GLARGINE 100 UNIT/ML ~~LOC~~ SOLN
8.0000 [IU] | Freq: Every morning | SUBCUTANEOUS | Status: DC
Start: 2014-12-30 — End: 2015-01-03

## 2014-12-30 NOTE — Progress Notes (Signed)
   Subjective:    Patient ID: [name], female    DOB: 11/04/1970, 44 y.o.   MRN: [id number]  HPI  CC: pap smear  # Pap smear:  Has not been done since around 2007  Would like GC/chlamydia testing as well but declines HIV/RPR  Still getting menstrual cycle ROS: no discharge  # Diabetes  Taking 7 units lantus currently  Fasting sugars usually 80-98, has had one low of 56 but has not had any symptomatic episodes  Does not follow with endocrine  # Bilateral knee pain  Both knees have hurt for at least 8 months  Onset was gradual  No trauma to the knees  Has been taking 2 advil at a time ROS: no numbness/tingling of LE  Review of Systems   See HPI for ROS.   Past medical history, surgical, family, and social history reviewed and updated in the EMR as appropriate. Objective:  BP 136/77 mmHg  Pulse 79  Temp(Src) 98 F (36.7 C) (Oral)  Ht 5\' 9"  (1.753 m)  Wt 161 lb 8 oz (73.256 kg)  BMI 23.84 kg/m2  LMP  (LMP Unknown) Vitals and nursing note reviewed  General: NAD CV: RRR, nl s1s2 no mrg. 2+ radial pulses Resp: CTAB nl effort GU: normal external genitalia, NAVM, mild amount of green-white discharge present in vaginal vault  Assessment & Plan:  Diabetes mellitus with neuropathy A1c 6.4, well controlled. On lantus 7 units currently + SSI. F/u 3 months  Bilateral knee pain Unable to fully examine this problem, recommended tylenol and will get bilateral standing x-ray and follow up in 4 weeks.  Health care maintenance Pap smear and pneumovax today.

## 2014-12-30 NOTE — Assessment & Plan Note (Signed)
Pap smear and pneumovax today.

## 2014-12-30 NOTE — Patient Instructions (Signed)
The address for the [facility] offices are:  Address: [address], [place], [place] [address] [phone]  Address: [address] [place], [place], [place] [address] [phone]

## 2014-12-30 NOTE — Telephone Encounter (Signed)
[facility] pharmacy called needing clarification on lantus directions. Please give them a call [phone]. Hassell Done, Tamika L

## 2014-12-30 NOTE — Assessment & Plan Note (Signed)
Unable to fully examine this problem, recommended tylenol and will get bilateral standing x-ray and follow up in 4 weeks.

## 2014-12-30 NOTE — Assessment & Plan Note (Addendum)
A1c 6.4, well controlled. On lantus 7 units currently + SSI. F/u 3 months

## 2015-01-03 MED ORDER — INSULIN GLARGINE 100 UNIT/ML ~~LOC~~ SOLN: 8.0000 [IU] | Freq: Every morning | SUBCUTANEOUS | Status: DC

## 2015-01-03 NOTE — Telephone Encounter (Signed)
Dosing clarified, I had just sent in a refill of an old order but that order stated both take 8 units and take 12 units. Called pharmacy and sent in refill stating take 8 units only. -Dr. Lamar Benes

## 2015-01-04 LAB — CYTOLOGY - PAP

## 2015-01-13 ENCOUNTER — Encounter: Payer: Self-pay | Admitting: Family Medicine

## 2015-01-17 ENCOUNTER — Ambulatory Visit (HOSPITAL_COMMUNITY)
Admission: RE | Admit: 2015-01-17 | Discharge: 2015-01-17 | Disposition: A | Discharge status: Home / Self Care | Payer: Medicaid Other | Source: Ambulatory Visit | Attending: Family Medicine | Admitting: Family Medicine

## 2015-01-17 DIAGNOSIS — M25561 Pain in right knee: Secondary | ICD-10-CM | POA: Diagnosis not present

## 2015-01-17 DIAGNOSIS — M25562 Pain in left knee: Secondary | ICD-10-CM | POA: Diagnosis not present

## 2015-01-17 DIAGNOSIS — M25569 Pain in unspecified knee: Secondary | ICD-10-CM | POA: Diagnosis present

## 2015-01-23 ENCOUNTER — Telehealth: Payer: Self-pay | Admitting: Family Medicine

## 2015-01-23 NOTE — Telephone Encounter (Signed)
Called and spoke with patient regarding normal knee x-rays, no evidence of arthritis. She is still having some pain but it does get better with advil. I advised her if still having trouble to come back to clinic in the next month and we will re-evaluate and possibly refer her to PT, sports medicine. Also can re-check vitamin D at that visit. -Dr. Lamar Benes

## 2015-04-10 ENCOUNTER — Other Ambulatory Visit: Payer: Self-pay | Admitting: *Deleted

## 2015-04-10 DIAGNOSIS — E1149 Type 2 diabetes mellitus with other diabetic neurological complication: Secondary | ICD-10-CM

## 2015-04-10 MED ORDER — GLUCOSE BLOOD VI STRP: ORAL_STRIP | Status: DC

## 2015-04-11 ENCOUNTER — Ambulatory Visit: Payer: Medicaid Other | Admitting: Family Medicine

## 2015-04-13 ENCOUNTER — Other Ambulatory Visit: Payer: Self-pay | Admitting: Family Medicine

## 2015-04-13 DIAGNOSIS — E1149 Type 2 diabetes mellitus with other diabetic neurological complication: Secondary | ICD-10-CM

## 2015-04-13 NOTE — Telephone Encounter (Signed)
Refill request for Lisinopril

## 2015-04-14 MED ORDER — LISINOPRIL 10 MG PO TABS: 10.0000 mg | ORAL_TABLET | Freq: Every day | ORAL | Status: DC

## 2015-04-17 ENCOUNTER — Ambulatory Visit: Payer: Medicaid Other | Admitting: Family Medicine

## 2015-04-17 NOTE — Telephone Encounter (Signed)
Needs refill on "test strips"

## 2015-04-17 NOTE — Addendum Note (Signed)
Addended by: Valerie Roys on: 04/17/2015 02:17 PM   Modules accepted: Orders

## 2015-04-20 MED ORDER — GLUCOSE BLOOD VI STRP: ORAL_STRIP | Status: DC

## 2015-05-02 ENCOUNTER — Other Ambulatory Visit: Payer: Self-pay | Admitting: Family Medicine

## 2015-05-02 NOTE — Telephone Encounter (Signed)
Will forward to MD to send in corrected strips. Jazmin Hartsell,CMA

## 2015-05-02 NOTE — Telephone Encounter (Signed)
Pt called and needs the Accu-Check Avia strips called in. We sent the wrong smart view in. jw

## 2015-05-03 MED ORDER — GLUCOSE BLOOD VI STRP: ORAL_STRIP | Status: DC

## 2015-05-03 NOTE — Telephone Encounter (Signed)
Sent in another rx for Aviva strips.

## 2015-05-08 ENCOUNTER — Ambulatory Visit: Payer: Medicaid Other | Admitting: Family Medicine

## 2015-05-10 ENCOUNTER — Ambulatory Visit (INDEPENDENT_AMBULATORY_CARE_PROVIDER_SITE_OTHER): Payer: Medicaid Other | Admitting: Family Medicine

## 2015-05-10 ENCOUNTER — Encounter: Payer: Self-pay | Admitting: Family Medicine

## 2015-05-10 VITALS — BP 142/86 | HR 88 | Temp 98.6°F | Ht 69.0 in | Wt 166.0 lb

## 2015-05-10 DIAGNOSIS — I10 Essential (primary) hypertension: Secondary | ICD-10-CM | POA: Diagnosis not present

## 2015-05-10 DIAGNOSIS — Z111 Encounter for screening for respiratory tuberculosis: Secondary | ICD-10-CM

## 2015-05-10 DIAGNOSIS — E1140 Type 2 diabetes mellitus with diabetic neuropathy, unspecified: Secondary | ICD-10-CM

## 2015-05-10 DIAGNOSIS — Z794 Long term (current) use of insulin: Secondary | ICD-10-CM | POA: Diagnosis not present

## 2015-05-10 LAB — POCT GLYCOSYLATED HEMOGLOBIN (HGB A1C): HEMOGLOBIN A1C: 7.5

## 2015-05-10 MED ORDER — GLUCOSE BLOOD VI STRP: ORAL_STRIP | Status: DC

## 2015-05-10 NOTE — Progress Notes (Signed)
Patient ID: Christine [name], female   DOB: 08-05-1970, 45 y.o.   MRN: [id number]   [facility] Aquilla Hacker, MD Phone: [phone]  Subjective:   # DMII - pt here for follow up.  - Hgb A1C is up to 7.5 from 6.4 at last visit.  - She says CBG's normally 120 - 130 and in the 200's postprandially.  - No vision changes.  - she has not had any hypoglycemic episodes.  - Doing well on her current medications.  - attributes the increase to holiday fare.   # HTN - BP 144/90 today.  - took her BP meds - Has been checking her BP at home and it is 145 / 90 normally.  - She says that she is compliant with her medications.  - She has not had any episodes of hypotension.  - No chest pain, SOB, LE edema, CP on exertion.  - Tolerating her medicine well.   # TB skin test - needs this done for work. She is working at a group home.  - no known exposure - No travel to endemic areas.   # Smoking  - wants to quit.  - Smokes 1/2 - 1ppd now.  - Has tried to quit in the past unsuccessfully.  - Will think about it.   All relevant systems were reviewed and were negative unless otherwise noted in the HPI  Past Medical History Reviewed problem list.  Medications- reviewed and updated Current Outpatient Prescriptions  Medication Sig Dispense Refill  . ACCU-CHEK FASTCLIX LANCETS MISC Check sugar 5 x daily 204 each 3  . Aspirin-Acetaminophen-Caffeine (GOODY HEADACHE PO) Take 1 Package by mouth 2 (two) times daily as needed (pain).    . Blood Glucose Monitoring Suppl (ACCU-CHEK ADVANTAGE DIABETES) kit Use as instructed 1 each 0  . Cholecalciferol (VITAMIN D3) 50000 UNITS CAPS Take 50,000 Units by mouth once a week. 8 capsule 0  . gabapentin (NEURONTIN) 300 MG capsule 900 mg TID 180 capsule 3  . glucose blood (ACCU-CHEK AVIVA) test strip Check sugar 6 x daily 200 each 3  . glucose blood test strip Check sugar 5x daily 200 each 5  . insulin glargine (LANTUS) 100 UNIT/ML  injection Inject 0.08 mLs (8 Units total) into the skin every morning. 10 mL 11  . Insulin Pen Needle 31G X 5 MM MISC BD Pen Needles- brand specific Inject insulin via insulin pen 6 x daily 200 each 6  . Itraconazole 200 MG TABS Take 200 mg by mouth daily. 5 tablet 0  . ketoconazole (NIZORAL) 2 % shampoo Apply once a day, leave on for 5 minutes and then wash off. Repeat this process for 7 days. 120 mL 0  . lisinopril (PRINIVIL,ZESTRIL) 10 MG tablet Take 1 tablet (10 mg total) by mouth daily. 90 tablet 3  . triamcinolone cream (KENALOG) 0.1 % Apply 1 application topically 2 (two) times daily. 30 g 2  . Vitamin D, Ergocalciferol, (DRISDOL) 50000 UNITS CAPS capsule Take 1 capsule (50,000 Units total) by mouth every 7 (seven) days. 8 capsule 0   No current facility-administered medications for this visit.   Chief complaint-noted No additions to family history Social history- patient is a cuurrent 1/2ppd smoker  Objective: BP 142/86 mmHg  Pulse 88  Temp(Src) 98.6 F (37 C) (Oral)  Ht '5\' 9"'  (1.753 m)  Wt 166 lb (75.297 kg)  BMI 24.50 kg/m2  SpO2 98%  LMP 04/30/2015 Gen: NAD, alert, cooperative with exam HEENT: NCAT,  EOMI, PERRL Neck: FROM, supple, no LAD CV: RRR, good S1/S2, no murmur Resp: CTABL, no wheezes, non-labored Abd: SNTND, BS present, no guarding or organomegaly Ext: No edema, warm, normal tone, moves UE/LE spontaneously Neuro: Alert and oriented, No gross deficits Skin: no rashes no lesions  Assessment/Plan:  Diabetes mellitus with neuropathy Increased Hgb A1C to 7.5 this visit. Tolerating insulin well. She is on a low dose of lantus.  - Increase Lantus to 10 units daily - Continue novolog as is.  - f/u with PCP in 3 months for repeat A1C   Essential hypertension, benign BP slightly above goal 144/90. This is consistent with home readings as well. No warning signs of CAD.  - Increase Lisinopril to 20m daily.  - If BP drops too much, decrease to 142monce / day.  -  f/u for BP check on Friday with Nurse's visit.  - Reevaluate in 3 months with PCP otherwise.    PPD - placed today. To be read on 1/13.  Smoking - she needs to quit and is considering quitting. To reevaluate with PCP at her follow up visit.

## 2015-05-10 NOTE — Patient Instructions (Signed)
Thanks for letting us take care of you.   Come back to get your PPD read in two days.   Also get your BP checked at that visit.   We will increase your blood pressure pill to two pills (20 mg) once daily. If your bp falls too low, then go to 1.5 pill daily.   Also increase your lantus to 10 units daily.   Follow up for Blood pressure and Diabetes in 3 months with Dr. Lamar Benes.   Thanks for letting us take care of you.    Sincerely,  Paula Compton, MD Family Medicine - PGY 2

## 2015-05-10 NOTE — Assessment & Plan Note (Signed)
Increased Hgb A1C to 7.5 this visit. Tolerating insulin well. She is on a low dose of lantus.  - Increase Lantus to 10 units daily - Continue novolog as is.  - f/u with PCP in 3 months for repeat A1C

## 2015-05-10 NOTE — Assessment & Plan Note (Signed)
BP slightly above goal 144/90. This is consistent with home readings as well. No warning signs of CAD.  - Increase Lisinopril to 20mg  daily.  - If BP drops too much, decrease to 15mg  once / day.  - f/u for BP check on Friday with Nurse's visit.  - Reevaluate in 3 months with PCP otherwise.

## 2015-05-12 ENCOUNTER — Ambulatory Visit (INDEPENDENT_AMBULATORY_CARE_PROVIDER_SITE_OTHER): Payer: Medicaid Other | Admitting: *Deleted

## 2015-05-12 ENCOUNTER — Encounter: Payer: Self-pay | Admitting: *Deleted

## 2015-05-12 VITALS — BP 130/80 | HR 88

## 2015-05-12 DIAGNOSIS — Z7689 Persons encountering health services in other specified circumstances: Secondary | ICD-10-CM

## 2015-05-12 DIAGNOSIS — Z111 Encounter for screening for respiratory tuberculosis: Secondary | ICD-10-CM

## 2015-05-12 DIAGNOSIS — Z0130 Encounter for examination of blood pressure without abnormal findings: Secondary | ICD-10-CM

## 2015-05-12 DIAGNOSIS — Z136 Encounter for screening for cardiovascular disorders: Secondary | ICD-10-CM

## 2015-05-12 DIAGNOSIS — I10 Essential (primary) hypertension: Secondary | ICD-10-CM

## 2015-05-12 LAB — TB SKIN TEST
Induration: 0 mm
TB Skin Test: NEGATIVE

## 2015-05-12 NOTE — Progress Notes (Signed)
   PPD Reading Note PPD read and results entered in [facility]. Result: 0 mm induration. Interpretation: Negative If test not read within 48-72 hours of initial placement, patient advised to repeat in other arm 1-3 weeks after this test. Allergic reaction: no  Patient in nurse clinic for blood pressure check.  BP 142/78 manually left arm, heart rate 88.  Repeat BP 130/80.  Patient stated she is taking her blood pressure medication as prescribed.  Patient is still smoking cigarettes.  Patient denies any chest pain, SOB, dizziness or headache.  Will forward to PCP.  Derl Barrow, RN Today's Vitals   05/12/15 1400 05/12/15 1410  BP: 142/78 130/80  Pulse: 88   SpO2: 98%   PainSc: 0-No pain

## 2015-05-15 ENCOUNTER — Other Ambulatory Visit: Payer: Self-pay | Admitting: Family Medicine

## 2015-11-08 ENCOUNTER — Other Ambulatory Visit: Payer: Self-pay | Admitting: Family Medicine

## 2015-11-08 NOTE — Telephone Encounter (Signed)
Pt called and would like a refill on her gabapentin called in. jw

## 2015-11-11 MED ORDER — GABAPENTIN 300 MG PO CAPS: 300.0000 mg | ORAL_CAPSULE | Freq: Three times a day (TID) | ORAL | Status: DC

## 2015-11-14 NOTE — Telephone Encounter (Signed)
LM for patient to call back and schedule an appt after she sees Development worker, community. Jazmin Hartsell,CMA

## 2015-11-30 ENCOUNTER — Ambulatory Visit: Payer: Self-pay

## 2015-12-04 ENCOUNTER — Ambulatory Visit: Payer: Medicaid Other | Admitting: Family Medicine

## 2016-02-09 ENCOUNTER — Ambulatory Visit: Payer: Medicaid Other

## 2016-03-04 ENCOUNTER — Ambulatory Visit: Payer: Self-pay | Attending: Internal Medicine

## 2016-03-15 ENCOUNTER — Ambulatory Visit (INDEPENDENT_AMBULATORY_CARE_PROVIDER_SITE_OTHER): Payer: Self-pay | Admitting: Family Medicine

## 2016-03-15 VITALS — BP 148/76 | Temp 98.0°F | Ht 69.0 in | Wt 164.4 lb

## 2016-03-15 DIAGNOSIS — E1140 Type 2 diabetes mellitus with diabetic neuropathy, unspecified: Secondary | ICD-10-CM

## 2016-03-15 DIAGNOSIS — Z794 Long term (current) use of insulin: Secondary | ICD-10-CM

## 2016-03-15 DIAGNOSIS — R21 Rash and other nonspecific skin eruption: Secondary | ICD-10-CM

## 2016-03-15 DIAGNOSIS — I10 Essential (primary) hypertension: Secondary | ICD-10-CM

## 2016-03-15 LAB — POCT GLYCOSYLATED HEMOGLOBIN (HGB A1C): Hemoglobin A1C: 6.2

## 2016-03-15 MED ORDER — LISINOPRIL-HYDROCHLOROTHIAZIDE 20-12.5 MG PO TABS: 1.0000 | ORAL_TABLET | Freq: Every day | ORAL | 3 refills | Status: DC

## 2016-03-15 MED ORDER — GABAPENTIN 300 MG PO CAPS: 300.0000 mg | ORAL_CAPSULE | Freq: Three times a day (TID) | ORAL | 0 refills | Status: DC

## 2016-03-15 MED ORDER — PERMETHRIN 5 % EX CREA: 1.0000 "application " | TOPICAL_CREAM | Freq: Once | CUTANEOUS | 0 refills | Status: AC

## 2016-03-15 MED ORDER — PERMETHRIN 5 % EX CREA: 1.0000 "application " | TOPICAL_CREAM | Freq: Once | CUTANEOUS | 0 refills | Status: DC

## 2016-03-15 NOTE — Assessment & Plan Note (Addendum)
  BP today initially 167/81, on recheck 148/76, not at goal of <140/90  Patient has been out of BP medications for several months  -Will restart medication today, prescription given for Lisinopril/HCTZ 20/12.5 -Follow up 1 month to assess blood pressure control

## 2016-03-15 NOTE — Assessment & Plan Note (Addendum)
  A1C today 6.2, well controlled without medication for the past 3 months  -will recheck A1C in 3 months and determine need for medications at that time -continue without meds currently -foot exam today wnl -gabapentin refilled for peripheral neuropathy

## 2016-03-15 NOTE — Progress Notes (Signed)
    Subjective:    Patient ID: [name], female    DOB: 14-Apr-1971, 45 y.o.   MRN: [id number]  Here for follow up and medication refills Has not had insurance for several months, ran out of medications.  HTN has not taken BP medications for at least 3 months Denies chest pain, shortness of breath, headache, vision changes Does not check BP at home Was told to take 20 mg lisinopril but never increased dose from 10 mg  DM Ran out of Lantus at least 3 months ago was taking 6 units every night Has still been checking sugars fasting and at lunch time Fasting run 200's, at lunch run 120's.   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 Uses lotion daily for dry skin   Smoking status reviewed- current every day smoker 1/2 ppd  Review of Systems   Objective:  BP (!) 148/76 (BP Location: Left Arm, Patient Position: Sitting, Cuff Size: Normal)   Temp 98 F (36.7 C) (Oral)   Ht 5\' 9"  (1.753 m)   Wt 164 lb 6.4 oz (74.6 kg)   SpO2 100%   BMI 24.28 kg/m  Vitals and nursing note reviewed  General: well nourished, in no acute distress Cardiac: RRR, clear S1 and S2, no murmurs, rubs, or gallops Respiratory: clear to auscultation bilaterally, no increased work of breathing Abdomen: soft, nontender, nondistended, no masses or organomegaly Extremities: no edema or cyanosis. Warm, well perfused. 2+ PT and DP pulses bilaterally Skin: dry skin noted over patients arms, leg, abdomen, and feet. Scattered darkened papules and excoriations noted over same areas.  Neuro: alert and oriented, no focal deficits   Assessment & Plan:    Diabetes mellitus with neuropathy  A1C today 6.2, well controlled without medication for the past 3 months  -will recheck A1C in 3 months and determine need for medications at that time -continue without meds  currently -foot exam today wnl -gabapentin refilled for peripheral neuropathy  Essential hypertension, benign  BP today initially 167/81, on recheck 148/76, not at goal of <140/90  Patient has been out of BP medications for several months  -Will restart medication today, prescription given for Lisinopril/HCTZ 20/12.5 -Follow up 1 month to assess blood pressure control  Rash and nonspecific skin eruption  Unclear etiology at this time. Suspect bug bites.  -rx given for permethin cream to apply once today and in 1 week -patient advised to take multivitamin -patient advised to use vaseline every night on dry skin -will reassess in 1 month, may need to biopsy to determine cause   Return in about 4 weeks (around 04/12/2016) for bp.   Lucila Maine, DO Family Medicine Resident PGY-1

## 2016-03-15 NOTE — Assessment & Plan Note (Signed)
  Unclear etiology at this time. Suspect bug bites.  -rx given for permethin cream to apply once today and in 1 week -patient advised to take multivitamin -patient advised to use vaseline every night on dry skin -will reassess in 1 month, may need to biopsy to determine cause

## 2016-03-15 NOTE — Patient Instructions (Signed)
  Please take your blood pressure medication every day and I will see you back in 1 month to see how you are doing.  Apply cream to whole body once before sleep, wash off in the morning. Repeat one week after.   Apply vaseline every night to dry skin Start taking a multivitamin  We will your A1C in 3 months.

## 2016-04-24 ENCOUNTER — Ambulatory Visit: Payer: No Typology Code available for payment source | Admitting: Family Medicine

## 2016-05-08 ENCOUNTER — Ambulatory Visit (INDEPENDENT_AMBULATORY_CARE_PROVIDER_SITE_OTHER): Payer: No Typology Code available for payment source | Admitting: Family Medicine

## 2016-05-08 ENCOUNTER — Encounter: Payer: Self-pay | Admitting: Family Medicine

## 2016-05-08 VITALS — BP 154/90 | HR 83 | Temp 98.0°F | Ht 69.0 in | Wt 162.8 lb

## 2016-05-08 DIAGNOSIS — E1140 Type 2 diabetes mellitus with diabetic neuropathy, unspecified: Secondary | ICD-10-CM

## 2016-05-08 DIAGNOSIS — Z794 Long term (current) use of insulin: Secondary | ICD-10-CM

## 2016-05-08 DIAGNOSIS — R21 Rash and other nonspecific skin eruption: Secondary | ICD-10-CM

## 2016-05-08 DIAGNOSIS — I10 Essential (primary) hypertension: Secondary | ICD-10-CM

## 2016-05-08 MED ORDER — LISINOPRIL-HYDROCHLOROTHIAZIDE 20-12.5 MG PO TABS: 1.0000 | ORAL_TABLET | Freq: Every day | ORAL | 11 refills | Status: DC

## 2016-05-08 MED ORDER — TRIAMCINOLONE ACETONIDE 0.1 % EX CREA: 1.0000 "application " | TOPICAL_CREAM | Freq: Two times a day (BID) | CUTANEOUS | 2 refills | Status: DC

## 2016-05-08 NOTE — Assessment & Plan Note (Addendum)
  Last A1C 6.2, not currently on medications  -follow up 1 month, check A1C at that visit

## 2016-05-08 NOTE — Patient Instructions (Addendum)
  It was good seeing you again! I'm sorry your rash is bothering you so much.   Please get Generic Zyrtec from [facility] or any Pharmacy. The generic version will be a lot cheaper than name brand Zyrtec but works just as well. You can take this every morning and it will help with itching.  I have prescribed a stronger cream to help with the itching.   Please pick up the new blood pressure medication from [facility]. It should only be 4 dollars. If you have issues, please call us so we can help!  I'd like to check your blood work one week after starting the new blood pressure medication.   Please come back tomorrow 1/11 between 10 and 11 am to discuss financial assistance with Kennyth Lose in our front office. Call us at [phone] to speak with Kennyth Lose if you have any questions.  Please follow up in clinic to have a punch biopsy done of your rash so we can figure out what is causing this. I have scheduled you Wednesday 1/17 at 3pm to get this done in our clinic.

## 2016-05-08 NOTE — Assessment & Plan Note (Addendum)
  BP today 154/80 on 10mg  Lisinopril  -new Rx for Lisinopril/HCTZ 20/12.5 sent to pharmacy, on 4 dollar list -check BMP in 1 week -follow up 1-2 months

## 2016-05-08 NOTE — Assessment & Plan Note (Signed)
Unclear etiology, most closely resembles bed bugs but partner without any rash.   -scheduled with derm clinic for punch biopsy next week -rx for kenalog cream given -recommended daily zyrtec to help with itching -follow up after bx

## 2016-05-08 NOTE — Progress Notes (Signed)
    Subjective:    Patient ID: Christine [name], female    DOB: 1970/05/07, 46 y.o.   MRN: [id number]   CC: rash all over, worsening.   Rash Was prescribed permethrin cream at last visit but was unable to get cream due to cost (80 dollars). Rash has worsened and spread more. The itching interferes with sleep, she is frustrated. Has tried cortisone cream and benadryl which provide some relief. No other members of house have any rash including her partner who sleeps in same bed. She does report discovering new bumps in morning but also during day at work. Mom had similar rash in past but it went away on its own. Occasionally the bumps will leak clear fluid. She does scratch at them. No changes in medicines, detergents, foods. No pets or contact with animals.   HTN Was unable to get new medication from last visit due to cost. Advised her to call if she has more issues with medications, she agreed. She is open to trying this again but is worried about cost. She has financial assistance but unsure if she has medication coverage. She has been taking 10 mg Lisinopril daily because that is what she had leftover. Almost out of these pills (about 4 left). Has not missed a dose. Denies chest pain, shortness of breath, vision changes, headaches. Does not check BP at home.   Smoking status reviewed- current every day smoker  Review of Systems- see HPI   Objective:  BP (!) 154/90   Pulse 83   Temp 98 F (36.7 C) (Oral)   Ht 5\' 9"  (1.753 m)   Wt 162 lb 12.8 oz (73.8 kg)   LMP 04/23/2016 (Exact Date)   SpO2 99%   BMI 24.04 kg/m  Vitals and nursing note reviewed  General: well nourished, in no acute distress Cardiac: RRR, clear S1 and S2, no murmurs, rubs, or gallops Respiratory: clear to auscultation bilaterally, no increased work of breathing Abdomen: soft, nontender, nondistended, no masses or organomegaly. Bowel sounds present Extremities: no edema or cyanosis. Warm, well perfused. Skin:  scattered papules over chest, abdomen, back, thighs, legs, feet with evidence of excoriation. Some linear patterns. No involvement of hands or web spaces. No face involvement. Various stages of healing. Neuro: alert and oriented, no focal deficits   Assessment & Plan:    Essential hypertension, benign  BP today 154/80 on 10mg  Lisinopril  -new Rx for Lisinopril/HCTZ 20/12.5 sent to pharmacy, on 4 dollar list -check BMP in 1 week -follow up 1-2 months  Diabetes mellitus with neuropathy  Last A1C 6.2, not currently on medications  -follow up 1 month, check A1C at that visit  Rash and nonspecific skin eruption Unclear etiology, most closely resembles bed bugs but partner without any rash.   -scheduled with derm clinic for punch biopsy next week -rx for kenalog cream given -recommended daily zyrtec to help with itching -follow up after bx    Lab visit 1/17 for BMP check Derm clinic 1/17 for punch bx of rash Return in about 4 weeks (around 06/05/2016).   Lucila Maine, DO Family Medicine Resident PGY-1

## 2016-05-10 ENCOUNTER — Telehealth: Payer: Self-pay | Admitting: Family Medicine

## 2016-05-10 NOTE — Telephone Encounter (Signed)
Spoke w/ Ms. Gilliand- she was able to pick up her new BP medicine as well as steroid cream. Was unable to come in to get orange card sorted out today but will call and schedule appointment w/ financial assistance once she has paperwork together. She will follow up w/ derm clinic and to get labs drawn next week.

## 2016-05-15 ENCOUNTER — Other Ambulatory Visit: Payer: No Typology Code available for payment source

## 2016-05-15 ENCOUNTER — Ambulatory Visit: Payer: No Typology Code available for payment source

## 2016-06-10 ENCOUNTER — Ambulatory Visit: Payer: Self-pay | Admitting: Family Medicine

## 2016-08-10 ENCOUNTER — Emergency Department (HOSPITAL_COMMUNITY)
Admission: EM | Admit: 2016-08-10 | Discharge: 2016-08-10 | Disposition: A | Discharge status: Home / Self Care | Payer: No Typology Code available for payment source | Attending: Emergency Medicine | Admitting: Emergency Medicine

## 2016-08-10 ENCOUNTER — Encounter (HOSPITAL_COMMUNITY): Payer: Self-pay | Admitting: Emergency Medicine

## 2016-08-10 DIAGNOSIS — E1140 Type 2 diabetes mellitus with diabetic neuropathy, unspecified: Secondary | ICD-10-CM | POA: Insufficient documentation

## 2016-08-10 DIAGNOSIS — Z794 Long term (current) use of insulin: Secondary | ICD-10-CM | POA: Insufficient documentation

## 2016-08-10 DIAGNOSIS — R21 Rash and other nonspecific skin eruption: Secondary | ICD-10-CM

## 2016-08-10 DIAGNOSIS — Z79899 Other long term (current) drug therapy: Secondary | ICD-10-CM | POA: Insufficient documentation

## 2016-08-10 DIAGNOSIS — F17210 Nicotine dependence, cigarettes, uncomplicated: Secondary | ICD-10-CM | POA: Insufficient documentation

## 2016-08-10 DIAGNOSIS — I10 Essential (primary) hypertension: Secondary | ICD-10-CM | POA: Insufficient documentation

## 2016-08-10 DIAGNOSIS — Z7982 Long term (current) use of aspirin: Secondary | ICD-10-CM | POA: Insufficient documentation

## 2016-08-10 LAB — CBG MONITORING, ED: Glucose-Capillary: 162 mg/dL — ABNORMAL HIGH (ref 65–99)

## 2016-08-10 MED ORDER — HYDROXYZINE HCL 25 MG PO TABS: 25.0000 mg | ORAL_TABLET | Freq: Three times a day (TID) | ORAL | 0 refills | Status: DC | PRN

## 2016-08-10 MED ORDER — HYDROXYZINE HCL 25 MG PO TABS
25.0000 mg | ORAL_TABLET | Freq: Once | ORAL | Status: AC
  Administered 2016-08-10: 25 mg via ORAL
  Filled 2016-08-10: qty 1

## 2016-08-10 NOTE — ED Triage Notes (Signed)
Patient reports that she has had rash on lower legs, back, abdomen, feet x 3 months. Itches. Dry.

## 2016-08-10 NOTE — ED Notes (Signed)
Dr. Wickline at bedside at this time.  

## 2016-08-10 NOTE — ED Provider Notes (Signed)
[facility] DEPT Provider Note   CSN: [id number] Arrival date & time: 08/10/16  0358     History   Chief Complaint Chief Complaint  Patient presents with  . Rash   Patient gave verbal permission to utilize photo for medical documentation only The image was not stored on any personal device  HPI Christine [name] is a 46 y.o. female.  The history is provided by the patient.  Rash   This is a chronic problem. The current episode started more than 1 week ago. The problem has been gradually worsening. The problem is associated with nothing. There has been no fever. The rash is present on the abdomen, left upper leg, right upper leg, right lower leg and left lower leg. The pain is moderate. The pain has been constant since onset. Associated symptoms include itching. She has tried antihistamines for the symptoms. The treatment provided no relief.    Past Medical History:  Diagnosis Date  . Alcohol abuse   . Chronic pancreatitis ([place])   . Diabetes mellitus   . Hypertension   . Substance abuse     Patient Active Problem List   Diagnosis Date Noted  . Bilateral knee pain 12/30/2014  . Tinea versicolor 08/31/2014  . Encounter for smoking cessation counseling 01/13/2014  . Health care maintenance 01/13/2014  . 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 Vitamin D deficiency 11/01/2013  . Lower extremity neuropathy 10/28/2013  . Rash and nonspecific skin eruption 10/28/2013  . Venous insufficiency 02/05/2013  . Essential hypertension, benign 12/25/2012  . Chronic alcoholic  pancreatitis 23/55/7322  . Protein-calorie malnutrition, severe ([place]) 11/18/2012  . Depression 09/29/2012  . Abdominal pain, unspecified site 07/18/2012  . Neuropathic pain 01/29/2011  . DRUG ABUSE, HX OF 07/05/2009  . ANEMIA 09/28/2008  . HYPERLIPIDEMIA 09/12/2008  . TOBACCO USER 09/12/2008  . Diabetes mellitus with neuropathy ([place]) 06/26/2006    History reviewed. No pertinent  surgical history.  OB History    No data available       Home Medications    Prior to Admission medications   Medication Sig Start Date End Date Taking? Authorizing Provider  ACCU-CHEK FASTCLIX LANCETS MISC Check sugar 5 x daily 12/12/14   Leone Brand, MD  Aspirin-Acetaminophen-Caffeine (GOODY HEADACHE PO) Take 1 Package by mouth 2 (two) times daily as needed (pain).    Historical Provider, MD  Blood Glucose Monitoring Suppl (ACCU-CHEK ADVANTAGE DIABETES) kit Use as instructed 10/28/13   Renee A Kuneff, DO  Cholecalciferol (VITAMIN D3) 50000 UNITS CAPS Take 50,000 Units by mouth once a week. 10/12/14   Renee A Kuneff, DO  gabapentin (NEURONTIN) 300 MG capsule Take 1 capsule (300 mg total) by mouth 3 (three) times daily. 03/15/16   Steve Rattler, DO  gabapentin (NEURONTIN) 300 MG capsule Take 1 capsule (300 mg total) by mouth 3 (three) times daily. 03/15/16   Steve Rattler, DO  glucose blood (ACCU-CHEK AVIVA) test strip Check sugar 6 x daily 05/10/15   Aquilla Hacker, MD  glucose blood test strip Check sugar 5x daily 05/03/15   Leone Brand, MD  hydrOXYzine (ATARAX/VISTARIL) 25 MG tablet Take 1 tablet (25 mg total) by mouth every 8 (eight) hours as needed. 08/10/16   Ripley Fraise, MD  insulin glargine (LANTUS) 100 UNIT/ML injection Inject 0.08 mLs (8 Units total) into the skin every morning. 01/03/15   Leone Brand, MD  Insulin Pen Needle 31G X 5 MM MISC BD Pen Needles- brand specific Inject insulin  via insulin pen 6 x daily 10/28/13   Renee A Kuneff, DO  Itraconazole 200 MG TABS Take 200 mg by mouth daily. 08/31/14   Renee A Kuneff, DO  ketoconazole (NIZORAL) 2 % shampoo Apply once a day, leave on for 5 minutes and then wash off. Repeat this process for 7 days. 10/11/14   Renee A Kuneff, DO  lisinopril-hydrochlorothiazide (ZESTORETIC) 20-12.5 MG tablet Take 1 tablet by mouth daily. 05/08/16   Steve Rattler, DO  lisinopril-hydrochlorothiazide (ZESTORETIC) 20-12.5 MG tablet Take 1 tablet by  mouth daily. 05/08/16   Steve Rattler, DO  triamcinolone cream (KENALOG) 0.1 % Apply 1 application topically 2 (two) times daily. 05/08/16   Steve Rattler, DO  Vitamin D, Ergocalciferol, (DRISDOL) 50000 UNITS CAPS capsule Take 1 capsule (50,000 Units total) by mouth every 7 (seven) days. 09/02/14   Renee A Raoul Pitch, DO    Family History No family history on file.  Social History Social History  Substance Use Topics  . Smoking status: Current Every Day Smoker    Packs/day: 0.50    Years: 20.00    Types: Cigarettes  . Smokeless tobacco: Never Used     Comment: working on quitting  . Alcohol use 0.0 oz/week     Comment: stopped drinking beers a month ago     Allergies   Patient has no known allergies.   Review of Systems Review of Systems  Constitutional: Negative for fever.  HENT: Negative for facial swelling.   Respiratory: Negative for shortness of breath.   Skin: Positive for itching and rash.     Physical Exam Updated Vital Signs BP 136/86 (BP Location: Right Arm)   Pulse 86   Temp 98.6 F (37 C) (Oral)   Resp (!) 22   Ht _0  (1.753 m)   Wt 70.8 kg   LMP 08/09/2016   SpO2 99%   BMI 23.04 kg/m   Physical Exam CONSTITUTIONAL: Well developed/well nourished HEAD: Normocephalic/atraumatic Eyes - no conjunctival lesions noted ENMT: Mucous membranes moist, no oral lesions noted.  No angioedema NECK: supple no meningeal signs CV: S1/S2 noted, no murmurs/rubs/gallops noted LUNGS: Lungs are clear to auscultation bilaterally, no apparent distress ABDOMEN: soft, nontender NEURO: Pt is awake/alert/appropriate, moves all extremitiesx4.  EXTREMITIES: pulses normal/equal, full ROM SKIN: warm, color normal, rash noted, see photo, no rash to palms PSYCH: no abnormalities of mood noted, alert and oriented to situation     ED Treatments / Results  Labs (all labs ordered are listed, but only abnormal results are displayed) Labs Reviewed  CBG MONITORING, ED -  Abnormal; Notable for the following:       Result Value   Glucose-Capillary 162 (*)    All other components within normal limits    EKG  EKG Interpretation None       Radiology No results found.  Procedures Procedures (including critical care time)  Medications Ordered in ED Medications  hydrOXYzine (ATARAX/VISTARIL) tablet 25 mg (not administered)     Initial Impression / Assessment and Plan / ED Course  I have reviewed the triage vital signs and the nursing notes.   Pt with rash for months, reports she couldn't sleep tonight Per previous PCP note earlier this year, she has already been given referral to dermatology She will need to f/u with derm Otherwise, she is nontoxic/well appearing/stable for d/c home   Final Clinical Impressions(s) / ED Diagnoses   Final diagnoses:  Rash    New Prescriptions New Prescriptions  HYDROXYZINE (ATARAX/VISTARIL) 25 MG TABLET    Take 1 tablet (25 mg total) by mouth every 8 (eight) hours as needed.     Ripley Fraise, MD 08/10/16 [phone]

## 2016-08-11 ENCOUNTER — Encounter (HOSPITAL_COMMUNITY): Payer: Self-pay | Admitting: Emergency Medicine

## 2016-08-11 ENCOUNTER — Emergency Department (HOSPITAL_COMMUNITY)
Admission: EM | Admit: 2016-08-11 | Discharge: 2016-08-11 | Disposition: A | Discharge status: Home / Self Care | Payer: No Typology Code available for payment source | Attending: Emergency Medicine | Admitting: Emergency Medicine

## 2016-08-11 DIAGNOSIS — Y9389 Activity, other specified: Secondary | ICD-10-CM | POA: Insufficient documentation

## 2016-08-11 DIAGNOSIS — Z7982 Long term (current) use of aspirin: Secondary | ICD-10-CM | POA: Insufficient documentation

## 2016-08-11 DIAGNOSIS — Z794 Long term (current) use of insulin: Secondary | ICD-10-CM | POA: Insufficient documentation

## 2016-08-11 DIAGNOSIS — Y999 Unspecified external cause status: Secondary | ICD-10-CM | POA: Insufficient documentation

## 2016-08-11 DIAGNOSIS — M545 Low back pain, unspecified: Secondary | ICD-10-CM

## 2016-08-11 DIAGNOSIS — F17210 Nicotine dependence, cigarettes, uncomplicated: Secondary | ICD-10-CM | POA: Insufficient documentation

## 2016-08-11 DIAGNOSIS — M25512 Pain in left shoulder: Secondary | ICD-10-CM | POA: Insufficient documentation

## 2016-08-11 DIAGNOSIS — I10 Essential (primary) hypertension: Secondary | ICD-10-CM | POA: Insufficient documentation

## 2016-08-11 DIAGNOSIS — Z79899 Other long term (current) drug therapy: Secondary | ICD-10-CM | POA: Insufficient documentation

## 2016-08-11 DIAGNOSIS — Y92410 Unspecified street and highway as the place of occurrence of the external cause: Secondary | ICD-10-CM | POA: Insufficient documentation

## 2016-08-11 DIAGNOSIS — E1140 Type 2 diabetes mellitus with diabetic neuropathy, unspecified: Secondary | ICD-10-CM | POA: Insufficient documentation

## 2016-08-11 MED ORDER — ACETAMINOPHEN 325 MG PO TABS
650.0000 mg | ORAL_TABLET | Freq: Once | ORAL | Status: AC
  Administered 2016-08-11: 650 mg via ORAL
  Filled 2016-08-11: qty 2

## 2016-08-11 MED ORDER — ACETAMINOPHEN 500 MG PO TABS: 500.0000 mg | ORAL_TABLET | Freq: Four times a day (QID) | ORAL | 0 refills | Status: DC | PRN

## 2016-08-11 MED ORDER — METHOCARBAMOL 500 MG PO TABS: 500.0000 mg | ORAL_TABLET | Freq: Two times a day (BID) | ORAL | 0 refills | Status: DC | PRN

## 2016-08-11 NOTE — ED Notes (Signed)
Ice pack given to pt for shoulder
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
## 2016-08-15 ENCOUNTER — Emergency Department (HOSPITAL_COMMUNITY)
Admission: EM | Admit: 2016-08-15 | Discharge: 2016-08-15 | Disposition: A | Discharge status: Home / Self Care | Payer: No Typology Code available for payment source | Attending: Emergency Medicine | Admitting: Emergency Medicine

## 2016-08-15 ENCOUNTER — Encounter (HOSPITAL_COMMUNITY): Payer: Self-pay | Admitting: Emergency Medicine

## 2016-08-15 DIAGNOSIS — Z7982 Long term (current) use of aspirin: Secondary | ICD-10-CM | POA: Insufficient documentation

## 2016-08-15 DIAGNOSIS — F17210 Nicotine dependence, cigarettes, uncomplicated: Secondary | ICD-10-CM | POA: Insufficient documentation

## 2016-08-15 DIAGNOSIS — Z794 Long term (current) use of insulin: Secondary | ICD-10-CM | POA: Insufficient documentation

## 2016-08-15 DIAGNOSIS — Z79899 Other long term (current) drug therapy: Secondary | ICD-10-CM | POA: Insufficient documentation

## 2016-08-15 DIAGNOSIS — I10 Essential (primary) hypertension: Secondary | ICD-10-CM | POA: Insufficient documentation

## 2016-08-15 DIAGNOSIS — M545 Low back pain, unspecified: Secondary | ICD-10-CM

## 2016-08-15 DIAGNOSIS — E119 Type 2 diabetes mellitus without complications: Secondary | ICD-10-CM | POA: Insufficient documentation

## 2016-08-15 NOTE — Discharge Instructions (Signed)
Continue taking her medications as prescribed as needed for pain relief. I also recommend continuing to apply ice to affected area for 15-20 minutes 3-4 times daily. Refrain from doing any heavy lifting or repetitive movements exacerbate her symptoms for the next week. Please follow up with a primary care provider from the Resource Guide provided below in 1 week as needed. Please return to the Emergency Department if symptoms worsen or new onset of fever, numbness, tingling, groin anesthesia, loss of bowel or bladder, weakness.
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
## 2016-08-22 ENCOUNTER — Encounter (HOSPITAL_COMMUNITY): Payer: Self-pay

## 2016-08-22 ENCOUNTER — Emergency Department (HOSPITAL_COMMUNITY)
Admission: EM | Admit: 2016-08-22 | Discharge: 2016-08-22 | Disposition: A | Discharge status: Home / Self Care | Payer: No Typology Code available for payment source | Attending: Emergency Medicine | Admitting: Emergency Medicine

## 2016-08-22 DIAGNOSIS — I10 Essential (primary) hypertension: Secondary | ICD-10-CM | POA: Insufficient documentation

## 2016-08-22 DIAGNOSIS — Z794 Long term (current) use of insulin: Secondary | ICD-10-CM | POA: Insufficient documentation

## 2016-08-22 DIAGNOSIS — Z7982 Long term (current) use of aspirin: Secondary | ICD-10-CM | POA: Insufficient documentation

## 2016-08-22 DIAGNOSIS — F17210 Nicotine dependence, cigarettes, uncomplicated: Secondary | ICD-10-CM | POA: Insufficient documentation

## 2016-08-22 DIAGNOSIS — E1140 Type 2 diabetes mellitus with diabetic neuropathy, unspecified: Secondary | ICD-10-CM | POA: Insufficient documentation

## 2016-08-22 DIAGNOSIS — Z0279 Encounter for issue of other medical certificate: Secondary | ICD-10-CM | POA: Insufficient documentation

## 2016-08-22 DIAGNOSIS — Z09 Encounter for follow-up examination after completed treatment for conditions other than malignant neoplasm: Secondary | ICD-10-CM
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
## 2016-08-22 NOTE — Discharge Instructions (Signed)
You have stated you are symptom-free and feel ready to return to work. Your return to work note is continued upon this information being accurate. Please refrain from heavy lifting, twisting, or other strenuous activity should pain recur.

## 2016-08-22 NOTE — ED Notes (Signed)
Patient Alert and oriented X4. Stable and ambulatory. Patient verbalized understanding of the discharge instructions.  Patient belongings were taken by the patient.  
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
## 2016-10-29 ENCOUNTER — Ambulatory Visit: Payer: No Typology Code available for payment source | Admitting: Family Medicine

## 2016-11-05 ENCOUNTER — Encounter: Payer: Self-pay | Admitting: Family Medicine

## 2016-11-05 ENCOUNTER — Ambulatory Visit (INDEPENDENT_AMBULATORY_CARE_PROVIDER_SITE_OTHER): Payer: Self-pay | Admitting: Family Medicine

## 2016-11-05 VITALS — BP 162/78 | HR 90 | Temp 99.1°F | Ht 69.0 in | Wt 150.8 lb

## 2016-11-05 DIAGNOSIS — I10 Essential (primary) hypertension: Secondary | ICD-10-CM

## 2016-11-05 DIAGNOSIS — Z794 Long term (current) use of insulin: Secondary | ICD-10-CM

## 2016-11-05 DIAGNOSIS — R21 Rash and other nonspecific skin eruption: Secondary | ICD-10-CM

## 2016-11-05 DIAGNOSIS — E1140 Type 2 diabetes mellitus with diabetic neuropathy, unspecified: Secondary | ICD-10-CM

## 2016-11-05 LAB — POCT GLYCOSYLATED HEMOGLOBIN (HGB A1C): HEMOGLOBIN A1C: 6

## 2016-11-05 MED ORDER — LISINOPRIL-HYDROCHLOROTHIAZIDE 20-12.5 MG PO TABS: 1.0000 | ORAL_TABLET | Freq: Every day | ORAL | 11 refills | Status: DC

## 2016-11-05 MED ORDER — SULFAMETHOXAZOLE-TRIMETHOPRIM 400-80 MG PO TABS: 1.0000 | ORAL_TABLET | Freq: Two times a day (BID) | ORAL | 0 refills | Status: AC

## 2016-11-05 MED ORDER — LISINOPRIL-HYDROCHLOROTHIAZIDE 20-12.5 MG PO TABS
1.0000 | ORAL_TABLET | Freq: Every day | ORAL | 11 refills | Status: DC
Start: 2016-11-05 — End: 2016-11-05

## 2016-11-05 NOTE — Assessment & Plan Note (Signed)
  Rash has significantly worsened and spread since I last saw her, she has never seen Derm or used any topical medications I gave her. Concern for HIV due to systemic nature of rash, hx of drug abuse. Concern for cellulitis on LE as this area is draining and painful.  -rx given for 7 day course of Keflex (on 4 dollar list at [facility], patient states she can afford. Instructed to start this medication tonight) -appt made for derm clinic tomorrow afternoon for biopsy of rash -will obtain CBC, HIV, RPR today -return precautions given

## 2016-11-05 NOTE — Assessment & Plan Note (Signed)
  Well controlled, no medication. A1C today 6  -continue to monitor A1C intermittently -treat neuropathy with gabapentin if needed -follow up 6 months

## 2016-11-05 NOTE — Assessment & Plan Note (Signed)
  Uncontrolled, patient unable to afford medication due to lack of insurance and financial hardship  -restarted lisinopril-hctz today- on 4 dollar list at [facility]. Rx sent there, patient states she can afford and will begin taking today -follow up tomorrow at derm clinic for biopsy, check BP at this time -will follow up 2-4 weeks after this

## 2016-11-05 NOTE — Patient Instructions (Signed)
   I'm sorry your rash has worsened.  Please start taking Bactrim (antibiotic) for the rash, if you develop rash or swelling please stop this medication.  Start taking the blood pressure medicine today.  We'll see you tomorrow for skin biopsy.  If you have questions or concerns please do not hesitate to call at [phone].  Lucila Maine, DO PGY-2, [facility] 11/05/2016 4:02 PM

## 2016-11-05 NOTE — Progress Notes (Signed)
    Subjective:    Patient ID: Christine [name], female    DOB: 1971-04-07, 46 y.o.   MRN: [id number]   CC: follow up DM, HTN. Concern about rash  DM- not on any medications, she denies any increased thirst or urination.   HTN- not taking any blood pressure medication as she lost financial assistance and cannot afford medicines. Has not been in to be seen in 5 months. Denies CP and SOB.   Rash- has worsened significantly. Bumps have spread and joined together, now with oozing and pain in addition to severe itching. She is very miserable over this. Taking benadryl with no relief of itching. Has been to ED twice with no results. Denies chills, reports her legs feel hot. Itching worse over lower back and buttocks. Has some weight loss. Denies night sweats. No one she lives with has similar rash. She was told rash resembles psoriasis, no FH of psoriasis. Heat does appear to make the rash worse. Nothing makes it better.  Smoking status reviewed- current every day smoker  Review of Systems- see HPI   Objective:  BP (!) 162/78   Pulse 90   Temp 99.1 F (37.3 C) (Oral)   Ht 5\' 9"  (1.753 m)   Wt 150 lb 12.8 oz (68.4 kg)   SpO2 99%   BMI 22.27 kg/m  Vitals and nursing note reviewed  General: well nourished, in no acute distress Cardiac: RRR, clear S1 and S2, no murmurs, rubs, or gallops Respiratory: clear to auscultation bilaterally, no increased work of breathing Extremities: no edema or cyanosis Skin: warm and dry, no rashes noted Neuro: alert and oriented, no focal deficits   Assessment & Plan:    Diabetes mellitus with neuropathy  Well controlled, no medication. A1C today 6  -continue to monitor A1C intermittently -treat neuropathy with gabapentin if needed -follow up 6 months  Essential hypertension, benign  Uncontrolled, patient unable to afford medication due to lack of insurance and financial hardship  -restarted lisinopril-hctz today- on 4 dollar list at [facility]. Rx  sent there, patient states she can afford and will begin taking today -follow up tomorrow at derm clinic for biopsy, check BP at this time -will follow up 2-4 weeks after this  Rash and nonspecific skin eruption  Rash has significantly worsened and spread since I last saw her, she has never seen Derm or used any topical medications I gave her. Concern for HIV due to systemic nature of rash, hx of drug abuse. Concern for cellulitis on LE as this area is draining and painful.  -rx given for 7 day course of Keflex (on 4 dollar list at [facility], patient states she can afford. Instructed to start this medication tonight) -appt made for derm clinic tomorrow afternoon for biopsy of rash -will obtain CBC, HIV, RPR today -return precautions given   Return 7/11 for derm clinic 2-4 weeks for BP check   Lucila Maine, DO Family Medicine Resident PGY-2

## 2016-11-06 ENCOUNTER — Ambulatory Visit: Payer: No Typology Code available for payment source

## 2016-11-06 LAB — CBC
HEMATOCRIT: 33.9 % — AB (ref 34.0–46.6)
Hemoglobin: 10.8 g/dL — ABNORMAL LOW (ref 11.1–15.9)
MCH: 29.8 pg (ref 26.6–33.0)
MCHC: 31.9 g/dL (ref 31.5–35.7)
MCV: 93 fL (ref 79–97)
Platelets: 193 10*3/uL (ref 150–379)
RBC: 3.63 x10E6/uL — ABNORMAL LOW (ref 3.77–5.28)
RDW: 15.6 % — AB (ref 12.3–15.4)
WBC: 8.2 10*3/uL (ref 3.4–10.8)

## 2016-11-06 LAB — RPR: RPR Ser Ql: NONREACTIVE

## 2016-11-06 LAB — HIV ANTIBODY (ROUTINE TESTING W REFLEX): HIV SCREEN 4TH GENERATION: NONREACTIVE

## 2016-11-08 ENCOUNTER — Ambulatory Visit (INDEPENDENT_AMBULATORY_CARE_PROVIDER_SITE_OTHER): Payer: No Typology Code available for payment source | Admitting: Family Medicine

## 2016-11-08 ENCOUNTER — Encounter: Payer: Self-pay | Admitting: Family Medicine

## 2016-11-08 VITALS — BP 96/60 | HR 91 | Temp 98.3°F | Wt 149.0 lb

## 2016-11-08 DIAGNOSIS — R21 Rash and other nonspecific skin eruption: Secondary | ICD-10-CM

## 2016-11-08 DIAGNOSIS — I10 Essential (primary) hypertension: Secondary | ICD-10-CM

## 2016-11-08 MED ORDER — LISINOPRIL-HYDROCHLOROTHIAZIDE 20-12.5 MG PO TABS
1.0000 | ORAL_TABLET | Freq: Every day | ORAL | 11 refills | Status: DC
Start: 2016-11-08 — End: 2018-12-16

## 2016-11-08 NOTE — Assessment & Plan Note (Signed)
  Uncontrolled, BP soft today and same upon recheck  -rx for zestoretic sent to [facility] -pt instructed to call if has problems getting this -will follow up with pt next week regarding skin biopsy results and BP recheck

## 2016-11-08 NOTE — Progress Notes (Signed)
    Subjective:    Patient ID: [name], female    DOB: 1970/06/05, 46 y.o.   MRN: [id number]   CC: here for skin biopsies  HPI: has started bactrim with minor improvement. Pruritis remains.   Did not get blood pressure medication as the cost was 15 dollars at [facility].  Smoking status reviewed- current smoker  Review of Systems- no fever, chills, CP, SOB.  Objective:  BP 96/60   Pulse 91   Temp 98.3 F (36.8 C) (Oral)   Wt 149 lb (67.6 kg)   LMP 10/21/2016   SpO2 98%   BMI 22.00 kg/m  Vitals and nursing note reviewed  General: well nourished, in no acute distress Skin: darkened skin lesions with evidence of excoriations over back, arms, stomach, chest, legs. Spares palms and soles. LE rash with evidence of weeping and purulent drainage.   After informed written consent was obtained, using Betadine for cleansing and 1% Lidocaine with epinephrine for anesthetic, with sterile technique a 3 (x2) and 4 (x1) mm punch biopsy was used to obtain a biopsy specimen of the lesion. Hemostasis was obtained by pressure and silver nitrate and wound on left lower extremity was sutured. Antibiotic dressing is applied, and wound care instructions provided. Be alert for any signs of cutaneous infection. The specimen is labeled and sent to pathology for evaluation. The procedure was well tolerated without complications.  Assessment & Plan:    Essential hypertension, benign  Uncontrolled, BP soft today and same upon recheck  -rx for zestoretic [facility] to [facility] -pt instructed to call if has problems getting this -will follow up with pt next week regarding skin biopsy results and BP recheck  Rash and nonspecific skin eruption  Rash biopsied today 2 spots on LLE and 1 spot on RUE  -follow up path results     No Follow-up on file.   Lucila Maine, DO Family Medicine Resident PGY-2

## 2016-11-08 NOTE — Patient Instructions (Addendum)
  It was good seeing you!  Please get the blood pressure medicine from [facility].   I'll call you once I get the biopsy results back.  If you have questions or concerns please do not hesitate to call at [phone].  Lucila Maine, DO PGY-2, [facility] 11/08/2016 10:59 AM

## 2016-11-08 NOTE — Assessment & Plan Note (Signed)
  Rash biopsied today 2 spots on LLE and 1 spot on RUE  -follow up path results

## 2016-11-13 ENCOUNTER — Telehealth: Payer: Self-pay | Admitting: Family Medicine

## 2016-11-13 NOTE — Telephone Encounter (Signed)
Will forward to MD to see if results are back.  Jazmin Hartsell,CMA

## 2016-11-13 NOTE — Telephone Encounter (Signed)
The results are not back yet. I will call her when I receive them. Thank you!

## 2016-11-13 NOTE — Telephone Encounter (Signed)
Pt would like her biospy results from last Thursday

## 2016-11-13 NOTE — Telephone Encounter (Signed)
Pt contacted and informed of results not being back yet. Pt verbalized understanding. Pt stated she is experiencing some bilateral leg/ankle swelling. Pt stated she mentioned this to you at her bx apt this past Friday, pt stated you gave her a medication to help with this. However, pt states the medication did not work and she is still experiencing swelling. The pt was very hard to follow.... Please advise.

## 2016-11-14 NOTE — Telephone Encounter (Signed)
I'm not sure what she means by this. Could she come in for an appointment tomorrow morning (open appt at 11:25)? No biopsy results yet but I'd like to see her back and see how she is doing with rash, BP, and LE swelling. Thank you!

## 2016-11-14 NOTE — Telephone Encounter (Signed)
Attempted to contact pt to schedule SDA for PCP tomm. However, the call could not be completed as dialed, no other numbers available. If pt calls back for her bx results, please schedule her in [place] 11:25am spot for 7/20. Thanks!

## 2016-11-14 NOTE — Telephone Encounter (Signed)
LM with sister for patient to call back and make an appointment. Jazmin Hartsell,CMA

## 2016-11-15 ENCOUNTER — Other Ambulatory Visit: Payer: Self-pay | Admitting: Family Medicine

## 2016-11-15 MED ORDER — PREDNISONE 20 MG PO TABS
40.0000 mg | ORAL_TABLET | Freq: Every day | ORAL | 1 refills | Status: DC
Start: 2016-11-15 — End: 2016-12-11

## 2016-11-15 NOTE — Progress Notes (Signed)
  Sent in Rx for 2 week course of prednisone, 40 mg daily. Tried to call patient to relay biopsy results but could not reach her. If she calls please ask her to pick up this medication from her pharmacy. Thanks.  Lucila Maine, DO PGY-2, [facility] 11/15/2016 10:42 AM

## 2016-11-19 ENCOUNTER — Ambulatory Visit: Payer: No Typology Code available for payment source | Admitting: Family Medicine

## 2016-11-19 DIAGNOSIS — L259 Unspecified contact dermatitis, unspecified cause: Secondary | ICD-10-CM | POA: Insufficient documentation

## 2016-11-19 NOTE — Progress Notes (Unsigned)
    Subjective:    Patient ID: Christine [name], female    DOB: 1971-03-08, 46 y.o.   MRN: [id number]   CC: follow up biopsy results  HPI:   Smoking status reviewed  Review of Systems   Objective:  LMP 10/21/2016  Vitals and nursing note reviewed  General: well nourished, in no acute distress Cardiac: RRR, clear S1 and S2, no murmurs, rubs, or gallops Respiratory: clear to auscultation bilaterally, no increased work of breathing Skin: warm and dry, no rashes noted Neuro: alert and oriented, no focal deficits  Assessment & Plan:    No problem-specific Assessment & Plan notes found for this encounter.    No Follow-up on file.   Lucila Maine, DO Family Medicine Resident PGY-2

## 2016-11-27 DIAGNOSIS — L0390 Cellulitis, unspecified: Secondary | ICD-10-CM

## 2016-11-27 HISTORY — DX: Cellulitis, unspecified: L03.90

## 2016-11-28 ENCOUNTER — Telehealth: Payer: Self-pay | Admitting: Family Medicine

## 2016-11-28 NOTE — Telephone Encounter (Signed)
  Called Ms. Rensch to see how her rash is doing on the steroid. She reports some improvement but sores on legs persistently draining. Denies fevers or chills. Scheduled her to come in tomorrow afternoon to assess legs.  Lucila Maine, DO PGY-2, [facility] 11/28/2016 3:22 PM

## 2016-11-29 ENCOUNTER — Ambulatory Visit (INDEPENDENT_AMBULATORY_CARE_PROVIDER_SITE_OTHER): Payer: No Typology Code available for payment source | Admitting: Family Medicine

## 2016-11-29 ENCOUNTER — Encounter: Payer: Self-pay | Admitting: Family Medicine

## 2016-11-29 VITALS — BP 138/78 | HR 98 | Temp 98.2°F | Wt 156.0 lb

## 2016-11-29 DIAGNOSIS — R21 Rash and other nonspecific skin eruption: Secondary | ICD-10-CM

## 2016-11-29 DIAGNOSIS — R634 Abnormal weight loss: Secondary | ICD-10-CM

## 2016-11-29 NOTE — Assessment & Plan Note (Addendum)
  Etiology of rash unclear, path read as chronic irritation dermatitis but no clear source. Differential includes dermatitis herpetiformis or porphyria versus allergy to some food or medication she may be taking. Poor wound healing may be secondary to immunocompromised state or malnutrition.  -continue 40 mg prednisone daily for additional 2 weeks, follow up at 2 weeks to start taper -will check TSH, ANA, ESR, and CMP today -follow up with patient via phone regarding labs -continue to keep wounds clean with soap and water, dry well, apply bacitracin ointment and bandage to open wounds

## 2016-11-29 NOTE — Patient Instructions (Signed)
  Let's keep going with the 40 mg prednisone daily.  Keep the wounds clean with soap and water then apply antibacterial ointment to them and keep covered with clean bandage.   I'll see you back in 1-2 weeks.    If you have questions or concerns please do not hesitate to call at [phone].  Lucila Maine, DO PGY-2, [facility] 11/29/2016 4:39 PM

## 2016-11-29 NOTE — Progress Notes (Signed)
    Subjective:    Patient ID: Christine [name], female    DOB: 05/13/1970, 46 y.o.   MRN: [id number]   CC: rash   Patient has been on 40 mg prednisone for almost 2 weeks, she states the itching has improved greatly but her lower extremities still have wounds that are weeping and painful. She denies fevers or chills. She has been cleaning wounds with H202 and soap and water then covering with bandage. Used bacitracin ointment until she ran out. She reports she eats well. Has trouble sleeping due to pain on leg from rash. She feels well overall but is frustrated rash is persistent.   Smoking status reviewed- current every day smoker  Review of Systems- see HPI   Objective:  BP 138/78   Pulse 98   Temp 98.2 F (36.8 C) (Oral)   Wt 156 lb (70.8 kg)   LMP 11/27/2016   SpO2 96%   BMI 23.04 kg/m  Vitals and nursing note reviewed  General: well nourished, in no acute distress Cardiac: RRR, clear S1 and S2, no murmurs, rubs, or gallops Respiratory: clear to auscultation bilaterally, no increased work of breathing Extremities: no edema or cyanosis Skin: persistent diffuse hyperpigmented areas over back, stomach arms, and legs with varying stages of open papules leaking clear fluid. Left lower leg with some purulence from several open areas. No increased warmth, no swelling. Evidence of excoriation around areas of rash. Neuro: alert and oriented, no focal deficits  Assessment & Plan:    Rash and nonspecific skin eruption  Etiology of rash unclear, path read as chronic irritation dermatitis but no clear source. Differential includes dermatitis herpetiformis or porphyria versus allergy to some food or medication she may be taking. Poor wound healing may be secondary to immunocompromised state or malnutrition.  -continue 40 mg prednisone daily for additional 2 weeks, follow up at 2 weeks to start taper -will check TSH, ANA, ESR, and CMP today -follow up with patient via phone regarding  labs -continue to keep wounds clean with soap and water, dry well, apply bacitracin ointment and bandage to open wounds    Return in about 2 weeks (around 12/13/2016), or if symptoms worsen or fail to improve.   Lucila Maine, DO Family Medicine Resident PGY-2

## 2016-11-30 LAB — CMP14+EGFR
A/G RATIO: 1.5 (ref 1.2–2.2)
ALT: 15 IU/L (ref 0–32)
AST: 22 IU/L (ref 0–40)
Albumin: 4.2 g/dL (ref 3.5–5.5)
Alkaline Phosphatase: 86 IU/L (ref 39–117)
BUN/Creatinine Ratio: 15 (ref 9–23)
BUN: 17 mg/dL (ref 6–24)
Bilirubin Total: 0.3 mg/dL (ref 0.0–1.2)
CALCIUM: 9.1 mg/dL (ref 8.7–10.2)
CO2: 17 mmol/L — AB (ref 20–29)
CREATININE: 1.16 mg/dL — AB (ref 0.57–1.00)
Chloride: 96 mmol/L (ref 96–106)
GFR calc Af Amer: 65 mL/min/{1.73_m2} (ref >59)
GFR, EST NON AFRICAN AMERICAN: 57 mL/min/{1.73_m2} — AB (ref >59)
Globulin, Total: 2.8 g/dL (ref 1.5–4.5)
Glucose: 441 mg/dL — ABNORMAL HIGH (ref 65–99)
POTASSIUM: 5.1 mmol/L (ref 3.5–5.2)
Sodium: 133 mmol/L — ABNORMAL LOW (ref 134–144)
TOTAL PROTEIN: 7 g/dL (ref 6.0–8.5)

## 2016-11-30 LAB — SEDIMENTATION RATE: Sed Rate: 15 mm/hr (ref 0–32)

## 2016-11-30 LAB — TSH: TSH: 0.506 u[IU]/mL (ref 0.450–4.500)

## 2016-11-30 LAB — ANA: Anti Nuclear Antibody(ANA): NEGATIVE

## 2016-12-02 ENCOUNTER — Telehealth: Payer: Self-pay | Admitting: Family Medicine

## 2016-12-02 NOTE — Telephone Encounter (Signed)
Will see her tomorrow in clinic. Thank you.

## 2016-12-02 NOTE — Telephone Encounter (Signed)
Will forward to PCP.  Teaghan Melrose L, RN  

## 2016-12-02 NOTE — Progress Notes (Signed)
Patient is aware but is having hand swelling around her biopsy site.  Patient will see pcp in the morning to check on this. Christine [name],CMA

## 2016-12-02 NOTE — Telephone Encounter (Signed)
Pt's right arm is swollen. Pt denies appt at this time, pt wants to know what PCP wants her to do. ep

## 2016-12-03 ENCOUNTER — Ambulatory Visit (INDEPENDENT_AMBULATORY_CARE_PROVIDER_SITE_OTHER): Payer: No Typology Code available for payment source | Admitting: Family Medicine

## 2016-12-03 ENCOUNTER — Encounter: Payer: Self-pay | Admitting: Family Medicine

## 2016-12-03 ENCOUNTER — Observation Stay (HOSPITAL_COMMUNITY): Payer: Self-pay

## 2016-12-03 ENCOUNTER — Inpatient Hospital Stay
Admission: AD | Admit: 2016-12-03 | Payer: No Typology Code available for payment source | Source: Ambulatory Visit | Admitting: Family Medicine

## 2016-12-03 ENCOUNTER — Encounter (HOSPITAL_COMMUNITY): Payer: Self-pay | Admitting: Emergency Medicine

## 2016-12-03 ENCOUNTER — Inpatient Hospital Stay (HOSPITAL_COMMUNITY)
Admission: EM | Admit: 2016-12-03 | Discharge: 2016-12-11 | DRG: 872 | Disposition: A | Discharge status: Home / Self Care | Payer: Self-pay | Attending: Family Medicine | Admitting: Family Medicine

## 2016-12-03 VITALS — BP 99/58 | HR 113 | Temp 98.4°F | Wt 147.0 lb

## 2016-12-03 DIAGNOSIS — R739 Hyperglycemia, unspecified: Secondary | ICD-10-CM

## 2016-12-03 DIAGNOSIS — F17210 Nicotine dependence, cigarettes, uncomplicated: Secondary | ICD-10-CM | POA: Diagnosis present

## 2016-12-03 DIAGNOSIS — L03119 Cellulitis of unspecified part of limb: Secondary | ICD-10-CM

## 2016-12-03 DIAGNOSIS — A419 Sepsis, unspecified organism: Principal | ICD-10-CM

## 2016-12-03 DIAGNOSIS — T380X5A Adverse effect of glucocorticoids and synthetic analogues, initial encounter: Secondary | ICD-10-CM | POA: Diagnosis present

## 2016-12-03 DIAGNOSIS — E860 Dehydration: Secondary | ICD-10-CM | POA: Diagnosis present

## 2016-12-03 DIAGNOSIS — M25421 Effusion, right elbow: Secondary | ICD-10-CM | POA: Diagnosis present

## 2016-12-03 DIAGNOSIS — I10 Essential (primary) hypertension: Secondary | ICD-10-CM | POA: Diagnosis present

## 2016-12-03 DIAGNOSIS — E871 Hypo-osmolality and hyponatremia: Secondary | ICD-10-CM | POA: Diagnosis present

## 2016-12-03 DIAGNOSIS — B373 Candidiasis of vulva and vagina: Secondary | ICD-10-CM | POA: Diagnosis present

## 2016-12-03 DIAGNOSIS — E113299 Type 2 diabetes mellitus with mild nonproliferative diabetic retinopathy without macular edema, unspecified eye: Secondary | ICD-10-CM | POA: Diagnosis present

## 2016-12-03 DIAGNOSIS — D649 Anemia, unspecified: Secondary | ICD-10-CM | POA: Diagnosis present

## 2016-12-03 DIAGNOSIS — B370 Candidal stomatitis: Secondary | ICD-10-CM | POA: Diagnosis present

## 2016-12-03 DIAGNOSIS — E872 Acidosis: Secondary | ICD-10-CM | POA: Diagnosis present

## 2016-12-03 DIAGNOSIS — L03113 Cellulitis of right upper limb: Secondary | ICD-10-CM | POA: Diagnosis present

## 2016-12-03 DIAGNOSIS — F1010 Alcohol abuse, uncomplicated: Secondary | ICD-10-CM | POA: Diagnosis present

## 2016-12-03 DIAGNOSIS — Z7952 Long term (current) use of systemic steroids: Secondary | ICD-10-CM

## 2016-12-03 DIAGNOSIS — L0390 Cellulitis, unspecified: Secondary | ICD-10-CM

## 2016-12-03 DIAGNOSIS — E1165 Type 2 diabetes mellitus with hyperglycemia: Secondary | ICD-10-CM | POA: Diagnosis present

## 2016-12-03 DIAGNOSIS — N179 Acute kidney failure, unspecified: Secondary | ICD-10-CM | POA: Diagnosis present

## 2016-12-03 HISTORY — DX: Cellulitis, unspecified: L03.90

## 2016-12-03 LAB — CBC WITH DIFFERENTIAL/PLATELET
BASOS ABS: 0 10*3/uL (ref 0.0–0.1)
BASOS PCT: 0 %
EOS ABS: 0 10*3/uL (ref 0.0–0.7)
Eosinophils Relative: 0 %
HCT: 30.1 % — ABNORMAL LOW (ref 36.0–46.0)
Hemoglobin: 10.1 g/dL — ABNORMAL LOW (ref 12.0–15.0)
LYMPHS ABS: 0.4 10*3/uL — AB (ref 0.7–4.0)
Lymphocytes Relative: 2 %
MCH: 30.5 pg (ref 26.0–34.0)
MCHC: 33.6 g/dL (ref 30.0–36.0)
MCV: 90.9 fL (ref 78.0–100.0)
MONO ABS: 1.2 10*3/uL — AB (ref 0.1–1.0)
Monocytes Relative: 6 %
NEUTROS ABS: 18.4 10*3/uL — AB (ref 1.7–7.7)
Neutrophils Relative %: 92 %
PLATELETS: 169 10*3/uL (ref 150–400)
RBC: 3.31 MIL/uL — ABNORMAL LOW (ref 3.87–5.11)
RDW: 14.7 % (ref 11.5–15.5)
WBC: 20 10*3/uL — ABNORMAL HIGH (ref 4.0–10.5)

## 2016-12-03 LAB — URINALYSIS, ROUTINE W REFLEX MICROSCOPIC
Bacteria, UA: NONE SEEN
Bilirubin Urine: NEGATIVE
Ketones, ur: 5 mg/dL — AB
Leukocytes, UA: NEGATIVE
NITRITE: NEGATIVE
PH: 5 (ref 5.0–8.0)
Protein, ur: 30 mg/dL — AB
SPECIFIC GRAVITY, URINE: 1.021 (ref 1.005–1.030)
Squamous Epithelial / LPF: NONE SEEN

## 2016-12-03 LAB — COMPREHENSIVE METABOLIC PANEL
ALT: 13 U/L — AB (ref 14–54)
ANION GAP: 13 (ref 5–15)
AST: 13 U/L — ABNORMAL LOW (ref 15–41)
Albumin: 3.3 g/dL — ABNORMAL LOW (ref 3.5–5.0)
Alkaline Phosphatase: 64 U/L (ref 38–126)
BUN: 21 mg/dL — ABNORMAL HIGH (ref 6–20)
CALCIUM: 9 mg/dL (ref 8.9–10.3)
CO2: 19 mmol/L — AB (ref 22–32)
Chloride: 94 mmol/L — ABNORMAL LOW (ref 101–111)
Creatinine, Ser: 1.46 mg/dL — ABNORMAL HIGH (ref 0.44–1.00)
GFR calc non Af Amer: 42 mL/min — ABNORMAL LOW (ref >60)
GFR, EST AFRICAN AMERICAN: 49 mL/min — AB (ref >60)
Glucose, Bld: 408 mg/dL — ABNORMAL HIGH (ref 65–99)
Potassium: 4.3 mmol/L (ref 3.5–5.1)
SODIUM: 126 mmol/L — AB (ref 135–145)
Total Bilirubin: 0.9 mg/dL (ref 0.3–1.2)
Total Protein: 6.4 g/dL — ABNORMAL LOW (ref 6.5–8.1)

## 2016-12-03 LAB — LACTIC ACID, PLASMA
LACTIC ACID, VENOUS: 2.2 mmol/L — AB (ref 0.5–1.9)
LACTIC ACID, VENOUS: 1.3 mmol/L (ref 0.5–1.9)

## 2016-12-03 LAB — GLUCOSE, CAPILLARY: Glucose-Capillary: 410 mg/dL — ABNORMAL HIGH (ref 65–99)

## 2016-12-03 LAB — C-REACTIVE PROTEIN: CRP: 25.1 mg/dL — AB (ref <1.0)

## 2016-12-03 LAB — I-STAT CG4 LACTIC ACID, ED
LACTIC ACID, VENOUS: 1.84 mmol/L (ref 0.5–1.9)
Lactic Acid, Venous: 2.42 mmol/L (ref 0.5–1.9)

## 2016-12-03 LAB — PHOSPHORUS: PHOSPHORUS: 3.1 mg/dL (ref 2.5–4.6)

## 2016-12-03 LAB — MAGNESIUM: Magnesium: 1.5 mg/dL — ABNORMAL LOW (ref 1.7–2.4)

## 2016-12-03 LAB — SEDIMENTATION RATE: Sed Rate: 40 mm/hr — ABNORMAL HIGH (ref 0–22)

## 2016-12-03 MED ORDER — PIPERACILLIN-TAZOBACTAM 3.375 G IVPB
3.3750 g | Freq: Three times a day (TID) | INTRAVENOUS | Status: DC
  Administered 2016-12-03 – 2016-12-07 (×11): 3.375 g via INTRAVENOUS
  Filled 2016-12-03 (×12): qty 50

## 2016-12-03 MED ORDER — OXYCODONE HCL 5 MG PO TABS
5.0000 mg | ORAL_TABLET | ORAL | Status: DC | PRN
  Administered 2016-12-03 – 2016-12-11 (×27): 5 mg via ORAL
  Filled 2016-12-03 (×27): qty 1

## 2016-12-03 MED ORDER — PREDNISONE 20 MG PO TABS
20.0000 mg | ORAL_TABLET | Freq: Every day | ORAL | Status: DC
  Administered 2016-12-04 – 2016-12-07 (×4): 20 mg via ORAL
  Filled 2016-12-03 (×4): qty 1

## 2016-12-03 MED ORDER — ONDANSETRON HCL 4 MG/2ML IJ SOLN: 4.0000 mg | Freq: Four times a day (QID) | INTRAMUSCULAR | Status: DC | PRN

## 2016-12-03 MED ORDER — VANCOMYCIN HCL IN DEXTROSE 1-5 GM/200ML-% IV SOLN: 1000.0000 mg | Freq: Once | INTRAVENOUS | Status: DC

## 2016-12-03 MED ORDER — INSULIN ASPART 100 UNIT/ML ~~LOC~~ SOLN
15.0000 [IU] | Freq: Once | SUBCUTANEOUS | Status: AC
  Administered 2016-12-03: 15 [IU] via SUBCUTANEOUS

## 2016-12-03 MED ORDER — SODIUM CHLORIDE 0.9% FLUSH
3.0000 mL | Freq: Two times a day (BID) | INTRAVENOUS | Status: DC
  Administered 2016-12-05 – 2016-12-11 (×6): 3 mL via INTRAVENOUS

## 2016-12-03 MED ORDER — POLYETHYLENE GLYCOL 3350 17 G PO PACK
17.0000 g | PACK | Freq: Every day | ORAL | Status: DC | PRN
  Administered 2016-12-07: 17 g via ORAL
  Filled 2016-12-03: qty 1

## 2016-12-03 MED ORDER — SULFAMETHOXAZOLE-TRIMETHOPRIM 800-160 MG PO TABS: 1.0000 | ORAL_TABLET | Freq: Two times a day (BID) | ORAL | 0 refills | Status: DC

## 2016-12-03 MED ORDER — ONDANSETRON HCL 4 MG PO TABS: 4.0000 mg | ORAL_TABLET | Freq: Four times a day (QID) | ORAL | Status: DC | PRN

## 2016-12-03 MED ORDER — SODIUM CHLORIDE 0.9 % IV BOLUS (SEPSIS)
1000.0000 mL | Freq: Once | INTRAVENOUS | Status: AC
  Administered 2016-12-03: 1000 mL via INTRAVENOUS

## 2016-12-03 MED ORDER — ACETAMINOPHEN 650 MG RE SUPP: 650.0000 mg | Freq: Four times a day (QID) | RECTAL | Status: DC | PRN

## 2016-12-03 MED ORDER — SODIUM CHLORIDE 0.9 % IV SOLN
INTRAVENOUS | Status: DC
  Administered 2016-12-03 – 2016-12-11 (×12): via INTRAVENOUS

## 2016-12-03 MED ORDER — VANCOMYCIN HCL IN DEXTROSE 1-5 GM/200ML-% IV SOLN
1000.0000 mg | Freq: Once | INTRAVENOUS | Status: AC
  Administered 2016-12-03: 1000 mg via INTRAVENOUS
  Filled 2016-12-03: qty 200

## 2016-12-03 MED ORDER — ACETAMINOPHEN 325 MG PO TABS: 650.0000 mg | ORAL_TABLET | Freq: Four times a day (QID) | ORAL | Status: DC | PRN

## 2016-12-03 MED ORDER — INSULIN ASPART 100 UNIT/ML ~~LOC~~ SOLN
8.0000 [IU] | Freq: Once | SUBCUTANEOUS | Status: AC
  Administered 2016-12-03: 8 [IU] via INTRAVENOUS
  Filled 2016-12-03: qty 1

## 2016-12-03 MED ORDER — VANCOMYCIN HCL IN DEXTROSE 750-5 MG/150ML-% IV SOLN
750.0000 mg | Freq: Two times a day (BID) | INTRAVENOUS | Status: DC
  Administered 2016-12-04 – 2016-12-07 (×7): 750 mg via INTRAVENOUS
  Filled 2016-12-03 (×8): qty 150

## 2016-12-03 MED ORDER — HYDROMORPHONE HCL 1 MG/ML IJ SOLN
0.5000 mg | Freq: Once | INTRAMUSCULAR | Status: AC
  Administered 2016-12-03: 0.5 mg via INTRAVENOUS
  Filled 2016-12-03: qty 1

## 2016-12-03 MED ORDER — ENOXAPARIN SODIUM 40 MG/0.4ML ~~LOC~~ SOLN
40.0000 mg | SUBCUTANEOUS | Status: DC
  Administered 2016-12-04 – 2016-12-10 (×7): 40 mg via SUBCUTANEOUS
  Filled 2016-12-03 (×8): qty 0.4

## 2016-12-03 MED ORDER — NYSTATIN 100000 UNIT/ML MT SUSP
5.0000 mL | Freq: Four times a day (QID) | OROMUCOSAL | Status: DC
  Administered 2016-12-03 – 2016-12-11 (×32): 500000 [IU] via ORAL
  Filled 2016-12-03 (×32): qty 5

## 2016-12-03 MED ORDER — GABAPENTIN 300 MG PO CAPS: 300.0000 mg | ORAL_CAPSULE | Freq: Three times a day (TID) | ORAL | Status: DC

## 2016-12-03 MED ORDER — FOLIC ACID 1 MG PO TABS
1.0000 mg | ORAL_TABLET | Freq: Every day | ORAL | Status: DC
  Administered 2016-12-03 – 2016-12-11 (×9): 1 mg via ORAL
  Filled 2016-12-03 (×9): qty 1

## 2016-12-03 MED ORDER — INSULIN ASPART 100 UNIT/ML ~~LOC~~ SOLN
0.0000 [IU] | Freq: Three times a day (TID) | SUBCUTANEOUS | Status: DC
  Administered 2016-12-04: 5 [IU] via SUBCUTANEOUS
  Administered 2016-12-04: 8 [IU] via SUBCUTANEOUS
  Administered 2016-12-04: 3 [IU] via SUBCUTANEOUS
  Administered 2016-12-05: 8 [IU] via SUBCUTANEOUS
  Administered 2016-12-05: 5 [IU] via SUBCUTANEOUS
  Administered 2016-12-05: 3 [IU] via SUBCUTANEOUS
  Administered 2016-12-06: 5 [IU] via SUBCUTANEOUS
  Administered 2016-12-06: 8 [IU] via SUBCUTANEOUS

## 2016-12-03 MED ORDER — VITAMIN B-1 100 MG PO TABS
100.0000 mg | ORAL_TABLET | Freq: Every day | ORAL | Status: DC
  Administered 2016-12-03 – 2016-12-11 (×9): 100 mg via ORAL
  Filled 2016-12-03 (×9): qty 1

## 2016-12-03 MED ORDER — PIPERACILLIN-TAZOBACTAM 3.375 G IVPB 30 MIN
3.3750 g | Freq: Once | INTRAVENOUS | Status: AC
  Administered 2016-12-03: 3.375 g via INTRAVENOUS
  Filled 2016-12-03: qty 50

## 2016-12-03 NOTE — Progress Notes (Addendum)
Pharmacy Antibiotic Note  Christine [name] is a 46 y.o. female admitted on 12/03/2016 with R arm cellulitis. Pt received one dose of vancomycin in the ED. Planning to begin zosyn.  WBC 20, afeb, SCr 11. > 1.6, eCrCl ~ 50.   Plan: -Zosyn 3.375 g IV q8h -Monitor renal fx, cultures   Weight: 154 lb (69.9 kg)  Temp (24hrs), Avg:98.5 F (36.9 C), Min:98.4 F (36.9 C), Max:98.5 F (36.9 C)   Recent Labs Lab 11/29/16 1651 12/03/16 1236 12/03/16 1311 12/03/16 1514  WBC  --  20.0*  --   --   CREATININE 1.16* 1.46*  --   --   LATICACIDVEN  --   --  1.84 2.42*      Antimicrobials this admission: 8/7 vancomycin x1  Dose adjustments this admission: N/A  Microbiology results: 8/7 blood cx:   Christine [name] 12/03/2016 5:42 PM    Addendum -Planning to resume vancomycin  -Will start with 750/12h   Christine [name]  12/03/2016 6:04 PM

## 2016-12-03 NOTE — ED Notes (Signed)
Pt states no need to void currently.

## 2016-12-03 NOTE — ED Triage Notes (Signed)
Pt sent from family medicine for right arm wound infection with redness and swelling.

## 2016-12-03 NOTE — ED Notes (Signed)
Patient transported to X-ray 

## 2016-12-03 NOTE — ED Provider Notes (Signed)
[facility] DEPT Provider Note   CSN: [id number] Arrival date & time: 12/03/16  1221     History   Chief Complaint Chief Complaint  Patient presents with  . Wound Infection    HPI   Blood pressure 130/74, pulse 88, temperature 98.5 F (36.9 C), temperature source Oral, resp. rate 18, last menstrual period 11/27/2016, SpO2 100 %.  Christine [name] is a 46 y.o. female with past medical history significant for tobacco use, alcohol abuse (no history of DTs or hallucinations), hypertension, diabetes (patient states that she does not take any medication for this), alcohol abuse from primary care for evaluation of possible sepsis (Soft BP with systolic <631 in office) secondary to a skin infection on the right forearm. Patient had a biopsy for chronic rash on the right forearm and left lower extremity 3 weeks ago, 2 days ago the area started swelling it had warmth she reports tactile fever with no nausea, vomiting. She denies history of IV drug use. The areas of the biopsies been nonhealing and producing purulent drainage. 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  Past Medical History:  Diagnosis Date  . Alcohol abuse   . Chronic pancreatitis ([place])   . Diabetes mellitus   . Hypertension   . Substance abuse     Patient Active Problem List   Diagnosis Date Noted  . Cellulitis of right upper extremity 12/03/2016  . Chronic contact dermatitis 11/19/2016  . Bilateral knee pain 12/30/2014  . Tinea versicolor 08/31/2014  . Encounter for smoking cessation counseling 01/13/2014  . Health care maintenance 01/13/2014  . 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 Vitamin D deficiency 11/01/2013  . Lower extremity neuropathy 10/28/2013  . Rash and nonspecific skin eruption 10/28/2013  . Venous insufficiency 02/05/2013  . Essential hypertension, benign 12/25/2012  . Chronic alcoholic  pancreatitis 49/70/2637  . Protein-calorie malnutrition, severe ([place])  11/18/2012  . Depression 09/29/2012  . Abdominal pain, unspecified site 07/18/2012  . Neuropathic pain 01/29/2011  . DRUG ABUSE, HX OF 07/05/2009  . ANEMIA 09/28/2008  . HYPERLIPIDEMIA 09/12/2008  . TOBACCO USER 09/12/2008  . Diabetes mellitus with neuropathy ([place]) 06/26/2006    History reviewed. No pertinent surgical history.  OB History    No data available       Home Medications    Prior to Admission medications   Medication Sig Start Date End Date Taking? Authorizing Provider  ACCU-CHEK FASTCLIX LANCETS MISC Check sugar 5 x daily 12/12/14   Leone Brand, MD  acetaminophen (TYLENOL) 500 MG tablet Take 1 tablet (500 mg total) by mouth every 6 (six) hours as needed. 08/11/16   Waynetta Pean, PA-C  Aspirin-Acetaminophen-Caffeine (GOODY HEADACHE PO) Take 1 Package by mouth 2 (two) times daily as needed (pain).    [provider]  Blood Glucose Monitoring Suppl (ACCU-CHEK ADVANTAGE DIABETES) kit Use as instructed 10/28/13   Kuneff, Renee A, DO  Cholecalciferol (VITAMIN D3) 50000 UNITS CAPS Take 50,000 Units by mouth once a week. 10/12/14   Kuneff, Renee A, DO  gabapentin (NEURONTIN) 300 MG capsule Take 1 capsule (300 mg total) by mouth 3 (three) times daily. 03/15/16   Steve Rattler, DO  glucose blood (ACCU-CHEK AVIVA) test strip Check sugar 6 x daily 05/10/15   Melancon, York Ram, MD  glucose blood test strip Check sugar 5x daily 05/03/15   Leone Brand, MD  hydrOXYzine (ATARAX/VISTARIL) 25 MG tablet Take 1 tablet (25 mg total) by mouth every 8 (eight)  hours as needed. 08/10/16   Ripley Fraise, MD  lisinopril-hydrochlorothiazide (ZESTORETIC) 20-12.5 MG tablet Take 1 tablet by mouth daily. 11/08/16   Steve Rattler, DO  methocarbamol (ROBAXIN) 500 MG tablet Take 1 tablet (500 mg total) by mouth 2 (two) times daily as needed for muscle spasms. 08/11/16   Waynetta Pean, PA-C  predniSONE (DELTASONE) 20 MG tablet Take 2 tablets (40 mg total) by mouth daily with breakfast.  11/15/16   Lucila Maine C, DO  sulfamethoxazole-trimethoprim (BACTRIM DS) 800-160 MG tablet Take 1 tablet by mouth 2 (two) times daily. 12/03/16   Steve Rattler, DO  Vitamin D, Ergocalciferol, (DRISDOL) 50000 UNITS CAPS capsule Take 1 capsule (50,000 Units total) by mouth every 7 (seven) days. 09/02/14   Howard Pouch A, DO    Family History No family history on file.  Social History Social History  Substance Use Topics  . Smoking status: Current Every Day Smoker    Packs/day: 0.50    Years: 20.00    Types: Cigarettes  . Smokeless tobacco: Never Used     Comment: working on quitting  . Alcohol use 0.0 oz/week     Comment: stopped drinking beers a month ago     Allergies   Patient has no known allergies.   Review of Systems Review of Systems  A complete review of systems was obtained and all systems are negative except as noted in the HPI and PMH.   Physical Exam Updated Vital Signs BP 113/75   Pulse 84   Temp 98.5 F (36.9 C) (Oral)   Resp 18   Wt 69.9 kg (154 lb)   LMP 11/27/2016   SpO2 97%   BMI 22.74 kg/m   Physical Exam  Constitutional: She is oriented to person, place, and time. She appears well-developed and well-nourished. No distress.  HENT:  Head: Normocephalic and atraumatic.  Mouth/Throat: Oropharynx is clear and moist.  Eyes: Pupils are equal, round, and reactive to light. Conjunctivae and EOM are normal.  Neck: Normal range of motion.  Cardiovascular: Normal rate, regular rhythm and intact distal pulses.   Pulmonary/Chest: Effort normal and breath sounds normal.  Abdominal: Soft. There is no tenderness.  Musculoskeletal: Normal range of motion.  Neurological: She is alert and oriented to person, place, and time.  Skin: She is not diaphoretic.  Non-circumferential cellulitis to right forearm past the elbow. Purulent drainage from biopsy puncture wound. No focal fluctuance. Distally neurovascularly intact. No underlying crepitance.   Psychiatric:  She has a normal mood and affect.  Nursing note and vitals reviewed.            ED Treatments / Results  Labs (all labs ordered are listed, but only abnormal results are displayed) Labs Reviewed  COMPREHENSIVE METABOLIC PANEL - Abnormal; Notable for the following:       Result Value   Sodium 126 (*)    Chloride 94 (*)    CO2 19 (*)    Glucose, Bld 408 (*)    BUN 21 (*)    Creatinine, Ser 1.46 (*)    Total Protein 6.4 (*)    Albumin 3.3 (*)    AST 13 (*)    ALT 13 (*)    GFR calc non Af Amer 42 (*)    GFR calc Af Amer 49 (*)    All other components within normal limits  CBC WITH DIFFERENTIAL/PLATELET - Abnormal; Notable for the following:    WBC 20.0 (*)    RBC 3.31 (*)  Hemoglobin 10.1 (*)    HCT 30.1 (*)    Neutro Abs 18.4 (*)    Lymphs Abs 0.4 (*)    Monocytes Absolute 1.2 (*)    All other components within normal limits  CULTURE, BLOOD (ROUTINE X 2)  CULTURE, BLOOD (ROUTINE X 2)  RPR  HIV ANTIBODY (ROUTINE TESTING)  URINALYSIS, ROUTINE W REFLEX MICROSCOPIC  I-STAT CG4 LACTIC ACID, ED  I-STAT BETA HCG BLOOD, ED ([facility], [facility], [facility] ONLY)  I-STAT BETA HCG BLOOD, ED ([facility], [facility], [facility] ONLY)  I-STAT CG4 LACTIC ACID, ED    EKG  EKG Interpretation None       Radiology No results found.  Procedures Procedures (including critical care time)  Medications Ordered in ED Medications  vancomycin (VANCOCIN) IVPB 1000 mg/200 mL premix (1,000 mg Intravenous New Bag/Given 12/03/16 1448)  sodium chloride 0.9 % bolus 1,000 mL (not administered)  sodium chloride 0.9 % bolus 1,000 mL (1,000 mLs Intravenous New Bag/Given 12/03/16 1427)  HYDROmorphone (DILAUDID) injection 0.5 mg (0.5 mg Intravenous Given 12/03/16 1428)  insulin aspart (novoLOG) injection 8 Units (8 Units Intravenous Given 12/03/16 1433)     Initial Impression / Assessment and Plan / ED Course  I have reviewed the triage vital signs and the nursing notes.  Pertinent labs & imaging results that were available during  my care of the patient were reviewed by me and considered in my medical decision making (see chart for details).     Vitals:   12/03/16 1445 12/03/16 1447 12/03/16 1500 12/03/16 1515  BP: 122/77  118/76 113/75  Pulse: 89  85 84  Resp:      Temp:      TempSrc:      SpO2: 97%  97% 97%  Weight:  69.9 kg (154 lb)      Medications  vancomycin (VANCOCIN) IVPB 1000 mg/200 mL premix (1,000 mg Intravenous New Bag/Given 12/03/16 1448)  sodium chloride 0.9 % bolus 1,000 mL (not administered)  sodium chloride 0.9 % bolus 1,000 mL (1,000 mLs Intravenous New Bag/Given 12/03/16 1427)  HYDROmorphone (DILAUDID) injection 0.5 mg (0.5 mg Intravenous Given 12/03/16 1428)  insulin aspart (novoLOG) injection 8 Units (8 Units Intravenous Given 12/03/16 1433)    Christine [name] is 46 y.o. female sent from primary care for possible sepsis, systolic was soft in the office at under 100. In the ED is 130/74. Patient is nontoxic-appearing, she does have a large cellulitis to her dominant forearm. Pain not out of proportion, no subcutaneous emphysema to suggest a necrotizing fasciitis. Patient is started on vancomycin, aggressively hydrated with 2 L. Blood cultures are drawn. Her lactic acid is reassuring however she does have a significant leukocytosis of 20. Her blood sugar today is elevated over 500 however she has no elevation in her pain gap. Patient is given 8 units of insulin  Discussed with family practice Dr. Grandville Silos who accepts admission.  Final Clinical Impressions(s) / ED Diagnoses   Final diagnoses:  Sepsis affecting skin ([place])  Hyperglycemia without ketosis    New Prescriptions New Prescriptions   No medications on file     Waynetta Pean 12/03/16 1524    Dorie Rank, MD 12/05/16 [phone]

## 2016-12-03 NOTE — Progress Notes (Signed)
CRITICAL VALUE ALERT  Critical Value:  Lactic Acid 2.2  Date & Time Notied:  12/03/16 9774  Provider Notified: Dr. Juanito Doom   Orders Received/Actions taken: No new orders

## 2016-12-03 NOTE — Progress Notes (Addendum)
    Subjective:    Patient ID: Christine [name], female    DOB: [birth year], 46 y.o.   MRN: [id number]   CC: arm swollen, painful  Woke up Sunday with right arm swollen, hot, and painful. It has been progressively getting worse. She is changing bandage daily to the unhealed punch biopsy site on her left arm and using bacitracin ointment on it. She was unable to work yesterday. She is unsure if she has had fevers. Endorses fatigue and overall not feeling well.  She is having problems bending her right wrist and fingers, cannot bend her right elbow due to pain.   She is currently on 40 mg prednisone daily for the rash which appears to be a chronic dermatitis however it has not improved much.   Smoking status reviewed- current every day smoker  Review of Systems- see HPI   Objective:  BP (!) 99/58   Pulse (!) 113   Temp 98.4 F (36.9 C) (Oral)   Wt 147 lb (66.7 kg)   LMP 11/27/2016   SpO2 97%   BMI 21.71 kg/m  Vitals and nursing note reviewed  General: appears fatigued, in NAD Cardiac: RRR, clear S1 and S2, no murmurs, rubs, or gallops Respiratory: clear to auscultation bilaterally, no increased work of breathing Extremities: right arm swollen, tender to light touch, decreased ROM in digits/elbow secondary to pain. Appears erythematous and warm.  Neuro: alert and oriented, no focal deficits  Assessment & Plan:    Cellulitis of right upper extremity  Cellulitis of right arm concerning for fascial involvement due to extreme tenderness and decreased ROM. BP also soft in clinic and patient does not appear well. Concerning for sepsis.  -patient transported to ED for evaluation -patient would likely benefit from fluids, IV antibiotics and MRI to evaluate RUE -notified charge nurse in ED of her arrival   Lucila Maine, [facility] Resident PGY-2

## 2016-12-03 NOTE — H&P (Signed)
[facility] Admission History and Physical Service Pager: [phone]  Patient name: Christine [name] Medical record number: [id number] Date of birth: March 04, 1971 Age: 46 y.o. Gender: female  Primary Care Provider: Steve Rattler, DO Consultants: None Code Status: Full  Chief Complaint: Right arm swelling, pain, wound drainage  Assessment and Plan: Keri A Pyles is a 46 y.o. female presenting with 3 day history of right arm swelling, pain, purulent wound drainage. PMH is significant for chronic alcoholic pancreatitis, DMII, HLD, tobacco abuse, depression, HTN, Rash  RIGHT Arm Cellulitis, meeting sepsis criteria: Initially met sepsis criteria in ED, Vitals stable on admission, patient afebrile. Physical exam showing erythema and swelling on right arm, mostly forearm with purulent discharge from punch biopsy site on forearm. Decreased ability to grip with right hand likely secondary to pain. Notable labs include a leukocytosis to 20 with a neutrophil predominance, creatinine 1.46, sodium 126, chloride 94, bicarbonate 19, glucose 408. Lactic acid 1.84 > 2.42. Cellulitis likely from poor wound healing after having punch biopsy of rash on her arm. Poor wound healing likely 2/2 recent prednisone course and smoking. Less likely necrotizing fasciitis given no subcutaneous emphysema however we will need to keep this on our differential due to difficulty moving right wrist. Status post 2 L of normal saline in ED. qSOFA score of 0. -admit to FPTS, MedSurg, attending Dr. Gwendlyn Deutscher -IV vancomycin and Zosyn per pharmacy, day 1 -taper prednisone dosage, will decrease to 20 mg daily -AM CBC, CMP -UA pending -Blood cultures x2, pending -Tylenol PRN for mild pain and Oxycodone IR 5 mg PRN for moderate to severe pain -Cardiac monitoring -X-ray of right arm;  elbow, forearm, wrist, hand  -We'll also x-ray left lower leg as there is another lesion here from the biopsy that is not  healed -maintenance IV fluids  Lactic Acidosis and non anion gap metabolic acidosis: Initial lactic acid 1.4 then increase to 2.4. Bicarb 19. Likely secondary to sepsis. -Trend lactic acid -IV fluids as above  AKI. Cr 1.46. Up from 1.16 on 08/03. Likely prerenal etiology given sepsis picture and decreased by mouth intake -trend Cr after fluid resucciation -UA pending -Can consider getting a FeNa if creatinine does not improve  Chronic diffuse rash: Has had a rash of unknown etiology for about 10 months. Has been seen at PCP office numerous times. Previous ANA and ESR within normal limits. Biopsies were taken a few weeks ago of her right arm and left leg. Has been on prednisone for about 3 weeks without any improvement per patient report. Of note patient does have various marks and scrapes on her skin of unknown etiology. Patient possibly falling at home due to alcohol abuse. -We will draw rheumatoid labs, ESR, CRP -Can try topical steroid if patient has any itchiness  Hypertension: Normotensive currently. -hold home BP meds (lisinopril-HCTZ) -can restart in becomes hypertensive  Hyponatremia. Na 126 on admission, corrected to 133 when considering glucose -Daily BMP  DMII and hyperglycemia: last A1C 6.0. Currently not on insulin, states she was taken off of this because her A1c was so bad. Likely has hyperglycemia secondary to prednisone use and infection. S/p 8 units of novolog in ED.  -SSI-moderate -Carb modified diet  H/o Alcohol Abuse. Pt drinks 5 beers a day. Denies drinking alcohol on day of admission -CIWA protocol -thiamine and folate  Tobacco Abuse -nicotine patch prn  Thrush: Seen on physical exam -Nystatin mouthwash - Checking HIV  FEN/GI: Carb modified diet, mIVF NS Prophylaxis: LMWH  Disposition: Admit to FPTS, med surg, attending Dr. Gwendlyn Deutscher  History of Present Illness:  Christine [name] is a 46 y.o. female presenting with cellulitis.   Patient states that for  the last 3 days that increased pain and swelling in her right arm. She notes that she had biopsies taken of her right arm and left leg about one month ago because she has had a rash of unknown etiology for about 10 months. Patient notes that she has had subjective chills at home but no fevers. States that she has been on prednisone for about 2 weeks. Patient states that the prednisone did not help much with her rash. The rash that she has had for about 10 months is all over her body and is characterized as itchy. She smokes about 10 cigarettes a day since she is 46 years old. She drinks about 5 cans of beer a day. Denies having any alcohol today. Denies any illicit drug use. Denies any chest pain, shortness of breath, abdominal pain, nausea, vomiting, diarrhea, constipation, increased urinary frequency, dysuria.   Pt presented to the ED, normotensive, tachycardic, afebrile, satting well on RA%. Given tachycardia and known infection source, code sepsis was initiated. Labs on admission significant for WBC 20, hyponatremia 126, CBG 408, Cr 1.46, Lactic acid 2.42. 2 L normal saline bolus given.    Review Of Systems: Per HPI with the following additions:   Review of Systems  Constitutional: Positive for chills. Negative for fever.  HENT: Negative for congestion.   Respiratory: Negative for cough, shortness of breath and wheezing.   Cardiovascular: Negative for chest pain.  Gastrointestinal: Negative for abdominal pain, constipation, diarrhea, nausea and vomiting.  Genitourinary: Negative for dysuria and urgency.  Musculoskeletal: Positive for joint pain.  Skin: Positive for itching and rash.  Neurological: Negative for headaches.    Patient Active Problem List   Diagnosis Date Noted  . Cellulitis of right upper extremity 12/03/2016  . Cellulitis 12/03/2016  . Chronic contact dermatitis 11/19/2016  . Bilateral knee pain 12/30/2014  . Tinea versicolor 08/31/2014  . Encounter for smoking cessation  counseling 01/13/2014  . Health care maintenance 01/13/2014  . 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 Vitamin D deficiency 11/01/2013  . Lower extremity neuropathy 10/28/2013  . Rash and nonspecific skin eruption 10/28/2013  . Venous insufficiency 02/05/2013  . Essential hypertension, benign 12/25/2012  . Chronic alcoholic  pancreatitis 38/45/3646  . Protein-calorie malnutrition, severe ([place]) 11/18/2012  . Depression 09/29/2012  . Abdominal pain, unspecified site 07/18/2012  . Neuropathic pain 01/29/2011  . DRUG ABUSE, HX OF 07/05/2009  . ANEMIA 09/28/2008  . HYPERLIPIDEMIA 09/12/2008  . TOBACCO USER 09/12/2008  . Diabetes mellitus with neuropathy ([place]) 06/26/2006    Past Medical History: Past Medical History:  Diagnosis Date  . Alcohol abuse   . Chronic pancreatitis ([place])   . Diabetes mellitus   . Hypertension   . Substance abuse     Past Surgical History: History reviewed. No pertinent surgical history.  Social History: Social History  Substance Use Topics  . Smoking status: Current Every Day Smoker    Packs/day: 0.50    Years: 20.00    Types: Cigarettes  . Smokeless tobacco: Never Used     Comment: working on quitting  . Alcohol use 0.0 oz/week     Comment: stopped drinking beers a month ago   Additional social history: Lives home alone.   Please also refer to relevant sections of EMR.  Family History: No family history  on file.  Allergies and Medications: No Known Allergies No current facility-administered medications on file prior to encounter.    Current Outpatient Prescriptions on File Prior to Encounter  Medication Sig Dispense Refill  . lisinopril-hydrochlorothiazide (ZESTORETIC) 20-12.5 MG tablet Take 1 tablet by mouth daily. (Patient taking differently: Take 1 tablet by mouth at bedtime. ) 30 tablet 11  . predniSONE (DELTASONE) 20 MG tablet Take 2 tablets (40 mg total) by mouth daily with breakfast. 28 tablet 1  .  ACCU-CHEK FASTCLIX LANCETS MISC Check sugar 5 x daily 204 each 3  . Blood Glucose Monitoring Suppl (ACCU-CHEK ADVANTAGE DIABETES) kit Use as instructed 1 each 0  . glucose blood (ACCU-CHEK AVIVA) test strip Check sugar 6 x daily 200 each 3  . glucose blood test strip Check sugar 5x daily 200 each 5  . methocarbamol (ROBAXIN) 500 MG tablet Take 1 tablet (500 mg total) by mouth 2 (two) times daily as needed for muscle spasms. (Patient not taking: Reported on 12/03/2016) 20 tablet 0  . sulfamethoxazole-trimethoprim (BACTRIM DS) 800-160 MG tablet Take 1 tablet by mouth 2 (two) times daily. (Patient not taking: Reported on 12/03/2016) 20 tablet 0  . Vitamin D, Ergocalciferol, (DRISDOL) 50000 UNITS CAPS capsule Take 1 capsule (50,000 Units total) by mouth every 7 (seven) days. 8 capsule 0    Objective: BP 129/68   Pulse 93   Temp 98.5 F (36.9 C) (Oral)   Resp 18   Wt 154 lb (69.9 kg)   LMP 11/27/2016   SpO2 99%   BMI 22.74 kg/m  Exam: Physical Exam  Constitutional: She is oriented to person, place, and time. She appears well-developed and well-nourished. No distress.  HENT:  Head: Normocephalic.  Eyes: Pupils are equal, round, and reactive to light. Conjunctivae are normal. No scleral icterus.  Neck: Normal range of motion. Neck supple.  Cardiovascular: Normal rate, regular rhythm, normal heart sounds and intact distal pulses.  Exam reveals no gallop and no friction rub.   No murmur heard. Pulmonary/Chest: Effort normal and breath sounds normal. No respiratory distress.  Abdominal: Soft. Bowel sounds are normal. She exhibits no distension. There is no tenderness. There is no rebound and no guarding.  Musculoskeletal: She exhibits tenderness (right forearm).  Neurological: She is alert and oriented to person, place, and time.  Decreased grip strength in right arm secondary to pain. Normal grip strength in right. Spontaneously moves lower extremities.  Skin: Skin is warm and dry. Rash (diffuse  areas of hyperpigmented patches. ) noted.  Right forearm punch biopsy site wound, purulent drainage, swelling from midupper arm to hand, mild erythemia, very tender to palpation. Small punch biopsy site wounds on left lower extremities w/o swelling/pain/purulent drainage but with poor wound healing.  Psychiatric: She has a normal mood and affect.    Labs and Imaging: CBC BMET   Recent Labs Lab 12/03/16 1236  WBC 20.0*  HGB 10.1*  HCT 30.1*  PLT 169    Recent Labs Lab 12/03/16 1236  NA 126*  K 4.3  CL 94*  CO2 19*  BUN 21*  CREATININE 1.46*  GLUCOSE 408*  CALCIUM 9.0     Dg Forearm Right  Result Date: 12/03/2016 CLINICAL DATA:  46 year old female with a history of wound infection EXAM: RIGHT FOREARM - 2 VIEW COMPARISON:  None. FINDINGS: No acute bony abnormality. No erosive changes. No periosteal reaction. No radiopaque foreign body. Nonspecific soft tissue swelling of the proximal forearm. Vascular calcifications. IMPRESSION: Negative for acute bony abnormality. Nonspecific  soft tissue swelling of the proximal forearm. Vascular calcifications. Electronically Signed   By: Corrie Mckusick D.O.   On: 12/03/2016 17:05     Bonnita Hollow, MD 12/03/2016, 4:38 PM PGY-1, [facility] Intern pager: [phone], text pages welcome  I have separately seen and examined the patient. I have discussed the findings and exam with Dr. Grandville Silos and agree with the above note.  My changes/additions are outlined in BLUE.   Smitty Cords, MD [place], PGY-3

## 2016-12-03 NOTE — ED Notes (Signed)
Lactic acid result given to Dr. Tomi Bamberger

## 2016-12-03 NOTE — Assessment & Plan Note (Signed)
  Cellulitis of right arm concerning for fascial involvement due to extreme tenderness and decreased ROM. BP also soft in clinic and patient does not appear well. Concerning for sepsis.  -patient transported to ED for evaluation -patient would likely benefit from fluids, IV antibiotics and MRI to evaluate RUE -notified charge nurse in ED of her arrival

## 2016-12-03 NOTE — ED Notes (Signed)
Attempted report 

## 2016-12-04 ENCOUNTER — Inpatient Hospital Stay (HOSPITAL_COMMUNITY): Payer: Self-pay

## 2016-12-04 LAB — COMPREHENSIVE METABOLIC PANEL
ALBUMIN: 2.5 g/dL — AB (ref 3.5–5.0)
ALK PHOS: 54 U/L (ref 38–126)
ALT: 10 U/L — ABNORMAL LOW (ref 14–54)
AST: 11 U/L — AB (ref 15–41)
Anion gap: 9 (ref 5–15)
BILIRUBIN TOTAL: 0.8 mg/dL (ref 0.3–1.2)
BUN: 20 mg/dL (ref 6–20)
CALCIUM: 8.4 mg/dL — AB (ref 8.9–10.3)
CO2: 20 mmol/L — ABNORMAL LOW (ref 22–32)
CREATININE: 1.2 mg/dL — AB (ref 0.44–1.00)
Chloride: 99 mmol/L — ABNORMAL LOW (ref 101–111)
GFR calc Af Amer: >60 mL/min (ref >60)
GFR, EST NON AFRICAN AMERICAN: 53 mL/min — AB (ref >60)
GLUCOSE: 243 mg/dL — AB (ref 65–99)
Potassium: 3.9 mmol/L (ref 3.5–5.1)
Sodium: 128 mmol/L — ABNORMAL LOW (ref 135–145)
TOTAL PROTEIN: 5.3 g/dL — AB (ref 6.5–8.1)

## 2016-12-04 LAB — CBC
HCT: 28.1 % — ABNORMAL LOW (ref 36.0–46.0)
Hemoglobin: 9.4 g/dL — ABNORMAL LOW (ref 12.0–15.0)
MCH: 30.3 pg (ref 26.0–34.0)
MCHC: 33.5 g/dL (ref 30.0–36.0)
MCV: 90.6 fL (ref 78.0–100.0)
PLATELETS: 152 10*3/uL (ref 150–400)
RBC: 3.1 MIL/uL — ABNORMAL LOW (ref 3.87–5.11)
RDW: 14.6 % (ref 11.5–15.5)
WBC: 19.6 10*3/uL — ABNORMAL HIGH (ref 4.0–10.5)

## 2016-12-04 LAB — GLUCOSE, CAPILLARY
GLUCOSE-CAPILLARY: 287 mg/dL — AB (ref 65–99)
Glucose-Capillary: 232 mg/dL — ABNORMAL HIGH (ref 65–99)
Glucose-Capillary: 255 mg/dL — ABNORMAL HIGH (ref 65–99)
Glucose-Capillary: 261 mg/dL — ABNORMAL HIGH (ref 65–99)
Glucose-Capillary: 237 mg/dL — ABNORMAL HIGH (ref 65–99)
Glucose-Capillary: 280 mg/dL — ABNORMAL HIGH (ref 65–99)

## 2016-12-04 LAB — RPR: RPR: NONREACTIVE

## 2016-12-04 LAB — HIV ANTIBODY (ROUTINE TESTING W REFLEX)
HIV Screen 4th Generation wRfx: NONREACTIVE
HIV Screen 4th Generation wRfx: NONREACTIVE

## 2016-12-04 LAB — RHEUMATOID FACTOR: RHEUMATOID FACTOR: 18.6 [IU]/mL — AB (ref 0.0–13.9)

## 2016-12-04 MED ORDER — IOPAMIDOL (ISOVUE-300) INJECTION 61%
INTRAVENOUS | Status: AC
  Administered 2016-12-04: 100 mL
  Filled 2016-12-04: qty 100

## 2016-12-04 MED ORDER — INSULIN GLARGINE 100 UNIT/ML ~~LOC~~ SOLN
5.0000 [IU] | Freq: Every day | SUBCUTANEOUS | Status: DC
  Administered 2016-12-04 – 2016-12-05 (×2): 5 [IU] via SUBCUTANEOUS
  Filled 2016-12-04 (×2): qty 0.05

## 2016-12-04 MED ORDER — MAGNESIUM SULFATE 2 GM/50ML IV SOLN
2.0000 g | Freq: Once | INTRAVENOUS | Status: AC
  Administered 2016-12-04: 2 g via INTRAVENOUS
  Filled 2016-12-04: qty 50

## 2016-12-04 NOTE — Progress Notes (Signed)
Text paged provider to ask if we should have wound care nurse look at several wounds

## 2016-12-04 NOTE — Progress Notes (Signed)
Family Medicine Teaching Service Daily Progress Note Intern Pager: [phone]  Patient name: Christine [name] Medical record number: [id number] Date of birth: [birth year] Age: 46 y.o. Gender: female  Primary Care Provider: Steve Rattler, DO Consultants: None Code Status: Full  Pt Overview and Major Events to Date:  Christine [name] is a 46 y.o. female presenting with 3 day history of right arm swelling, pain, purulent wound drainage. PMH is significant for chronic alcoholic pancreatitis, DMII, HLD, tobacco abuse, depression, HTN, Rash  Assessment and Plan:  RIGHT Arm Cellulitis, meeting sepsis criteria: Initially met sepsis criteria in ED, Vitals stable on admission, patient afebrile. Physical exam showing erythema and swelling on right arm, mostly forearm with purulent discharge from punch biopsy site on forearm. Decreased ability to grip with right hand likely secondary to pain. Notable labs include a leukocytosis to 20 with a neutrophil predominance, creatinine 1.46, sodium 126, chloride 94, bicarbonate 19, glucose 408. Lactic acid 1.84 > 2.42. Cellulitis likely from poor wound healing after having punch biopsy of rash on her arm. Poor wound healing likely 2/2 recent prednisone course and smoking. Less likely necrotizing fasciitis given no subcutaneous emphysema however we will need to keep this on our differential due to difficulty moving right wrist. Status post 2 L of normal saline in ED. qSOFA score of 0. UA >500 glucose. X-ray of right arm;  elbow, forearm, wrist, hand showed no osteos leisons, some effusion around the elbow. -admit to FPTS, MedSurg,  -IV vancomycin and Zosyn per pharmacy, day 1 -taper prednisone dosage to 20 mg daily -Blood cultures x2, pending -Tylenol PRN for mild pain and Oxycodone IR 5 mg PRN for moderate to severe pain -Cardiac monitoring -We'll also x-ray left lower leg as there is another lesion here from the biopsy that is not healed -maintenance IV fluids -wound  culture -if arm does not improve, consider CT arm to r/o septic joint -order urine pregnancy   Lactic Acidosis and non anion gap metabolic acidosis: Initial lactic acid 1.4 > 2.4 > 1.3. Bicarb 19 > 20. Likely due to sepsis -improved  -IV fluids as above  AKI. Cr 1.46. Up from 1.16 on 08/03. Likely prerenal etiology given sepsis picture and decreased by mouth intake -UA >500. Prob from dehydration 2/2 polyurea associated w/ hyperglycemia -trend Cr after fluid resucciation -Can consider getting a FeNa if creatinine does not improve  Chronic diffuse rash: Has had a rash of unknown etiology for about 10 months. Has been seen at PCP office numerous times. Previous ANA and ESR within normal limits. Biopsies were taken a few weeks ago of her right arm and left leg. Has been on prednisone for about 3 weeks without any improvement per patient report. Of note patient does have various marks and scrapes on her skin of unknown etiology. Patient possibly falling at home due to alcohol abuse. -RF elevated at 16.6, ESR up at 40 -Negative RPR, HIV -Can try topical steroid if patient has any itchiness  Hypertension: Normotensive currently. -hold home BP meds (lisinopril-HCTZ) -can restart in becomes hypertensive  Hyponatremia. Na 126 on admission, corrected to 133 when considering glucose -Daily BMP  Hypomagnasemia - 1.39m.  -replete per protocol  DMII and hyperglycemia: last A1C 6.0. Currently not on insulin, states she was taken off of this because her A1c was so bad. Likely has hyperglycemia secondary to prednisone use and infection. S/p 8 units of novolog in ED.  -SSI-moderate -Carb modified diet -may add lantis based on insulin use over past  24 hrs  H/o Alcohol Abuse. Pt drinks 5 beers a day. Denies drinking alcohol on day of admission -CIWA protocol -thiamine and folate  Tobacco Abuse -nicotine patch prn  Thrush: Seen on physical exam -Nystatin mouthwash -neg HIV  FEN/GI:  Carb modified diet, mIVF NS Prophylaxis: LMWH  Disposition: Admit to FPTS, med surg, attending Dr. Gwendlyn Deutscher Objective: Temp:  [98.8 F (37.1 C)-100.8 F (38.2 C)] 98.8 F (37.1 C) (08/08 1259) Pulse Rate:  [84-104] 86 (08/08 1259) Resp:  [16-20] 16 (08/08 1259) BP: (113-159)/(62-85) 133/74 (08/08 1259) SpO2:  [97 %-100 %] 100 % (08/08 1259) Weight:  [154 lb (69.9 kg)] 154 lb (69.9 kg) (08/07 1447) Physical Exam: General: mild distress Cardiovascular: rrr, no mrg Respiratory: CTAB, no increased work of breathing Abdomen: soft non-tender, nondistended Extremities: right arm still swollen to   Laboratory:  Recent Labs Lab 12/03/16 1236 12/04/16 0329  WBC 20.0* 19.6*  HGB 10.1* 9.4*  HCT 30.1* 28.1*  PLT 169 152    Recent Labs Lab 11/29/16 1651 12/03/16 1236 12/04/16 0329  NA 133* 126* 128*  K 5.1 4.3 3.9  CL 96 94* 99*  CO2 17* 19* 20*  BUN 17 21* 20  CREATININE 1.16* 1.46* 1.20*  CALCIUM 9.1 9.0 8.4*  PROT 7.0 6.4* 5.3*  BILITOT 0.3 0.9 0.8  ALKPHOS 86 64 54  ALT 15 13* 10*  AST 22 13* 11*  GLUCOSE 441* 408* 243*      Imaging/Diagnostic Tests: Dg Elbow Complete Right (3+view)  Result Date: 12/04/2016 CLINICAL DATA:  Acute onset of right posterior elbow pain. Initial encounter. EXAM: RIGHT ELBOW - COMPLETE 3+ VIEW COMPARISON:  None. FINDINGS: There is no evidence of fracture or dislocation. No osseous erosions are seen to suggest osteomyelitis. The visualized joint spaces are preserved. An apparent elbow joint effusion is noted. Dorsal soft tissue swelling is noted at the elbow. IMPRESSION: 1. No definite evidence of fracture or dislocation. No osseous erosions seen to suggest osteomyelitis. 2. Apparent elbow joint effusion noted. 3. Dorsal soft tissue swelling at the elbow. Electronically Signed   By: Garald Balding M.D.   On: 12/04/2016 03:01   Dg Forearm Right  Result Date: 12/03/2016 CLINICAL DATA:  46 year old female with a history of wound infection EXAM:  RIGHT FOREARM - 2 VIEW COMPARISON:  None. FINDINGS: No acute bony abnormality. No erosive changes. No periosteal reaction. No radiopaque foreign body. Nonspecific soft tissue swelling of the proximal forearm. Vascular calcifications. IMPRESSION: Negative for acute bony abnormality. Nonspecific soft tissue swelling of the proximal forearm. Vascular calcifications. Electronically Signed   By: Corrie Mckusick D.O.   On: 12/03/2016 17:05   Dg Wrist Complete Right  Result Date: 12/04/2016 CLINICAL DATA:  Acute onset of right wrist swelling and pain. Initial encounter. EXAM: RIGHT WRIST - COMPLETE 3+ VIEW COMPARISON:  None. FINDINGS: There is no evidence of fracture or dislocation. No osseous erosions are seen to suggest osteomyelitis. The carpal rows are intact, and demonstrate normal alignment. The joint spaces are preserved. Mild diffuse soft tissue swelling is noted about the wrist. Scattered vascular calcifications are seen. IMPRESSION: 1. No evidence of fracture or dislocation. No osseous erosions are seen to suggest osteomyelitis. 2. Mild diffuse soft tissue swelling about the wrist. 3. Scattered vascular calcifications seen. Electronically Signed   By: Garald Balding M.D.   On: 12/04/2016 03:02   Dg Tibia/fibula Left  Result Date: 12/04/2016 CLINICAL DATA:  Open wounds at the left lower leg. Initial encounter. EXAM: LEFT TIBIA AND FIBULA -  2 VIEW COMPARISON:  Left knee radiograph performed 01/17/2015 FINDINGS: There is no evidence of fracture or dislocation. The tibia and fibula appear intact. The ankle mortise is grossly unremarkable in appearance. The knee joint is unremarkable. No knee joint effusion is identified. Known soft tissue wounds are not well characterized on radiograph. IMPRESSION: No evidence of fracture or dislocation. Electronically Signed   By: Garald Balding M.D.   On: 12/04/2016 03:05   Dg Hand 2 View Right  Result Date: 12/04/2016 CLINICAL DATA:  Acute onset of right hand pain and  swelling. Initial encounter. EXAM: RIGHT HAND - 2 VIEW COMPARISON:  Right forearm radiographs performed earlier today at 4:48 p.m. FINDINGS: There is no evidence of fracture or dislocation. No osseous erosions are seen. The joint spaces are preserved. The carpal rows are intact, and demonstrate normal alignment. The soft tissues are unremarkable in appearance. IMPRESSION: No evidence of fracture or dislocation.  No osseous erosions seen. Electronically Signed   By: Garald Balding M.D.   On: 12/04/2016 03:04    Bonnita Hollow, MD 12/04/2016, 2:04 PM PGY-1, [place] Intern pager: [phone], text pages welcome

## 2016-12-04 NOTE — Consult Note (Signed)
Possible superficial infection of a punch biopsy site to the R arm I am awaiting CT results, if drainable abscess will plan for OR drainage, if not would recommend continued ABx treatment  Formal consult to follow

## 2016-12-04 NOTE — Consult Note (Signed)
ORTHOPAEDIC CONSULTATION  REQUESTING PHYSICIAN: Leeanne Rio, MD  Chief Complaint: RUE pain  HPI: Christine [name] is a 46 y.o. female who complains of  1 week of pain in the right upper extremity. She had a punch biopsy in her right dorsal forearm 4 weeks ago. She started to have pain swelling and redness around this a week ago. She reports it is improved today on antibiotics. She denies wrist or elbow pain.  Past Medical History:  Diagnosis Date  . Alcohol abuse   . Chronic pancreatitis ([place])   . Diabetes mellitus   . Hypertension   . Substance abuse    History reviewed. No pertinent surgical history. Social History   Social History  . Marital status: Single    Spouse name: N/A  . Number of children: 3  . Years of education: N/A   Occupational History  . personal care assistant    Social History Main Topics  . Smoking status: Current Every Day Smoker    Packs/day: 0.50    Years: 20.00    Types: Cigarettes  . Smokeless tobacco: Never Used     Comment: working on quitting  . Alcohol use 0.0 oz/week     Comment: stopped drinking beers a month ago  . Drug use: No  . Sexual activity: Not Asked   Other Topics Concern  . None   Social History Narrative   Single, 2 sons one daughter. She is working as a Optician, dispensing. She is to drink several beers a day but stopped recently. It is September 2014. 4 caffeinated beverages daily.   No family history on file. No Known Allergies Prior to Admission medications   Medication Sig Start Date End Date Taking? Authorizing Provider  lisinopril-hydrochlorothiazide (ZESTORETIC) 20-12.5 MG tablet Take 1 tablet by mouth daily. Patient taking differently: Take 1 tablet by mouth at bedtime.  11/08/16  Yes Steve Rattler, DO  predniSONE (DELTASONE) 20 MG tablet Take 2 tablets (40 mg total) by mouth daily with breakfast. 11/15/16  Yes Steve Rattler, DO  ACCU-CHEK FASTCLIX LANCETS MISC Check sugar 5 x daily  12/12/14   Leone Brand, MD  Blood Glucose Monitoring Suppl (ACCU-CHEK ADVANTAGE DIABETES) kit Use as instructed 10/28/13   Raoul Pitch, Renee A, DO  glucose blood (ACCU-CHEK AVIVA) test strip Check sugar 6 x daily 05/10/15   [place], York Ram, MD  glucose blood test strip Check sugar 5x daily 05/03/15   Leone Brand, MD   Dg Elbow Complete Right (3+view)  Result Date: 12/04/2016 CLINICAL DATA:  Acute onset of right posterior elbow pain. Initial encounter. EXAM: RIGHT ELBOW - COMPLETE 3+ VIEW COMPARISON:  None. FINDINGS: There is no evidence of fracture or dislocation. No osseous erosions are seen to suggest osteomyelitis. The visualized joint spaces are preserved. An apparent elbow joint effusion is noted. Dorsal soft tissue swelling is noted at the elbow. IMPRESSION: 1. No definite evidence of fracture or dislocation. No osseous erosions seen to suggest osteomyelitis. 2. Apparent elbow joint effusion noted. 3. Dorsal soft tissue swelling at the elbow. Electronically Signed   By: Garald Balding M.D.   On: 12/04/2016 03:01   Dg Forearm Right  Result Date: 12/03/2016 CLINICAL DATA:  46 year old female with a history of wound infection EXAM: RIGHT FOREARM - 2 VIEW COMPARISON:  None. FINDINGS: No acute bony abnormality. No erosive changes. No periosteal reaction. No radiopaque foreign body. Nonspecific soft tissue swelling of the proximal forearm. Vascular calcifications. IMPRESSION: Negative  for acute bony abnormality. Nonspecific soft tissue swelling of the proximal forearm. Vascular calcifications. Electronically Signed   By: Corrie Mckusick D.O.   On: 12/03/2016 17:05   Dg Wrist Complete Right  Result Date: 12/04/2016 CLINICAL DATA:  Acute onset of right wrist swelling and pain. Initial encounter. EXAM: RIGHT WRIST - COMPLETE 3+ VIEW COMPARISON:  None. FINDINGS: There is no evidence of fracture or dislocation. No osseous erosions are seen to suggest osteomyelitis. The carpal rows are intact, and demonstrate  normal alignment. The joint spaces are preserved. Mild diffuse soft tissue swelling is noted about the wrist. Scattered vascular calcifications are seen. IMPRESSION: 1. No evidence of fracture or dislocation. No osseous erosions are seen to suggest osteomyelitis. 2. Mild diffuse soft tissue swelling about the wrist. 3. Scattered vascular calcifications seen. Electronically Signed   By: Garald Balding M.D.   On: 12/04/2016 03:02   Dg Tibia/fibula Left  Result Date: 12/04/2016 CLINICAL DATA:  Open wounds at the left lower leg. Initial encounter. EXAM: LEFT TIBIA AND FIBULA - 2 VIEW COMPARISON:  Left knee radiograph performed 01/17/2015 FINDINGS: There is no evidence of fracture or dislocation. The tibia and fibula appear intact. The ankle mortise is grossly unremarkable in appearance. The knee joint is unremarkable. No knee joint effusion is identified. Known soft tissue wounds are not well characterized on radiograph. IMPRESSION: No evidence of fracture or dislocation. Electronically Signed   By: Garald Balding M.D.   On: 12/04/2016 03:05   Dg Hand 2 View Right  Result Date: 12/04/2016 CLINICAL DATA:  Acute onset of right hand pain and swelling. Initial encounter. EXAM: RIGHT HAND - 2 VIEW COMPARISON:  Right forearm radiographs performed earlier today at 4:48 p.m. FINDINGS: There is no evidence of fracture or dislocation. No osseous erosions are seen. The joint spaces are preserved. The carpal rows are intact, and demonstrate normal alignment. The soft tissues are unremarkable in appearance. IMPRESSION: No evidence of fracture or dislocation.  No osseous erosions seen. Electronically Signed   By: Garald Balding M.D.   On: 12/04/2016 03:04    Positive ROS: All other systems have been reviewed and were otherwise negative with the exception of those mentioned in the HPI and as above.  Labs cbc  Recent Labs  12/03/16 1236 12/04/16 0329  WBC 20.0* 19.6*  HGB 10.1* 9.4*  HCT 30.1* 28.1*  PLT 169 152     Labs inflam  Recent Labs  12/03/16 1819  CRP 25.1*    Labs coag No results for input(s): INR, PTT in the last 72 hours.  Invalid input(s): PT   Recent Labs  12/03/16 1236 12/04/16 0329  NA 126* 128*  K 4.3 3.9  CL 94* 99*  CO2 19* 20*  GLUCOSE 408* 243*  BUN 21* 20  CREATININE 1.46* 1.20*  CALCIUM 9.0 8.4*    Physical Exam: Vitals:   12/04/16 0517 12/04/16 1259  BP: 131/67 133/74  Pulse: 97 86  Resp: 18 16  Temp: 99.7 F (37.6 C) 98.8 F (37.1 C)   General: Alert, no acute distress Cardiovascular: No pedal edema Respiratory: No cyanosis, no use of accessory musculature GI: No organomegaly, abdomen is soft and non-tender Skin: No lesions in the area of chief complaint other than those listed below in MSK exam.  Neurologic: Sensation intact distally save for the below mentioned MSK exam Psychiatric: Patient is competent for consent with normal mood and affect Lymphatic: No axillary or cervical lymphadenopathy  MUSCULOSKELETAL:  Right upper extremity: Painless range  of motion at wrist and elbow tenderness across her forearm with some erythema and swelling it is improved from marks from yesterday. She is neurovascularly intact to the hand. Other extremities are atraumatic with painless ROM and NVI.  Assessment: Cellulitis right upper extremity  Plan: No radiographic or exam findings to indicate abscess. No indication for surgical intervention at this time Continue medical management  follow-up with may as needed if she does not improve Please call with any further questions   Renette Butters, MD Cell [phone]   12/04/2016 9:10 PM

## 2016-12-04 NOTE — Progress Notes (Signed)
Order for wound care placed. Thank you.

## 2016-12-04 NOTE — Progress Notes (Signed)
Inpatient Diabetes Program Recommendations  AACE/ADA: New Consensus Statement on Inpatient Glycemic Control (2015)  Target Ranges:  Prepandial:   less than 140 mg/dL      Peak postprandial:   less than 180 mg/dL (1-2 hours)      Critically ill patients:  140 - 180 mg/dL   Lab Results  Component Value Date   GLUCAP 261 (H) 12/04/2016   HGBA1C 6.0 11/05/2016    Review of Glycemic Control Results for Christine [name], Christine [name] (MRN [id number]) as of 12/04/2016 10:50  Ref. Range 12/03/2016 20:43 12/04/2016 02:47 12/04/2016 07:54 12/04/2016 10:00  Glucose-Capillary Latest Ref Range: 65 - 99 mg/dL 410 (H) 232 (H) 255 (H) 261 (H)   Diabetes history: DM2 Outpatient Diabetes medications: Lantus 6 units qd (states started approx. 4 weeks ago) Current orders for Inpatient glycemic control: Novolog correction 0-15 units tid  Inpatient Diabetes Program Recommendations:  Noted hyperglycemia. Please consider: -Lantus 6 units qd -Add Novolog correction 0-5 units hs Will follow.  Thank you, Christine [name]. Christine Nohr, RN, MSN, CDE  Diabetes Coordinator Inpatient Glycemic Control Team Team Pager [phone] (8am-5pm) 12/04/2016 10:54 AM

## 2016-12-05 ENCOUNTER — Encounter (HOSPITAL_COMMUNITY): Payer: Self-pay | Admitting: General Practice

## 2016-12-05 LAB — CBC
HCT: 26.3 % — ABNORMAL LOW (ref 36.0–46.0)
Hemoglobin: 8.8 g/dL — ABNORMAL LOW (ref 12.0–15.0)
MCH: 30.4 pg (ref 26.0–34.0)
MCHC: 33.5 g/dL (ref 30.0–36.0)
MCV: 91 fL (ref 78.0–100.0)
PLATELETS: 157 10*3/uL (ref 150–400)
RBC: 2.89 MIL/uL — ABNORMAL LOW (ref 3.87–5.11)
RDW: 14.6 % (ref 11.5–15.5)
WBC: 20.8 10*3/uL — ABNORMAL HIGH (ref 4.0–10.5)

## 2016-12-05 LAB — BASIC METABOLIC PANEL
Anion gap: 10 (ref 5–15)
BUN: 14 mg/dL (ref 6–20)
CALCIUM: 8.8 mg/dL — AB (ref 8.9–10.3)
CO2: 21 mmol/L — AB (ref 22–32)
Chloride: 101 mmol/L (ref 101–111)
Creatinine, Ser: 1.05 mg/dL — ABNORMAL HIGH (ref 0.44–1.00)
GFR calc Af Amer: >60 mL/min (ref >60)
GLUCOSE: 217 mg/dL — AB (ref 65–99)
Potassium: 3.8 mmol/L (ref 3.5–5.1)
Sodium: 132 mmol/L — ABNORMAL LOW (ref 135–145)

## 2016-12-05 LAB — GLUCOSE, CAPILLARY
GLUCOSE-CAPILLARY: 263 mg/dL — AB (ref 65–99)
GLUCOSE-CAPILLARY: 352 mg/dL — AB (ref 65–99)
Glucose-Capillary: 200 mg/dL — ABNORMAL HIGH (ref 65–99)
Glucose-Capillary: 225 mg/dL — ABNORMAL HIGH (ref 65–99)

## 2016-12-05 LAB — PREGNANCY, URINE: Preg Test, Ur: NEGATIVE

## 2016-12-05 NOTE — Progress Notes (Signed)
Patients right arm more swollen and warm than yesterday. Text paged provider to see if ultrasound is appropriate.

## 2016-12-05 NOTE — Consult Note (Addendum)
WOC consult requested for arms and legs; affected areas are from an unknown etiology.   Ortho service has assessed right anterior arm full thickness wound and requested that it be left open without a dressing, according to the EMR.  .3X.3X.2cm with yellow wound bed, mod amt yellow drainage, no odor.  Pt states this is a previous biopsy site which is not healing.  Right forearm with generalized edema and erythremia.  Posterior arm with dark purple patchy areas with intact skin. Posterior elbow with partial thickness skin loss; 3X1X.1cm, red and moist, no odor, small amt tan drainage. Bilat legs with patchy areas of dry scabs and partial thickness wounds which have shallow dry yellow wound beds, no open wounds or drainage.  Foam dressings to protect from further injury and promote healing.  Discussed plan of care with pt and she verbalized understanding. Please refer to ortho service for further plan of care to right arm. Please re-consult if further assistance is needed.  Thank-you,  Julien Girt MSN, [place], [place], [place], [place]

## 2016-12-05 NOTE — Progress Notes (Signed)
Family Medicine Teaching Service Daily Progress Note Intern Pager: [phone]  Patient name: Christine [name] Medical record number: [id number] Date of birth: 01/15/1971 Age: 46 y.o. Gender: female  Primary Care Provider: Steve Rattler, DO Consultants: None Code Status: Full  Pt Overview and Major Events to Date:  Christine [name] is a 46 y.o. female presenting with 3 day history of right arm swelling, pain, purulent wound drainage. PMH is significant for chronic alcoholic pancreatitis, DMII, HLD, tobacco abuse, depression, HTN, Rash  Assessment and Plan:  RIGHT Arm Cellulitis, meeting sepsis criteria: worsening,  Initially met sepsis criteria in ED, Vitals stable on admission, patient afebrile. Physical exam showing erythema and swelling on right arm, mostly forearm with purulent discharge from punch biopsy site on forearm. Decreased ability to grip with right hand likely secondary to pain. Notable labs include a leukocytosis to 20 with a neutrophil predominance, creatinine 1.46, sodium 126, chloride 94, bicarbonate 19, glucose 408. Lactic acid 1.84 > 2.42. Cellulitis likely from poor wound healing after having punch biopsy of rash on her arm. Poor wound healing likely 2/2 recent prednisone course and smoking. Less likely necrotizing fasciitis given no subcutaneous emphysema however we will need to keep this on our differential due to difficulty moving right wrist. Status post 2 L of normal saline in ED. qSOFA score of 0. UA >500 glucose. X-ray of right arm;  elbow, forearm, wrist, hand showed no osteos leisons, some effusion around the elbow.  -worsening edema around elbow, CT arm showed small effusion, concern for septic joint -joint aspiration contraindicated given overlying cellulitis -CT right arm showed cellulitis from mid to distal third of the right arm extending into the forearm. Small elbow joint effusion. -ortho consulted  -no surgical indications at this time  -continue broad spectrum  antibiotics -IV vancomycin and Zosyn per pharmacy, started on 08/07 -taper prednisone dosage to 20 mg daily -Blood cultures x2, pending -Tylenol PRN for mild pain and Oxycodone IR 5 mg PRN for moderate to severe pain -Cardiac monitoring -We'll also x-ray left lower leg as there is another lesion here from the biopsy that is not healed -maintenance IV fluids -wound culture -urine pregnancy, pending  Lactic Acidosis and non anion gap metabolic acidosis: resolved Initial lactic acid 1.4 > 2.4 > 1.3. Bicarb 19 > 20. Likely due to sepsis -IV fluids as above  AKI. Cr 1.46 on admission. Likely prerenal etiology given sepsis picture and decreased by mouth intake -UA >500. Prob from dehydration 2/2 polyurea associated w/ hyperglycemia -Cr elevated to near admission -Continue abx -Can consider getting a FeNa if creatinine does not improve  Chronic diffuse rash: Has had a rash of unknown etiology for about 10 months. Has been seen at PCP office numerous times. Previous ANA and ESR within normal limits. Biopsies were taken a few weeks ago of her right arm and left leg. Has been on prednisone for about 3 weeks without any improvement per patient report. Of note patient does have various marks and scrapes on her skin of unknown etiology. Patient possibly falling at home due to alcohol abuse. -RF elevated at 16.6, ESR up at 40 -Negative RPR, HIV -Can try topical steroid if patient has any itchiness  Hypertension: Normotensive currently. -hold home BP meds (lisinopril-HCTZ) -can restart in becomes hypertensive  Hyponatremia: stable,Na 126 on admission, corrected to 133 when considering glucose -Daily BMP  Hypomagnasemia - 1.62m on admission -repleted per protocol  DMII and hyperglycemia: last A1C 6.0. Currently not on insulin, states she was  taken off of this because her A1c was so bad. Likely has hyperglycemia secondary to prednisone use and infection. S/p 8 units of novolog in ED.   -SSI-moderate -Carb modified diet -may add lantis based on insulin use over past 24 hrs  H/o Alcohol Abuse. Pt drinks 5 beers a day. Denies drinking alcohol on day of admission -CIWA protocol, score 0 since admit -thiamine and folate  Tobacco Abuse -nicotine patch prn  Thrush: Seen on physical exam -Nystatin mouthwash -neg HIV  FEN/GI: Carb modified diet, mIVF NS Prophylaxis: LMWH  Disposition: Admit to FPTS, med surg,  Objective: Temp:  [98 F (36.7 C)-98.8 F (37.1 C)] 98.2 F (36.8 C) (08/08 2154) Pulse Rate:  [79-92] 79 (08/09 0522) Resp:  [16-18] 18 (08/09 0522) BP: (117-133)/(62-74) 118/63 (08/09 0522) SpO2:  [99 %-100 %] 99 % (08/09 0522) Physical Exam: General: mild distress Cardiovascular: rrr, no mrg Respiratory: CTAB, no increased work of breathing Abdomen: soft non-tender, nondistended Extremities: right arm swollen up from mid upper arm to for arm, radial pulse 2+ bilaterally, seems more swollen around elbow, upper layer of skin loss from removal of wound dressing on upper arm  Laboratory:  Recent Labs Lab 12/03/16 1236 12/04/16 0329 12/05/16 0422  WBC 20.0* 19.6* 20.8*  HGB 10.1* 9.4* 8.8*  HCT 30.1* 28.1* 26.3*  PLT 169 152 157    Recent Labs Lab 11/29/16 1651 12/03/16 1236 12/04/16 0329 12/05/16 0422  NA 133* 126* 128* 132*  K 5.1 4.3 3.9 3.8  CL 96 94* 99* 101  CO2 17* 19* 20* 21*  BUN 17 21* 20 14  CREATININE 1.16* 1.46* 1.20* 1.05*  CALCIUM 9.1 9.0 8.4* 8.8*  PROT 7.0 6.4* 5.3*  --   BILITOT 0.3 0.9 0.8  --   ALKPHOS 86 64 54  --   ALT 15 13* 10*  --   AST 22 13* 11*  --   GLUCOSE 441* 408* 243* 217*      Imaging/Diagnostic Tests: Dg Elbow Complete Right (3+view)  Result Date: 12/04/2016 CLINICAL DATA:  Acute onset of right posterior elbow pain. Initial encounter. EXAM: RIGHT ELBOW - COMPLETE 3+ VIEW COMPARISON:  None. FINDINGS: There is no evidence of fracture or dislocation. No osseous erosions are seen to suggest  osteomyelitis. The visualized joint spaces are preserved. An apparent elbow joint effusion is noted. Dorsal soft tissue swelling is noted at the elbow. IMPRESSION: 1. No definite evidence of fracture or dislocation. No osseous erosions seen to suggest osteomyelitis. 2. Apparent elbow joint effusion noted. 3. Dorsal soft tissue swelling at the elbow. Electronically Signed   By: Garald Balding M.D.   On: 12/04/2016 03:01   Dg Forearm Right  Result Date: 12/03/2016 CLINICAL DATA:  46 year old female with a history of wound infection EXAM: RIGHT FOREARM - 2 VIEW COMPARISON:  None. FINDINGS: No acute bony abnormality. No erosive changes. No periosteal reaction. No radiopaque foreign body. Nonspecific soft tissue swelling of the proximal forearm. Vascular calcifications. IMPRESSION: Negative for acute bony abnormality. Nonspecific soft tissue swelling of the proximal forearm. Vascular calcifications. Electronically Signed   By: Corrie Mckusick D.O.   On: 12/03/2016 17:05   Dg Wrist Complete Right  Result Date: 12/04/2016 CLINICAL DATA:  Acute onset of right wrist swelling and pain. Initial encounter. EXAM: RIGHT WRIST - COMPLETE 3+ VIEW COMPARISON:  None. FINDINGS: There is no evidence of fracture or dislocation. No osseous erosions are seen to suggest osteomyelitis. The carpal rows are intact, and demonstrate normal alignment. The joint  spaces are preserved. Mild diffuse soft tissue swelling is noted about the wrist. Scattered vascular calcifications are seen. IMPRESSION: 1. No evidence of fracture or dislocation. No osseous erosions are seen to suggest osteomyelitis. 2. Mild diffuse soft tissue swelling about the wrist. 3. Scattered vascular calcifications seen. Electronically Signed   By: Garald Balding M.D.   On: 12/04/2016 03:02   Dg Tibia/fibula Left  Result Date: 12/04/2016 CLINICAL DATA:  Open wounds at the left lower leg. Initial encounter. EXAM: LEFT TIBIA AND FIBULA - 2 VIEW COMPARISON:  Left knee  radiograph performed 01/17/2015 FINDINGS: There is no evidence of fracture or dislocation. The tibia and fibula appear intact. The ankle mortise is grossly unremarkable in appearance. The knee joint is unremarkable. No knee joint effusion is identified. Known soft tissue wounds are not well characterized on radiograph. IMPRESSION: No evidence of fracture or dislocation. Electronically Signed   By: Garald Balding M.D.   On: 12/04/2016 03:05   Dg Hand 2 View Right  Result Date: 12/04/2016 CLINICAL DATA:  Acute onset of right hand pain and swelling. Initial encounter. EXAM: RIGHT HAND - 2 VIEW COMPARISON:  Right forearm radiographs performed earlier today at 4:48 p.m. FINDINGS: There is no evidence of fracture or dislocation. No osseous erosions are seen. The joint spaces are preserved. The carpal rows are intact, and demonstrate normal alignment. The soft tissues are unremarkable in appearance. IMPRESSION: No evidence of fracture or dislocation.  No osseous erosions seen. Electronically Signed   By: Garald Balding M.D.   On: 12/04/2016 03:04    Bonnita Hollow, MD 12/05/2016, 9:15 AM PGY-1, [place] Intern pager: [phone], text pages welcome

## 2016-12-05 NOTE — Progress Notes (Signed)
No wound care orders. Cleansed and applied foam dressing to the wound on the right forearm  and gauze to the elbow. Cleansed the wound on the right leg and applied foam dressing.  Cleaned the site on leg leg and applied gauze and wrapped in Kerlix for protection.  Will continue to monitor the patient and notify as needed

## 2016-12-05 NOTE — Progress Notes (Signed)
Spoke with Dr. Percell Miller. He re-evaluated Christine [name] arm and feels that surgery is not warranted at this time. His plan is to continue abx and remove bandage over arm to allow the wound to drain. He is concerned that a joint aspiration could possibly introduce infection in the elbow. I appreciate the excellent care from orthopedics.

## 2016-12-06 ENCOUNTER — Telehealth: Payer: Self-pay | Admitting: Family Medicine

## 2016-12-06 LAB — CBC
HEMATOCRIT: 24 % — AB (ref 36.0–46.0)
Hemoglobin: 8.1 g/dL — ABNORMAL LOW (ref 12.0–15.0)
MCH: 31.3 pg (ref 26.0–34.0)
MCHC: 33.8 g/dL (ref 30.0–36.0)
MCV: 92.7 fL (ref 78.0–100.0)
PLATELETS: 160 10*3/uL (ref 150–400)
RBC: 2.59 MIL/uL — ABNORMAL LOW (ref 3.87–5.11)
RDW: 14.9 % (ref 11.5–15.5)
WBC: 18.8 10*3/uL — ABNORMAL HIGH (ref 4.0–10.5)

## 2016-12-06 LAB — BASIC METABOLIC PANEL
Anion gap: 7 (ref 5–15)
BUN: 13 mg/dL (ref 6–20)
CO2: 21 mmol/L — AB (ref 22–32)
CREATININE: 1.21 mg/dL — AB (ref 0.44–1.00)
Calcium: 8.6 mg/dL — ABNORMAL LOW (ref 8.9–10.3)
Chloride: 100 mmol/L — ABNORMAL LOW (ref 101–111)
GFR calc non Af Amer: 53 mL/min — ABNORMAL LOW (ref >60)
Glucose, Bld: 288 mg/dL — ABNORMAL HIGH (ref 65–99)
POTASSIUM: 3.7 mmol/L (ref 3.5–5.1)
Sodium: 128 mmol/L — ABNORMAL LOW (ref 135–145)

## 2016-12-06 LAB — GLUCOSE, CAPILLARY
GLUCOSE-CAPILLARY: 281 mg/dL — AB (ref 65–99)
GLUCOSE-CAPILLARY: 196 mg/dL — AB (ref 65–99)
Glucose-Capillary: 244 mg/dL — ABNORMAL HIGH (ref 65–99)
Glucose-Capillary: 302 mg/dL — ABNORMAL HIGH (ref 65–99)

## 2016-12-06 LAB — URIC ACID: URIC ACID, SERUM: 2.1 mg/dL — AB (ref 2.3–6.6)

## 2016-12-06 MED ORDER — NICOTINE 7 MG/24HR TD PT24
7.0000 mg | MEDICATED_PATCH | Freq: Every day | TRANSDERMAL | Status: DC
  Administered 2016-12-07 – 2016-12-11 (×6): 7 mg via TRANSDERMAL
  Filled 2016-12-06 (×6): qty 1

## 2016-12-06 MED ORDER — INSULIN GLARGINE 100 UNIT/ML ~~LOC~~ SOLN
10.0000 [IU] | Freq: Every day | SUBCUTANEOUS | Status: DC
  Administered 2016-12-06: 10 [IU] via SUBCUTANEOUS
  Filled 2016-12-06: qty 0.1

## 2016-12-06 MED ORDER — WHITE PETROLATUM GEL
Status: DC | PRN
  Filled 2016-12-06: qty 1

## 2016-12-06 MED ORDER — INSULIN ASPART 100 UNIT/ML ~~LOC~~ SOLN
0.0000 [IU] | Freq: Three times a day (TID) | SUBCUTANEOUS | Status: DC
  Administered 2016-12-06: 4 [IU] via SUBCUTANEOUS
  Administered 2016-12-07: 7 [IU] via SUBCUTANEOUS
  Administered 2016-12-07: 15 [IU] via SUBCUTANEOUS
  Administered 2016-12-07: 3 [IU] via SUBCUTANEOUS
  Administered 2016-12-08: 15 [IU] via SUBCUTANEOUS
  Administered 2016-12-08: 7 [IU] via SUBCUTANEOUS
  Administered 2016-12-08: 3 [IU] via SUBCUTANEOUS
  Administered 2016-12-09: 4 [IU] via SUBCUTANEOUS
  Administered 2016-12-09: 11 [IU] via SUBCUTANEOUS
  Administered 2016-12-09: 4 [IU] via SUBCUTANEOUS
  Administered 2016-12-10: 7 [IU] via SUBCUTANEOUS
  Administered 2016-12-10 – 2016-12-11 (×3): 4 [IU] via SUBCUTANEOUS
  Administered 2016-12-11: 3 [IU] via SUBCUTANEOUS

## 2016-12-06 NOTE — Telephone Encounter (Signed)
Pt is admitted at [facility], pt wants PCP to call her today. [phone]. ep

## 2016-12-06 NOTE — Telephone Encounter (Signed)
Called Ms. Cocker, she wants me to fill out some paperwork. I told her I would be happy to and asked her to get the form to me at her convenience. She is grateful for care by inpatient team. No other questions or concerns.  Lucila Maine, DO PGY-2, [place] [facility] 12/06/2016 11:04 AM

## 2016-12-06 NOTE — Progress Notes (Signed)
Inpatient Diabetes Program Recommendations  AACE/ADA: New Consensus Statement on Inpatient Glycemic Control (2015)  Target Ranges:  Prepandial:   less than 140 mg/dL      Peak postprandial:   less than 180 mg/dL (1-2 hours)      Critically ill patients:  140 - 180 mg/dL   Results for CANARY, FISTER (MRN [id number]) as of 12/06/2016 09:52  Ref. Range 12/05/2016 09:03 12/05/2016 12:19 12/05/2016 17:14 12/05/2016 22:21 12/06/2016 07:38  Glucose-Capillary Latest Ref Range: 65 - 99 mg/dL 200 (H) 225 (H) 263 (H) 352 (H) 244 (H)   Review of Glycemic Control  Current orders for Inpatient glycemic control: Lantus 5 units, Novolog Moderate Correction 0-15 units tid  Inpatient Diabetes Program Recommendations:    Steroids on board still. Hyperglycemia consistently in the 200's. Consider Novolog 3-5 units tid Meal coverage.  Thanks,  Tama Headings RN, MSN, [facility] Inpatient Diabetes Coordinator Team Pager [phone] (8a-5p)

## 2016-12-06 NOTE — Progress Notes (Signed)
Family Medicine Teaching Service Daily Progress Note Intern Pager: [phone]  Patient name: Christine [name] Medical record number: [id number] Date of birth: 08-12-1970 Age: 46 y.o. Gender: [name]  Primary Care Provider: Steve Rattler, DO Consultants: None Code Status: Full  Pt Overview and Major Events to Date:  Christine [name] is a 46 y.o. [name] presenting with 3 day history of right arm swelling, pain, purulent wound drainage. PMH is significant for chronic alcoholic pancreatitis, DMII, HLD, tobacco abuse, depression, HTN, Rash  Assessment and Plan:  RIGHT Arm Cellulitis, meeting sepsis criteria: improving, O/n, Bradycardic in 47s. BP 131/78.  Initially met sepsis criteria in ED, Vitals stable on admission, patient afebrile. Physical exam showing erythema and swelling on right arm, mostly forearm with purulent discharge from punch biopsy site on forearm. Decreased ability to grip with right hand likely secondary to pain. Notable labs include a leukocytosis to 20 with a neutrophil predominance, creatinine 1.46, sodium 126, chloride 94, bicarbonate 19, glucose 408. Lactic acid 1.84 > 2.42. Cellulitis likely from poor wound healing after having punch biopsy of rash on her arm. Poor wound healing likely 2/2 recent prednisone course and smoking. Less likely necrotizing fasciitis given no subcutaneous emphysema however we will need to keep this on our differential due to difficulty moving right wrist. Status post 2 L of normal saline in ED. qSOFA score of 0. UA >500 glucose. X-ray of right arm;  elbow, forearm, wrist, hand showed no osteos leisons, some effusion around the elbow. CT right arm showed cellulitis from mid to distal third of the right arm extending into the forearm. Small elbow joint effusion. -right elbow joint aspiration contraindicated given overlying cellulitis -ortho consulted  -open wound to allow to drain  -no surgical indications at this time  -continue broad spectrum  antibiotics -IV vancomycin and Zosyn per pharmacy, started on 08/07 -taper prednisone dosage to 20 mg daily -Blood cultures x2, no growth at 2 days -Tylenol PRN for mild pain and Oxycodone IR 5 mg PRN for moderate to severe pain -Cardiac monitoring - consider x-ray of left lower leg as there is another lesion here from the biopsy that is not healed -d/c maintenance IV fluids -wound cultures -urine pregnancy, negative -leukocytois improving, cont to monitor -Hemoglobin trended down slightly, cont to monitor  Lactic Acidosis and non anion gap metabolic acidosis: resolved Initial lactic acid 1.4 > 2.4 > 1.3. Bicarb 19 > 20. Likely due to sepsis -IV fluids as above  AKI. Improved. Cr 1.46 on admission. Likely prerenal etiology given sepsis picture and decreased by mouth intake -UA >500. Prob from dehydration 2/2 polyurea associated w/ hyperglycemia -trend Cr -Continue abx -Can consider getting a FeNa if creatinine does not improve  Chronic diffuse rash: Has had a rash of unknown etiology for about 10 months. Has been seen at PCP office numerous times. Previous ANA and ESR within normal limits. Biopsies were taken a few weeks ago of her right arm and left leg. Has been on prednisone for about 3 weeks without any improvement per patient report. Of note patient does have various marks and scrapes on her skin of unknown etiology. Patient possibly falling at home due to alcohol abuse. -RF elevated at 16.6, ESR up at 40 -Negative RPR, HIV -Can try topical steroid if patient has any itchiness  Hypertension: Normotensive currently. -hold home BP meds (lisinopril-HCTZ) -can restart in becomes hypertensive  Hyponatremia: stable, Na 126 on admission, corrected to 133 when considering glucose -Daily BMP  Hypomagnasemia - 1.33m on  admission -repleted per protocol  DMII and hyperglycemia: last A1C 6.0. Currently not on insulin, states she was taken off of this because her A1c was so bad.  Likely has hyperglycemia secondary to prednisone use and infection. S/p 8 units of novolog in ED.  -CBGs elevated to 300s -lantus 5 -increase to SSI-resistant -Carb modified diet  H/o Alcohol Abuse. Pt drinks 5 beers a day. Denies drinking alcohol on day of admission -CIWA protocol, score 0 since admit -thiamine and folate  Tobacco Abuse -nicotine patch prn  Thrush: Seen on physical exam, improved -Nystatin mouthwash -neg HIV  FEN/GI: Carb modified diet, Prophylaxis: LMWH  Disposition: Admit to FPTS, med surg,  Subjective: She reports her pain is better and she is able to use her right arm more. Two episodes of bradycardia o/n, closely following when she got her pain medication.   Objective: Temp:  [97.6 F (36.4 C)-98.2 F (36.8 C)] 97.9 F (36.6 C) (08/10 0518) Pulse Rate:  [56-70] 56 (08/10 0518) Resp:  [17-18] 18 (08/10 0518) BP: (117-131)/(70-78) 131/78 (08/10 0518) SpO2:  [99 %-100 %] 99 % (08/10 0518)  Physical Exam: General: mild distress Cardiovascular: rrr, no mrg Respiratory: CTAB, no increased work of breathing Abdomen: soft non-tender, nondistended Extremities: reduced swelling and erythema in right arm, still swollen from mid upper arm to forearm, radial pulse 2+ bilaterally, seems more swollen around elbow, purlent drainage from wound on right forearm  Laboratory:  Recent Labs Lab 12/04/16 0329 12/05/16 0422 12/06/16 0337  WBC 19.6* 20.8* 18.8*  HGB 9.4* 8.8* 8.1*  HCT 28.1* 26.3* 24.0*  PLT 152 157 160    Recent Labs Lab 11/29/16 1651  12/03/16 1236 12/04/16 0329 12/05/16 0422 12/06/16 0337  NA 133*  < > 126* 128* 132* 128*  K 5.1  --  4.3 3.9 3.8 3.7  CL 96  --  94* 99* 101 100*  CO2 17*  --  19* 20* 21* 21*  BUN 17  < > 21* '20 14 13  ' CREATININE 1.16*  --  1.46* 1.20* 1.05* 1.21*  CALCIUM 9.1  --  9.0 8.4* 8.8* 8.6*  PROT 7.0  --  6.4* 5.3*  --   --   BILITOT 0.3  --  0.9 0.8  --   --   ALKPHOS 86  --  64 54  --   --   ALT 15   --  13* 10*  --   --   AST 22  --  13* 11*  --   --   GLUCOSE 441*  < > 408* 243* 217* 288*  < > = values in this interval not displayed.    Imaging/Diagnostic Tests: Dg Elbow Complete Right (3+view)  Result Date: 12/04/2016 CLINICAL DATA:  Acute onset of right posterior elbow pain. Initial encounter. EXAM: RIGHT ELBOW - COMPLETE 3+ VIEW COMPARISON:  None. FINDINGS: There is no evidence of fracture or dislocation. No osseous erosions are seen to suggest osteomyelitis. The visualized joint spaces are preserved. An apparent elbow joint effusion is noted. Dorsal soft tissue swelling is noted at the elbow. IMPRESSION: 1. No definite evidence of fracture or dislocation. No osseous erosions seen to suggest osteomyelitis. 2. Apparent elbow joint effusion noted. 3. Dorsal soft tissue swelling at the elbow. Electronically Signed   By: Garald Balding M.D.   On: 12/04/2016 03:01   Dg Forearm Right  Result Date: 12/03/2016 CLINICAL DATA:  46 year old [name] with a history of wound infection EXAM: RIGHT FOREARM - 2 VIEW COMPARISON:  None. FINDINGS: No acute bony abnormality. No erosive changes. No periosteal reaction. No radiopaque foreign body. Nonspecific soft tissue swelling of the proximal forearm. Vascular calcifications. IMPRESSION: Negative for acute bony abnormality. Nonspecific soft tissue swelling of the proximal forearm. Vascular calcifications. Electronically Signed   By: Corrie Mckusick D.O.   On: 12/03/2016 17:05   Dg Wrist Complete Right  Result Date: 12/04/2016 CLINICAL DATA:  Acute onset of right wrist swelling and pain. Initial encounter. EXAM: RIGHT WRIST - COMPLETE 3+ VIEW COMPARISON:  None. FINDINGS: There is no evidence of fracture or dislocation. No osseous erosions are seen to suggest osteomyelitis. The carpal rows are intact, and demonstrate normal alignment. The joint spaces are preserved. Mild diffuse soft tissue swelling is noted about the wrist. Scattered vascular calcifications are  seen. IMPRESSION: 1. No evidence of fracture or dislocation. No osseous erosions are seen to suggest osteomyelitis. 2. Mild diffuse soft tissue swelling about the wrist. 3. Scattered vascular calcifications seen. Electronically Signed   By: Garald Balding M.D.   On: 12/04/2016 03:02   Dg Tibia/fibula Left  Result Date: 12/04/2016 CLINICAL DATA:  Open wounds at the left lower leg. Initial encounter. EXAM: LEFT TIBIA AND FIBULA - 2 VIEW COMPARISON:  Left knee radiograph performed 01/17/2015 FINDINGS: There is no evidence of fracture or dislocation. The tibia and fibula appear intact. The ankle mortise is grossly unremarkable in appearance. The knee joint is unremarkable. No knee joint effusion is identified. Known soft tissue wounds are not well characterized on radiograph. IMPRESSION: No evidence of fracture or dislocation. Electronically Signed   By: Garald Balding M.D.   On: 12/04/2016 03:05   Dg Hand 2 View Right  Result Date: 12/04/2016 CLINICAL DATA:  Acute onset of right hand pain and swelling. Initial encounter. EXAM: RIGHT HAND - 2 VIEW COMPARISON:  Right forearm radiographs performed earlier today at 4:48 p.m. FINDINGS: There is no evidence of fracture or dislocation. No osseous erosions are seen. The joint spaces are preserved. The carpal rows are intact, and demonstrate normal alignment. The soft tissues are unremarkable in appearance. IMPRESSION: No evidence of fracture or dislocation.  No osseous erosions seen. Electronically Signed   By: Garald Balding M.D.   On: 12/04/2016 03:04    Bonnita Hollow, MD 12/06/2016, 7:13 AM PGY-1, [place] Intern pager: [phone], text pages welcome

## 2016-12-06 NOTE — Telephone Encounter (Signed)
Will forward to MD. Jazmin Hartsell,CMA  

## 2016-12-06 NOTE — Progress Notes (Signed)
Patient c/o urge to smoke. Open to using a nicotine patch. Provider on call paged # [phone] notified. Provider to enter orders in to [facility]. Jacqualyn Posey, RN

## 2016-12-07 LAB — CBC
HEMATOCRIT: 26 % — AB (ref 36.0–46.0)
Hemoglobin: 8.5 g/dL — ABNORMAL LOW (ref 12.0–15.0)
MCH: 29.8 pg (ref 26.0–34.0)
MCHC: 32.7 g/dL (ref 30.0–36.0)
MCV: 91.2 fL (ref 78.0–100.0)
PLATELETS: 205 10*3/uL (ref 150–400)
RBC: 2.85 MIL/uL — ABNORMAL LOW (ref 3.87–5.11)
RDW: 14.5 % (ref 11.5–15.5)
WBC: 10.5 10*3/uL (ref 4.0–10.5)

## 2016-12-07 LAB — BASIC METABOLIC PANEL
ANION GAP: 7 (ref 5–15)
BUN: 10 mg/dL (ref 6–20)
CALCIUM: 8.7 mg/dL — AB (ref 8.9–10.3)
CO2: 23 mmol/L (ref 22–32)
CREATININE: 1.1 mg/dL — AB (ref 0.44–1.00)
Chloride: 104 mmol/L (ref 101–111)
GFR, EST NON AFRICAN AMERICAN: 59 mL/min — AB (ref >60)
GLUCOSE: 354 mg/dL — AB (ref 65–99)
Potassium: 3.8 mmol/L (ref 3.5–5.1)
Sodium: 134 mmol/L — ABNORMAL LOW (ref 135–145)

## 2016-12-07 LAB — GLUCOSE, CAPILLARY
Glucose-Capillary: 303 mg/dL — ABNORMAL HIGH (ref 65–99)
Glucose-Capillary: 144 mg/dL — ABNORMAL HIGH (ref 65–99)
Glucose-Capillary: 216 mg/dL — ABNORMAL HIGH (ref 65–99)
Glucose-Capillary: 318 mg/dL — ABNORMAL HIGH (ref 65–99)

## 2016-12-07 LAB — VANCOMYCIN, TROUGH: VANCOMYCIN TR: 14 ug/mL — AB (ref 15–20)

## 2016-12-07 MED ORDER — DOXYCYCLINE HYCLATE 100 MG PO TABS
100.0000 mg | ORAL_TABLET | Freq: Two times a day (BID) | ORAL | Status: DC
  Administered 2016-12-07 – 2016-12-08 (×3): 100 mg via ORAL
  Filled 2016-12-07 (×3): qty 1

## 2016-12-07 MED ORDER — PREDNISONE 5 MG PO TABS
15.0000 mg | ORAL_TABLET | Freq: Every day | ORAL | Status: DC
  Administered 2016-12-08 – 2016-12-11 (×4): 15 mg via ORAL
  Filled 2016-12-07 (×4): qty 1

## 2016-12-07 MED ORDER — INSULIN GLARGINE 100 UNIT/ML ~~LOC~~ SOLN
13.0000 [IU] | Freq: Every day | SUBCUTANEOUS | Status: DC
  Administered 2016-12-07: 13 [IU] via SUBCUTANEOUS
  Filled 2016-12-07: qty 0.13

## 2016-12-07 NOTE — Progress Notes (Signed)
Family Medicine Teaching Service Daily Progress Note Intern Pager: [phone]  Patient name: Christine [name] Medical record number: [id number] Date of birth: May 16, 1970 Age: 46 y.o. Gender: female  Primary Care Provider: Steve Rattler, DO Consultants: None Code Status: Full  Pt Overview and Major Events to Date:  Christine [name] is a 46 y.o. female presenting with 3 day history of right arm swelling, pain, purulent wound drainage. PMH is significant for chronic alcoholic pancreatitis, DMII, HLD, tobacco abuse, depression, HTN, Rash  Assessment and Plan:  RIGHT Arm Cellulitis: improving. Likely 2/2 punch biopsy with poor wound healing from tobacco use, EtOH use, and prednisone. Afebrile.  Leukocytosis resolved 10.5 from 20.  XR showed some effusion around elbow, CT showed cellulitis and small joint effusion. Ortho following, no surgical indication at this time.  - s/p vancomycin (8/7-8/11), s/p Zosyn (8/7-8/11) - transition to doxycycline 100 mg BID (8/11 - ) - Follow BCx (NGTD) -Tylenol PRN for mild pain and Oxycodone IR 5 mg PRN for moderate to severe pain - appreciate ortho recs  AKI, improved - Cr 1.10 from 1.46 on admission. Likely prerenal due to dec PO.  - monitor AM BMP  Chronic diffuse rash: uncertain etiology, has been several months duration. No improvement on oral prednisone x3 weeks. Of note patient does have various marks and scrapes on her skin of unknown etiology. Patient possibly falling at home due to alcohol abuse. Negative RPR, HIV - taper prednisone - 20 mg Qd >>15 mg Qd starting on 8/12  Hypertension: currently normotensive. Home meds include lisinopril-HCTZ 20-12.5 mg.  Held in setting of sepsis on admission. - holding home meds, restart as needed  T2DM with hyperglycemia: last A1C 6.0. Hyperglycemia likely in setting of secondary to prednisone use and infection. Patient on 10u lantus, received 17u short-acting overnight. CBGs elevated in 200s. - titrate lantus  10u >> 13u - resistant sliding scale - Carb modified diet - likely will need to titrate down insulin as infection resolves/prednisone is tapered  H/o Alcohol Abuse. Pt drinks 5 beers a day. - CIWA protocol, scores 0 overnight - thiamine and folate  Tobacco Abuse - nicotine patch prn  Thrush, improved. In setting of alcoholism, tobacco use, and prednisone use.  - Nystatin mouthwash - neg HIV  FEN/GI: Carb modified diet, Prophylaxis: LMWH  Disposition: Monitor on PO Abx, plan for home if continues to improve  Subjective:   Patient feels cellulitis is improving. No acute events overnight. No new complaints this AM.  Objective: Temp:  [97.6 F (36.4 C)-98.6 F (37 C)] 97.9 F (36.6 C) (08/11 0450) Pulse Rate:  [56-65] 65 (08/11 0450) Resp:  [16-18] 18 (08/11 0450) BP: (113-142)/(68-76) 142/74 (08/11 0450) SpO2:  [96 %-100 %] 96 % (08/11 0450)  Physical Exam: GEN: NAD, rests comfortably CARD: RRR, no m/r/g PULM: CTA bil, no W/R/R SKIN: +warmth and edema over RUE extending to elbow. Erythema does not extend to previously drawn margins. Strong distal pulses, no pain with movement of the fingers, no dec sensation EXT: No LE edema PSYCH: AAOx3, appropriate affect   Laboratory:  Recent Labs Lab 12/05/16 0422 12/06/16 0337 12/07/16 0218  WBC 20.8* 18.8* 10.5  HGB 8.8* 8.1* 8.5*  HCT 26.3* 24.0* 26.0*  PLT 157 160 205    Recent Labs Lab 12/03/16 1236 12/04/16 0329 12/05/16 0422 12/06/16 0337 12/07/16 0218  NA 126* 128* 132* 128* 134*  K 4.3 3.9 3.8 3.7 3.8  CL 94* 99* 101 100* 104  CO2 19* 20* 21*  21* 23  BUN 21* 20 14 13 10   CREATININE 1.46* 1.20* 1.05* 1.21* 1.10*  CALCIUM 9.0 8.4* 8.8* 8.6* 8.7*  PROT 6.4* 5.3*  --   --   --   BILITOT 0.9 0.8  --   --   --   ALKPHOS 64 54  --   --   --   ALT 13* 10*  --   --   --   AST 13* 11*  --   --   --   GLUCOSE 408* 243* 217* 288* 354*      Imaging/Diagnostic Tests: Dg Elbow Complete Right (3+view):  12/04/2016 FINDINGS: There is no evidence of fracture or dislocation. No osseous erosions are seen to suggest osteomyelitis. The visualized joint spaces are preserved. An apparent elbow joint effusion is noted. Dorsal soft tissue swelling is noted at the elbow.  IMPRESSION:  1. No definite evidence of fracture or dislocation. No osseous erosions seen to suggest osteomyelitis.  2. Apparent elbow joint effusion noted.  3. Dorsal soft tissue swelling at the elbow.   Dg Forearm Right 12/03/2016  FINDINGS: No acute bony abnormality. No erosive changes. No periosteal reaction. No radiopaque foreign body. Nonspecific soft tissue swelling of the proximal forearm. Vascular calcifications.  IMPRESSION: Negative for acute bony abnormality. Nonspecific soft tissue swelling of the proximal forearm. Vascular calcifications.  Dg Wrist Complete Right 12/04/2016 FINDINGS: There is no evidence of fracture or dislocation. No osseous erosions are seen to suggest osteomyelitis. The carpal rows are intact, and demonstrate normal alignment. The joint spaces are preserved. Mild diffuse soft tissue swelling is noted about the wrist. Scattered vascular calcifications are seen.  IMPRESSION:  1. No evidence of fracture or dislocation. No osseous erosions are seen to suggest osteomyelitis.  2. Mild diffuse soft tissue swelling about the wrist.  3. Scattered vascular calcifications seen.   Dg Tibia/fibula Left 12/04/2016 FINDINGS: There is no evidence of fracture or dislocation. The tibia and fibula appear intact. The ankle mortise is grossly unremarkable in appearance. The knee joint is unremarkable. No knee joint effusion is identified. Known soft tissue wounds are not well characterized on radiograph. IMPRESSION: No evidence of fracture or dislocation.   Dg Hand 2 View Right 12/04/2016 FINDINGS: There is no evidence of fracture or dislocation. No osseous erosions are seen. The joint spaces are preserved. The carpal rows are  intact, and demonstrate normal alignment. The soft tissues are unremarkable in appearance.  IMPRESSION: No evidence of fracture or dislocation.  No osseous erosions seen.   Everrett Coombe, MD 12/07/2016, 8:25 AM PGY-2, [place] Intern pager: [phone], text pages welcome

## 2016-12-07 NOTE — Progress Notes (Signed)
Pharmacy Antibiotic Note  Christine [name] is a 46 y.o. female admitted on 12/03/2016 with cellulitis.  Pharmacy has been consulted for Vancocin and Zosyn dosing.  Vanc trough at goal.  Plan: This patient's current antibiotics will be continued without adjustments.  Height: 5\' 9"  (175.3 cm) Weight: 160 lb 9.6 oz (72.8 kg) IBW/kg (Calculated) : 66.2  Temp (24hrs), Avg:98.1 F (36.7 C), Min:97.6 F (36.4 C), Max:98.6 F (37 C)   Recent Labs Lab 12/03/16 1236 12/03/16 1311 12/03/16 1514 12/03/16 1819 12/03/16 2117 12/04/16 0329 12/05/16 0422 12/06/16 0337 12/07/16 0218  WBC 20.0*  --   --   --   --  19.6* 20.8* 18.8* 10.5  CREATININE 1.46*  --   --   --   --  1.20* 1.05* 1.21* 1.10*  LATICACIDVEN  --  1.84 2.42* 2.2* 1.3  --   --   --   --   VANCOTROUGH  --   --   --   --   --   --   --   --  14*    Estimated Creatinine Clearance: 66.8 mL/min (A) (by C-G formula based on SCr of 1.1 mg/dL (H)).    No Known Allergies  Antimicrobials this admission: Vanc 8/7 >>  Zosyn 8/7 >>   Microbiology results: 8/7 BCx: NGTD   Thank you for allowing pharmacy to be a part of this patient's care.  Wynona Neat, PharmD, BCPS  12/07/2016 3:14 AM

## 2016-12-08 LAB — GLUCOSE, CAPILLARY
Glucose-Capillary: 320 mg/dL — ABNORMAL HIGH (ref 65–99)
Glucose-Capillary: 231 mg/dL — ABNORMAL HIGH (ref 65–99)
Glucose-Capillary: 168 mg/dL — ABNORMAL HIGH (ref 65–99)
Glucose-Capillary: 252 mg/dL — ABNORMAL HIGH (ref 65–99)

## 2016-12-08 LAB — CBC
HEMATOCRIT: 25.7 % — AB (ref 36.0–46.0)
Hemoglobin: 8.4 g/dL — ABNORMAL LOW (ref 12.0–15.0)
MCH: 29.8 pg (ref 26.0–34.0)
MCHC: 32.7 g/dL (ref 30.0–36.0)
MCV: 91.1 fL (ref 78.0–100.0)
PLATELETS: 283 10*3/uL (ref 150–400)
RBC: 2.82 MIL/uL — ABNORMAL LOW (ref 3.87–5.11)
RDW: 14.8 % (ref 11.5–15.5)
WBC: 8.1 10*3/uL (ref 4.0–10.5)

## 2016-12-08 LAB — CULTURE, BLOOD (ROUTINE X 2)
Culture: NO GROWTH
Culture: NO GROWTH
SPECIAL REQUESTS: ADEQUATE
SPECIAL REQUESTS: ADEQUATE

## 2016-12-08 LAB — BASIC METABOLIC PANEL
Anion gap: 6 (ref 5–15)
BUN: 9 mg/dL (ref 6–20)
CHLORIDE: 108 mmol/L (ref 101–111)
CO2: 22 mmol/L (ref 22–32)
CREATININE: 1 mg/dL (ref 0.44–1.00)
Calcium: 8.3 mg/dL — ABNORMAL LOW (ref 8.9–10.3)
GFR calc Af Amer: >60 mL/min (ref >60)
GFR calc non Af Amer: >60 mL/min (ref >60)
GLUCOSE: 305 mg/dL — AB (ref 65–99)
POTASSIUM: 3.4 mmol/L — AB (ref 3.5–5.1)
Sodium: 136 mmol/L (ref 135–145)

## 2016-12-08 MED ORDER — PIPERACILLIN-TAZOBACTAM 3.375 G IVPB
3.3750 g | Freq: Three times a day (TID) | INTRAVENOUS | Status: DC
  Administered 2016-12-08 – 2016-12-10 (×6): 3.375 g via INTRAVENOUS
  Filled 2016-12-08 (×7): qty 50

## 2016-12-08 MED ORDER — INSULIN GLARGINE 100 UNIT/ML ~~LOC~~ SOLN
15.0000 [IU] | Freq: Every day | SUBCUTANEOUS | Status: DC
  Administered 2016-12-08 – 2016-12-10 (×3): 15 [IU] via SUBCUTANEOUS
  Filled 2016-12-08 (×5): qty 0.15

## 2016-12-08 MED ORDER — DIPHENHYDRAMINE HCL 25 MG PO CAPS
50.0000 mg | ORAL_CAPSULE | Freq: Once | ORAL | Status: AC | PRN
  Administered 2016-12-08: 50 mg via ORAL
  Filled 2016-12-08: qty 2

## 2016-12-08 MED ORDER — HYDROCHLOROTHIAZIDE 12.5 MG PO CAPS
12.5000 mg | ORAL_CAPSULE | Freq: Every day | ORAL | Status: DC
  Administered 2016-12-08 – 2016-12-11 (×5): 12.5 mg via ORAL
  Filled 2016-12-08 (×4): qty 1

## 2016-12-08 MED ORDER — VANCOMYCIN HCL IN DEXTROSE 750-5 MG/150ML-% IV SOLN
750.0000 mg | Freq: Two times a day (BID) | INTRAVENOUS | Status: DC
  Administered 2016-12-08 – 2016-12-10 (×4): 750 mg via INTRAVENOUS
  Filled 2016-12-08 (×5): qty 150

## 2016-12-08 NOTE — Progress Notes (Signed)
Pharmacy Antibiotic Note  Christine [name] is a 46 y.o. female admitted on 12/03/2016 with cellulitis.  Pharmacy has been consulted for Vancocin and Zosyn dosing.    s/p vancomycin (8/7-8/11), s/p Zosyn (8/7-8/11) >>>> doxycycline (8/11) >>>transition back to vanc/zosyn 8/12  Vanc trough previously at goal on 750mg  IV q12h.  Plan: Vancomycin 750 mg IV q12hr Zosyn 3.375 g IV q8h Monitor renal function, cultures, and steady-state troughs as needed.   Height: 5\' 9"  (175.3 cm) Weight: 160 lb 9.6 oz (72.8 kg) IBW/kg (Calculated) : 66.2  Temp (24hrs), Avg:98 F (36.7 C), Min:97.5 F (36.4 C), Max:98.4 F (36.9 C)   Recent Labs Lab 12/03/16 1311 12/03/16 1514 12/03/16 1819 12/03/16 2117 12/04/16 0329 12/05/16 0422 12/06/16 0337 12/07/16 0218 12/08/16 0527  WBC  --   --   --   --  19.6* 20.8* 18.8* 10.5 8.1  CREATININE  --   --   --   --  1.20* 1.05* 1.21* 1.10* 1.00  LATICACIDVEN 1.84 2.42* 2.2* 1.3  --   --   --   --   --   VANCOTROUGH  --   --   --   --   --   --   --  14*  --     Estimated Creatinine Clearance: 73.5 mL/min (by C-G formula based on SCr of 1 mg/dL).    No Known Allergies  Antimicrobials this admission: s/p vancomycin (8/7-8/11), s/p Zosyn (8/7-8/11) >>>> doxycycline (8/11) >>>transition back to vanc/zosyn 8/12  Microbiology results: 8/7 BCx: NGTD   Thank you for allowing pharmacy to be a part of this patient's care.  Angus Seller, PharmD Pharmacy Resident Pager: [phone] 12/08/2016 11:17 AM

## 2016-12-08 NOTE — Progress Notes (Signed)
Family Medicine Teaching Service Daily Progress Note Intern Pager: [phone]  Patient name: Christine [name] Medical record number: [id number] Date of birth: 1970/09/15 Age: 46 y.o. Gender: female  Primary Care Provider: Steve Rattler, DO Consultants: None Code Status: Full  Pt Overview and Major Events to Date:  Christine [name] is a 46 y.o. female presenting with 3 day history of right arm swelling, pain, purulent wound drainage. PMH is significant for chronic alcoholic pancreatitis, DMII ( last A1C 6.0.), HLD, tobacco abuse, depression, HTN, Rash  Assessment and Plan:  RIGHT Arm Cellulitis: worsening on PO doxycycline overnight. Likely 2/2 punch biopsy with poor wound healing from tobacco use, EtOH use, and prednisone. Afebrile.  Leukocytosis resolved, from 20 on admit.  XR showed some effusion around elbow, CT showed cellulitis and small joint effusion. Ortho following, no surgical indication at this time.  - s/p vancomycin (8/7-8/11), s/p Zosyn (8/7-8/11) >>>> doxycycline (8/11) >>>transition back to vanc/zosyn 8/12 - Follow BCx - no growth x4d - Tylenol PRN for mild pain and Oxycodone IR 5 mg PRN for moderate to severe pain - appreciate ortho recs  AKI, improved - Cr 1.0 from 1.46 on admission. Likely prerenal due to dec PO.  - monitor AM BMP  Anemia- Hgb stable at 8.4 from 8.5.  No indication of bleeding.  - monitor AM Hgb, transfusion threshold 7  Chronic diffuse rash: uncertain etiology, has been several months duration. No improvement on oral prednisone x3 weeks. Negative RPR, HIV - taper prednisone - 20 mg Qd >>15 mg Qd starting on 8/12  Hypertension: 155/71 this AM.. Home meds include lisinopril-HCTZ 20-12.5 mg.  Held in setting of sepsis on admission. - will restart home HCTZ 12.5 mg today - can add ACE as kidney function improves/as BPs require  T2DM with hyperglycemia: Hyperglycemia with prednisone use and infection. Patient on 13u lantus, received 25u short-acting  overnight. CBGs elevated in 216, 318, 305 o/n. - titrate lantus 13u >> 15u - resistant sliding scale - Carb modified diet - likely will need to titrate down insulin as infection resolves/prednisone is tapered  H/o Alcohol Abuse. Pt drinks 5 beers a day. - CIWA protocol, scores 0 overnight - thiamine and folate  Tobacco Abuse - nicotine patch prn  Thrush, improved. In setting of alcoholism, tobacco use, and prednisone use.  - Nystatin mouthwash - neg HIV  FEN/GI: Carb modified diet, Prophylaxis: LMWH  Disposition: Monitor on PO Abx, plan for home if continues to improve  Subjective:   Worsening cellulitis per patient this AM. She is drowsy this AM after not sleeping well overnight. She endorses increased warmth and pain and swelling this AM  Objective: Temp:  [97.5 F (36.4 C)-98.4 F (36.9 C)] 97.5 F (36.4 C) (08/12 0505) Pulse Rate:  [59-65] 59 (08/12 0505) Resp:  [18] 18 (08/12 0505) BP: (120-155)/(64-71) 155/71 (08/12 0505) SpO2:  [99 %-100 %] 99 % (08/12 0505)  Physical Exam: GEN: NAD, rests comfortably CARD: RRR, no m/r/g PULM: CTA bil, no W/R/R SKIN: increased +warmth and edema over RUE extending to elbow. Erythema does extend to previously drawn margins. Strong distal pulses, no pain with movement of the fingers, no dec sensation EXT: No LE edema PSYCH: AAOx3, appropriate affect   Laboratory:  Recent Labs Lab 12/06/16 0337 12/07/16 0218 12/08/16 0527  WBC 18.8* 10.5 8.1  HGB 8.1* 8.5* 8.4*  HCT 24.0* 26.0* 25.7*  PLT 160 205 283    Recent Labs Lab 12/03/16 1236 12/04/16 0329  12/06/16 3875 12/07/16 6433  12/08/16 0527  NA 126* 128*  < > 128* 134* 136  K 4.3 3.9  < > 3.7 3.8 3.4*  CL 94* 99*  < > 100* 104 108  CO2 19* 20*  < > 21* 23 22  BUN 21* 20  < > 13 10 9   CREATININE 1.46* 1.20*  < > 1.21* 1.10* 1.00  CALCIUM 9.0 8.4*  < > 8.6* 8.7* 8.3*  PROT 6.4* 5.3*  --   --   --   --   BILITOT 0.9 0.8  --   --   --   --   ALKPHOS 64 54  --    --   --   --   ALT 13* 10*  --   --   --   --   AST 13* 11*  --   --   --   --   GLUCOSE 408* 243*  < > 288* 354* 305*  < > = values in this interval not displayed.   Imaging/Diagnostic Tests: Dg Elbow Complete Right (3+view): 12/04/2016 FINDINGS: There is no evidence of fracture or dislocation. No osseous erosions are seen to suggest osteomyelitis. The visualized joint spaces are preserved. An apparent elbow joint effusion is noted. Dorsal soft tissue swelling is noted at the elbow.  IMPRESSION:  1. No definite evidence of fracture or dislocation. No osseous erosions seen to suggest osteomyelitis.  2. Apparent elbow joint effusion noted.  3. Dorsal soft tissue swelling at the elbow.   Dg Forearm Right 12/03/2016  FINDINGS: No acute bony abnormality. No erosive changes. No periosteal reaction. No radiopaque foreign body. Nonspecific soft tissue swelling of the proximal forearm. Vascular calcifications.  IMPRESSION: Negative for acute bony abnormality. Nonspecific soft tissue swelling of the proximal forearm. Vascular calcifications.  Dg Wrist Complete Right 12/04/2016 FINDINGS: There is no evidence of fracture or dislocation. No osseous erosions are seen to suggest osteomyelitis. The carpal rows are intact, and demonstrate normal alignment. The joint spaces are preserved. Mild diffuse soft tissue swelling is noted about the wrist. Scattered vascular calcifications are seen.  IMPRESSION:  1. No evidence of fracture or dislocation. No osseous erosions are seen to suggest osteomyelitis.  2. Mild diffuse soft tissue swelling about the wrist.  3. Scattered vascular calcifications seen.   Dg Tibia/fibula Left 12/04/2016 FINDINGS: There is no evidence of fracture or dislocation. The tibia and fibula appear intact. The ankle mortise is grossly unremarkable in appearance. The knee joint is unremarkable. No knee joint effusion is identified. Known soft tissue wounds are not well characterized on  radiograph. IMPRESSION: No evidence of fracture or dislocation.   Dg Hand 2 View Right 12/04/2016 FINDINGS: There is no evidence of fracture or dislocation. No osseous erosions are seen. The joint spaces are preserved. The carpal rows are intact, and demonstrate normal alignment. The soft tissues are unremarkable in appearance.  IMPRESSION: No evidence of fracture or dislocation.  No osseous erosions seen.   Everrett Coombe, MD 12/08/2016, 8:10 AM PGY-2, [place] Intern pager: [phone], text pages welcome

## 2016-12-08 NOTE — Progress Notes (Signed)
Patient c/o increased pain to right arm/elbow region. Area is more swollen more painful, increased redness pass the markings of previous red area, & increased warmth, patient states it is also itching more and it is tender to touch. MD notified

## 2016-12-09 LAB — CBC
HCT: 24.9 % — ABNORMAL LOW (ref 36.0–46.0)
Hemoglobin: 8.3 g/dL — ABNORMAL LOW (ref 12.0–15.0)
MCH: 30.3 pg (ref 26.0–34.0)
MCHC: 33.3 g/dL (ref 30.0–36.0)
MCV: 90.9 fL (ref 78.0–100.0)
PLATELETS: 310 10*3/uL (ref 150–400)
RBC: 2.74 MIL/uL — AB (ref 3.87–5.11)
RDW: 14.8 % (ref 11.5–15.5)
WBC: 10.2 10*3/uL (ref 4.0–10.5)

## 2016-12-09 LAB — BASIC METABOLIC PANEL
Anion gap: 8 (ref 5–15)
BUN: 8 mg/dL (ref 6–20)
CALCIUM: 8.2 mg/dL — AB (ref 8.9–10.3)
CHLORIDE: 105 mmol/L (ref 101–111)
CO2: 23 mmol/L (ref 22–32)
CREATININE: 0.98 mg/dL (ref 0.44–1.00)
GFR calc non Af Amer: >60 mL/min (ref >60)
GLUCOSE: 225 mg/dL — AB (ref 65–99)
Potassium: 3.1 mmol/L — ABNORMAL LOW (ref 3.5–5.1)
Sodium: 136 mmol/L (ref 135–145)

## 2016-12-09 LAB — GLUCOSE, CAPILLARY
GLUCOSE-CAPILLARY: 157 mg/dL — AB (ref 65–99)
GLUCOSE-CAPILLARY: 197 mg/dL — AB (ref 65–99)
Glucose-Capillary: 195 mg/dL — ABNORMAL HIGH (ref 65–99)
Glucose-Capillary: 293 mg/dL — ABNORMAL HIGH (ref 65–99)

## 2016-12-09 MED ORDER — POTASSIUM CHLORIDE CRYS ER 20 MEQ PO TBCR
40.0000 meq | EXTENDED_RELEASE_TABLET | Freq: Once | ORAL | Status: AC
  Administered 2016-12-09: 40 meq via ORAL
  Filled 2016-12-09: qty 2

## 2016-12-09 MED ORDER — FLUCONAZOLE 100 MG PO TABS
150.0000 mg | ORAL_TABLET | Freq: Once | ORAL | Status: AC
  Administered 2016-12-09: 150 mg via ORAL
  Filled 2016-12-09: qty 2

## 2016-12-09 NOTE — Progress Notes (Signed)
Family Medicine Teaching Service Daily Progress Note Intern Pager: [phone]  Patient name: Christine [name] Medical record number: [id number] Date of birth: August 06, 1970 Age: 46 y.o. Gender: female  Primary Care Provider: Steve Rattler, DO Consultants: None Code Status: Full  Pt Overview and Major Events to Date:  Christine [name] is a 46 y.o. female presenting with 3 day history of right arm swelling, pain, purulent wound drainage. PMH is significant for chronic alcoholic pancreatitis, DMII ( last A1C 6.0.), HLD, tobacco abuse, depression, HTN, Rash  Assessment and Plan:  R Arm Cellulitis:  Likely 2/2 punch biopsy with poor wound healing from tobacco use, EtOH use, and prednisone. Continues to be afebrile.  Leukocytosis resolved, from 20 on admit.  XR showed some effusion around elbow, CT showed cellulitis and small joint effusion. Ortho following, no surgical indication at this time.  - s/p vancomycin (8/7-8/11), s/p Zosyn (8/7-8/11) > doxycycline (8/11) with resulting clinical worsening >transition back to vanc/zosyn 8/12, can be transitioned back to PO abx on 8/14 - Follow BCx - no growth x5d - Tylenol PRN for mild pain and Oxycodone IR 5 mg PRN for moderate to severe pain - appreciate ortho recs  AKI, improved - Cr 0.98 on 8/13, down from 1.46 on admission. Likely prerenal due to dec PO.  - monitor AM BMP - continue NS 100 ml/hr infusion  Anemia- Hgb stable at 8.4 from 8.5.  No indication of bleeding.  - monitor daily Hgb, transfusion threshold 7  Chronic diffuse rash: uncertain etiology, has been several months duration. No improvement on oral prednisone x3 weeks. Negative RPR, HIV - taper prednisone - 20 mg Qd >>15 mg Qd starting on 8/12  Hypertension: 158/74 on 8/13. Home meds include lisinopril-HCTZ 20-12.5 mg.  Held in setting of sepsis on admission. - continue home HCTZ 12.5 mg today - can add ACE as kidney function improves/as BPs require  T2DM with hyperglycemia:  Hyperglycemia with prednisone use and infection. Patient on 15u lantus, received 29u short-acting overnight. CBGs in mid-200s. - continue 15u Lantus - resistant sliding scale - Carb modified diet - likely will need to titrate down insulin as infection resolves/prednisone is tapered  H/o Alcohol Abuse. Pt drinks 5 beers a day. - CIWA protocol, continues to have scores of 0 overnight - thiamine and folate  Tobacco Abuse - nicotine patch prn  Thrush, improved. In setting of alcoholism, tobacco use, and prednisone use.  - Nystatin mouthwash - neg HIV  LLE wounds  - wound care saw on 8/11, will see again on 8/14  Yeast Infection: reported white, malodorous discharge, likely yeast infection given prednisone and abx regimen - diflucan 150 mg once on 8/13, can reevaluate and give more in 72 hours  FEN/GI: Carb modified diet, Prophylaxis: LMWH  Disposition: Monitor on IV Abx, plan for transition to PO abx tomorrow if continues to improve  Subjective:  Christine [name] says that her elbow pain is throbbing but is somewhat improved from previous days.  She says that her LLE wounds feel "tight and weird."   Objective: Temp:  [97.6 F (36.4 C)-98.3 F (36.8 C)] 98.3 F (36.8 C) (08/12 2118) Pulse Rate:  [58-67] 58 (08/12 2118) Resp:  [16-18] 18 (08/12 2118) BP: (158-168)/(61-98) 158/74 (08/12 2337) SpO2:  [100 %] 100 % (08/12 2118)  Physical Exam: GEN: NAD, resting comfortably CARD: RRR, no m/r/g PULM: CTAB, no W/R/R SKIN: increased warmth and edema over RUE extending to elbow. Slight erythema, which extends to previously drawn margins. Strong distal  pulses, no pain with movement of the fingers, no decreased sensation.  EXT: No LE edema PSYCH: AAOx3, appropriate affect   Laboratory:  Recent Labs Lab 12/07/16 0218 12/08/16 0527 12/09/16 0241  WBC 10.5 8.1 10.2  HGB 8.5* 8.4* 8.3*  HCT 26.0* 25.7* 24.9*  PLT 205 283 310    Recent Labs Lab 12/03/16 1236 12/04/16 0329   12/07/16 0218 12/08/16 0527 12/09/16 0241  NA 126* 128*  < > 134* 136 136  K 4.3 3.9  < > 3.8 3.4* 3.1*  CL 94* 99*  < > 104 108 105  CO2 19* 20*  < > 23 22 23   BUN 21* 20  < > 10 9 8   CREATININE 1.46* 1.20*  < > 1.10* 1.00 0.98  CALCIUM 9.0 8.4*  < > 8.7* 8.3* 8.2*  PROT 6.4* 5.3*  --   --   --   --   BILITOT 0.9 0.8  --   --   --   --   ALKPHOS 64 54  --   --   --   --   ALT 13* 10*  --   --   --   --   AST 13* 11*  --   --   --   --   GLUCOSE 408* 243*  < > 354* 305* 225*  < > = values in this interval not displayed.   Imaging/Diagnostic Tests: Dg Elbow Complete Right (3+view): 12/04/2016 FINDINGS: There is no evidence of fracture or dislocation. No osseous erosions are seen to suggest osteomyelitis. The visualized joint spaces are preserved. An apparent elbow joint effusion is noted. Dorsal soft tissue swelling is noted at the elbow.  IMPRESSION:  1. No definite evidence of fracture or dislocation. No osseous erosions seen to suggest osteomyelitis.  2. Apparent elbow joint effusion noted.  3. Dorsal soft tissue swelling at the elbow.   Dg Forearm Right 12/03/2016  FINDINGS: No acute bony abnormality. No erosive changes. No periosteal reaction. No radiopaque foreign body. Nonspecific soft tissue swelling of the proximal forearm. Vascular calcifications.  IMPRESSION: Negative for acute bony abnormality. Nonspecific soft tissue swelling of the proximal forearm. Vascular calcifications.  Dg Wrist Complete Right 12/04/2016 FINDINGS: There is no evidence of fracture or dislocation. No osseous erosions are seen to suggest osteomyelitis. The carpal rows are intact, and demonstrate normal alignment. The joint spaces are preserved. Mild diffuse soft tissue swelling is noted about the wrist. Scattered vascular calcifications are seen.  IMPRESSION:  1. No evidence of fracture or dislocation. No osseous erosions are seen to suggest osteomyelitis.  2. Mild diffuse soft tissue swelling about the  wrist.  3. Scattered vascular calcifications seen.   Dg Tibia/fibula Left 12/04/2016 FINDINGS: There is no evidence of fracture or dislocation. The tibia and fibula appear intact. The ankle mortise is grossly unremarkable in appearance. The knee joint is unremarkable. No knee joint effusion is identified. Known soft tissue wounds are not well characterized on radiograph. IMPRESSION: No evidence of fracture or dislocation.   Dg Hand 2 View Right 12/04/2016 FINDINGS: There is no evidence of fracture or dislocation. No osseous erosions are seen. The joint spaces are preserved. The carpal rows are intact, and demonstrate normal alignment. The soft tissues are unremarkable in appearance.  IMPRESSION: No evidence of fracture or dislocation.  No osseous erosions seen.   Kathrene Alu, MD 12/09/2016, 7:24 AM PGY-1, [place] Intern pager: [phone], text pages welcome

## 2016-12-10 LAB — GLUCOSE, CAPILLARY
GLUCOSE-CAPILLARY: 168 mg/dL — AB (ref 65–99)
Glucose-Capillary: 176 mg/dL — ABNORMAL HIGH (ref 65–99)
Glucose-Capillary: 230 mg/dL — ABNORMAL HIGH (ref 65–99)
Glucose-Capillary: 274 mg/dL — ABNORMAL HIGH (ref 65–99)

## 2016-12-10 LAB — BASIC METABOLIC PANEL
Anion gap: 6 (ref 5–15)
BUN: 9 mg/dL (ref 6–20)
CHLORIDE: 105 mmol/L (ref 101–111)
CO2: 24 mmol/L (ref 22–32)
CREATININE: 1.02 mg/dL — AB (ref 0.44–1.00)
Calcium: 8.1 mg/dL — ABNORMAL LOW (ref 8.9–10.3)
GFR calc Af Amer: >60 mL/min (ref >60)
GFR calc non Af Amer: >60 mL/min (ref >60)
GLUCOSE: 251 mg/dL — AB (ref 65–99)
Potassium: 3.3 mmol/L — ABNORMAL LOW (ref 3.5–5.1)
SODIUM: 135 mmol/L (ref 135–145)

## 2016-12-10 LAB — CBC
HCT: 24.9 % — ABNORMAL LOW (ref 36.0–46.0)
HEMOGLOBIN: 8.2 g/dL — AB (ref 12.0–15.0)
MCH: 30 pg (ref 26.0–34.0)
MCHC: 32.9 g/dL (ref 30.0–36.0)
MCV: 91.2 fL (ref 78.0–100.0)
Platelets: 364 10*3/uL (ref 150–400)
RBC: 2.73 MIL/uL — ABNORMAL LOW (ref 3.87–5.11)
RDW: 14.9 % (ref 11.5–15.5)
WBC: 9.9 10*3/uL (ref 4.0–10.5)

## 2016-12-10 MED ORDER — DOXYCYCLINE HYCLATE 100 MG PO TABS
100.0000 mg | ORAL_TABLET | Freq: Two times a day (BID) | ORAL | Status: DC
  Administered 2016-12-10 – 2016-12-11 (×3): 100 mg via ORAL
  Filled 2016-12-10 (×3): qty 1

## 2016-12-10 MED ORDER — POTASSIUM CHLORIDE CRYS ER 20 MEQ PO TBCR
40.0000 meq | EXTENDED_RELEASE_TABLET | Freq: Two times a day (BID) | ORAL | Status: DC
  Administered 2016-12-10: 40 meq via ORAL
  Filled 2016-12-10: qty 2

## 2016-12-10 NOTE — Progress Notes (Signed)
Family Medicine Teaching Service Daily Progress Note Intern Pager: [phone]  Patient name: Christine [name] Medical record number: [id number] Date of birth: 06-10-1970 Age: 46 y.o. Gender: female  Primary Care Provider: Steve Rattler, DO Consultants: None Code Status: Full  Pt Overview and Major Events to Date:  Christine [name] is a 46 y.o. female presenting with 3 day history of right arm swelling, pain, purulent wound drainage. PMH is significant for chronic alcoholic pancreatitis, DMII ( last A1C 6.0.), HLD, tobacco abuse, depression, HTN, Rash  Assessment and Plan:  R Arm Cellulitis:  Likely 2/2 punch biopsy with poor wound healing from tobacco use, EtOH use, and prednisone. Continues to be afebrile.  Leukocytosis resolved, from 20 on admit.  XR showed some effusion around elbow, CT showed cellulitis and small joint effusion. Ortho following, no surgical indication at this time.  - s/p vancomycin (8/7-8/11), s/p Zosyn (8/7-8/11) > doxycycline (8/11) with resulting clinical worsening >transition back to vanc/zosyn 8/12, can be transitioned back to PO abx on 8/14 - Follow BCx - no growth x5d - Tylenol PRN for mild pain and Oxycodone IR 5 mg PRN for moderate to severe pain - retry oral doxy, d/c IV abx  AKI, improved - Cr 0.98 on 8/13, down from 1.46 on admission. Likely prerenal due to dec PO.  - monitor AM BMP - d/c IVF  Anemia- Hgb stable at 8.4 from 8.5.  No indication of bleeding.  - monitor daily Hgb, transfusion threshold 7  Chronic diffuse rash: uncertain etiology, has been several months duration. No improvement on oral prednisone x3 weeks. Negative RPR, HIV - taper prednisone - 20 mg Qd >>15 mg Qd starting on 8/12  Hypertension: 158/74 on 8/13. Home meds include lisinopril-HCTZ 20-12.5 mg.  Held in setting of sepsis on admission. - continue home HCTZ 12.5 mg today - can add ACE as kidney function improves/as BPs require  T2DM with hyperglycemia: Hyperglycemia with  prednisone use and infection. Patient on 15u lantus, received 29u short-acting overnight. CBGs in mid-200s. - resistant sliding scale - Carb modified diet - f/u w/ patient if she would not mind giving herself insulin injections. If so, determine appropriate dosage/cost regarding what the patient can afford (i.e. Lantus vs. NPH at [facility]). Pt is paying for insulin out of pocket.  - if not, consider oral agent such as sulfonylurea for CBG control -Will require close outpatient f/u as predinose is tapered.    H/o Alcohol Abuse. Pt drinks 5 beers a day. CIWA consistantly 0 - stop CIWA - thiamine and folate  Tobacco Abuse - nicotine patch prn  Thrush, improved. In setting of alcoholism, tobacco use, and prednisone use.  - Nystatin mouthwash - neg HIV  LLE wounds  - wound care saw on 8/11, will see again on 8/14  Yeast Infection: reported white, malodorous discharge, likely yeast infection given prednisone and abx regimen - diflucan 150 mg once on 8/13, can reevaluate and give more in 72 hours  FEN/GI: Carb modified diet, Prophylaxis: LMWH  Disposition:  plan for transition to PO abx, if tolerating well, possible d/c  Subjective:   Patient arm is feeling much better. She has nearly regained full ROM w/o pain.   Objective: Temp:  [98 F (36.7 C)-98.4 F (36.9 C)] 98 F (36.7 C) (08/14 0552) Pulse Rate:  [52-71] 52 (08/14 0552) Resp:  [16-18] 18 (08/14 0552) BP: (130-133)/(69-75) 132/75 (08/14 0552) SpO2:  [99 %-100 %] 99 % (08/14 0552)  Physical Exam: GEN: NAD, resting comfortably CARD:  RRR, no m/r/g PULM: CTAB, no W/R/R SKIN: decreased erythema and edema around RUE extending to elbow. Near ROM of elbow joint. No purulent drainage from R forearm wound EXT: No LE edema PSYCH: AAOx3, appropriate affect  Laboratory:  Recent Labs Lab 12/08/16 0527 12/09/16 0241 12/10/16 0308  WBC 8.1 10.2 9.9  HGB 8.4* 8.3* 8.2*  HCT 25.7* 24.9* 24.9*  PLT 283 310 364     Recent Labs Lab 12/03/16 1236 12/04/16 0329  12/08/16 0527 12/09/16 0241 12/10/16 0308  NA 126* 128*  < > 136 136 135  K 4.3 3.9  < > 3.4* 3.1* 3.3*  CL 94* 99*  < > 108 105 105  CO2 19* 20*  < > 22 23 24   BUN 21* 20  < > 9 8 9   CREATININE 1.46* 1.20*  < > 1.00 0.98 1.02*  CALCIUM 9.0 8.4*  < > 8.3* 8.2* 8.1*  PROT 6.4* 5.3*  --   --   --   --   BILITOT 0.9 0.8  --   --   --   --   ALKPHOS 64 54  --   --   --   --   ALT 13* 10*  --   --   --   --   AST 13* 11*  --   --   --   --   GLUCOSE 408* 243*  < > 305* 225* 251*  < > = values in this interval not displayed.   Imaging/Diagnostic Tests: Dg Elbow Complete Right (3+view): 12/04/2016 FINDINGS: There is no evidence of fracture or dislocation. No osseous erosions are seen to suggest osteomyelitis. The visualized joint spaces are preserved. An apparent elbow joint effusion is noted. Dorsal soft tissue swelling is noted at the elbow.  IMPRESSION:  1. No definite evidence of fracture or dislocation. No osseous erosions seen to suggest osteomyelitis.  2. Apparent elbow joint effusion noted.  3. Dorsal soft tissue swelling at the elbow.   Dg Forearm Right 12/03/2016  FINDINGS: No acute bony abnormality. No erosive changes. No periosteal reaction. No radiopaque foreign body. Nonspecific soft tissue swelling of the proximal forearm. Vascular calcifications.  IMPRESSION: Negative for acute bony abnormality. Nonspecific soft tissue swelling of the proximal forearm. Vascular calcifications.  Dg Wrist Complete Right 12/04/2016 FINDINGS: There is no evidence of fracture or dislocation. No osseous erosions are seen to suggest osteomyelitis. The carpal rows are intact, and demonstrate normal alignment. The joint spaces are preserved. Mild diffuse soft tissue swelling is noted about the wrist. Scattered vascular calcifications are seen.  IMPRESSION:  1. No evidence of fracture or dislocation. No osseous erosions are seen to suggest  osteomyelitis.  2. Mild diffuse soft tissue swelling about the wrist.  3. Scattered vascular calcifications seen.   Dg Tibia/fibula Left 12/04/2016 FINDINGS: There is no evidence of fracture or dislocation. The tibia and fibula appear intact. The ankle mortise is grossly unremarkable in appearance. The knee joint is unremarkable. No knee joint effusion is identified. Known soft tissue wounds are not well characterized on radiograph. IMPRESSION: No evidence of fracture or dislocation.   Dg Hand 2 View Right 12/04/2016 FINDINGS: There is no evidence of fracture or dislocation. No osseous erosions are seen. The joint spaces are preserved. The carpal rows are intact, and demonstrate normal alignment. The soft tissues are unremarkable in appearance.  IMPRESSION: No evidence of fracture or dislocation.  No osseous erosions seen.   Bonnita Hollow, MD 12/10/2016, 6:49 AM PGY-1, [facility] Intern pager: [phone], text pages welcome

## 2016-12-10 NOTE — Progress Notes (Signed)
Inpatient Diabetes Program Recommendations  AACE/ADA: New Consensus Statement on Inpatient Glycemic Control (2015)  Target Ranges:  Prepandial:   less than 140 mg/dL      Peak postprandial:   less than 180 mg/dL (1-2 hours)      Critically ill patients:  140 - 180 mg/dL   Results for DYNASTI, KERMAN (MRN [id number]) as of 12/10/2016 10:37  Ref. Range 12/09/2016 07:41 12/09/2016 12:11 12/09/2016 16:58 12/09/2016 21:53 12/10/2016 07:49  Glucose-Capillary Latest Ref Range: 65 - 99 mg/dL 157 (H) 195 (H) 293 (H) 197 (H) 168 (H)   Review of Glycemic Control  Current orders for Inpatient glycemic control: Lantus 15 units QHS, Novolog 0-20 units TID with meals  Inpatient Diabetes Program Recommendations: Insulin - Basal: Fasting glucose 168 mg/dl. Do not recommend increasing basal insulin at this time. Insulin - Meal Coverage: Post prandial glucose is consistently elevated and ordered steroids. Please consider ordering Novolog 3 units TID with meals for meal coverage if patient eats at least 50% of meals. Insulin-Correction: Please consider ordering Novolog 0-5 units QHS for bedtime correction.  Thanks, Barnie Alderman, RN, MSN, CDE Diabetes Coordinator Inpatient Diabetes Program [phone] (Team Pager from 8am to 5pm)

## 2016-12-11 ENCOUNTER — Ambulatory Visit: Payer: No Typology Code available for payment source | Admitting: Family Medicine

## 2016-12-11 ENCOUNTER — Telehealth: Payer: Self-pay | Admitting: *Deleted

## 2016-12-11 LAB — BASIC METABOLIC PANEL
ANION GAP: 7 (ref 5–15)
BUN: 7 mg/dL (ref 6–20)
CALCIUM: 8.4 mg/dL — AB (ref 8.9–10.3)
CO2: 24 mmol/L (ref 22–32)
Chloride: 107 mmol/L (ref 101–111)
Creatinine, Ser: 0.94 mg/dL (ref 0.44–1.00)
GFR calc Af Amer: >60 mL/min (ref >60)
GLUCOSE: 156 mg/dL — AB (ref 65–99)
Potassium: 3.5 mmol/L (ref 3.5–5.1)
Sodium: 138 mmol/L (ref 135–145)

## 2016-12-11 LAB — CBC
HEMATOCRIT: 24.9 % — AB (ref 36.0–46.0)
Hemoglobin: 8.2 g/dL — ABNORMAL LOW (ref 12.0–15.0)
MCH: 30.1 pg (ref 26.0–34.0)
MCHC: 32.9 g/dL (ref 30.0–36.0)
MCV: 91.5 fL (ref 78.0–100.0)
Platelets: 393 10*3/uL (ref 150–400)
RBC: 2.72 MIL/uL — ABNORMAL LOW (ref 3.87–5.11)
RDW: 14.9 % (ref 11.5–15.5)
WBC: 9.7 10*3/uL (ref 4.0–10.5)

## 2016-12-11 LAB — GLUCOSE, CAPILLARY
Glucose-Capillary: 124 mg/dL — ABNORMAL HIGH (ref 65–99)
Glucose-Capillary: 170 mg/dL — ABNORMAL HIGH (ref 65–99)

## 2016-12-11 MED ORDER — OXYCODONE HCL 5 MG PO TABS: 5.0000 mg | ORAL_TABLET | Freq: Four times a day (QID) | ORAL | 0 refills | Status: DC | PRN

## 2016-12-11 MED ORDER — PREDNISONE 5 MG PO TABS: 15.0000 mg | ORAL_TABLET | Freq: Every day | ORAL | 0 refills | Status: DC

## 2016-12-11 MED ORDER — ACETAMINOPHEN 325 MG PO TABS: 650.0000 mg | ORAL_TABLET | Freq: Four times a day (QID) | ORAL | 1 refills | Status: DC | PRN

## 2016-12-11 MED ORDER — INSULIN DEGLUDEC 100 UNIT/ML ~~LOC~~ SOPN: 15.0000 [IU] | PEN_INJECTOR | Freq: Every day | SUBCUTANEOUS | 0 refills | Status: DC

## 2016-12-11 MED ORDER — DOXYCYCLINE HYCLATE 100 MG PO TABS: 100.0000 mg | ORAL_TABLET | Freq: Two times a day (BID) | ORAL | 0 refills | Status: AC

## 2016-12-11 MED ORDER — POLYETHYLENE GLYCOL 3350 17 G PO PACK: 17.0000 g | PACK | Freq: Every day | ORAL | 0 refills | Status: DC | PRN

## 2016-12-11 MED ORDER — INSULIN GLARGINE 100 UNIT/ML ~~LOC~~ SOLN: 15.0000 [IU] | Freq: Every day | SUBCUTANEOUS | 11 refills | Status: DC

## 2016-12-11 NOTE — Progress Notes (Signed)
Medication Samples have been provided to the patient.  Drug name: Tyler Aas        Strength: 100unit/mL        Qty: 1 pen   LOT: [id number]   Exp.Date: 09/26/2018  Dosing instructions: Inject 15 units total into the skin daily at 10pm  The patient has been instructed regarding the correct time, dose, and frequency of taking this medication, including desired effects and most common side effects.   Carlyle Dolly 3:26 PM 12/11/2016

## 2016-12-11 NOTE — Progress Notes (Addendum)
Family Medicine Teaching Service Daily Progress Note Intern Pager: [phone]  Patient name: Christine [name] Medical record number: [id number] Date of birth: Jun 18, 1970 Age: 46 y.o. Gender: female  Primary Care Provider: Steve Rattler, DO Consultants: None Code Status: Full  Pt Overview and Major Events to Date:  Christine [name] is a 47 y.o. female presenting with 3 day history of right arm swelling, pain, purulent wound drainage. PMH is significant for chronic alcoholic pancreatitis, DMII ( last A1C 6.0.), HLD, tobacco abuse, depression, HTN, Rash  Assessment and Plan:  R Arm Cellulitis: improved Likely 2/2 punch biopsy with poor wound healing from tobacco use, EtOH use, and prednisone. Continues to be afebrile.  Leukocytosis resolved 9.7, from 20 on admit.  XR showed some effusion around elbow, CT showed cellulitis and small joint effusion. Ortho following, no surgical indication at this time.  -tolerating doxycycline and clinically improving - PO doxy - Follow BCx - no growth x5d - Tylenol PRN for mild pain and Oxycodone IR 5 mg PRN for moderate to severe pain  AKI, improved - Cr 0.94, down from 1.46 on admission. Likely prerenal due to dec PO.  - monitor AM BMP - d/c IVF  Anemia- Hgb stable at 8.2 from 8.5.  No indication of bleeding.  - monitor daily Hgb, transfusion threshold 7 - recommend anemia panel f/u as outpt  Chronic diffuse rash: stable uncertain etiology, has been several months duration. No improvement on oral prednisone x3 weeks. Negative RPR, HIV. Tapering steroid from 20 mg to 15mg  - tolerating taper well. Cont as outpatient  Hypertension: stable, 141/69 on 8/15. Home meds include lisinopril-HCTZ 20-12.5 mg.  Held in setting of sepsis on admission. - continue home HCTZ 12.5 mg today -restart lisinopril on discharge, trend Cr as outpt  T2DM with hyperglycemia: improved, CBGs in 170s-mid 200s.Hyperglycemia with prednisone use and infection. Patient on 15u  lantus, received 15u short-acting in past 24s -resistant sliding scale -Carb modified diet -Pt is amenable to using insulin, voiced that Lantus would be very expensive.  -consider NPH 8 units BID, 3 units Novalin R w/ meals, Sliding scale correction (BS 151-200- 2 units, 201-250 4 units, 251-300 6 units, 301-350 8 units, 351-400 10 units) -Will require close CBG monitoring, f/u outpatient as predinose is tapered.    H/o Alcohol Abuse. Pt drinks 5 beers a day. CIWA consistantly 0 - thiamine and folate  Tobacco Abuse - nicotine patch prn  Thrush, improved. In setting of alcoholism, tobacco use, and prednisone use.  - Nystatin mouthwash - neg HIV  LLE wounds. Stable, No purulent discharge. No pain.  - wound care saw on 8/11, will see again on 8/14  Yeast Infection: improved, pt has not endorsed any more discharge. reported white, malodorous discharge, likely yeast infection given prednisone and abx regimen. Given diflucan 150 mg once on 8/13  FEN/GI: Carb modified diet, Prophylaxis: LMWH  Disposition:  Discharge home Subjective:   Patient arm is feeling better. Endorses mild swelling and mild pain. Endorses wanting to go home.  Objective: Temp:  [97.8 F (36.6 C)-98 F (36.7 C)] 98 F (36.7 C) (08/15 8938) Pulse Rate:  [51-57] 57 (08/15 0608) Resp:  [16-18] 18 (08/15 0608) BP: (125-152)/(60-70) 141/69 (08/15 0608) SpO2:  [99 %-100 %] 99 % (08/15 1017)  Physical Exam: GEN: NAD, resting comfortably CARD: RRR, no m/r/g PULM: CTAB, no W/R/R SKIN: mild,improved edema surround right elbow down mid forarm. Near ROM of elbow joint. No purulent drainage from R forearm wound EXT: No  LE edema PSYCH: appropriate affect  Laboratory:  Recent Labs Lab 12/08/16 0527 12/09/16 0241 12/10/16 0308  WBC 8.1 10.2 9.9  HGB 8.4* 8.3* 8.2*  HCT 25.7* 24.9* 24.9*  PLT 283 310 364    Recent Labs Lab 12/08/16 0527 12/09/16 0241 12/10/16 0308  NA 136 136 135  K 3.4* 3.1* 3.3*   CL 108 105 105  CO2 22 23 24   BUN 9 8 9   CREATININE 1.00 0.98 1.02*  CALCIUM 8.3* 8.2* 8.1*  GLUCOSE 305* 225* 251*     Imaging/Diagnostic Tests: Dg Elbow Complete Right (3+view): 12/04/2016 FINDINGS: There is no evidence of fracture or dislocation. No osseous erosions are seen to suggest osteomyelitis. The visualized joint spaces are preserved. An apparent elbow joint effusion is noted. Dorsal soft tissue swelling is noted at the elbow.  IMPRESSION:  1. No definite evidence of fracture or dislocation. No osseous erosions seen to suggest osteomyelitis.  2. Apparent elbow joint effusion noted.  3. Dorsal soft tissue swelling at the elbow.   Dg Forearm Right 12/03/2016  FINDINGS: No acute bony abnormality. No erosive changes. No periosteal reaction. No radiopaque foreign body. Nonspecific soft tissue swelling of the proximal forearm. Vascular calcifications.  IMPRESSION: Negative for acute bony abnormality. Nonspecific soft tissue swelling of the proximal forearm. Vascular calcifications.  Dg Wrist Complete Right 12/04/2016 FINDINGS: There is no evidence of fracture or dislocation. No osseous erosions are seen to suggest osteomyelitis. The carpal rows are intact, and demonstrate normal alignment. The joint spaces are preserved. Mild diffuse soft tissue swelling is noted about the wrist. Scattered vascular calcifications are seen.  IMPRESSION:  1. No evidence of fracture or dislocation. No osseous erosions are seen to suggest osteomyelitis.  2. Mild diffuse soft tissue swelling about the wrist.  3. Scattered vascular calcifications seen.   Dg Tibia/fibula Left 12/04/2016 FINDINGS: There is no evidence of fracture or dislocation. The tibia and fibula appear intact. The ankle mortise is grossly unremarkable in appearance. The knee joint is unremarkable. No knee joint effusion is identified. Known soft tissue wounds are not well characterized on radiograph. IMPRESSION: No evidence of fracture or  dislocation.   Dg Hand 2 View Right 12/04/2016 FINDINGS: There is no evidence of fracture or dislocation. No osseous erosions are seen. The joint spaces are preserved. The carpal rows are intact, and demonstrate normal alignment. The soft tissues are unremarkable in appearance.  IMPRESSION: No evidence of fracture or dislocation.  No osseous erosions seen.   Bonnita Hollow, MD 12/11/2016, 6:52 AM PGY-1, [place] Intern pager: [phone], text pages welcome

## 2016-12-11 NOTE — Progress Notes (Signed)
Called patient's room and asked if she had a glucometer at home. She says that hers at home does not work and she is out of strips. I will work of getting her a new one.   Marny Lowenstein, MD, [place] - PGY1 12/11/2016 11:23 AM

## 2016-12-11 NOTE — Discharge Summary (Signed)
[facility] Discharge Summary  Patient name: Christine [name] Medical record number: [id number] Date of birth: 1971-02-26 Age: 46 y.o. Gender: female Date of Admission: 12/03/2016  Date of Discharge: 12/11/2016 Admitting Physician: Kinnie Feil, MD  Primary Care Provider: Steve Rattler, DO Consultants: Orthopedics  Indication for Hospitalization: Right arm cellulitis  Discharge Diagnoses/Problem List:  -Right arm cellulitis -AKI, resolved - Anemia -Chronic diffuse rash -DMT2 -Hypertension -H/o alcohol abuse -Tobacco abuse -Oral Thrush -Vaginal Candidiasis -LL Extremity wounds   Disposition: Home  Discharge Condition: stable  Discharge Exam:  GEN: NAD, resting comfortably CARD: RRR, no m/r/g PULM: CTAB, no W/R/R SKIN: mild,improved edema surround right elbow down mid forarm. Near ROM of elbow joint. No purulent drainage from R forearm wound EXT: No LE edema PSYCH: appropriate affect  Brief Hospital Course:  Patient presented with 3 days of increased arm pain and swelling of the right forearm with purulent drainage from a recent punch biopsy site. She also had non-healing punch biopsy sites on her left lower leg, but without pain, swelling, or purulent drainage. She initially met sepsis criteria in ED, but VS were stable and afebrile. Patient had been on chronic steroids for systemic rash and was undergoing work up for possible underlying rheumatalgic or connective tissue disorder. On admission labs were significant for leukocytosis to 20 with a neutrophil predominance, creatinine 1.46, sodium 126, chloride 94, bicarbonate 19, glucose 408. Lactic acid 1.84 > 2.42. DG right elbow showed some joint effusion, no evidence of osseous involvement. Patient was started on broad spectrum antibiotics. DG right forearm, right hand, right wrist and left tibia/fibula were also negative for osseous involvement. CT Right arm similarly showed cellulitis from mid to  distal third of the right arm extending into the forearm with small elbow joint effusion. Orthopedics was consulted for possible surgical intervention. Their conclusion was that no surgical indication was needed. Patient continued to improve clinically on broad spectrum antibiotics. She was eventually transitioned to PO doxycycline. Patient had elevated CBGs in hospital. They improved as the cellulitis improved, but remained elevated. Patient was slowly weaned from oral pred 17m to 135m Patient was discharged with basal insulin. At discharge, pain was well controlled with PO pain medications.   Issues for Follow Up:  1. Prednisone taper - Please continue slow prednisone taper over the next month.  2. DM Type II - Patient had new insulin requirement during hospitalization. She was discharged with 15 units lantis daily. Please titrate insulin appropriately to match needs as prednisone is tapered.  3. Right arm, and lower extremity wounds - Patient had nonhealing wound in right arm and left leg. Likely due to steroid use and tobacco abuse. Please continue to monitor these wounds for healing or any signs of recurrent infection. 4. Chronic Rash - Patient had diffuse, systemic, mildly pruritic rash on body. It was being treated with oral prednisone. Please follow the rash along with steroid taper.  5. HTN - Patient home lisinopril was initially held in the hospital due to AKI. HCTZ was restarted. Patient remained normotensive on this single agent. The AKI resolved by discharge and patient was restarted on home lisinopril/HCTZ combination. Please trend BP to see if the HTN is medically optimized.   Significant Procedures: None  Significant Labs and Imaging:   Recent Labs Lab 12/09/16 0241 12/10/16 0308 12/11/16 0556  WBC 10.2 9.9 9.7  HGB 8.3* 8.2* 8.2*  HCT 24.9* 24.9* 24.9*  PLT 310 364 393    Recent Labs  Lab 12/07/16 0218 12/08/16 0527 12/09/16 0241 12/10/16 0308 12/11/16 0556  NA 134*  136 136 135 138  K 3.8 3.4* 3.1* 3.3* 3.5  CL 104 108 105 105 107  CO2 '23 22 23 24 24  ' GLUCOSE 354* 305* 225* 251* 156*  BUN '10 9 8 9 7  ' CREATININE 1.10* 1.00 0.98 1.02* 0.94  CALCIUM 8.7* 8.3* 8.2* 8.1* 8.4*    Results/Tests Pending at Time of Discharge: None  Discharge Medications:  Allergies as of 12/11/2016   No Known Allergies     Medication List    TAKE these medications   ACCU-CHEK ADVANTAGE DIABETES kit Use as instructed   ACCU-CHEK FASTCLIX LANCETS Misc Check sugar 5 x daily   acetaminophen 325 MG tablet Commonly known as:  TYLENOL Take 2 tablets (650 mg total) by mouth every 6 (six) hours as needed for mild pain (or Fever >/= 101).   doxycycline 100 MG tablet Commonly known as:  VIBRA-TABS Take 1 tablet (100 mg total) by mouth every 12 (twelve) hours.   glucose blood test strip Check sugar 5x daily   glucose blood test strip Commonly known as:  ACCU-CHEK AVIVA Check sugar 6 x daily   insulin degludec 100 UNIT/ML Sopn FlexTouch Pen Commonly known as:  TRESIBA FLEXTOUCH Inject 0.15 mLs (15 Units total) into the skin daily at 10 pm.   lisinopril-hydrochlorothiazide 20-12.5 MG tablet Commonly known as:  ZESTORETIC Take 1 tablet by mouth daily. What changed:  when to take this   oxyCODONE 5 MG immediate release tablet Commonly known as:  Oxy IR/ROXICODONE Take 1 tablet (5 mg total) by mouth every 6 (six) hours as needed for severe pain or breakthrough pain.   polyethylene glycol packet Commonly known as:  MIRALAX / GLYCOLAX Take 17 g by mouth daily as needed for mild constipation.   predniSONE 5 MG tablet Commonly known as:  DELTASONE Take 3 tablets (15 mg total) by mouth daily with breakfast. What changed:  medication strength  how much to take       Discharge Instructions: Please refer to Patient Instructions section of EMR for full details.  Patient was counseled important signs and symptoms that should prompt return to medical care,  changes in medications, dietary instructions, activity restrictions, and follow up appointments.   Follow-Up Appointments: Follow-up Information    Steve Rattler, DO Follow up on 12/18/2016.   Specialty:  Family Medicine Why:  Please arrive at 2:10 PM for your appt Contact information: [place] [address] [phone]           Bonnita Hollow, MD 12/11/2016, 11:23 PM PGY-1, [place]

## 2016-12-11 NOTE — Discharge Instructions (Addendum)
You were admitted to the hospital for right arm cellulitis which is a bacterial infection of your skin. You were given antibiotics and the infection as improved. Please continue taking your antibiotic medication (Doxycycline) as prescribed.   For your elevated glucose, we have provided you with a sample insulin. Please take 15 units of this every night and check your glucose every morning. Please record what your sugar is every morning and then bring it with you to your next appt with Dr. Vanetta Shawl. Please see the above date and time for the appt with Dr. Vanetta Shawl.   Reasons to come back to the hospital would be if your infection in your arm comes back or you have a fever.   It was a pleasure caring for you, take care!   Cellulitis, Adult Cellulitis is a skin infection. The infected area is usually red and sore. This condition occurs most often in the arms and lower legs. It is very important to get treated for this condition. Follow these instructions at home:  Take over-the-counter and prescription medicines only as told by your doctor.  If you were prescribed an antibiotic medicine, take it as told by your doctor. Do not stop taking the antibiotic even if you start to feel better.  Drink enough fluid to keep your pee (urine) clear or pale yellow.  Do not touch or rub the infected area.  Raise (elevate) the infected area above the level of your heart while you are sitting or lying down.  Place warm or cold wet cloths (warm or cold compresses) on the infected area. Do this as told by your doctor.  Keep all follow-up visits as told by your doctor. This is important. These visits let your doctor make sure your infection is not getting worse. Contact a doctor if:  You have a fever.  Your symptoms do not get better after 1-2 days of treatment.  Your bone or joint under the infected area starts to hurt after the skin has healed.  Your infection comes back. This can happen in the same area or  another area.  You have a swollen bump in the infected area.  You have new symptoms.  You feel ill and also have muscle aches and pains. Get help right away if:  Your symptoms get worse.  You feel very sleepy.  You throw up (vomit) or have watery poop (diarrhea) for a long time.  There are red streaks coming from the infected area.  Your red area gets larger.  Your red area turns darker. This information is not intended to replace advice given to you by your health care provider. Make sure you discuss any questions you have with your health care provider. Document Released: 10/02/2007 Document Revised: 09/21/2015 Document Reviewed: 02/22/2015 Elsevier Interactive Patient Education  2018 [employer].    Blood Glucose Monitoring, Adult Monitoring your blood sugar (glucose) helps you manage your diabetes. It also helps you and your health care provider determine how well your diabetes management plan is working. Blood glucose monitoring involves checking your blood glucose as often as directed, and keeping a record (log) of your results over time. Why should I monitor my blood glucose? Checking your blood glucose regularly can:  Help you understand how food, exercise, illnesses, and medicines affect your blood glucose.  Let you know what your blood glucose is at any time. You can quickly tell if you are having low blood glucose (hypoglycemia) or high blood glucose (hyperglycemia).  Help you and your  health care provider adjust your medicines as needed.  When should I check my blood glucose? Follow instructions from your health care provider about how often to check your blood glucose. This may depend on:  The type of diabetes you have.  How well-controlled your diabetes is.  Medicines you are taking.  If you have type 2 diabetes:  If you take insulin or other diabetes medicines, check your blood glucose at least once a day  If you are on intensive insulin therapy,  check your blood glucose at least 4 times a day. Occasionally, you may also need to check between 2:00 a.m. and 3:00 a.m., as directed.  Also check your blood glucose: ? Before and after exercise. ? Before potentially dangerous tasks, like driving or using heavy machinery.  You may need to check your blood glucose more often if: ? Your medicine is being adjusted. ? Your diabetes is not well-controlled. ? You are ill. What is a blood glucose log?  A blood glucose log is a record of your blood glucose readings. It helps you and your health care provider: ? Look for patterns in your blood glucose over time. ? Adjust your diabetes management plan as needed.  Every time you check your blood glucose, write down your result and notes about things that may be affecting your blood glucose, such as your diet and exercise for the day.  Most glucose meters store a record of glucose readings in the meter. Some meters allow you to download your records to a computer. How do I check my blood glucose? Follow these steps to get accurate readings of your blood glucose: Supplies needed   Blood glucose meter.  Test strips for your meter. Each meter has its own strips. You must use the strips that come with your meter.  A needle to prick your finger (lancet). Do not use lancets more than once.  A device that holds the lancet (lancing device).  A journal or log book to write down your results. Procedure  Wash your hands with soap and water.  Prick the side of your finger (not the tip) with the lancet. Use a different finger each time.  Gently rub the finger until a small drop of blood appears.  Follow instructions that come with your meter for inserting the test strip, applying blood to the strip, and using your blood glucose meter.  Write down your result and any notes. Alternative testing sites  Some meters allow you to use areas of your body other than your finger (alternative sites) to  test your blood.  If you think you may have hypoglycemia, or if you have hypoglycemia unawareness, do not use alternative sites. Use your finger instead.  Alternative sites may not be as accurate as the fingers, because blood flow is slower in these areas. This means that the result you get may be delayed, and it may be different from the result that you would get from your finger.  The most common alternative sites are: ? Forearm. ? Thigh. ? Palm of the hand. Additional tips  Always keep your supplies with you.  If you have questions or need help, all blood glucose meters have a 24-hour hotline number that you can call. You may also contact your health care provider.  After you use a few boxes of test strips, adjust (calibrate) your blood glucose meter by following instructions that came with your meter. This information is not intended to replace advice given to you by your  health care provider. Make sure you discuss any questions you have with your health care provider. Document Released: 04/18/2003 Document Revised: 11/03/2015 Document Reviewed: 09/25/2015 Elsevier Interactive Patient Education  2017 [employer].

## 2016-12-11 NOTE — Plan of Care (Signed)
Problem: Education: Goal: Verbalization of understanding the information provided will improve Outcome: Progressing Pt will be more knowledgeable about disease process prior to discharge.  Problem: Skin Integrity: Goal: Skin integrity will improve Outcome: Progressing Pt's skin integrity will improve prior to discharge.

## 2016-12-11 NOTE — Telephone Encounter (Signed)
Returned call. LM with family member. I will be on nights for the next 2 weeks. Asked her to call the office back. I will be able to call her back Friday morning.

## 2016-12-11 NOTE — Telephone Encounter (Signed)
Patient called requesting PCP give her a call regarding a couple of her medications. Please call [phone].  Derl Barrow, RN

## 2016-12-13 ENCOUNTER — Telehealth: Payer: Self-pay | Admitting: Family Medicine

## 2016-12-13 NOTE — Telephone Encounter (Signed)
Will forward to MD.  Checked on "good-rx" and the lowest priced coupon was for $115 out of pocket.  Jazmin Hartsell,CMA

## 2016-12-13 NOTE — Telephone Encounter (Signed)
Pt wants to dr riccio about getting a coupon for strips for her glucometer

## 2016-12-13 NOTE — Telephone Encounter (Signed)
I'm sorry unfortunately I do not know of a way to get these cheaper.

## 2016-12-16 NOTE — Telephone Encounter (Signed)
Contacted pt and informed her of the good rx coupon value. Pt stated she no longer needed them, she was able to find her old meter with test strips. Pt stated she would like an insulin pen e-prescribed to her pharmacy, so the pharmacist can tell her how much it will be.

## 2016-12-18 ENCOUNTER — Encounter: Payer: Self-pay | Admitting: Family Medicine

## 2016-12-18 ENCOUNTER — Ambulatory Visit (INDEPENDENT_AMBULATORY_CARE_PROVIDER_SITE_OTHER): Payer: No Typology Code available for payment source | Admitting: Family Medicine

## 2016-12-18 VITALS — BP 124/72 | HR 95 | Temp 98.4°F | Ht 69.0 in | Wt 169.0 lb

## 2016-12-18 DIAGNOSIS — E1140 Type 2 diabetes mellitus with diabetic neuropathy, unspecified: Secondary | ICD-10-CM

## 2016-12-18 DIAGNOSIS — Z794 Long term (current) use of insulin: Secondary | ICD-10-CM

## 2016-12-18 DIAGNOSIS — L03113 Cellulitis of right upper limb: Secondary | ICD-10-CM

## 2016-12-18 DIAGNOSIS — L259 Unspecified contact dermatitis, unspecified cause: Secondary | ICD-10-CM

## 2016-12-18 MED ORDER — PREDNISONE 5 MG PO TABS: 10.0000 mg | ORAL_TABLET | Freq: Every day | ORAL | 0 refills | Status: DC

## 2016-12-18 MED ORDER — DOXYCYCLINE HYCLATE 100 MG PO TABS: 100.0000 mg | ORAL_TABLET | Freq: Two times a day (BID) | ORAL | 0 refills | Status: DC

## 2016-12-18 MED ORDER — INSULIN DEGLUDEC 100 UNIT/ML ~~LOC~~ SOPN: 15.0000 [IU] | PEN_INJECTOR | Freq: Every day | SUBCUTANEOUS | 0 refills | Status: DC

## 2016-12-18 NOTE — Progress Notes (Signed)
    Subjective:    Patient ID: Christine [name], female    DOB: 09-05-1970, 46 y.o.   MRN: [id number]   CC: hosp follow up  Arm cellulitis- Finished doxy yesterday Arm still swollen, tender to touch, erythematous Cannot extend elbow still Patient reports it is slowly improving. She denies it worsening since leaving hospital No fevers or chills  Rash/steroid taper- On 15 mg prednisone with 2 more days left No itching Rash looks same, has not resolved  DM- Taking 15units tresiba every night No symptoms of hypoglycemia  Smoking status reviewed- former smoker  Review of Systems- see HPI   Objective:  BP 124/72   Pulse 95   Temp 98.4 F (36.9 C) (Oral)   Ht 5\' 9"  (1.753 m)   Wt 169 lb (76.7 kg)   LMP 11/27/2016   SpO2 97%   BMI 24.96 kg/m  Vitals and nursing note reviewed  General: pleasant lady in no acute distress Cardiac: RRR, clear S1 and S2, no murmurs, rubs, or gallops Respiratory: clear to auscultation bilaterally, no increased work of breathing Skin: warm and dry. Right arm swollen, erythematous, tender compared to right. Left LE with 2 wounds on background of chronic skin changes. Areas of hyperpigmentation on arms, legs, abdomen, torso Neuro: alert and oriented, no focal deficits  Assessment & Plan:    Chronic contact dermatitis  Persistent, appears to be stable from before hospitalization if not improving some.  -continue tapering steroid- 15mg  x 2 more days then 10mg  x 4, 5 mg x 4, then 2.5 x 4 days before stopping -patient instructed to call if rash flares during taper -follow up 1-2 weeks  Cellulitis of right upper extremity  Completed course of doxycycline yesterday. Concern for persistent infection today as arm is still swollen, red, hot compared to other arm. Vital signs stable, no fever, no hypotension, no tachycardia  -rx for 5 more days of doxycycline sent to pharmacy -ER precautions reviewed with patient for fever or worsening swelling in  arm -follow up 1-2 weeks  Diabetes mellitus with neuropathy (HCC)  Chronic, well controlled prior to hospitalization without medication. Hyperglycemia in hospital secondary to steroid course. Was prescribed Tresiba in hospital. Cannot afford this outpatient.  -finish current course of triseba (5 more days roughly) -complete steroid taper as above -follow up sugars after steroid course done -consider restarting oral medication in future if sugars persistently elevated, however insulin does not seem necessary after steroids are done    Return in about 1 week (around 12/25/2016).   Lucila Maine, DO Family Medicine Resident PGY-2

## 2016-12-18 NOTE — Assessment & Plan Note (Signed)
  Chronic, well controlled prior to hospitalization without medication. Hyperglycemia in hospital secondary to steroid course. Was prescribed Tresiba in hospital. Cannot afford this outpatient.  -finish current course of triseba (5 more days roughly) -complete steroid taper as above -follow up sugars after steroid course done -consider restarting oral medication in future if sugars persistently elevated, however insulin does not seem necessary after steroids are done

## 2016-12-18 NOTE — Assessment & Plan Note (Signed)
  Completed course of doxycycline yesterday. Concern for persistent infection today as arm is still swollen, red, hot compared to other arm. Vital signs stable, no fever, no hypotension, no tachycardia  -rx for 5 more days of doxycycline sent to pharmacy -ER precautions reviewed with patient for fever or worsening swelling in arm -follow up 1-2 weeks

## 2016-12-18 NOTE — Assessment & Plan Note (Signed)
  Persistent, appears to be stable from before hospitalization if not improving some.  -continue tapering steroid- 15mg  x 2 more days then 10mg  x 4, 5 mg x 4, then 2.5 x 4 days before stopping -patient instructed to call if rash flares during taper -follow up 1-2 weeks

## 2016-12-18 NOTE — Patient Instructions (Signed)
  Please call us [phone] if you develop worsening swelling in your arm or fevers/chills.   Please take 10mg  prednisone for 4 days then 5 mg for 4 days then 2.5 for last 4 days. If at any time when going down on the dose you have a "flare" of your rash please call me.  Take doxycycline for next 5 days.  Call me to make an appointment in the next week or so.  Lucila Maine, DO PGY-2, [facility] 12/18/2016 2:59 PM

## 2017-01-13 ENCOUNTER — Ambulatory Visit (INDEPENDENT_AMBULATORY_CARE_PROVIDER_SITE_OTHER): Payer: No Typology Code available for payment source | Admitting: Family Medicine

## 2017-01-13 ENCOUNTER — Encounter: Payer: Self-pay | Admitting: Family Medicine

## 2017-01-13 VITALS — BP 122/89 | HR 91 | Temp 97.8°F | Ht 69.0 in | Wt 146.2 lb

## 2017-01-13 DIAGNOSIS — D649 Anemia, unspecified: Secondary | ICD-10-CM

## 2017-01-13 DIAGNOSIS — R17 Unspecified jaundice: Secondary | ICD-10-CM

## 2017-01-13 DIAGNOSIS — R21 Rash and other nonspecific skin eruption: Secondary | ICD-10-CM

## 2017-01-13 DIAGNOSIS — M792 Neuralgia and neuritis, unspecified: Secondary | ICD-10-CM

## 2017-01-13 DIAGNOSIS — R634 Abnormal weight loss: Secondary | ICD-10-CM | POA: Insufficient documentation

## 2017-01-13 MED ORDER — GABAPENTIN 300 MG PO CAPS: 300.0000 mg | ORAL_CAPSULE | Freq: Three times a day (TID) | ORAL | 2 refills | Status: DC

## 2017-01-13 MED ORDER — PREDNISONE 20 MG PO TABS: 20.0000 mg | ORAL_TABLET | Freq: Every day | ORAL | 1 refills | Status: DC

## 2017-01-13 NOTE — Assessment & Plan Note (Signed)
  Chronic secondary to DM  -refilled gabapentin

## 2017-01-13 NOTE — Patient Instructions (Signed)
   It was good to see you today, sorry your rash is bothering you so much.  Please take the steroids. I also refilled gabapentin for foot pain.  I'll talk to you after you get your imaging done.   If you have questions or concerns please do not hesitate to call at [phone].  Lucila Maine, DO PGY-2, [facility] 01/13/2017 3:58 PM

## 2017-01-13 NOTE — Assessment & Plan Note (Signed)
  Patient down 15-20 pounds over last several months unintentionally. She currently smokes. Has hx of chronic pancreatitis. Also with anemia of unknown origin. Concerning overall for malignancy.  -will check CBC w diff, CMP, and iron studies -patient needs colonoscopy and mammo -CT chest/abdomen/pelvis with contrast  -will follow up with patient regarding results

## 2017-01-13 NOTE — Progress Notes (Signed)
    Subjective:    Patient ID: Christine [name], female    DOB: 04-11-1971, 46 y.o.   MRN: [id number]   CC: follow up arm and rash  Arm is much improved from prior cellulitis. Some pain remains in isolated area in arm. No redness or swelling. No fevers or chills.  Rash has returned since she has been off steroids. Rash is extremely itchy over her arms, legs, back, stomach. She is uncomfortable due to this. Denies other discomfort. Overall feels ok.   Smoking status reviewed- current every day smoker  Review of Systems- denies abdominal pain, joint pains, myalgias   Objective:  BP 122/89 (BP Location: Left Arm, Patient Position: Sitting, Cuff Size: Normal)   Pulse 91   Temp 97.8 F (36.6 C) (Oral)   Ht 5\' 9"  (1.753 m)   Wt 146 lb 3.2 oz (66.3 kg)   SpO2 99%   BMI 21.59 kg/m  Vitals and nursing note reviewed  General: chronically ill appearing lady, thin. In NAD Eyes: sclera appear darkened, slightly jaundiced Cardiac: RRR, clear S1 and S2, no murmurs, rubs, or gallops Respiratory: clear to auscultation bilaterally, no increased work of breathing Extremities: thin extremities with muscle wasting. No edema or cyanosis. Skin: diffuse rash present with hyperpigmentation, evidence of excoriations, over extremities, trunk, abdomen Neuro: alert and oriented, no focal deficits  Assessment & Plan:    Rash and nonspecific skin eruption  Rash worsening off steroids  -resume 20mg  prednisone daily  Neuropathic pain  Chronic secondary to DM  -refilled gabapentin  Abnormal weight loss  Patient down 15-20 pounds over last several months unintentionally. She currently smokes. Has hx of chronic pancreatitis. Also with anemia of unknown origin. Concerning overall for malignancy.  -will check CBC w diff, CMP, and iron studies -patient needs colonoscopy and mammo -CT chest/abdomen/pelvis with contrast  -will follow up with patient regarding results  Return in about 2 weeks (around  01/27/2017).  Lucila Maine, DO Family Medicine Resident PGY-2

## 2017-01-13 NOTE — Assessment & Plan Note (Signed)
  Rash worsening off steroids  -resume 20mg  prednisone daily

## 2017-01-14 LAB — IRON AND TIBC
Iron Saturation: 34 % (ref 15–55)
Iron: 141 ug/dL (ref 27–159)
TIBC: 412 ug/dL (ref 250–450)
UIBC: 271 ug/dL (ref 131–425)

## 2017-01-14 LAB — CMP14+EGFR
A/G RATIO: 1.6 (ref 1.2–2.2)
ALT: 10 IU/L (ref 0–32)
AST: 19 IU/L (ref 0–40)
Albumin: 4.5 g/dL (ref 3.5–5.5)
Alkaline Phosphatase: 125 IU/L — ABNORMAL HIGH (ref 39–117)
BUN/Creatinine Ratio: 8 — ABNORMAL LOW (ref 9–23)
BUN: 11 mg/dL (ref 6–24)
Bilirubin Total: 0.4 mg/dL (ref 0.0–1.2)
CALCIUM: 9.1 mg/dL (ref 8.7–10.2)
CO2: 19 mmol/L — ABNORMAL LOW (ref 20–29)
Chloride: 102 mmol/L (ref 96–106)
Creatinine, Ser: 1.37 mg/dL — ABNORMAL HIGH (ref 0.57–1.00)
GFR, EST AFRICAN AMERICAN: 53 mL/min/{1.73_m2} — AB (ref >59)
GFR, EST NON AFRICAN AMERICAN: 46 mL/min/{1.73_m2} — AB (ref >59)
GLOBULIN, TOTAL: 2.8 g/dL (ref 1.5–4.5)
Glucose: 234 mg/dL — ABNORMAL HIGH (ref 65–99)
POTASSIUM: 4.2 mmol/L (ref 3.5–5.2)
SODIUM: 139 mmol/L (ref 134–144)
Total Protein: 7.3 g/dL (ref 6.0–8.5)

## 2017-01-14 LAB — CBC WITH DIFFERENTIAL/PLATELET
BASOS ABS: 0 10*3/uL (ref 0.0–0.2)
BASOS: 0 %
EOS (ABSOLUTE): 0 10*3/uL (ref 0.0–0.4)
EOS: 1 %
Hematocrit: 32.1 % — ABNORMAL LOW (ref 34.0–46.6)
Hemoglobin: 10 g/dL — ABNORMAL LOW (ref 11.1–15.9)
Immature Grans (Abs): 0 10*3/uL (ref 0.0–0.1)
Immature Granulocytes: 0 %
LYMPHS ABS: 1.3 10*3/uL (ref 0.7–3.1)
Lymphs: 25 %
MCH: 29.2 pg (ref 26.6–33.0)
MCHC: 31.2 g/dL — AB (ref 31.5–35.7)
MCV: 94 fL (ref 79–97)
MONOS ABS: 0.4 10*3/uL (ref 0.1–0.9)
Monocytes: 8 %
NEUTROS PCT: 66 %
Neutrophils Absolute: 3.3 10*3/uL (ref 1.4–7.0)
PLATELETS: 265 10*3/uL (ref 150–379)
RBC: 3.43 x10E6/uL — ABNORMAL LOW (ref 3.77–5.28)
RDW: 15.8 % — ABNORMAL HIGH (ref 12.3–15.4)
WBC: 5.1 10*3/uL (ref 3.4–10.8)

## 2017-01-21 ENCOUNTER — Ambulatory Visit (HOSPITAL_COMMUNITY)
Admission: RE | Admit: 2017-01-21 | Discharge: 2017-01-21 | Disposition: A | Discharge status: Home / Self Care | Payer: Self-pay | Source: Ambulatory Visit | Attending: Family Medicine | Admitting: Family Medicine

## 2017-01-21 DIAGNOSIS — I2510 Atherosclerotic heart disease of native coronary artery without angina pectoris: Secondary | ICD-10-CM | POA: Insufficient documentation

## 2017-01-21 DIAGNOSIS — K861 Other chronic pancreatitis: Secondary | ICD-10-CM | POA: Insufficient documentation

## 2017-01-21 DIAGNOSIS — R634 Abnormal weight loss: Secondary | ICD-10-CM | POA: Insufficient documentation

## 2017-01-21 DIAGNOSIS — R17 Unspecified jaundice: Secondary | ICD-10-CM | POA: Insufficient documentation

## 2017-01-21 DIAGNOSIS — R21 Rash and other nonspecific skin eruption: Secondary | ICD-10-CM | POA: Insufficient documentation

## 2017-01-21 MED ORDER — IOPAMIDOL (ISOVUE-300) INJECTION 61%
INTRAVENOUS | Status: AC
Start: 2017-01-21 — End: 2017-01-21
  Administered 2017-01-21: 100 mL
  Filled 2017-01-21: qty 100

## 2017-01-22 ENCOUNTER — Telehealth: Payer: Self-pay

## 2017-01-22 ENCOUNTER — Other Ambulatory Visit: Payer: Self-pay | Admitting: Family Medicine

## 2017-01-22 NOTE — Telephone Encounter (Signed)
Pt had her CT 9/25.

## 2017-01-22 NOTE — Telephone Encounter (Signed)
-----   Message from Carolan Clines sent at 01/22/2017  9:13 AM EDT ----- Regarding: CT scheduling Hi ladies,  Patient as a Chest CT that was ordered on 9/17 bu not scheduled. Please follow up. Thanks   Tia

## 2017-01-23 ENCOUNTER — Telehealth: Payer: Self-pay | Admitting: Family Medicine

## 2017-01-23 DIAGNOSIS — L298 Other pruritus: Secondary | ICD-10-CM

## 2017-01-23 MED ORDER — PREDNISONE 5 MG PO TABS: 15.0000 mg | ORAL_TABLET | Freq: Every day | ORAL | 1 refills | Status: DC

## 2017-01-23 MED ORDER — TRIAMCINOLONE ACETONIDE 0.025 % EX OINT: 1.0000 "application " | TOPICAL_OINTMENT | Freq: Two times a day (BID) | CUTANEOUS | 1 refills | Status: DC

## 2017-01-23 NOTE — Telephone Encounter (Signed)
Called Christine [name] to relay normal imaging results. Will refer patient to dermatology at [facility] as they can work with her financially, patient has orange card/financial assistance and derm in [place] cannot accommodate financial assistance. Will refill oral steroid at 15 mg daily and see if lower dose controls rash and itching. Will also give topical steroid ointment to use. Patient verbalized understanding and agreement with plan. Asked her to call with any questions or concerns.

## 2017-03-21 ENCOUNTER — Other Ambulatory Visit: Payer: Self-pay | Admitting: Family Medicine

## 2017-04-26 ENCOUNTER — Other Ambulatory Visit: Payer: Self-pay | Admitting: Family Medicine

## 2017-04-26 DIAGNOSIS — R21 Rash and other nonspecific skin eruption: Secondary | ICD-10-CM

## 2017-05-02 NOTE — Telephone Encounter (Signed)
  Called Ms. Chittick after getting refill request for prednisone. She is on 15 mg a day. She never got an appointment with dermatology. Will taper her down to 10 mg daily and refer again to dermatology. Asked patient to let me know how she does on 10 mg. Patient agreed.    Lucila Maine, DO PGY-2, [facility] 05/02/2017 3:25 PM

## 2017-05-28 ENCOUNTER — Ambulatory Visit: Payer: Medicaid Other

## 2017-06-12 ENCOUNTER — Other Ambulatory Visit: Payer: Self-pay | Admitting: Family Medicine

## 2017-07-08 ENCOUNTER — Other Ambulatory Visit: Payer: Self-pay | Admitting: Family Medicine

## 2017-07-10 ENCOUNTER — Ambulatory Visit: Payer: Medicaid Other | Admitting: Family Medicine

## 2017-07-22 ENCOUNTER — Ambulatory Visit: Payer: Medicaid Other | Admitting: Family Medicine

## 2017-08-06 ENCOUNTER — Ambulatory Visit: Payer: Medicaid Other | Admitting: Family Medicine

## 2017-08-13 ENCOUNTER — Ambulatory Visit (INDEPENDENT_AMBULATORY_CARE_PROVIDER_SITE_OTHER): Payer: Self-pay | Admitting: Family Medicine

## 2017-08-13 ENCOUNTER — Other Ambulatory Visit: Payer: Self-pay

## 2017-08-13 ENCOUNTER — Encounter: Payer: Self-pay | Admitting: Family Medicine

## 2017-08-13 VITALS — BP 116/78 | HR 91 | Temp 97.8°F | Ht 69.0 in | Wt 140.0 lb

## 2017-08-13 DIAGNOSIS — E1140 Type 2 diabetes mellitus with diabetic neuropathy, unspecified: Secondary | ICD-10-CM

## 2017-08-13 DIAGNOSIS — L298 Other pruritus: Secondary | ICD-10-CM

## 2017-08-13 LAB — POCT GLYCOSYLATED HEMOGLOBIN (HGB A1C): Hemoglobin A1C: 7.8

## 2017-08-13 MED ORDER — PREDNISONE 1 MG PO TABS: 1.0000 mg | ORAL_TABLET | Freq: Every day | ORAL | 1 refills | Status: DC

## 2017-08-13 MED ORDER — METFORMIN HCL 1000 MG PO TABS: 1000.0000 mg | ORAL_TABLET | Freq: Every day | ORAL | 1 refills | Status: DC

## 2017-08-13 NOTE — Progress Notes (Signed)
    Subjective:    Patient ID: Christine [name], female    DOB: 1971/02/15, 47 y.o.   MRN: [id number]   CC: check up  HPI: Christine [name] presents today to follow up on multiple medical issues. She continues to lose weight, "legs gave out" twice while at home, occasional knee pain, occasional foot swelling after working long shift. She is still taking 10 mg prednisone daily for her rash. She reports rash is well controlled however if she stops steroid it flares back up. She was unable to get in to see dermatology due to loss of insurance. She now has medicaid and is hopeful to get in with them. She overall feels well except for some fatigue. She continues to smoke.   Smoking status reviewed- current smoker  Review of Systems- no fevers, chills, nausea, vomiting, diarrhea, constipation. Endorses fatigue, poor appetite   Objective:  BP 116/78   Pulse 91   Temp 97.8 F (36.6 C) (Oral)   Ht 5\' 9"  (1.753 m)   Wt 140 lb (63.5 kg)   LMP 06/15/2017   SpO2 99%   BMI 20.67 kg/m  Vitals and nursing note reviewed  General: well nourished, in no acute distress HEENT: normocephalic, MMM, moon facies Neck: supple, non-tender, without lymphadenopathy Cardiac: RRR, clear S1 and S2, no murmurs, rubs, or gallops Respiratory: clear to auscultation bilaterally, no increased work of breathing Extremities: thin extremities. No edema or cyanosis Skin: warm and dry. Hyperpigmentation over extremities, trunk with evidence of scarring.  Neuro: alert and oriented, no focal deficits. Strength 5/5 in upper and lower extremities bilaterally    Assessment & Plan:    1. Type 2 diabetes mellitus with diabetic neuropathy, unspecified whether long term insulin use (HCC) A1C elevated today, likely in setting of chronic steroid use. Will add back metformin daily. Taper steroids as below. Follow up 3 months for repeat A1C.  - HgB A1c  2. Chronic pruritic rash in adult Currently suppressed on 10 mg prednisone daily.  Will start taper 1 mg per month. Sent in rx for 1 mg prednisone and went over taper schedule with patient. New referral made to dermatology. Follow up 3 months.  - Ambulatory referral to Dermatology   Return in about 3 months (around 11/12/2017).   Lucila Maine, DO Family Medicine Resident PGY-2

## 2017-08-13 NOTE — Patient Instructions (Addendum)
  Good to see you today!  Let's do the following things:  1- add Metformin 1000 mg every morning with breakfast and we'll see in 3 months how the sugars are doing.  2- pick up 1 mg prednisone tablets. These should be 15 dollars at [facility] with coupon. Start taking 9 mg prednisone every morning for 1 month. Then decrease to 8 mg for 1 month. Then decrease to 7 mg for 1 month and I'll see you back in 3 months. IF the rash comes back during the decreased dose, go back to the higher dose that kept it under control until I see you.  3- see dermatology, I put in a new referral and you should get a phone call about this  4- add sugar free boost or ensure or yogurt drink twice a day to help with weight gain.   If you have questions or concerns please do not hesitate to call at [phone].  Lucila Maine, DO PGY-2, [facility] 08/13/2017 10:42 AM

## 2017-09-16 ENCOUNTER — Telehealth: Payer: Self-pay | Admitting: Family Medicine

## 2017-09-16 NOTE — Telephone Encounter (Signed)
Pt would like Riccio to call her to discuss her diabetes and something that is on her leg. Please call pt back to discuss.

## 2017-09-16 NOTE — Telephone Encounter (Signed)
Spoke with patient and she states that the spots on her legs are coming back and they are now on her arms as well.  She has been able to pick up both the 5mg  and 1mg  of prednisone but would like to know how you want her to take it.  Also she said that she would like some guidance on how to put some weight on.  She feels that she is losing more weight but only has family planning medicaid.  Christine [name],CMA

## 2017-09-17 NOTE — Telephone Encounter (Signed)
She should return to the dose that suppressed the rash- if she went down to 9 mg she should go back up to 10 mg. For weight, we've discussed adding ensure/boost between meals in the past but I know these are costly. If she can't increase volume of food at meals I would try increasing number of meals throughout day and adding snacks.

## 2017-09-17 NOTE — Telephone Encounter (Signed)
LM for patient to call back. Jazmin Hartsell,CMA  

## 2017-11-01 ENCOUNTER — Encounter (HOSPITAL_COMMUNITY): Payer: Self-pay

## 2017-11-01 ENCOUNTER — Emergency Department (HOSPITAL_COMMUNITY): Payer: Self-pay

## 2017-11-01 ENCOUNTER — Emergency Department (HOSPITAL_COMMUNITY)
Admission: EM | Admit: 2017-11-01 | Discharge: 2017-11-01 | Disposition: A | Discharge status: Home / Self Care | Payer: Self-pay | Attending: Emergency Medicine | Admitting: Emergency Medicine

## 2017-11-01 DIAGNOSIS — I10 Essential (primary) hypertension: Secondary | ICD-10-CM | POA: Insufficient documentation

## 2017-11-01 DIAGNOSIS — Z79899 Other long term (current) drug therapy: Secondary | ICD-10-CM | POA: Insufficient documentation

## 2017-11-01 DIAGNOSIS — Z7984 Long term (current) use of oral hypoglycemic drugs: Secondary | ICD-10-CM | POA: Insufficient documentation

## 2017-11-01 DIAGNOSIS — J189 Pneumonia, unspecified organism: Secondary | ICD-10-CM | POA: Insufficient documentation

## 2017-11-01 DIAGNOSIS — J181 Lobar pneumonia, unspecified organism: Secondary | ICD-10-CM

## 2017-11-01 DIAGNOSIS — F1010 Alcohol abuse, uncomplicated: Secondary | ICD-10-CM | POA: Insufficient documentation

## 2017-11-01 DIAGNOSIS — F329 Major depressive disorder, single episode, unspecified: Secondary | ICD-10-CM | POA: Insufficient documentation

## 2017-11-01 DIAGNOSIS — R0789 Other chest pain: Secondary | ICD-10-CM | POA: Insufficient documentation

## 2017-11-01 DIAGNOSIS — F17210 Nicotine dependence, cigarettes, uncomplicated: Secondary | ICD-10-CM | POA: Insufficient documentation

## 2017-11-01 DIAGNOSIS — E119 Type 2 diabetes mellitus without complications: Secondary | ICD-10-CM | POA: Insufficient documentation

## 2017-11-01 LAB — BASIC METABOLIC PANEL
ANION GAP: 9 (ref 5–15)
BUN: 9 mg/dL (ref 6–20)
CHLORIDE: 95 mmol/L — AB (ref 98–111)
CO2: 28 mmol/L (ref 22–32)
Calcium: 8.5 mg/dL — ABNORMAL LOW (ref 8.9–10.3)
Creatinine, Ser: 0.94 mg/dL (ref 0.44–1.00)
GFR calc non Af Amer: >60 mL/min (ref >60)
GLUCOSE: 415 mg/dL — AB (ref 70–99)
POTASSIUM: 4.3 mmol/L (ref 3.5–5.1)
Sodium: 132 mmol/L — ABNORMAL LOW (ref 135–145)

## 2017-11-01 LAB — HEPATIC FUNCTION PANEL
ALK PHOS: 87 U/L (ref 38–126)
ALT: 11 U/L (ref 0–44)
AST: 29 U/L (ref 15–41)
Albumin: 2.6 g/dL — ABNORMAL LOW (ref 3.5–5.0)
BILIRUBIN DIRECT: 0.5 mg/dL — AB (ref 0.0–0.2)
BILIRUBIN INDIRECT: 0.7 mg/dL (ref 0.3–0.9)
Total Bilirubin: 1.2 mg/dL (ref 0.3–1.2)
Total Protein: 5.7 g/dL — ABNORMAL LOW (ref 6.5–8.1)

## 2017-11-01 LAB — LIPASE, BLOOD: Lipase: 18 U/L (ref 11–51)

## 2017-11-01 LAB — RAPID URINE DRUG SCREEN, HOSP PERFORMED
AMPHETAMINES: NOT DETECTED
Benzodiazepines: NOT DETECTED
COCAINE: NOT DETECTED
OPIATES: NOT DETECTED
TETRAHYDROCANNABINOL: NOT DETECTED

## 2017-11-01 LAB — URINALYSIS, ROUTINE W REFLEX MICROSCOPIC
BACTERIA UA: NONE SEEN
BILIRUBIN URINE: NEGATIVE
Glucose, UA: >=500 mg/dL — AB
HGB URINE DIPSTICK: NEGATIVE
KETONES UR: NEGATIVE mg/dL
LEUKOCYTES UA: NEGATIVE
Nitrite: NEGATIVE
PH: 6 (ref 5.0–8.0)
Protein, ur: NEGATIVE mg/dL
SPECIFIC GRAVITY, URINE: 1.023 (ref 1.005–1.030)

## 2017-11-01 LAB — CBC
HEMATOCRIT: 31.3 % — AB (ref 36.0–46.0)
Hemoglobin: 10.3 g/dL — ABNORMAL LOW (ref 12.0–15.0)
MCH: 31.5 pg (ref 26.0–34.0)
MCHC: 32.9 g/dL (ref 30.0–36.0)
MCV: 95.7 fL (ref 78.0–100.0)
Platelets: 210 10*3/uL (ref 150–400)
RBC: 3.27 MIL/uL — ABNORMAL LOW (ref 3.87–5.11)
RDW: 16 % — AB (ref 11.5–15.5)
WBC: 7 10*3/uL (ref 4.0–10.5)

## 2017-11-01 LAB — I-STAT TROPONIN, ED
Troponin i, poc: 0 ng/mL (ref 0.00–0.08)
Troponin i, poc: 0 ng/mL (ref 0.00–0.08)

## 2017-11-01 LAB — D-DIMER, QUANTITATIVE (NOT AT ARMC): D DIMER QUANT: 3.22 ug{FEU}/mL — AB (ref 0.00–0.50)

## 2017-11-01 LAB — I-STAT BETA HCG BLOOD, ED (MC, WL, AP ONLY): HCG, QUANTITATIVE: 8.8 m[IU]/mL — AB (ref <5)

## 2017-11-01 LAB — CBG MONITORING, ED: GLUCOSE-CAPILLARY: 233 mg/dL — AB (ref 70–99)

## 2017-11-01 MED ORDER — IOPAMIDOL (ISOVUE-370) INJECTION 76%
INTRAVENOUS | Status: AC
  Filled 2017-11-01: qty 100

## 2017-11-01 MED ORDER — DOXYCYCLINE HYCLATE 100 MG PO CAPS: 100.0000 mg | ORAL_CAPSULE | Freq: Two times a day (BID) | ORAL | 0 refills | Status: DC

## 2017-11-01 MED ORDER — IOPAMIDOL (ISOVUE-370) INJECTION 76%
100.0000 mL | Freq: Once | INTRAVENOUS | Status: AC | PRN
  Administered 2017-11-01: 100 mL via INTRAVENOUS

## 2017-11-01 MED ORDER — AZITHROMYCIN 500 MG IV SOLR
500.0000 mg | Freq: Once | INTRAVENOUS | Status: AC
  Administered 2017-11-01: 500 mg via INTRAVENOUS
  Filled 2017-11-01: qty 500

## 2017-11-01 MED ORDER — AMOXICILLIN-POT CLAVULANATE 875-125 MG PO TABS: 1.0000 | ORAL_TABLET | Freq: Two times a day (BID) | ORAL | 0 refills | Status: DC

## 2017-11-01 MED ORDER — KETOROLAC TROMETHAMINE 30 MG/ML IJ SOLN
30.0000 mg | Freq: Once | INTRAMUSCULAR | Status: AC
  Administered 2017-11-01: 30 mg via INTRAVENOUS
  Filled 2017-11-01: qty 1

## 2017-11-01 MED ORDER — SODIUM CHLORIDE 0.9 % IV SOLN
1.0000 g | Freq: Once | INTRAVENOUS | Status: AC
  Administered 2017-11-01: 1 g via INTRAVENOUS
  Filled 2017-11-01: qty 10

## 2017-11-01 NOTE — ED Notes (Signed)
Ambulated Pt. down Pod C hallway while on pulse ox.  SpO2 started at 95% and dropped to 93%. Pt. denied any dizziness, SOB, or CP while ambulating.

## 2017-11-01 NOTE — ED Notes (Signed)
Contacted main lab to add on blood work. 

## 2017-11-01 NOTE — ED Notes (Signed)
Patient transported to CT 

## 2017-11-01 NOTE — ED Provider Notes (Signed)
[facility] EMERGENCY DEPARTMENT Provider Note   CSN: [id number] Arrival date & time: 11/01/17  0038     History   Chief Complaint No chief complaint on file.   HPI Christine [name] is a 47 y.o. female.  Patient presents with 3 days of left-sided chest pain that radiates around to her back.  Reports the pain is been fairly constant but waxes and wanes in intensity.  She denies any falls or trauma.  She took some Tums at home without relief.  She has a cough which she attributes to her smoking.  No fever.  No shortness of breath, nausea, diaphoresis.  Pain is worse with palpation of her chest wall with movement of the torso.  She has not seen any rash.  She denies any cardiac history.  She does have a history of diabetes and hypertension.  Denies any cocaine abuse.  She is never had a stress test.  She denies any abdominal pain, nausea, vomiting or fever.  EMS reports EKG was normal sinus rhythm.  Pain seems to be worse at night.  The history is provided by the patient.    Past Medical History:  Diagnosis Date  . Alcohol abuse   . Cellulitis 11/2016   RIGHT ARM  . Chronic pancreatitis ([place])   . Diabetes mellitus   . Hypertension   . Substance abuse [facility])     Patient Active Problem List   Diagnosis Date Noted  . Abnormal weight loss 01/13/2017  . Chronic contact dermatitis 11/19/2016  . Bilateral knee pain 12/30/2014  . Tinea versicolor 08/31/2014  . Encounter for smoking cessation counseling 01/13/2014  . Health care maintenance 01/13/2014  . 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 Vitamin D deficiency 11/01/2013  . Lower extremity neuropathy 10/28/2013  . Rash and nonspecific skin eruption 10/28/2013  . Venous insufficiency 02/05/2013  . Essential hypertension, benign 12/25/2012  . Chronic alcoholic  pancreatitis 02/63/7858  . Protein-calorie malnutrition, severe ([place]) 11/18/2012  . Depression 09/29/2012  . Abdominal pain,  unspecified site 07/18/2012  . Neuropathic pain 01/29/2011  . DRUG ABUSE, HX OF 07/05/2009  . ANEMIA 09/28/2008  . HYPERLIPIDEMIA 09/12/2008  . TOBACCO USER 09/12/2008  . 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 BREAST REDUCTION SURGERY       OB History   None      Home Medications    Prior to Admission medications   Medication Sig Start Date End Date Taking? Authorizing Provider  ACCU-CHEK FASTCLIX LANCETS MISC Check sugar 5 x daily 12/12/14   Leone Brand, MD  acetaminophen (TYLENOL) 325 MG tablet Take 2 tablets (650 mg total) by mouth every 6 (six) hours as needed for mild pain (or Fever >/= 101). 12/11/16   Bonnita Hollow, MD  Blood Glucose Monitoring Suppl (ACCU-CHEK ADVANTAGE DIABETES) kit Use as instructed 10/28/13   Raoul Pitch, Renee A, DO  gabapentin (NEURONTIN) 300 MG capsule TAKE 1 CAPSULE BY MOUTH THREE TIMES A DAY 06/12/17   Lucila Maine C, DO  glucose blood (ACCU-CHEK AVIVA) test strip Check sugar 6 x daily 05/10/15   Melancon, York Ram, MD  glucose blood test strip Check sugar 5x daily 05/03/15   Leone Brand, MD  lisinopril-hydrochlorothiazide (ZESTORETIC) 20-12.5 MG tablet Take 1 tablet by mouth daily. Patient taking differently: Take 1 tablet by mouth at bedtime.  11/08/16   Steve Rattler, DO  metFORMIN (GLUCOPHAGE) 1000 MG tablet Take  1 tablet (1,000 mg total) by mouth daily with breakfast. 08/13/17   Lucila Maine C, DO  oxyCODONE (OXY IR/ROXICODONE) 5 MG immediate release tablet Take 1 tablet (5 mg total) by mouth every 6 (six) hours as needed for severe pain or breakthrough pain. 12/11/16   Bonnita Hollow, MD  polyethylene glycol [facility] Floria Raveling) packet Take 17 g by mouth daily as needed for mild constipation. 12/11/16   Bonnita Hollow, MD  predniSONE (DELTASONE) 1 MG tablet Take 1 tablet (1 mg total) by mouth daily with breakfast. 08/13/17   Lucila Maine C, DO  predniSONE (DELTASONE)  5 MG tablet TAKE 2 TABLETS (10 MG TOTAL) BY MOUTH DAILY WITH BREAKFAST. 07/08/17   Riccio, Angela C, DO  triamcinolone (KENALOG) 0.025 % ointment Apply 1 application topically 2 (two) times daily. 01/23/17   Steve Rattler, DO    Family History History reviewed. No pertinent family history.  Social History Social History   Tobacco Use  . Smoking status: Current Every Day Smoker    Packs/day: 0.50    Years: 20.00    Pack years: 10.00    Types: Cigarettes  . Smokeless tobacco: Never Used  . Tobacco comment: working on quitting  Substance Use Topics  . Alcohol use: Yes    Alcohol/week: 0.0 oz    Comment: 11/2016 " i DRINK LESS THAN i USED TO,BUT i DRINK BEER EVERYDAY  . Drug use: No     Allergies   Patient has no known allergies.   Review of Systems Review of Systems  Constitutional: Negative for activity change, appetite change and fatigue.  HENT: Negative for congestion and rhinorrhea.   Eyes: Negative for visual disturbance.  Respiratory: Positive for chest tightness. Negative for shortness of breath.   Cardiovascular: Positive for chest pain.  Gastrointestinal: Negative for abdominal pain, nausea and vomiting.  Genitourinary: Negative for dysuria, hematuria, vaginal bleeding and vaginal discharge.  Musculoskeletal: Negative for arthralgias, back pain and myalgias.  Skin: Negative for rash.  Neurological: Negative for dizziness, weakness and headaches.    all other systems are negative except as noted in the HPI and PMH.    Physical Exam Updated Vital Signs BP (!) 146/86   Pulse 84   Temp 97.7 F (36.5 C) (Oral)   Resp 19   Ht '5\' 9"'  (1.753 m)   Wt 64.9 kg (143 lb)   SpO2 95%   BMI 21.12 kg/m   Physical Exam  Constitutional: She is oriented to person, place, and time. She appears well-developed and well-nourished. No distress.  HENT:  Head: Normocephalic and atraumatic.  Mouth/Throat: Oropharynx is clear and moist. No oropharyngeal exudate.  Eyes: Pupils  are equal, round, and reactive to light. Conjunctivae and EOM are normal.  Neck: Normal range of motion. Neck supple.  No meningismus.  Cardiovascular: Normal rate, regular rhythm, normal heart sounds and intact distal pulses.  No murmur heard. Pulmonary/Chest: Effort normal and breath sounds normal. No respiratory distress. She exhibits tenderness.  Chaperone present.  There is tenderness to palpation underneath left breast along ribs and extends to mid back.  There is no visible rash.  Abdominal: Soft. There is no tenderness. There is no rebound and no guarding.  Musculoskeletal: Normal range of motion. She exhibits no edema or tenderness.  Neurological: She is alert and oriented to person, place, and time. No cranial nerve deficit. She exhibits normal muscle tone. Coordination normal.   5/5 strength throughout. CN 2-12 intact.Equal grip strength.   Skin: Skin  is warm. Capillary refill takes less than 2 seconds. No rash noted.  Psychiatric: She has a normal mood and affect. Her behavior is normal.  Nursing note and vitals reviewed.    ED Treatments / Results  Labs (all labs ordered are listed, but only abnormal results are displayed) Labs Reviewed  BASIC METABOLIC PANEL - Abnormal; Notable for the following components:      Result Value   Sodium 132 (*)    Chloride 95 (*)    Glucose, Bld 415 (*)    Calcium 8.5 (*)    All other components within normal limits  CBC - Abnormal; Notable for the following components:   RBC 3.27 (*)    Hemoglobin 10.3 (*)    HCT 31.3 (*)    RDW 16.0 (*)    All other components within normal limits  D-DIMER, QUANTITATIVE (NOT AT [facility]) - Abnormal; Notable for the following components:   D-Dimer, Quant 3.22 (*)    All other components within normal limits  RAPID URINE DRUG SCREEN, HOSP PERFORMED - Abnormal; Notable for the following components:   Barbiturates   (*)    Value: Result not available. Reagent lot number recalled by manufacturer.   All  other components within normal limits  URINALYSIS, ROUTINE W REFLEX MICROSCOPIC - Abnormal; Notable for the following components:   Glucose, UA >=500 (*)    All other components within normal limits  HEPATIC FUNCTION PANEL - Abnormal; Notable for the following components:   Total Protein 5.7 (*)    Albumin 2.6 (*)    Bilirubin, Direct 0.5 (*)    All other components within normal limits  I-STAT BETA HCG BLOOD, ED ([facility], [facility], [facility] ONLY) - Abnormal; Notable for the following components:   I-stat hCG, quantitative 8.8 (*)    All other components within normal limits  LIPASE, BLOOD  I-STAT TROPONIN, ED  CBG MONITORING, ED  I-STAT TROPONIN, ED    EKG EKG Interpretation  Date/Time:  Saturday November 01 2017 01:42:28 EDT Ventricular Rate:  74 PR Interval:    QRS Duration: 90 QT Interval:  402 QTC Calculation: 446 R Axis:   59 Text Interpretation:  Sinus rhythm Prolonged PR interval Consider left atrial enlargement Anteroseptal infarct, old Partial missing lead(s): V2 V3 V4 V5 V6 No significant change was found Confirmed by Ezequiel Essex [phone]) on 11/01/2017 1:46:39 AM   Radiology Dg Chest 2 View  Result Date: 11/01/2017 CLINICAL DATA:  Chest pain EXAM: CHEST - 2 VIEW COMPARISON:  CT 01/21/2017 FINDINGS: No pleural effusion. Interstitial and alveolar disease at the left lower lobe. Normal heart size. No pneumothorax. IMPRESSION: Left lower lobe infiltrate Electronically Signed   By: Donavan Foil M.D.   On: 11/01/2017 01:09   Ct Angio Chest Pe W And/or Wo Contrast  Result Date: 11/01/2017 CLINICAL DATA:  Chest pain for 3 days. Central chest pain radiates to the back. Tender on palpation. Positive D-dimer. EXAM: CT ANGIOGRAPHY CHEST WITH CONTRAST TECHNIQUE: Multidetector CT imaging of the chest was performed using the standard protocol during bolus administration of intravenous contrast. Multiplanar CT image reconstructions and MIPs were obtained to evaluate the vascular anatomy. CONTRAST:   144m ISOVUE-370 IOPAMIDOL (ISOVUE-370) INJECTION 76% COMPARISON:  01/21/2017 FINDINGS: Cardiovascular: Good opacification of the central and segmental pulmonary arteries. No focal filling defects. No evidence of significant pulmonary embolus. Normal caliber thoracic aorta. No aortic dissection. Great vessel origins are patent. Normal heart size. No pericardial effusions. Coronary artery calcifications. Mediastinum/Nodes: No significant lymphadenopathy in the  chest. Esophagus is decompressed. Thyroid gland is unremarkable. Lungs/Pleura: Airspace disease and consolidation of the left lower lung with patchy airspace disease in the left lingula, right middle lobe, and right lower lung. Changes are consistent with multifocal pneumonia. No pleural effusions. No pneumothorax. Airways are patent. Upper Abdomen: Visualized upper abdominal contents are unremarkable. Musculoskeletal: No chest wall abnormality. No acute or significant osseous findings. Review of the MIP images confirms the above findings. IMPRESSION: 1. No evidence of significant pulmonary embolus. 2. Lobar pneumonia in the left lower lobe with patchy multifocal pneumonia demonstrated bilaterally. Followup PA and lateral chest X-ray is recommended in 3-4 weeks following appropriate clinical therapy to ensure resolution and exclude underlying malignancy. 3. Coronary artery calcification. Electronically Signed   By: Lucienne Capers M.D.   On: 11/01/2017 04:11    Procedures Procedures (including critical care time)  Medications Ordered in ED Medications - No data to display   Initial Impression / Assessment and Plan / ED Course  I have reviewed the triage vital signs and the nursing notes.  Pertinent labs & imaging results that were available during my care of the patient were reviewed by me and considered in my medical decision making (see chart for details).    3 days of left-sided chest pain that radiates to the back.  It is reproducible with  palpation.  EKG is sinus rhythm.  Shingles considered but there is no rash visible.  Chest x-ray is concerning for left lobe infiltrate which correlates with patient's area of pain.  No acute ST changes.  Troponin is negative after 3 days of constant pain.  Patient will be treated for pneumonia.  CT scan shows no pulmonary embolism.  Does confirm left-sided pneumonia.  Patient feels improved after IV fluids and antibiotics.  She is able to ambulate without desaturation.  Work of breathing has improved and she is breathing in the 20s.  Discussed admission versus discharge with antibiotics.  Patient wishes to go home with p.o. antibiotics.  States she is breathing comfortably and does not want to come in the hospital. Troponin negative x2. CBG elevated without evidence of DKA.  Return precautions discussed including worsening difficulty breathing, fever, worsening pain or any other concerns. Final Clinical Impressions(s) / ED Diagnoses   Final diagnoses:  Community acquired pneumonia of left lower lobe of lung ([place])  Atypical chest pain    ED Discharge Orders    None       Sarvesh Meddaugh, Annie Main, MD 11/01/17 0710

## 2017-11-01 NOTE — Discharge Instructions (Signed)
Take the antibiotics as prescribed and follow-up with your doctor.  Return to the ED if you develop worsening difficulty breathing, chest pain or any other concerns.

## 2017-11-01 NOTE — ED Triage Notes (Signed)
Pt. Arrived by EMS from home with CP x3 days. Pt. Describes it as central chest pain that radiates to back. Pt. Reports it being tender upon palpation. A/0 X4  CBG 481

## 2017-11-03 ENCOUNTER — Telehealth: Payer: Self-pay

## 2017-11-03 MED ORDER — LEVOFLOXACIN 500 MG PO TABS: 500.0000 mg | ORAL_TABLET | Freq: Every day | ORAL | 0 refills | Status: DC

## 2017-11-03 NOTE — Telephone Encounter (Signed)
Pt contacted and informed of new rx to pharmacy.

## 2017-11-03 NOTE — Telephone Encounter (Signed)
Pt called nurse line stating she was seen in the ED over the week for pneumonia. Pt was prescribed Augmentin, however, pt can not afford this. Pt only has orange card. Do you know of something else that can be called in that has little copay?

## 2017-11-03 NOTE — Telephone Encounter (Signed)
  I sent in prescription for levaquin 500 mg, take 1 tablet daily for 7 days. From goodrx it looks like at [place] this medication costs about 9 dollars. Sent to [facility] on [address].   Patient is immunocompromised on chronic steroids so this is the best choice for her.   Lucila Maine, DO PGY-2, [facility] 11/03/2017 9:00 AM

## 2017-11-05 ENCOUNTER — Other Ambulatory Visit: Payer: Self-pay | Admitting: Family Medicine

## 2018-02-10 ENCOUNTER — Other Ambulatory Visit: Payer: Self-pay | Admitting: Family Medicine

## 2018-03-09 ENCOUNTER — Ambulatory Visit: Payer: Medicaid Other | Admitting: Family Medicine

## 2018-03-29 ENCOUNTER — Other Ambulatory Visit: Payer: Self-pay

## 2018-03-29 ENCOUNTER — Encounter (HOSPITAL_COMMUNITY): Payer: Self-pay | Admitting: Emergency Medicine

## 2018-03-29 ENCOUNTER — Emergency Department (HOSPITAL_COMMUNITY)
Admission: EM | Admit: 2018-03-29 | Discharge: 2018-03-30 | Disposition: A | Discharge status: Home / Self Care | Payer: Medicaid Other | Attending: Emergency Medicine | Admitting: Emergency Medicine

## 2018-03-29 DIAGNOSIS — F17210 Nicotine dependence, cigarettes, uncomplicated: Secondary | ICD-10-CM | POA: Insufficient documentation

## 2018-03-29 DIAGNOSIS — Z789 Other specified health status: Secondary | ICD-10-CM

## 2018-03-29 DIAGNOSIS — F1099 Alcohol use, unspecified with unspecified alcohol-induced disorder: Secondary | ICD-10-CM | POA: Insufficient documentation

## 2018-03-29 DIAGNOSIS — Z7289 Other problems related to lifestyle: Secondary | ICD-10-CM

## 2018-03-29 DIAGNOSIS — G5793 Unspecified mononeuropathy of bilateral lower limbs: Secondary | ICD-10-CM

## 2018-03-29 DIAGNOSIS — M79671 Pain in right foot: Secondary | ICD-10-CM

## 2018-03-29 DIAGNOSIS — M79672 Pain in left foot: Secondary | ICD-10-CM | POA: Insufficient documentation

## 2018-03-29 DIAGNOSIS — M545 Low back pain, unspecified: Secondary | ICD-10-CM

## 2018-03-29 DIAGNOSIS — F1090 Alcohol use, unspecified, uncomplicated: Secondary | ICD-10-CM

## 2018-03-29 DIAGNOSIS — G629 Polyneuropathy, unspecified: Secondary | ICD-10-CM | POA: Insufficient documentation

## 2018-03-29 DIAGNOSIS — E119 Type 2 diabetes mellitus without complications: Secondary | ICD-10-CM | POA: Insufficient documentation

## 2018-03-29 DIAGNOSIS — I10 Essential (primary) hypertension: Secondary | ICD-10-CM | POA: Insufficient documentation

## 2018-03-29 DIAGNOSIS — Z79899 Other long term (current) drug therapy: Secondary | ICD-10-CM | POA: Insufficient documentation

## 2018-03-29 LAB — I-STAT BETA HCG BLOOD, ED (MC, WL, AP ONLY)

## 2018-03-29 NOTE — ED Notes (Signed)
Nurse starting IV and drawing labs. 

## 2018-03-29 NOTE — ED Provider Notes (Signed)
[name] EMERGENCY DEPARTMENT Provider Note   CSN: [id number] Arrival date & time: 03/29/18  2216     History   Chief Complaint Chief Complaint  Patient presents with  . Back Pain  . Leg Pain    HPI Christine [name] is a 47 y.o. female past medical history of alcohol abuse, lower extremity neuropathy, chronic pancreatitis, DM 2, hypertension, substance abuse, immunosuppressed on chronic prednisone, who presents today for evaluation of abdominal pain, back pain and leg pain.  She reports that yesterday she started having lower back pain with nausea.  Today her pain radiated to the bottoms of her bilateral feet.  Her pain is 8/10.  She tried tylenol and goodys powder at home with out relief.  She reports that she has not recently missed any doses of her steroids.  She denies any trauma.  She reports no newchanges to bowl or bladder function, says that occasionally she has urgency of bowel movements but this has not changed, no recent fecal or urinary incontinence.  She denies numbness or change in sensation over upper inner thighs or genitals.     HPI  Past Medical History:  Diagnosis Date  . Alcohol abuse   . Cellulitis 11/2016   RIGHT ARM  . Chronic pancreatitis ([place])   . Diabetes mellitus   . Hypertension   . Substance abuse [facility])     Patient Active Problem List   Diagnosis Date Noted  . Alcohol use 03/30/2018  . Abnormal weight loss 01/13/2017  . Chronic contact dermatitis 11/19/2016  . Bilateral knee pain 12/30/2014  . Tinea versicolor 08/31/2014  . Encounter for smoking cessation counseling 01/13/2014  . Health care maintenance 01/13/2014  . 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 Vitamin D deficiency 11/01/2013  . Lower extremity neuropathy 10/28/2013  . Rash and nonspecific skin eruption 10/28/2013  . Venous insufficiency 02/05/2013  . Essential hypertension, benign 12/25/2012  . Chronic alcoholic  pancreatitis  49/67/5916  . Protein-calorie malnutrition, severe ([place]) 11/18/2012  . Depression 09/29/2012  . Abdominal pain, unspecified site 07/18/2012  . Neuropathic pain 01/29/2011  . DRUG ABUSE, HX OF 07/05/2009  . ANEMIA 09/28/2008  . HYPERLIPIDEMIA 09/12/2008  . TOBACCO USER 09/12/2008  . Diabetes mellitus with neuropathy ([place]) 06/26/2006    Past Surgical History:  Procedure Laterality Date  . BREAST REDUCTION SURGERY       OB History   None      Home Medications    Prior to Admission medications   Medication Sig Start Date End Date Taking? Authorizing Provider  ACCU-CHEK FASTCLIX LANCETS MISC Check sugar 5 x daily 12/12/14   Leone Brand, MD  acetaminophen (TYLENOL) 325 MG tablet Take 2 tablets (650 mg total) by mouth every 6 (six) hours as needed for mild pain (or Fever >/= 101). 12/11/16   Bonnita Hollow, MD  Blood Glucose Monitoring Suppl (ACCU-CHEK ADVANTAGE DIABETES) kit Use as instructed 10/28/13   Raoul Pitch, Renee A, DO  doxycycline (VIBRAMYCIN) 100 MG capsule Take 1 capsule (100 mg total) by mouth 2 (two) times daily. 11/01/17   Rancour, Annie Main, MD  gabapentin (NEURONTIN) 300 MG capsule TAKE 1 CAPSULE BY MOUTH THREE TIMES A DAY 06/12/17   Lucila Maine C, DO  glucose blood (ACCU-CHEK AVIVA) test strip Check sugar 6 x daily 05/10/15   Melancon, York Ram, MD  glucose blood test strip Check sugar 5x daily 05/03/15   Leone Brand, MD  levofloxacin (LEVAQUIN) 500 MG tablet Take 1 tablet (  500 mg total) by mouth daily. 11/03/17   Steve Rattler, DO  lisinopril-hydrochlorothiazide (PRINZIDE,ZESTORETIC) 20-12.5 MG tablet TAKE 1 TABLET BY MOUTH EVERY DAY 02/11/18   Steve Rattler, DO  lisinopril-hydrochlorothiazide (ZESTORETIC) 20-12.5 MG tablet Take 1 tablet by mouth daily. Patient taking differently: Take 1 tablet by mouth at bedtime.  11/08/16   Steve Rattler, DO  metFORMIN (GLUCOPHAGE) 1000 MG tablet Take 1 tablet (1,000 mg total) by mouth daily with breakfast. 08/13/17   Lucila Maine C, DO  oxyCODONE (OXY IR/ROXICODONE) 5 MG immediate release tablet Take 1 tablet (5 mg total) by mouth every 6 (six) hours as needed for severe pain or breakthrough pain. 12/11/16   Bonnita Hollow, MD  polyethylene glycol [facility] Floria Raveling) packet Take 17 g by mouth daily as needed for mild constipation. 12/11/16   Bonnita Hollow, MD  predniSONE (DELTASONE) 1 MG tablet Take 1 tablet (1 mg total) by mouth daily with breakfast. 08/13/17   Steve Rattler, DO  predniSONE (DELTASONE) 5 MG tablet TAKE 2 TABLETS (10 MG TOTAL) BY MOUTH DAILY WITH BREAKFAST. 02/11/18   Steve Rattler, DO  triamcinolone (KENALOG) 0.025 % ointment Apply 1 application topically 2 (two) times daily. 01/23/17   Steve Rattler, DO    Family History History reviewed. No pertinent family history.  Social History Social History   Tobacco Use  . Smoking status: Current Every Day Smoker    Packs/day: 0.50    Years: 20.00    Pack years: 10.00    Types: Cigarettes  . Smokeless tobacco: Never Used  . Tobacco comment: working on quitting  Substance Use Topics  . Alcohol use: Yes    Alcohol/week: 0.0 standard drinks    Comment: 11/2016 " i DRINK LESS THAN i USED TO,BUT i DRINK BEER EVERYDAY  . Drug use: No     Allergies   Patient has no known allergies.   Review of Systems Review of Systems  Constitutional: Positive for fatigue. Negative for fever.  HENT: Negative for congestion.   Respiratory: Negative for chest tightness and shortness of breath.   Cardiovascular: Negative for chest pain, palpitations and leg swelling.  Gastrointestinal: Positive for abdominal pain, diarrhea and nausea. Negative for vomiting.  Genitourinary: Negative for dysuria and pelvic pain.  Musculoskeletal: Positive for back pain. Negative for neck pain.  Skin: Positive for rash (Chronic, unchanged, reports small areas of "pus" coming from her left lower leg. ).  Neurological: Negative for weakness, light-headedness and  headaches.  All other systems reviewed and are negative.    Physical Exam Updated Vital Signs BP 126/82   Pulse 89   Temp 98.7 F (37.1 C) (Oral)   Resp 12   Ht '5\' 9"'  (1.753 m)   Wt 59 kg   SpO2 95%   BMI 19.20 kg/m   Physical Exam  Constitutional: She appears well-developed. No distress.  Frail, moon facies, thin extremities.   HENT:  Head: Normocephalic and atraumatic.  Mouth/Throat: Oropharynx is clear and moist.  Eyes: Conjunctivae are normal. Right eye exhibits no discharge. Left eye exhibits no discharge. No scleral icterus.  Neck: Normal range of motion.  Cardiovascular: Normal rate, regular rhythm, normal heart sounds and intact distal pulses.  No murmur heard. Pulmonary/Chest: Effort normal and breath sounds normal. No stridor. No respiratory distress. She exhibits no tenderness.  Abdominal: Soft. Bowel sounds are normal. She exhibits no distension. There is tenderness. There is guarding.  NO CVA TTP.   Musculoskeletal: She  exhibits no edema or deformity.  There is mild TTP over right lower lumbar back, however this does not recreate her pain.  There is midline lower lumbar/sacral TTP.  No midline step offs/deformities.  There is right buttock TTP.  Poor muscle bulk/tone generally.  Able to lift bilateral legs off bed, 5-/5 strength.  Normal grip strength.   Neurological: She is alert. She exhibits normal muscle tone.  Sensation intact to bilateral upper and lower extremities.   Skin: Skin is warm and dry. She is not diaphoretic.  No wounds on bottoms of feet.  There is chronic dermatitis of the bilateral lower legs, primarily on the left lower leg. No purulent drainage, no abnormal fluctuance or induration.   Psychiatric: She has a normal mood and affect. Her behavior is normal.  Nursing note and vitals reviewed.    ED Treatments / Results  Labs (all labs ordered are listed, but only abnormal results are displayed) Labs Reviewed  COMPREHENSIVE METABOLIC PANEL  - Abnormal; Notable for the following components:      Result Value   Sodium 133 (*)    CO2 19 (*)    Glucose, Bld 165 (*)    Calcium 8.4 (*)    Total Protein 6.4 (*)    All other components within normal limits  CBC WITH DIFFERENTIAL/PLATELET - Abnormal; Notable for the following components:   RBC 3.73 (*)    Hemoglobin 11.4 (*)    All other components within normal limits  URINALYSIS, ROUTINE W REFLEX MICROSCOPIC - Abnormal; Notable for the following components:   Color, Urine COLORLESS (*)    Specific Gravity, Urine 1.002 (*)    All other components within normal limits  ETHANOL - Abnormal; Notable for the following components:   Alcohol, Ethyl (B) 263 (*)    All other components within normal limits  LIPASE, BLOOD  I-STAT BETA HCG BLOOD, ED ([facility], [facility], [facility] ONLY)    EKG None  Radiology Ct Abdomen Pelvis W Contrast  Result Date: 03/30/2018 CLINICAL DATA:  47 year old female with abdominal and back pain. EXAM: CT ABDOMEN AND PELVIS WITH CONTRAST TECHNIQUE: Multidetector CT imaging of the abdomen and pelvis was performed using the standard protocol following bolus administration of intravenous contrast. CONTRAST:  144m OMNIPAQUE IOHEXOL 300 MG/ML  SOLN COMPARISON:  CT dated 01/21/2017 FINDINGS: Lower chest: The visualized lung bases are clear. There is coronary vascular calcification. No intra-abdominal free air or free fluid. Hepatobiliary: No focal liver abnormality is seen. No gallstones, gallbladder wall thickening, or biliary dilatation. Pancreas: Coarse calcification of soft the pancreas sequela of chronic pancreatitis. No active inflammation. Spleen: Normal in size without focal abnormality. Adrenals/Urinary Tract: The adrenal glands are unremarkable. There is no hydronephrosis on either side. The visualized ureters appear unremarkable. Punctate calcific focus along the course distal right ureter stable since the prior CT most likely a phlebolith. The urinary bladder is unremarkable.  Stomach/Bowel: There is redundancy of the sigmoid colon. There is no bowel obstruction or active inflammation. Normal appendix. Vascular/Lymphatic: There is moderate aortoiliac atherosclerotic disease. No portal venous gas. There is no adenopathy. Reproductive: The uterus is anteverted. Small posterior body fibroid noted. The ovaries are grossly unremarkable. Other: Mild diffuse subcutaneous edema. Musculoskeletal: No acute or significant osseous findings. IMPRESSION: No acute intra-abdominal or pelvic pathology. No bowel obstruction or active inflammation. Normal appendix. Electronically Signed   By: AAnner CreteM.D.   On: 03/30/2018 02:34    Procedures Procedures (including critical care time)  Medications Ordered in ED Medications  iohexol (OMNIPAQUE) 300 MG/ML solution 100 mL (100 mLs Intravenous Contrast Given 03/30/18 0212)  sodium chloride 0.9 % bolus 1,000 mL (0 mLs Intravenous Stopped 03/30/18 0403)     Initial Impression / Assessment and Plan / ED Course  I have reviewed the triage vital signs and the nursing notes.  Pertinent labs & imaging results that were available during my care of the patient were reviewed by me and considered in my medical decision making (see chart for details).  Clinical Course as of Mar 30 617  Mon Mar 30, 2018  0145 Spoke with lab, they report that when they came in there was no lipase on the machine so they had to add reagent and re-calibrate.  Asked about CMP and they said that it will not auto release until lipase is done so will manually release it.    [EH]  [id number] Patient reevaluated, she is resting comfortably in no obvious distress.  Discussed results.  She has follow-up with [facility].  Discussed her alcohol elevation, and the importance of taking her medications as prescribed.   [EH]    Clinical Course User Index [EH] Lorin Glass, PA-C    Patient presents today for evaluation of acute bilateral lower back pain with  foot pain and abdominal pain.  Patient is immunosuppressed with chronic prednisone due to a rash.  Labs were obtained and reviewed, hemoglobin is slightly low at 11.4.  Her glucose is mildly elevated at 165, her alcohol is elevated at 263.  She does have a history of chronic pancreatitis, however her lipase is not elevated today.  Given abdominal pain, and back pain, concern for intra-abdominal pathology especially given that patient is immunosuppressed.  CT abdomen pelvis was ordered without evidence of acute abnormalities or cause for her symptoms.  She does not have any acute changes to bowel or bladder function, no saddle anesthesia or paresthesias, not consistent with cauda equina.    Suspect that patient's symptoms is caused by generalized muscle weakness, she exhibits poor muscle bulk, combined with her known diabetic neuropathy.  Discussed the importance of eating a healthy well-balanced diet, and taking her medications as directed.   Recommended PCP follow up.    Return precautions were discussed with patient who states their understanding.  At the time of discharge patient denied any unaddressed complaints or concerns.  Patient is agreeable for discharge home.  Final Clinical Impressions(s) / ED Diagnoses   Final diagnoses:  Acute bilateral low back pain, unspecified whether sciatica present  Foot pain, bilateral  Neuropathy involving both lower extremities  Alcohol use    ED Discharge Orders    None       Lorin Glass, PA-C 03/30/18 7482    Ward, Delice Bison, DO 03/30/18 [phone]

## 2018-03-29 NOTE — ED Triage Notes (Signed)
Pt c/o lower back pain, RLE pain since yesterday. Hx neuropathy in LEs.

## 2018-03-30 ENCOUNTER — Emergency Department (HOSPITAL_COMMUNITY): Payer: Medicaid Other

## 2018-03-30 DIAGNOSIS — Z7289 Other problems related to lifestyle: Secondary | ICD-10-CM

## 2018-03-30 DIAGNOSIS — F1090 Alcohol use, unspecified, uncomplicated: Secondary | ICD-10-CM

## 2018-03-30 DIAGNOSIS — Z789 Other specified health status: Secondary | ICD-10-CM

## 2018-03-30 LAB — CBC WITH DIFFERENTIAL/PLATELET
Abs Immature Granulocytes: 0.05 10*3/uL (ref 0.00–0.07)
BASOS ABS: 0 10*3/uL (ref 0.0–0.1)
BASOS PCT: 1 %
EOS ABS: 0 10*3/uL (ref 0.0–0.5)
EOS PCT: 1 %
HCT: 36.3 % (ref 36.0–46.0)
Hemoglobin: 11.4 g/dL — ABNORMAL LOW (ref 12.0–15.0)
Immature Granulocytes: 1 %
Lymphocytes Relative: 19 %
Lymphs Abs: 1.4 10*3/uL (ref 0.7–4.0)
MCH: 30.6 pg (ref 26.0–34.0)
MCHC: 31.4 g/dL (ref 30.0–36.0)
MCV: 97.3 fL (ref 80.0–100.0)
Monocytes Absolute: 0.2 10*3/uL (ref 0.1–1.0)
Monocytes Relative: 3 %
NRBC: 0 % (ref 0.0–0.2)
Neutro Abs: 5.5 10*3/uL (ref 1.7–7.7)
Neutrophils Relative %: 75 %
PLATELETS: 196 10*3/uL (ref 150–400)
RBC: 3.73 MIL/uL — AB (ref 3.87–5.11)
RDW: 15 % (ref 11.5–15.5)
WBC: 7.2 10*3/uL (ref 4.0–10.5)

## 2018-03-30 LAB — URINALYSIS, ROUTINE W REFLEX MICROSCOPIC
BILIRUBIN URINE: NEGATIVE
Glucose, UA: NEGATIVE mg/dL
Hgb urine dipstick: NEGATIVE
KETONES UR: NEGATIVE mg/dL
Leukocytes, UA: NEGATIVE
NITRITE: NEGATIVE
PROTEIN: NEGATIVE mg/dL
Specific Gravity, Urine: 1.002 — ABNORMAL LOW (ref 1.005–1.030)
pH: 5 (ref 5.0–8.0)

## 2018-03-30 LAB — COMPREHENSIVE METABOLIC PANEL
ALK PHOS: 82 U/L (ref 38–126)
ALT: 16 U/L (ref 0–44)
ANION GAP: 11 (ref 5–15)
AST: 30 U/L (ref 15–41)
Albumin: 3.5 g/dL (ref 3.5–5.0)
BILIRUBIN TOTAL: 0.4 mg/dL (ref 0.3–1.2)
BUN: 9 mg/dL (ref 6–20)
CALCIUM: 8.4 mg/dL — AB (ref 8.9–10.3)
CO2: 19 mmol/L — ABNORMAL LOW (ref 22–32)
Chloride: 103 mmol/L (ref 98–111)
Creatinine, Ser: 0.77 mg/dL (ref 0.44–1.00)
GFR calc non Af Amer: >60 mL/min (ref >60)
Glucose, Bld: 165 mg/dL — ABNORMAL HIGH (ref 70–99)
Potassium: 3.8 mmol/L (ref 3.5–5.1)
SODIUM: 133 mmol/L — AB (ref 135–145)
TOTAL PROTEIN: 6.4 g/dL — AB (ref 6.5–8.1)

## 2018-03-30 LAB — ETHANOL: Alcohol, Ethyl (B): 263 mg/dL — ABNORMAL HIGH (ref <10)

## 2018-03-30 LAB — LIPASE, BLOOD: Lipase: 16 U/L (ref 11–51)

## 2018-03-30 MED ORDER — IOHEXOL 300 MG/ML  SOLN
100.0000 mL | Freq: Once | INTRAMUSCULAR | Status: AC | PRN
  Administered 2018-03-30: 100 mL via INTRAVENOUS

## 2018-03-30 MED ORDER — SODIUM CHLORIDE 0.9 % IV BOLUS
1000.0000 mL | Freq: Once | INTRAVENOUS | Status: AC
  Administered 2018-03-30: 1000 mL via INTRAVENOUS

## 2018-03-30 NOTE — Discharge Instructions (Addendum)
Today you received contrast through your IV with your CAT scan.  Please do not take your metformin for the next 3 days.  Please make sure that you are drinking extra fluids.    Please take Ibuprofen (Advil, motrin) to relieve your pain.  You may take up to 600 MG (3 pills) of normal strength ibuprofen every 8 hours as needed.   Do not take other NSAID'S while taking ibuprofen (such as aleve or naproxen).  Please take ibuprofen with food to decrease stomach upset.

## 2018-03-30 NOTE — ED Notes (Signed)
Reviewed d/c instructions with pt, who verbalized understanding and had no outstanding questions. Pt departed in NAD.   

## 2018-03-30 NOTE — ED Notes (Signed)
Patient transported to CT 

## 2018-03-31 ENCOUNTER — Ambulatory Visit (INDEPENDENT_AMBULATORY_CARE_PROVIDER_SITE_OTHER): Payer: Self-pay | Admitting: Family Medicine

## 2018-03-31 VITALS — BP 125/80 | HR 89 | Temp 98.5°F | Wt 139.2 lb

## 2018-03-31 DIAGNOSIS — M79671 Pain in right foot: Secondary | ICD-10-CM

## 2018-03-31 DIAGNOSIS — E1140 Type 2 diabetes mellitus with diabetic neuropathy, unspecified: Secondary | ICD-10-CM

## 2018-03-31 LAB — POCT GLYCOSYLATED HEMOGLOBIN (HGB A1C): HbA1c, POC (controlled diabetic range): 9.3 % — AB (ref 0.0–7.0)

## 2018-03-31 NOTE — Progress Notes (Addendum)
Subjective:     Christine [name], is a 47 y.o. female   History provider by patient No interpreter necessary.  Chief Complaint  Patient presents with  . Foot Pain    right   HPI:  Christine [name] is a 47 yo F w/Hx of T2DM, diabetic neuropathy, HTN, chronic tobacco use, and chronic prednisone use who presents with 4-day hx of R foot pain. She reports feeling back pain starting 11/30 morning which radiated down her R leg to her foot through 12/1. She visited the ED on 12/1 where she was diagnosed with for acute BL low back pain with BL foot pain. She states she did not receive any medication there and has used [employer] to manage pain.  Today, she is only feeling pain in her R foot. It is on the lateral side as well as centrally around her 3rd metatarsal. She states her use of [employer] Powder has helped reduce pain 8/10 to a dull, achey 6/10. She has tried increasing her Gabapentin, but this has not helped with symptoms. Her pain worsens with walking and standing, which has led to her staying home from work for 2 days. Her pain is maintained at rest. She reports no fever, weakness, bowel/stool incontinence.  Her last A1c was checked in April 2019 at 7.8. She reports she has had diabetic neuropathy in the past, but feels this pain is different. Previously she felt tingles and numbness in her feet, but reports she is feeling more dull/achey pain than numbness.   Review of Systems  Constitutional: Negative for chills, fatigue and fever.  Gastrointestinal: Negative for abdominal pain.  Neurological: Negative for weakness and numbness.    Patient's history was reviewed and updated as appropriate: allergies, current medications, past medical history, past social history and problem list.     Objective:    BP 125/80 (BP Location: Right Arm, Patient Position: Sitting, Cuff Size: Normal)   Pulse 89   Temp 98.5 F (36.9 C) (Oral)   Wt 139 lb 3.2 oz (63.1 kg)   SpO2 98%   BMI 20.56 kg/m    Physical Exam  Cardiovascular: Normal rate, regular rhythm and normal heart sounds.  No murmur heard. Pulmonary/Chest: Effort normal and breath sounds normal. She has no wheezes.  Abdominal: Bowel sounds are normal.  Musculoskeletal: She exhibits tenderness. She exhibits no edema.  Neuro: equal sense of touch BL, Normal ROM in feet BL  12/3 Today's Hgb A1c: 9.3    Assessment & Plan:   Christine [name] is a 47yo F w/Hx of T2DM, diabetic neuropathy, chronic prednisone use, chronic tobacco use who presents with 6/10 dull R foot pain. Differential includes Morton's Neuroma, Diabetic neuropathy, osteoarthritis, hairline fracture. Given hx of T2DM w/neuropathy and increase in Hgb A1c from 7.8 to 9.3, worsening diabetic neuropathy could be likely. However, current pain description along with hx of tobacco, prednisone use and hx of T2DM increases likelihood of fracture.   R Foot Pain -Schedule X-ray of R Foot to r/o fracture -Referral to Podiatry  -Use Ibuprofen 800mg  R7E for pain control in place of Goody Powder  Supportive care and return precautions reviewed.  Return if symptoms worsen or fail to improve.  Metzger Bing, DO   I have personally seen and examined this patient with Christine [name] and agree with the above note. The following is my additional documentation.   Physical Exam: General: well nourished, well developed, NAD with non-toxic appearance Cardiovascular: regular rate and rhythm without murmurs, rubs, or  gallops Lungs: clear to auscultation bilaterally with normal work of breathing Skin: warm, dry, no rashes or lesions, cap refill < 2 seconds, no signs of skin breakdown or cyanosis or rubor Extremities: warm and well perfused, normal tone, no edema, tenderness localized to plantar surface of fourth metatarsal, 2+ dorsal pedal pulse, 5/5 motor strength on lower extremities  Assessment/Plan Christine [name] is a 47 y.o. poorly controlled diabetic with known peripheral neuropathy  presenting with acute worsening of right foot pain.  I do agree that this is not consistent with her typical peripheral neuropathy.  There are no signs of cellulitis or ischemia.  Less likely gout.  Given known peripheral neuropathy, will obtain plain imaging of right foot to rule out fracture or avulsion.  May likely represent arthritis versus tendinopathy.  Pain seems to be localized more to plantar surface of fourth metatarsal making Morton's neuroma or plantar fasciitis less likely.  Ambulatory referral placed for podiatry.  Maintain gabapentin at chronic level.  Advised to discontinue BC powder and instructed to take ibuprofen as needed.  Harriet Butte, [place], PGY-3

## 2018-03-31 NOTE — Progress Notes (Deleted)
   Subjective   Patient ID: Christine [name]    DOB: 1970/08/11, 47 y.o. female   MRN: [id number]  CC: "***"  HPI: Christine [name] is a 47 y.o. female who presents to clinic today for the following:  Bilateral foot pain: Patient is an uncontrolled diabetic (last A1c 7.8 in April) with history of peripheral neuropathy, chronic pancreatitis, chronic prednisone use, and history of substance use disorder and smoking ***  ROS: see HPI for pertinent.  Avis: Reviewed. Smoking status reviewed. Medications reviewed.  Objective   There were no vitals taken for this visit. Vitals and nursing note reviewed.  General: well nourished, well developed, NAD with non-toxic appearance HEENT: normocephalic, atraumatic, moist mucous membranes Neck: supple, non-tender without lymphadenopathy Cardiovascular: regular rate and rhythm without murmurs, rubs, or gallops Lungs: clear to auscultation bilaterally with normal work of breathing Abdomen: soft, non-tender, non-distended, normoactive bowel sounds Skin: warm, dry, no rashes or lesions, cap refill < 2 seconds Extremities: warm and well perfused, normal tone, no edema  Assessment & Plan   No problem-specific Assessment & Plan notes found for this encounter.  No orders of the defined types were placed in this encounter.  No orders of the defined types were placed in this encounter.   Harriet Butte, [place], PGY-3 03/31/2018, 12:13 PM

## 2018-03-31 NOTE — Patient Instructions (Signed)
Thank you for coming in to see [place] today. Please see below to review our plan for today's visit.  1.  Please go get your x-ray tomorrow.  You should be able to walk and and ask for the radiology department here at [facility].  Call you with the results if there is anything abnormal within 24 hours. 2.  I have placed a referral for you to see the foot doctor.  They will contact you within the week to get a set up. 3.  Please discontinue the BC powder.  You can use ibuprofen 800 mg every 8 hours as needed for pain.  Please call the clinic at [phone] if your symptoms worsen or you have any concerns. It was our pleasure to serve you.  Harriet Butte, [place], PGY-3

## 2018-04-17 ENCOUNTER — Other Ambulatory Visit: Payer: Self-pay | Admitting: Family Medicine

## 2018-05-08 ENCOUNTER — Ambulatory Visit: Payer: Medicaid Other | Admitting: Family Medicine

## 2018-05-14 ENCOUNTER — Ambulatory Visit (INDEPENDENT_AMBULATORY_CARE_PROVIDER_SITE_OTHER): Payer: Self-pay | Admitting: Family Medicine

## 2018-05-14 ENCOUNTER — Ambulatory Visit (HOSPITAL_COMMUNITY)
Admission: RE | Admit: 2018-05-14 | Discharge: 2018-05-14 | Disposition: A | Discharge status: Home / Self Care | Payer: Medicaid Other | Source: Ambulatory Visit | Attending: Family Medicine | Admitting: Family Medicine

## 2018-05-14 ENCOUNTER — Other Ambulatory Visit: Payer: Self-pay

## 2018-05-14 VITALS — BP 150/80 | HR 88 | Temp 98.4°F | Wt 138.0 lb

## 2018-05-14 DIAGNOSIS — R55 Syncope and collapse: Secondary | ICD-10-CM | POA: Insufficient documentation

## 2018-05-14 DIAGNOSIS — R21 Rash and other nonspecific skin eruption: Secondary | ICD-10-CM

## 2018-05-14 DIAGNOSIS — R5383 Other fatigue: Secondary | ICD-10-CM

## 2018-05-14 DIAGNOSIS — R42 Dizziness and giddiness: Secondary | ICD-10-CM | POA: Insufficient documentation

## 2018-05-14 NOTE — Progress Notes (Signed)
Subjective:   Patient ID: [name]    DOB: 1971-04-07, 48 y.o. female   MRN: [id number]  Christine [name] is a 48 y.o. female with a history of HTN, chronic alcoholic pancreatitis, DM, tobaco use, HLD, anemia, Vit D deficiency, protein-calorie malnutrition here for   Dizziness Patient presents for episode of syncope at work this morning around 8:20 AM.  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  She denies any other prodromal symptoms such as chest pain, shortness of breath, vision changes, nausea.  She denies any confusion after the event, loss of bowel or bladder control, biting of her tongue.  This type of episode also happened 3-4 months ago when she was standing at work but never received any formal evaluation afterwards.  She states she sometimes has back and side pain while standing that is chronic for her, she takes ibuprofen for this as needed.  She denies any palpitations, new medications, hearing loss, ear pain, other recent falls, head trauma, unilateral weakness in extremities, headache, difficulty speaking or thinking.  She lives at home with her mother who has not noticed any abnormal behaviors.  She also reports ongoing fatigue for the last 2-3 months.  She is on chronic prednisone for a diffuse pruritic and hyperpigmented rash, currently on 10 mg with exacerbation in symptoms with tapering.  She is not on blood thinners.  She states occasionally she feels like her legs will buckle underneath her but has never fallen due to this.  Rash Patient endorses diffuse pruritic, hyperpigmented rash present over legs and arms that have been present for years.  Currently on prednisone taper, however reports she has had 2 increase prednisone dose to keep rash under control.  Previously referred to dermatology but has not heard about an appointment yet.  Review of Systems:  Per HPI.  Tracy, medications and smoking  status reviewed.  Objective:   BP (!) 150/80   Pulse 88   Temp 98.4 F (36.9 C) (Oral)   Wt 138 lb (62.6 kg)   LMP 03/14/2018   SpO2 99%   BMI 20.38 kg/m  Vitals and nursing note reviewed.  General: well nourished, well developed, in no acute distress with non-toxic appearance HEENT: normocephalic, atraumatic, moist mucous membranes. PERRL, EOMI Neck: supple, non-tender without lymphadenopathy CV: regular rate and rhythm without murmurs, rubs, or gallops, no lower extremity edema Lungs: clear to auscultation bilaterally with normal work of breathing Skin: warm, dry.  Diffuse hyperpigmented circular patches along lower extremities and arms. Extremities: warm and well perfused, normal tone MSK: ROM grossly intact, strength 5/5 to UE bilaterally, 3/5 strength to LE bilaterally, deep reflexes intact, normal gait.  No edema.  Inability to perform deep knee bend due to knee pain. Neuro: Alert and oriented, speech normal.  Romberg negative.  Optic field normal. PERRL, Extraocular movements intact.  Intact symmetric sensation to light touch of face and extremities bilaterally.  Hearing grossly intact bilaterally.  Tongue protrudes normally with no deviation.  Shoulder shrug, smile symmetric. Finger to nose normal.  Assessment & Plan:   Syncope Most likely vasovagal given history of standing for prolonged period of time at work.  Orthostatics obtained in clinic today were negative.  EKG NSR without abnormalities noted.  No family history of sudden cardiac death or arrhythmias and no symptoms to suggest cardiac etiology.  No seizure activity suspected given lack of loss of bowel or bladder control  and lack of postictal state.  Labs recently obtained 03/30/2018, notable for slight hyponatremia, hyperglycemia to 165, Hb 11 (bl ~10-11).  Significant quadricep weakness on exam which may have contributed to event although would not explain loss of consciousness.  Obtained repeat BMP, TSH, CK.  Advised  quadricep strengthening exercises, adequate oral hydration and avoiding prolonged standing at work.  Rash and nonspecific skin eruption Currently undergoing prednisone taper, however has halted due to decreased effectiveness with lower doses.  Given fatigue, proximal muscle weakness and improvement with steroids, question autoimmune component.  Will check CK, ESR and will replace Derm referral.  Orders Placed This Encounter  Procedures  . Basic Metabolic Panel  . TSH  . CK  . Sedimentation Rate  . Ambulatory referral to Dermatology    Referral Priority:   Routine    Referral Type:   Consultation    Referral Reason:   Specialty Services Required    Requested Specialty:   Dermatology    Number of Visits Requested:   1  . EKG 12-Lead    Ordered by an unspecified provider    No orders of the defined types were placed in this encounter.   Rory Percy, DO PGY-2, [facility] 05/14/2018 8:45 PM

## 2018-05-14 NOTE — Patient Instructions (Addendum)
It was great to see you!  Our plans for today:  - Your EKG and orthostatics looked fine.  - I think you have some weakness in your quadricept muscles which may be contributing. Work on strengthening these using exercises below. - We are checking some labs today, we will call you or send you a letter if they are abnormal.  - Follow up with Dr. Vanetta Shawl in a few weeks.  Take care and seek immediate care sooner if you develop any concerns.   Dr. [facility]

## 2018-05-14 NOTE — Assessment & Plan Note (Signed)
Most likely vasovagal given history of standing for prolonged period of time at work.  Orthostatics obtained in clinic today were negative.  EKG NSR without abnormalities noted.  No family history of sudden cardiac death or arrhythmias and no symptoms to suggest cardiac etiology.  No seizure activity suspected given lack of loss of bowel or bladder control and lack of postictal state.  Labs recently obtained 03/30/2018, notable for slight hyponatremia, hyperglycemia to 165, Hb 11 (bl ~10-11).  Significant quadricep weakness on exam which may have contributed to event although would not explain loss of consciousness.  Obtained repeat BMP, TSH, CK.  Advised quadricep strengthening exercises, adequate oral hydration and avoiding prolonged standing at work.

## 2018-05-14 NOTE — Assessment & Plan Note (Addendum)
Currently undergoing prednisone taper, however has halted due to decreased effectiveness with lower doses.  Given fatigue, proximal muscle weakness and improvement with steroids, question autoimmune component.  Will check CK, ESR and will replace Derm referral.

## 2018-05-15 ENCOUNTER — Telehealth: Payer: Self-pay | Admitting: *Deleted

## 2018-05-15 LAB — BASIC METABOLIC PANEL
BUN / CREAT RATIO: 8 — AB (ref 9–23)
BUN: 7 mg/dL (ref 6–24)
CALCIUM: 9.2 mg/dL (ref 8.7–10.2)
CHLORIDE: 104 mmol/L (ref 96–106)
CO2: 20 mmol/L (ref 20–29)
Creatinine, Ser: 0.88 mg/dL (ref 0.57–1.00)
GFR, EST AFRICAN AMERICAN: 90 mL/min/{1.73_m2} (ref >59)
GFR, EST NON AFRICAN AMERICAN: 78 mL/min/{1.73_m2} (ref >59)
Glucose: 144 mg/dL — ABNORMAL HIGH (ref 65–99)
Potassium: 4.6 mmol/L (ref 3.5–5.2)
Sodium: 139 mmol/L (ref 134–144)

## 2018-05-15 LAB — SEDIMENTATION RATE: SED RATE: 6 mm/h (ref 0–32)

## 2018-05-15 LAB — CK: Total CK: 60 U/L (ref 24–173)

## 2018-05-15 LAB — TSH: TSH: 0.803 u[IU]/mL (ref 0.450–4.500)

## 2018-05-15 NOTE — Telephone Encounter (Signed)
LM for patient to call back. Jazmin Hartsell,CMA  

## 2018-05-15 NOTE — Telephone Encounter (Signed)
-----   Message from Rory Percy, DO sent at 05/15/2018  1:52 PM EST ----- Please let patient know all of her labs are normal. She should follow up with Dr. Vanetta Shawl for her diabetes and ongoing weight loss - no clear lab evidence for her syncope or weight loss.

## 2018-07-03 ENCOUNTER — Other Ambulatory Visit: Payer: Self-pay | Admitting: Family Medicine

## 2018-07-28 ENCOUNTER — Telehealth: Payer: Self-pay | Admitting: Family Medicine

## 2018-07-28 ENCOUNTER — Telehealth (INDEPENDENT_AMBULATORY_CARE_PROVIDER_SITE_OTHER): Payer: Self-pay | Admitting: Family Medicine

## 2018-07-28 ENCOUNTER — Encounter: Payer: Self-pay | Admitting: Family Medicine

## 2018-07-28 ENCOUNTER — Other Ambulatory Visit: Payer: Self-pay

## 2018-07-28 DIAGNOSIS — R053 Chronic cough: Secondary | ICD-10-CM | POA: Insufficient documentation

## 2018-07-28 DIAGNOSIS — R05 Cough: Secondary | ICD-10-CM

## 2018-07-28 DIAGNOSIS — R059 Cough, unspecified: Secondary | ICD-10-CM

## 2018-07-28 NOTE — Telephone Encounter (Signed)
Patient scheduled for telemedicine visit today at 3:30pm for coughing, and patient also requesting note for work. Please give patient a call back.

## 2018-07-28 NOTE — Telephone Encounter (Signed)
Page spoke with patient and changed the note for her to go back to work Thursday.  Thanks.

## 2018-07-28 NOTE — Assessment & Plan Note (Addendum)
Cough likely secondary to irritant from bleach.  Patient very low risk for COVID-19 given my evaluation.  Would not keep patient out of work for this.  Patient was given a note for work that was left at the front desk for her to pick up. -Red flag symptoms and return precautions discussed with the patient, she voiced understanding

## 2018-07-28 NOTE — Telephone Encounter (Signed)
Patient would like you to call because she has a question about her letter you made for her.  Can she be out one day longer? .  She picked it up today.  [phone].

## 2018-07-28 NOTE — Progress Notes (Signed)
[facility] Tele[facility]icine Visit  Patient consented to have visit conducted via telephone.  Encounter participants: Patient: Christine [name]  Provider: Bernita Raisin Daliana Leverett  Others (if applicable): Dr. Andria Frames  Chief Complaint: Cough  HPI: Patient calling into office for concern of cough.  She states that she has had a cough for the last few days, that has been exacerbated by using bleach at her work.  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  She states that because she was coughing today at work, she was told to go home and could not return until she was cleared by her physician.  She has not contacted her human resources department at work about this.  She states that prior to changing to bleach, she had not been having any symptoms.  She has not had any shortness of breath, fevers, known COVID-19 contacts, travel in the last 14 days out of the state.  She states that she believes the cough is related to changing their cleaning products from what they are using before to bleach.  She states that overall she feels very well.  She is requesting a note for work so that she may return back to work.  ROS: Per HPI  Pertinent PMHx: Hypertension, diabetes, hyperlipidemia, tobacco use, history of drug use, history of alcohol use  Exam:  Respiratory: Patient speaking on the phone in complete sentences, does not appear to be in any respiratory distress over the phone  Assessment/Plan:  Cough Cough likely secondary to irritant from bleach.  Patient very low risk for COVID-19 given my evaluation.  Would not keep patient out of work for this.  Patient was given a note for work that was left at the front desk for her to pick up. -Red flag symptoms and return precautions discussed with the patient, she voiced understanding    Time spent on phone with patient: 9 minutes

## 2018-07-29 NOTE — Telephone Encounter (Signed)
Patients needs were addressed during telehealth visit w/ Dr. Sandi Carne

## 2018-08-02 ENCOUNTER — Other Ambulatory Visit: Payer: Self-pay | Admitting: Family Medicine

## 2018-08-20 ENCOUNTER — Other Ambulatory Visit: Payer: Self-pay | Admitting: Family Medicine

## 2018-08-24 MED ORDER — GABAPENTIN 300 MG PO CAPS: ORAL_CAPSULE | ORAL | 1 refills | Status: DC

## 2018-08-24 NOTE — Addendum Note (Signed)
Addended by: Christen Bame D on: 08/24/2018 10:34 AM   Modules accepted: Orders

## 2018-08-24 NOTE — Telephone Encounter (Signed)
Transmission failed. Resent. Jessica Fleeger, CMA  

## 2018-10-08 ENCOUNTER — Other Ambulatory Visit: Payer: Self-pay | Admitting: Family Medicine

## 2018-10-21 ENCOUNTER — Other Ambulatory Visit: Payer: Self-pay | Admitting: Family Medicine

## 2018-11-18 ENCOUNTER — Other Ambulatory Visit: Payer: Self-pay

## 2018-11-18 MED ORDER — GABAPENTIN 300 MG PO CAPS: ORAL_CAPSULE | ORAL | 1 refills | Status: DC

## 2018-12-14 ENCOUNTER — Telehealth (INDEPENDENT_AMBULATORY_CARE_PROVIDER_SITE_OTHER): Payer: Self-pay | Admitting: Family Medicine

## 2018-12-14 ENCOUNTER — Other Ambulatory Visit: Payer: Self-pay

## 2018-12-14 DIAGNOSIS — R5383 Other fatigue: Secondary | ICD-10-CM

## 2018-12-14 DIAGNOSIS — R05 Cough: Secondary | ICD-10-CM

## 2018-12-14 DIAGNOSIS — R159 Full incontinence of feces: Secondary | ICD-10-CM

## 2018-12-14 DIAGNOSIS — R053 Chronic cough: Secondary | ICD-10-CM

## 2018-12-14 NOTE — Assessment & Plan Note (Signed)
Patient complaining of fatigue at rest and on exertion with heavy feeling in her legs.  No shortness of breath.  Patient has had varying degrees of normocytic anemia on previous CBCs. -Scheduled in person appointment in 2 days. -CBC with iron studies - Consider switching patient from lisinopril to losartan

## 2018-12-14 NOTE — Assessment & Plan Note (Signed)
Patient complaining of episodes of fecal incontinence 2-3 times a week for the past month.  Has bowel movements at least 3 times a day other than that in which she is continent. - Will need to schedule MRI spine for nerve compression -We will see patient in 2 days in the office for physical exam and lab work. -Advised patient to come in to the ED earlier if incontinence worsens

## 2018-12-14 NOTE — Progress Notes (Signed)
[facility] Tele[facility]icine Visit  Patient consented to have virtual visit. Method of visit: Telephone  Encounter participants: Patient: Christine [name] - located at home Provider: Benay Pike - located at [facility] Others (if applicable): None  Chief Complaint: Fecal incontinence, fatigue  HPI: Fatigue: Patient states she is feeling like she has been "dragging" at work and at home for the last week.  She says her legs feel heavy.  She says sometimes when she stands up she feels like she is going to faint.  She denies any shortness of breath.  She denies any nosebleeds, heavy periods, hematuria  Fecal incontinence: Patient states for the past month she has been having episodes of fecal incontinence happening approximately twice a week.  She states that people have had to inform her that she has a stain on her pants at work.  She has not noticed any blood in this, but has noticed bright red blood in her normal bowel movements which have been 3-4 times a day.  She is not having any urinary incontinence.  She has not had any saddle anesthesia but has had numbness going down her right leg that occurs a few times a day for last 10 to 15 minutes.  She has no back pain.  She does have paresthesia running down the right leg when it is not numb.  She states this is been going on for 4 to 5 months.  She takes gabapentin for this twice a day, which she states last for about a "hour or 2".  Cough: Patient has had chronic cough for a few months.  She does not believe it is associated with work.  She has not had any fever.  She gets her temperature checked every day at work and it has been 98 or below.  She has smoked a pack per day for 20 years.    ROS: per HPI  Pertinent PMHx:  Past Medical History:  Diagnosis Date  . Alcohol abuse   . Cellulitis 11/2016   RIGHT ARM  . Chronic pancreatitis ([place])   . Diabetes mellitus   . Hypertension   . Substance abuse ([place])      Exam:   Respiratory: Speaking in full sentences, no cough.  Assessment/Plan:  Fecal incontinence Patient complaining of episodes of fecal incontinence 2-3 times a week for the past month.  Has bowel movements at least 3 times a day other than that in which she is continent. - Will need to schedule MRI spine for nerve compression -We will see patient in 2 days in the office for physical exam and lab work. -Advised patient to come in to the ED earlier if incontinence worsens  Fatigue Patient complaining of fatigue at rest and on exertion with heavy feeling in her legs.  No shortness of breath.  Patient has had varying degrees of normocytic anemia on previous CBCs. -Scheduled in person appointment in 2 days. -CBC with iron studies - Consider switching patient from lisinopril to losartan  Chronic cough Several months of chronic cough with no signs of infection.  Previously attributed to chemicals causing irritation at her work.  Patient does not believe it is related to any chemical exposure at work.  Will need to consider COPD given her 20-year smoking history - Follow-up appointment in 2 days -Consider allergist referral    Time spent during visit with patient: 15 minutes

## 2018-12-14 NOTE — Assessment & Plan Note (Signed)
Several months of chronic cough with no signs of infection.  Previously attributed to chemicals causing irritation at her work.  Patient does not believe it is related to any chemical exposure at work.  Will need to consider COPD given her 20-year smoking history - Follow-up appointment in 2 days -Consider allergist referral

## 2018-12-16 ENCOUNTER — Other Ambulatory Visit: Payer: Self-pay

## 2018-12-16 ENCOUNTER — Encounter: Payer: Self-pay | Admitting: Family Medicine

## 2018-12-16 ENCOUNTER — Ambulatory Visit (INDEPENDENT_AMBULATORY_CARE_PROVIDER_SITE_OTHER): Payer: Self-pay | Admitting: Family Medicine

## 2018-12-16 VITALS — BP 112/56 | HR 102 | Wt 135.0 lb

## 2018-12-16 DIAGNOSIS — Z794 Long term (current) use of insulin: Secondary | ICD-10-CM

## 2018-12-16 DIAGNOSIS — R159 Full incontinence of feces: Secondary | ICD-10-CM

## 2018-12-16 DIAGNOSIS — R05 Cough: Secondary | ICD-10-CM

## 2018-12-16 DIAGNOSIS — R32 Unspecified urinary incontinence: Secondary | ICD-10-CM

## 2018-12-16 DIAGNOSIS — E1140 Type 2 diabetes mellitus with diabetic neuropathy, unspecified: Secondary | ICD-10-CM

## 2018-12-16 DIAGNOSIS — R059 Cough, unspecified: Secondary | ICD-10-CM

## 2018-12-16 DIAGNOSIS — R5383 Other fatigue: Secondary | ICD-10-CM

## 2018-12-16 DIAGNOSIS — R053 Chronic cough: Secondary | ICD-10-CM

## 2018-12-16 LAB — POCT GLYCOSYLATED HEMOGLOBIN (HGB A1C): HbA1c, POC (controlled diabetic range): 7.8 % — AB (ref 0.0–7.0)

## 2018-12-16 LAB — POCT URINALYSIS DIP (MANUAL ENTRY)
Bilirubin, UA: NEGATIVE
Blood, UA: NEGATIVE
Glucose, UA: NEGATIVE mg/dL
Ketones, POC UA: NEGATIVE mg/dL
Leukocytes, UA: NEGATIVE
Nitrite, UA: NEGATIVE
Protein Ur, POC: NEGATIVE mg/dL
Spec Grav, UA: 1.01 (ref 1.010–1.025)
Urobilinogen, UA: 0.2 E.U./dL
pH, UA: 5.5 (ref 5.0–8.0)

## 2018-12-16 LAB — HEMOCCULT GUIAC POC 1CARD (OFFICE): Fecal Occult Blood, POC: NEGATIVE

## 2018-12-16 MED ORDER — LOSARTAN POTASSIUM-HCTZ 50-12.5 MG PO TABS: 1.0000 | ORAL_TABLET | Freq: Every day | ORAL | 1 refills | Status: DC

## 2018-12-16 NOTE — Progress Notes (Signed)
[facility] Phone: [phone]     Christine [name] - 48 y.o. female MRN [id number]  Date of birth: Dec 03, 1970  Subjective:   cc: Fatigue cough, fecal incontinence  HPI:  Fatigue: Patient states she is tired all the time.  She does not have any dyspnea on exertion.  She does not have any orthopnea.  She does have orthostatic symptoms of getting dizzy when she stands up.  Cough: Patient has had a chronic cough for several months that is productive of clear and sometimes green sputum.  This last throughout the day.  Fecal incontinence: Patient has been having 1 month of intermittent fecal incontinence with episodes of normal bowel movements in between.  These have been at least 2-3 times a week.  Most recent episode was Monday after she got off the phone with [place].  Again, no blood in the bowel movement.  She has had a weight loss of 7 to 10 pounds in the last 6 to 7 months.  This was unintentional.  ROS: See HPI for pertinent positives and negatives  Past Medical History  Family history reviewed for today's visit. No changes.  Social history- patient is a current smoker  Health Maintenance:  -  Health Maintenance Due  Topic Date Due  . OPHTHALMOLOGY EXAM  11/03/2014  . PAP SMEAR-Modifier  12/29/2017  . FOOT EXAM  08/14/2018  . INFLUENZA VACCINE  11/28/2018    Objective:   BP (!) 112/56   Pulse (!) 102   Wt 135 lb (61.2 kg)   LMP 11/30/2018 (Approximate)   SpO2 99%   BMI 19.94 kg/m  Gen: NAD, alert and oriented, cooperative with exam CV: normal rate, regular rhythm. No murmurs, no rubs.  Resp: LCTAB, no wheezes, crackles. normal work of breathing GI: nontender to palpation, BS present, no guarding or organomegaly.  Rectal exam was notable for decreased tone of the anal sphincter. Psych: Appropriate behavior  Assessment/Plan:   Fecal incontinence I am concerned that the patient has nerve damage causing her fecal incontinence and decreased rectal  tone.  Will get MRI lumbar region for further evaluation.  If this is negative may refer to GI.  Not having saddle anesthesia or urinary incontinence. -MRI lumbar spine - FOBT - CMP -  Fatigue Patient has a history of varying levels of normocytic anemia.  Could be possible cause of her fatigue.  Unlikely that it is cardiac given her lack of dyspnea on exertion, orthopnea, crackles or edema on exam. - CBC, ferritin  Chronic cough Chronic and productive of clear to yellowish sputum.  Differential includes chronic bronchitis/COPD, lisinopril side effect, allergies, heart failure. - Switch lisinopril-hydrochlorothiazide to losartan-hydrochlorothiazide. -Chest x-ray - Consider allergist referral if other cause not identified.   Orders Placed This Encounter  Procedures  . MR Lumbar Spine Wo Contrast    Standing Status:   Future    Standing Expiration Date:   02/15/2020    Order Specific Question:   What is the patient's sedation requirement?    Answer:   No Sedation    Order Specific Question:   Does the patient have a pacemaker or implanted devices?    Answer:   No    Order Specific Question:   Preferred imaging location?    Answer:   [facility] (table limit-500 lbs)    Order Specific Question:   Radiology Contrast Protocol - do NOT remove file path    Answer:   \\[email or web address]\Radiant\[email or web address].PDF  . DG Chest  2 View    Standing Status:   Future    Standing Expiration Date:   02/15/2020    Order Specific Question:   Reason for Exam (SYMPTOM  OR DIAGNOSIS REQUIRED)    Answer:   cough    Order Specific Question:   Is patient pregnant?    Answer:   No    Order Specific Question:   Preferred imaging location?    Answer:   [facility]    Order Specific Question:   Radiology Contrast Protocol - do NOT remove file path    Answer:   \\[email or web address]\Radiant\DXFluoroContrastProtocols.pdf  . CBC  . Comprehensive metabolic panel    Order Specific Question:    Has the patient fasted?    Answer:   No  . Ferritin  . HgB A1c  . POCT urinalysis dipstick  . Hemoccult - 1 Card (office)    Meds ordered this encounter  Medications  . losartan-hydrochlorothiazide (HYZAAR) 50-12.5 MG tablet    Sig: Take 1 tablet by mouth daily.    Dispense:  30 tablet    Refill:  1     Clemetine Marker, MD PGY-2 [facility]

## 2018-12-16 NOTE — Patient Instructions (Signed)
I will follow-up with the results of the MRI when I get them.  I have also ordered some blood work and a chest x-ray to check for causes of your cough and fatigue.  I will let you know the results of these as well.   In the meantime, for your weight loss he can try supplementing with nutritional shakes such as Glucerna or boost.  I like to see you back in 2 weeks so we can discuss the results of your tests.  In the meantime you may return to work as you are able.  Clemetine Marker, MD

## 2018-12-17 ENCOUNTER — Telehealth: Payer: Self-pay | Admitting: *Deleted

## 2018-12-17 ENCOUNTER — Telehealth: Payer: Self-pay

## 2018-12-17 LAB — COMPREHENSIVE METABOLIC PANEL
ALT: 16 IU/L (ref 0–32)
AST: 23 IU/L (ref 0–40)
Albumin/Globulin Ratio: 2.2 (ref 1.2–2.2)
Albumin: 4.3 g/dL (ref 3.8–4.8)
Alkaline Phosphatase: 95 IU/L (ref 39–117)
BUN/Creatinine Ratio: 14 (ref 9–23)
BUN: 14 mg/dL (ref 6–24)
Bilirubin Total: 0.2 mg/dL (ref 0.0–1.2)
CO2: 16 mmol/L — ABNORMAL LOW (ref 20–29)
Calcium: 9 mg/dL (ref 8.7–10.2)
Chloride: 97 mmol/L (ref 96–106)
Creatinine, Ser: 0.98 mg/dL (ref 0.57–1.00)
GFR calc Af Amer: 79 mL/min/{1.73_m2} (ref >59)
GFR calc non Af Amer: 68 mL/min/{1.73_m2} (ref >59)
Globulin, Total: 2 g/dL (ref 1.5–4.5)
Glucose: 153 mg/dL — ABNORMAL HIGH (ref 65–99)
Potassium: 4.1 mmol/L (ref 3.5–5.2)
Sodium: 133 mmol/L — ABNORMAL LOW (ref 134–144)
Total Protein: 6.3 g/dL (ref 6.0–8.5)

## 2018-12-17 LAB — CBC
Hematocrit: 29.8 % — ABNORMAL LOW (ref 34.0–46.6)
Hemoglobin: 10.3 g/dL — ABNORMAL LOW (ref 11.1–15.9)
MCH: 31.8 pg (ref 26.6–33.0)
MCHC: 34.6 g/dL (ref 31.5–35.7)
MCV: 92 fL (ref 79–97)
Platelets: 184 10*3/uL (ref 150–450)
RBC: 3.24 x10E6/uL — ABNORMAL LOW (ref 3.77–5.28)
RDW: 13.5 % (ref 11.7–15.4)
WBC: 5.3 10*3/uL (ref 3.4–10.8)

## 2018-12-17 LAB — FERRITIN: Ferritin: 64 ng/mL (ref 15–150)

## 2018-12-17 NOTE — Telephone Encounter (Signed)
-----   Message from Benay Pike, MD sent at 12/17/2018  2:17 PM EDT ----- Regarding: imaging appt We scheduled this person an MRI of her lumbar spine when I was on [place] yesterday but I think the date is too far out.  Can someone call the imaging center or a different imaging location to try to get a date as soon as possible?   Thanks,   Linna Hoff

## 2018-12-17 NOTE — Telephone Encounter (Signed)
Tried to get this scheduled earlier and the earliest I could get it was on Saturday 12/26/2018, contacted pt to see if she would be able to go there on a Saturday and she said she would not be here and also [facility] was scheduling out further that 12/29/2018 so I told pt to just keep the original appointment.  Routing to Dr. Jeannine Kitten as an [place].Kamareon Sciandra Zimmerman Rumple, CMA

## 2018-12-17 NOTE — Telephone Encounter (Signed)
Called and someone else picked up the phone. Asked them to tell Christine [name] to call the clinic back when she is able.  If she calls back let Christine [name] know that Christine [name] labs were normal for the most part except for high blood sugar levels, but were not high enough to explain Christine [name] symptoms. Christine [name] hgb was also low but consistent with previously low values and not likely the cause of Christine [name] symptoms.   I am trying to get Christine [name] MRI appointment moved up sooner.

## 2018-12-17 NOTE — Telephone Encounter (Signed)
Patient calls nurse line stating she missed a call last night from someone. Patient stated she is still not feeling any better, has a lot of pressure in her head. Patient requesting a call back with lab results.

## 2018-12-18 NOTE — Assessment & Plan Note (Signed)
I am concerned that the patient has nerve damage causing her fecal incontinence and decreased rectal tone.  Will get MRI lumbar region for further evaluation.  If this is negative may refer to GI.  Not having saddle anesthesia or urinary incontinence. -MRI lumbar spine - FOBT - CMP -

## 2018-12-18 NOTE — Assessment & Plan Note (Signed)
Patient has a history of varying levels of normocytic anemia.  Could be possible cause of her fatigue.  Unlikely that it is cardiac given her lack of dyspnea on exertion, orthopnea, crackles or edema on exam. - CBC, ferritin

## 2018-12-18 NOTE — Assessment & Plan Note (Signed)
Chronic and productive of clear to yellowish sputum.  Differential includes chronic bronchitis/COPD, lisinopril side effect, allergies, heart failure. - Switch lisinopril-hydrochlorothiazide to losartan-hydrochlorothiazide. -Chest x-ray - Consider allergist referral if other cause not identified.

## 2018-12-22 NOTE — Telephone Encounter (Signed)
Tried to contact pt to inform her that her MRI is rescheduled for 12/23/2018 @ [facility].  She is to arrive at 4:30pm to have this done.  She can go in at the [facility] and tell them she needs to go to Radiology.  If she calls back please inform her of this. Kristy Schomburg Zimmerman Rumple, CMA

## 2018-12-22 NOTE — Telephone Encounter (Signed)
Reviewing this as preceptor. [facility] team - we can change this order to STAT if need be. Need this study ASAP.  Leeanne Rio, MD

## 2018-12-22 NOTE — Telephone Encounter (Signed)
Tried to contact pt again to inform her of the new date of her MRI and phone only rang and then there was a busy signal.  If pt calls give her the time and Location of her MRI. Routing to Dr. Jeannine Kitten and Dr. Ardelia Mems as an [place] and to have them change order to STAT. Christine [name], CMA

## 2018-12-23 ENCOUNTER — Ambulatory Visit (HOSPITAL_COMMUNITY): Admission: RE | Admit: 2018-12-23 | Payer: Self-pay | Source: Ambulatory Visit

## 2018-12-23 NOTE — Telephone Encounter (Signed)
Tried to contact pt and mom answered phone and said she was at work and I told her to please have her call.  Informed her to let pt know that the MRI is scheduled for today at 4:30pm due to the doctors wanting her to get the imaging before next week.  Mom said she would inform pt and I told her to call if she has any questions.April Zimmerman Rumple, CMA

## 2018-12-23 NOTE — Telephone Encounter (Signed)
Dan please change this order to stat so that the result will come to you, thanks Leeanne Rio, MD

## 2018-12-23 NOTE — Telephone Encounter (Signed)
done

## 2018-12-23 NOTE — Addendum Note (Signed)
Addended by: Benay Pike on: 12/23/2018 02:59 PM   Modules accepted: Orders

## 2018-12-29 ENCOUNTER — Ambulatory Visit (HOSPITAL_COMMUNITY): Payer: Medicaid Other

## 2018-12-30 ENCOUNTER — Other Ambulatory Visit: Payer: Self-pay

## 2018-12-30 ENCOUNTER — Ambulatory Visit (INDEPENDENT_AMBULATORY_CARE_PROVIDER_SITE_OTHER): Payer: Self-pay | Admitting: Family Medicine

## 2018-12-30 VITALS — BP 168/80 | HR 96

## 2018-12-30 DIAGNOSIS — R159 Full incontinence of feces: Secondary | ICD-10-CM

## 2018-12-30 MED ORDER — LOPERAMIDE HCL 2 MG PO TABS: 2.0000 mg | ORAL_TABLET | Freq: Four times a day (QID) | ORAL | 0 refills | Status: DC | PRN

## 2018-12-30 MED ORDER — METAMUCIL 0.52 G PO CAPS: 0.5200 g | ORAL_CAPSULE | Freq: Every day | ORAL | 0 refills | Status: DC

## 2018-12-30 NOTE — Patient Instructions (Signed)
It was great meeting you today!  I am sorry you have been having trouble with your fecal incontinence.  We went over your labs from last visit which all were good.  I do agree with getting the MRI simply keep that appointment.  I will also like to get a stool sample to check for an infectious cause of your diarrhea.  We can also try and slow it down with Imodium and Metamucil.  Metamucil is a fiber supplement which should help provide bulk.

## 2018-12-31 ENCOUNTER — Other Ambulatory Visit: Payer: Self-pay

## 2018-12-31 MED ORDER — PREDNISONE 5 MG PO TABS: 10.0000 mg | ORAL_TABLET | Freq: Every day | ORAL | 0 refills | Status: DC

## 2019-01-01 NOTE — Progress Notes (Signed)
   HPI 48 year old female who presents for fecal incontinence.  She states that she does not know when she is going.  Is a mixture of well formed stools and diarrhea she states this problem is been going on for several months now.  She was last seen in clinic on 12/16/2018.  At that appointment she was noted to have poor anal sphincter tone but no accompanying loss of sensation in those dermatomal distributions.  She had an MRI scheduled for 01/08/2019.  She underwent testing for urine dipstick, hemoglobin A1c, CBC, CMP, ferritin.  These were all essentially normal.  She states that since that visit she had 2 come from work 2 times to change clothes after stooling herself.  She has no new symptoms.  No urinary incontinence, no gait abnormality, no lower extremity muscle weakness.  Patient asked that not to repeat her sphincter evaluation citing pain from last check.  CC: Fecal incontinence   ROS:   Review of Systems See HPI for ROS.   CC, SH/smoking status, and VS noted  Objective: BP (!) 168/80   Pulse 96   LMP 11/30/2018 (Approximate)   SpO2 84%  Gen: 48 year old African-American female, very pleasant, no acute distress HEENT: Moist mucous membranes, no cervical lymphadenopathy, EOMI, PERRLA. CV: Regular rate rhythm, no M/R/G.  Resp: Lungs clear to auscultation bilaterally, no sensory muscle use Abd: Soft, nontender, nondistended. Neuro: Alert and oriented, Speech clear, No gross deficits   Assessment and plan:  Fecal incontinence Unclear etiology of patient's continued fecal incontinence.  Lab work from previous visit was reviewed and was essentially normal.  MRI lumbar spine is pending on 01/08/2019.  Will help determine if patient has spinal cord compression causing these issues.  Will add on GI stool profile to see if there is an infectious cause for patient's symptoms.  Will attempt to thicken stools with Metamucil fiber supplementation, and add loperamide to see if slowing down  her GI motility will be beneficial.  If the MRI does not reveal any obvious cause these measures do not work, will likely refer patient to GI for further evaluation   Orders Placed This Encounter  Procedures  . GI Profile, Stool, PCR    Standing Status:   Future    Number of Occurrences:   1    Standing Expiration Date:   12/30/2019    Meds ordered this encounter  Medications  . loperamide (IMODIUM A-D) 2 MG tablet    Sig: Take 1 tablet (2 mg total) by mouth 4 (four) times daily as needed for diarrhea or loose stools.    Dispense:  30 tablet    Refill:  0  . psyllium (METAMUCIL) 0.52 g capsule    Sig: Take 1 capsule (0.52 g total) by mouth daily.    Dispense:  30 capsule    Refill:  0     Guadalupe Asmi MD PGY-3 Family Medicine Resident  01/02/2019 7:22 PM

## 2019-01-02 NOTE — Assessment & Plan Note (Addendum)
Unclear etiology of patient's continued fecal incontinence.  Lab work from previous visit was reviewed and was essentially normal.  MRI lumbar spine is pending on 01/08/2019.  Will help determine if patient has spinal cord compression causing these issues.  Will add on GI stool profile to see if there is an infectious cause for patient's symptoms.  Will attempt to thicken stools with Metamucil fiber supplementation, and add loperamide to see if slowing down her GI motility will be beneficial.  If the MRI does not reveal any obvious cause these measures do not work, will likely refer patient to GI for further evaluation

## 2019-01-05 LAB — GI PROFILE, STOOL, PCR

## 2019-01-07 ENCOUNTER — Ambulatory Visit (HOSPITAL_COMMUNITY): Admission: RE | Admit: 2019-01-07 | Payer: Self-pay | Source: Ambulatory Visit

## 2019-01-10 ENCOUNTER — Ambulatory Visit (HOSPITAL_COMMUNITY)
Admission: RE | Admit: 2019-01-10 | Discharge: 2019-01-10 | Disposition: A | Discharge status: Home / Self Care | Payer: Self-pay | Source: Ambulatory Visit | Attending: Family Medicine | Admitting: Family Medicine

## 2019-01-10 ENCOUNTER — Other Ambulatory Visit: Payer: Self-pay

## 2019-01-10 DIAGNOSIS — R159 Full incontinence of feces: Secondary | ICD-10-CM | POA: Insufficient documentation

## 2019-01-11 ENCOUNTER — Telehealth: Payer: Self-pay | Admitting: Family Medicine

## 2019-01-11 DIAGNOSIS — R159 Full incontinence of feces: Secondary | ICD-10-CM

## 2019-01-11 NOTE — Telephone Encounter (Signed)
Please let the patient know that her GI panel was negative for all tested bacteria and viruses.   Also please let her know that her MRI looked great and there was nothing seen that would account for her fecal incontinence. She had a very small disc bulge that is clinically insignificant and is just an incidental finding. I will go ahead and place a GI referral for further evaluation as we talked about at her last visit with me.  Guadalupe Nhyla MD PGY-3 Family Medicine Resident

## 2019-01-11 NOTE — Telephone Encounter (Signed)
Left message with patient's mother  for patient to call office.  Ozella Almond, [place]

## 2019-01-13 ENCOUNTER — Other Ambulatory Visit: Payer: Self-pay

## 2019-01-13 ENCOUNTER — Telehealth (INDEPENDENT_AMBULATORY_CARE_PROVIDER_SITE_OTHER): Payer: Self-pay | Admitting: Family Medicine

## 2019-01-13 DIAGNOSIS — Z20828 Contact with and (suspected) exposure to other viral communicable diseases: Secondary | ICD-10-CM

## 2019-01-13 DIAGNOSIS — Z20822 Contact with and (suspected) exposure to covid-19: Secondary | ICD-10-CM

## 2019-01-13 NOTE — Progress Notes (Signed)
Virtual Visit via Telephone Note  I connected with [name] on 01/13/19 at  3:10 PM EDT by telephone and verified that I am speaking with the correct person using two identifiers.  Location: Patient: At home Provider: [facility] clinic   I discussed the limitations, risks, security and privacy concerns of performing an evaluation and management service by telephone and the availability of in person appointments. I also discussed with the patient that there may be a patient responsible charge related to this service. The patient expressed understanding and agreed to proceed.   History of Present Illness: At work on 9/11 with a coworker who has since tested positive, no SOB/fevers (has checked daily for work), no changes to taste/smell.     Observations/Objective:   Assessment and Plan: Patient with no distress on the phone is speaking in full sentences and seems to have a ability to process medical information appropriately.  No concerning symptoms we will order a [place] test for her to be allowed to return to work  If this test is negative, and she has no respiratory symptoms fevers or changes in taste or smell she can go back to work immediately.  If this test is positive she will be out for at least 2 weeks.  In addition to the 2 weeks, she will need to be symptom-free with no fevers, shortness of breath, changes in taste or smell for 3 days.  Follow Up Instructions:    I discussed the assessment and treatment plan with the patient. The patient was provided an opportunity to ask questions and all were answered. The patient agreed with the plan and demonstrated an understanding of the instructions.   The patient was advised to call back or seek an in-person evaluation if the symptoms worsen or if the condition fails to improve as anticipated.  I provided 11 minutes of non-face-to-face time during this encounter.   Sherene Sires, DO

## 2019-01-14 ENCOUNTER — Other Ambulatory Visit: Payer: Self-pay

## 2019-01-14 DIAGNOSIS — Z20822 Contact with and (suspected) exposure to covid-19: Secondary | ICD-10-CM

## 2019-01-15 ENCOUNTER — Telehealth: Payer: Self-pay | Admitting: Family Medicine

## 2019-01-15 NOTE — Telephone Encounter (Signed)
Will forward to Dr. Criss Rosales who ordered these images and tests.  Teah Votaw,CMA

## 2019-01-15 NOTE — Telephone Encounter (Signed)
Please call patient, " Dr. Criss Rosales asked [place] to call you and tell you that your coronavirus testing has not resulted yet.  As he told you before this would take 48 to 72 hours to result and it looks like he got the test done yesterday.  It seems that you have also asked about a recent MRI which is ordered by Dr. Kris Mouton, that MRI did not seem to show any source for your for your incontinence.  It was essentially a negative MRI.  Please have a discussion with your primary care doctor if you want to move forward with the referral to GI that Dr. Kris Mouton had talked about.  If need have a larger discussion about these issues you can be scheduled for a telemedicine visit."  -Dr. Criss Rosales

## 2019-01-15 NOTE — Telephone Encounter (Signed)
Pt is calling and would was checking on the status of getting her results from her MRI as well as the results from her COVID test.   Please call pt when results are available.

## 2019-01-16 LAB — NOVEL CORONAVIRUS, NAA: SARS-CoV-2, NAA: NOT DETECTED

## 2019-01-18 ENCOUNTER — Telehealth: Payer: Self-pay | Admitting: Family Medicine

## 2019-01-18 NOTE — Telephone Encounter (Signed)
Patient would like her Covid testing results letter faxed to her employer [employer]) at [phone].

## 2019-01-18 NOTE — Telephone Encounter (Signed)
Appt made for pt on 9/23 with Dr. Criss Rosales. Dr. Enid Derry her PCP is not available for at 3:50 or later appt until 10/7. Ottis Stain, CMA

## 2019-01-20 ENCOUNTER — Telehealth (INDEPENDENT_AMBULATORY_CARE_PROVIDER_SITE_OTHER): Payer: Self-pay | Admitting: Family Medicine

## 2019-01-20 ENCOUNTER — Other Ambulatory Visit: Payer: Self-pay

## 2019-01-20 DIAGNOSIS — R159 Full incontinence of feces: Secondary | ICD-10-CM

## 2019-01-20 NOTE — Progress Notes (Signed)
Virtual Visit via Telephone Note  I connected with Christine [name] on 01/20/19 at  3:50 PM EDT by telephone and verified that I am speaking with the correct person using two identifiers.  Location: Patient: At home Provider: [facility] clinic   I discussed the limitations, risks, security and privacy concerns of performing an evaluation and management service by telephone and the availability of in person appointments. I also discussed with the patient that there may be a patient responsible charge related to this service. The patient expressed understanding and agreed to proceed.   History of Present Illness: Patient with history of incontinence of feces for over 2 months now, has been seen twice in our office since then.  Spinal MRI was ordered and did not show any obvious indication for this, at her last visit Dr. Kris Mouton told her he would place an order for gastroenterology.  She denies any change in her status, no paresthesias, no paralysis, no new injuries, no urinary incontinence.  The situational troubling interfering with her work has been stable for the last 2 months.  She is beginning to get distressed and is embarrassed by this, it is impacting her employment relationships  She had seen the bowel in the past so this is who the referral went to, [facility] had not contacted her so I gave her this information so that she could reach out to them.    Observations/Objective:   Assessment and Plan: Fecal incontinence Stable and present: Patient with unchanging but still present and therefore bothersome fecal incontinence, it is caused her to leave work a couple times.  We discussed that the MRI did not expose an obvious cause for this and that GI referral has already been placed by Dr. Kris Mouton.  We informed her that the referral was sent to the [facility] which is a practice she is already seen before, she will call about her and try to schedule appointment immediately.  We did discuss ED return  precautions and she is aware the plan.    Follow Up Instructions:    I discussed the assessment and treatment plan with the patient. The patient was provided an opportunity to ask questions and all were answered. The patient agreed with the plan and demonstrated an understanding of the instructions.   The patient was advised to call back or seek an in-person evaluation if the symptoms worsen or if the condition fails to improve as anticipated.  I provided 10 minutes of non-face-to-face time during this encounter.   Sherene Sires, DO

## 2019-01-20 NOTE — Telephone Encounter (Signed)
A new letter needs to be written for patient stating the results of her test and that she is allowed to return to work.  Will forward to MD. Johnney Ou

## 2019-01-20 NOTE — Telephone Encounter (Signed)
Informed pt that the letter was in her MyChart and she said that she could not print it.  I told her that she could come by and pick it up due to [place] not having a form completed giving [place] permission to fax it to her work.Christine [name], CMA

## 2019-01-20 NOTE — Telephone Encounter (Signed)
Patient was requesting work note saying that she has tested negative and should go back to work.  Letter now put into communications tab for staff to send to patient.  Dr. Criss Rosales

## 2019-01-20 NOTE — Assessment & Plan Note (Signed)
Stable and present: Patient with unchanging but still present and therefore bothersome fecal incontinence, it is caused her to leave work a couple times.  We discussed that the MRI did not expose an obvious cause for this and that GI referral has already been placed by Dr. Kris Mouton.  We informed her that the referral was sent to the [facility] which is a practice she is already seen before, she will call about her and try to schedule appointment immediately.  We did discuss ED return precautions and she is aware the plan.

## 2019-01-25 ENCOUNTER — Telehealth: Payer: Self-pay | Admitting: *Deleted

## 2019-01-25 NOTE — Telephone Encounter (Signed)
Pt states that she has 2 episodes of bright red blood today from her rectum.  Pt could only tell me that "it was a lot" when I asked her to describe it. She also states that she had abdominal pain on and off today.   Advised that she needed to go to the ED given her bright red blood.

## 2019-01-26 ENCOUNTER — Emergency Department (HOSPITAL_COMMUNITY)
Admission: EM | Admit: 2019-01-26 | Discharge: 2019-01-26 | Disposition: A | Discharge status: Home / Self Care | Payer: Medicaid Other | Attending: Emergency Medicine | Admitting: Emergency Medicine

## 2019-01-26 DIAGNOSIS — E119 Type 2 diabetes mellitus without complications: Secondary | ICD-10-CM | POA: Insufficient documentation

## 2019-01-26 DIAGNOSIS — Z7984 Long term (current) use of oral hypoglycemic drugs: Secondary | ICD-10-CM | POA: Insufficient documentation

## 2019-01-26 DIAGNOSIS — R030 Elevated blood-pressure reading, without diagnosis of hypertension: Secondary | ICD-10-CM

## 2019-01-26 DIAGNOSIS — F17210 Nicotine dependence, cigarettes, uncomplicated: Secondary | ICD-10-CM | POA: Insufficient documentation

## 2019-01-26 DIAGNOSIS — K922 Gastrointestinal hemorrhage, unspecified: Secondary | ICD-10-CM | POA: Insufficient documentation

## 2019-01-26 DIAGNOSIS — I10 Essential (primary) hypertension: Secondary | ICD-10-CM | POA: Insufficient documentation

## 2019-01-26 DIAGNOSIS — Z79899 Other long term (current) drug therapy: Secondary | ICD-10-CM | POA: Insufficient documentation

## 2019-01-26 LAB — TYPE AND SCREEN
ABO/RH(D): O POS
Antibody Screen: NEGATIVE

## 2019-01-26 LAB — I-STAT BETA HCG BLOOD, ED (MC, WL, AP ONLY): I-stat hCG, quantitative: <5 m[IU]/mL (ref <5)

## 2019-01-26 LAB — CBC
HCT: 31 % — ABNORMAL LOW (ref 36.0–46.0)
Hemoglobin: 10.2 g/dL — ABNORMAL LOW (ref 12.0–15.0)
MCH: 32.9 pg (ref 26.0–34.0)
MCHC: 32.9 g/dL (ref 30.0–36.0)
MCV: 100 fL (ref 80.0–100.0)
Platelets: 123 10*3/uL — ABNORMAL LOW (ref 150–400)
RBC: 3.1 MIL/uL — ABNORMAL LOW (ref 3.87–5.11)
RDW: 16 % — ABNORMAL HIGH (ref 11.5–15.5)
WBC: 3.8 10*3/uL — ABNORMAL LOW (ref 4.0–10.5)
nRBC: 0 % (ref 0.0–0.2)

## 2019-01-26 LAB — COMPREHENSIVE METABOLIC PANEL
ALT: 22 U/L (ref 0–44)
AST: 47 U/L — ABNORMAL HIGH (ref 15–41)
Albumin: 3.5 g/dL (ref 3.5–5.0)
Alkaline Phosphatase: 76 U/L (ref 38–126)
Anion gap: 11 (ref 5–15)
BUN: 8 mg/dL (ref 6–20)
CO2: 20 mmol/L — ABNORMAL LOW (ref 22–32)
Calcium: 9 mg/dL (ref 8.9–10.3)
Chloride: 108 mmol/L (ref 98–111)
Creatinine, Ser: 1.16 mg/dL — ABNORMAL HIGH (ref 0.44–1.00)
GFR calc Af Amer: >60 mL/min (ref >60)
GFR calc non Af Amer: 56 mL/min — ABNORMAL LOW (ref >60)
Glucose, Bld: 227 mg/dL — ABNORMAL HIGH (ref 70–99)
Potassium: 4.1 mmol/L (ref 3.5–5.1)
Sodium: 139 mmol/L (ref 135–145)
Total Bilirubin: 0.4 mg/dL (ref 0.3–1.2)
Total Protein: 6.2 g/dL — ABNORMAL LOW (ref 6.5–8.1)

## 2019-01-26 LAB — ABO/RH: ABO/RH(D): O POS

## 2019-01-26 LAB — POC OCCULT BLOOD, ED: Fecal Occult Bld: POSITIVE — AB

## 2019-01-26 MED ORDER — SODIUM CHLORIDE 0.9 % IV BOLUS
500.0000 mL | Freq: Once | INTRAVENOUS | Status: AC
  Administered 2019-01-26: 500 mL via INTRAVENOUS

## 2019-01-26 NOTE — ED Triage Notes (Signed)
Pt arrives to ED with c/o of GI bleeding that started yesterday. Pt states it started out dark red and has changed to more of a bright color. This has occurred about 5 times.

## 2019-01-26 NOTE — ED Notes (Signed)
Patient verbalizes understanding of discharge instructions. Opportunity for questioning and answers were provided. Armband removed by staff, pt discharged from ED.  

## 2019-01-26 NOTE — Discharge Instructions (Signed)
You have been diagnosed today with lower gastrointestinal bleeding and elevated blood pressure reading.  At this time there does not appear to be the presence of an emergent medical condition, however there is always the potential for conditions to change. Please read and follow the below instructions.  Please return to the Emergency Department immediately for any new or worsening symptoms. Please be sure to follow up with your Primary Care Provider within one week regarding your visit today; please call their office to schedule an appointment even if you are feeling better for a follow-up visit. Please go to your gastroenterology appointment on Wednesday for further evaluation of your bleeding.  They may want to pursue a colonoscopy for further evaluation. Your blood pressure was elevated today.  Please take your blood pressure medications as prescribed by your primary care provider.  Call your primary care doctor's office today to schedule a follow-up appointment this week for blood pressure recheck and medication management.  Get help right away if you: Get a very bad headache. Start to feel mixed up (confused). Feel weak or numb. Feel faint. Have very bad pain in your: Chest. Belly (abdomen). Throw up more than once. Have trouble breathing. Your bleeding does not stop. You feel dizzy or you pass out (faint). You feel weak. You have very bad cramps in your back or belly. You pass large clumps of blood (clots) in your poop. Your symptoms are getting worse. You have chest pain or fast heartbeats. Any new/concerning or worsening symptoms.  Please read the additional information packets attached to your discharge summary.  Do not take your medicine if  develop an itchy rash, swelling in your mouth or lips, or difficulty breathing; call 911 and seek immediate emergency medical attention if this occurs.  Note: Portions of this text may have been transcribed using voice recognition  software. Every effort was made to ensure accuracy; however, inadvertent computerized transcription errors may still be present.

## 2019-01-26 NOTE — ED Provider Notes (Signed)
[facility] EMERGENCY DEPARTMENT Provider Note   CSN: [id number] Arrival date & time: 01/26/19  1024     History   Chief Complaint Chief Complaint  Patient presents with  . GI Bleeding    HPI Christine [name] is a 48 y.o. female with history of alcohol abuse, diabetes, pancreatitis, hypertension, substance abuse presents today for concern of blood in the stool.  Patient reports that yesterday she noticed small spots of dark red blood in her stool.  She reports that she did not think much about it until today when she had a second bowel movement and again noticed the blood.  She denies any associated pain, she reports she had blood in her stool many years ago but never sought evaluation for it.  Denies fever/chills, headache, chest pain/shortness of breath, dizziness/lightheadedness, abdominal pain, nausea/vomiting, diarrhea, pain of the extremities or any additional concerns.    HPI  Past Medical History:  Diagnosis Date  . Alcohol abuse   . Cellulitis 11/2016   RIGHT ARM  . Chronic pancreatitis ([place])   . Diabetes mellitus   . Hypertension   . Substance abuse [facility])     Patient Active Problem List   Diagnosis Date Noted  . Fecal incontinence 12/14/2018  . Fatigue 12/14/2018  . Chronic cough 07/28/2018  . Syncope 05/14/2018  . Alcohol use 03/30/2018  . Abnormal weight loss 01/13/2017  . Chronic contact dermatitis 11/19/2016  . Tinea versicolor 08/31/2014  . 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 Vitamin D deficiency 11/01/2013  . Lower extremity neuropathy 10/28/2013  . Venous insufficiency 02/05/2013  . Essential hypertension, benign 12/25/2012  . Chronic alcoholic  pancreatitis 41/66/0630  . Protein-calorie malnutrition, severe ([place]) 11/18/2012  . Depression 09/29/2012  . Neuropathic pain 01/29/2011  . DRUG ABUSE, HX OF 07/05/2009  . ANEMIA 09/28/2008  . HYPERLIPIDEMIA 09/12/2008  . Tobacco use disorder 09/12/2008  .  Diabetes mellitus with neuropathy ([place]) 06/26/2006    Past Surgical History:  Procedure Laterality Date  . BREAST REDUCTION SURGERY       OB History   No obstetric history on file.      Home Medications    Prior to Admission medications   Medication Sig Start Date End Date Taking? Authorizing Provider  ACCU-CHEK FASTCLIX LANCETS MISC Check sugar 5 x daily 12/12/14   Leone Brand, MD  acetaminophen (TYLENOL) 325 MG tablet Take 2 tablets (650 mg total) by mouth every 6 (six) hours as needed for mild pain (or Fever >/= 101). 12/11/16   Bonnita Hollow, MD  Blood Glucose Monitoring Suppl (ACCU-CHEK ADVANTAGE DIABETES) kit Use as instructed 10/28/13   Raoul Pitch, Renee A, DO  gabapentin (NEURONTIN) 300 MG capsule TAKE 1 CAPSULE BY MOUTH THREE TIMES A DAY 11/18/18   Enid Derry, [place], DO  glucose blood (ACCU-CHEK AVIVA) test strip Check sugar 6 x daily 05/10/15   Melancon, York Ram, MD  glucose blood test strip Check sugar 5x daily 05/03/15   Leone Brand, MD  loperamide (IMODIUM A-D) 2 MG tablet Take 1 tablet (2 mg total) by mouth 4 (four) times daily as needed for diarrhea or loose stools. 12/30/18   Guadalupe Anela, MD  losartan-hydrochlorothiazide (HYZAAR) 50-12.5 MG tablet Take 1 tablet by mouth daily. 12/16/18   Benay Pike, MD  metFORMIN (GLUCOPHAGE) 1000 MG tablet TAKE 1 TABLET BY MOUTH EVERY DAY WITH BREAKFAST 10/21/18   Lucila Maine C, DO  polyethylene glycol (MIRALAX / GLYCOLAX) packet Take 17 g by mouth  daily as needed for mild constipation. 12/11/16   Bonnita Hollow, MD  predniSONE (DELTASONE) 5 MG tablet Take 2 tablets (10 mg total) by mouth daily with breakfast. 12/31/18   Shirley, [place], DO  psyllium (METAMUCIL) 0.52 g capsule Take 1 capsule (0.52 g total) by mouth daily. 12/30/18   Guadalupe Azilee, MD  triamcinolone (KENALOG) 0.025 % ointment Apply 1 application topically 2 (two) times daily. 01/23/17   Steve Rattler, DO    Family History No family history on file.  Social  History Social History   Tobacco Use  . Smoking status: Current Every Day Smoker    Packs/day: 0.50    Years: 20.00    Pack years: 10.00    Types: Cigarettes  . Smokeless tobacco: Never Used  . Tobacco comment: working on quitting  Substance Use Topics  . Alcohol use: Yes    Alcohol/week: 0.0 standard drinks    Comment: 11/2016 " i DRINK LESS THAN i USED TO,BUT i DRINK BEER EVERYDAY  . Drug use: No     Allergies   Patient has no known allergies.   Review of Systems Review of Systems Ten systems are reviewed and are negative for acute change except as noted in the HPI  Physical Exam Updated Vital Signs BP (!) 191/90   Pulse 70   Temp 98.3 F (36.8 C) (Oral)   Resp 16   LMP 11/21/2018   SpO2 97%   Physical Exam Constitutional:      General: She is not in acute distress.    Appearance: Normal appearance. She is well-developed. She is not ill-appearing or diaphoretic.  HENT:     Head: Normocephalic and atraumatic.     Right Ear: External ear normal.     Left Ear: External ear normal.     Nose: Nose normal.  Eyes:     General: Vision grossly intact. Gaze aligned appropriately.     Pupils: Pupils are equal, round, and reactive to light.  Neck:     Musculoskeletal: Normal range of motion.     Trachea: Trachea and phonation normal. No tracheal deviation.  Pulmonary:     Effort: Pulmonary effort is normal. No respiratory distress.  Abdominal:     General: There is no distension.     Palpations: Abdomen is soft.     Tenderness: There is no abdominal tenderness. There is no guarding or rebound.  Genitourinary:    Comments: Rectal examination chaperoned by Su Grand.  Small external nonbleeding hemorrhoid.  No palpable internal hemorrhoids or fissures.  No gross blood on examination.  Light brown stool.  Normal rectal tone. Musculoskeletal: Normal range of motion.  Skin:    General: Skin is warm and dry.  Neurological:     Mental Status: She is alert.     GCS: GCS  eye subscore is 4. GCS verbal subscore is 5. GCS motor subscore is 6.     Comments: Speech is clear and goal oriented, follows commands Major Cranial nerves without deficit, no facial droop Moves extremities without ataxia, coordination intact  Psychiatric:        Behavior: Behavior normal.      ED Treatments / Results  Labs (all labs ordered are listed, but only abnormal results are displayed) Labs Reviewed  COMPREHENSIVE METABOLIC PANEL - Abnormal; Notable for the following components:      Result Value   CO2 20 (*)    Glucose, Bld 227 (*)    Creatinine, Ser 1.16 (*)  Total Protein 6.2 (*)    AST 47 (*)    GFR calc non Af Amer 56 (*)    All other components within normal limits  CBC - Abnormal; Notable for the following components:   WBC 3.8 (*)    RBC 3.10 (*)    Hemoglobin 10.2 (*)    HCT 31.0 (*)    RDW 16.0 (*)    Platelets 123 (*)    All other components within normal limits  POC OCCULT BLOOD, ED - Abnormal; Notable for the following components:   Fecal Occult Bld POSITIVE (*)    All other components within normal limits  I-STAT BETA HCG BLOOD, ED ([facility], [facility], [facility] ONLY)  TYPE AND SCREEN  ABO/RH    EKG None  Radiology No results found.  Procedures Procedures (including critical care time)  Medications Ordered in ED Medications  sodium chloride 0.9 % bolus 500 mL (500 mLs Intravenous New Bag/Given 01/26/19 1418)     Initial Impression / Assessment and Plan / ED Course  I have reviewed the triage vital signs and the nursing notes.  Pertinent labs & imaging results that were available during my care of the patient were reviewed by me and considered in my medical decision making (see chart for details).    Type and screen: O+, negative antibody Beta-hCG negative CBC with hemoglobin 10.2, appears baseline CMP with creatinine 1.16 and glucose 227, both slightly above baseline Hemoccult positive - Fluid bolus given, patient reassessed resting comfortably  and in no acute distress.  I discussed with her results as above and plan of care.  She reports that she has follow-up at [facility] gastroenterology on Wednesday, she reports she is referred by her primary care doctor due to diarrhea a few weeks ago which resolved.  I have encouraged patient to maintain this appointment and discuss with them her bleeding today for further evaluation and discussion of colonoscopy.  On reevaluation patient has a soft nontender abdomen without distention or peritoneal signs.  Tolerating p.o.  No indication for further work-up or admission at this time.  Additionally patient noted to be hypertensive here in the emergency department, I discussed this with the patient she reports that she has not been taking her antihypertensives regularly.  She is asymptomatic regarding her hypertension today I have encouraged her to take her medications as directed by her primary care provider and to call them today to schedule a follow-up appointment for blood pressure recheck and medication management this week.  She reports that she does not need refills on her antihypertensives today.  At this time there does not appear to be any evidence of an acute emergency medical condition and the patient appears stable for discharge with appropriate outpatient follow up. Diagnosis was discussed with patient who verbalizes understanding of care plan and is agreeable to discharge. I have discussed return precautions with patient who verbalizes understanding of return precautions. Patient encouraged to follow-up with their PCP and GI. All questions answered.  Patient's case discussed with Dr. Laverta Baltimore who agrees with plan to discharge with outpatient GI follow-up.   Note: Portions of this report may have been transcribed using voice recognition software. Every effort was made to ensure accuracy; however, inadvertent computerized transcription errors may still be present. Final Clinical Impressions(s) / ED  Diagnoses   Final diagnoses:  Lower GI bleed  Elevated blood pressure reading    ED Discharge Orders    None       Deliah Boston, PA-C  01/26/19 1603    Margette Fast, MD 01/27/19 0830

## 2019-01-28 ENCOUNTER — Telehealth: Payer: Self-pay

## 2019-01-28 NOTE — Telephone Encounter (Signed)
Patient calls nurse line stating she was seen in ED for GI bleed. Patient has an apt coming up with gastro next week. Patient stated she is still feeling bad, "I am in a lot of pain." Patient is wanting a not excusing her from work starting 9/30 to return 10/5. I advised patient she may need a FU apt at our office, however I will ask being today is Thursday. Please advise.

## 2019-01-28 NOTE — Telephone Encounter (Signed)
Please schedule patient for ATC on 10/2

## 2019-01-29 ENCOUNTER — Ambulatory Visit: Payer: Medicaid Other | Admitting: Family Medicine

## 2019-01-29 NOTE — Telephone Encounter (Signed)
No openings today. Placed on [place] schedule. LVM on patients phone with time.

## 2019-02-03 ENCOUNTER — Other Ambulatory Visit (INDEPENDENT_AMBULATORY_CARE_PROVIDER_SITE_OTHER): Payer: Self-pay

## 2019-02-03 ENCOUNTER — Other Ambulatory Visit: Payer: Self-pay

## 2019-02-03 ENCOUNTER — Encounter: Payer: Self-pay | Admitting: Physician Assistant

## 2019-02-03 ENCOUNTER — Ambulatory Visit (INDEPENDENT_AMBULATORY_CARE_PROVIDER_SITE_OTHER): Payer: Self-pay | Admitting: Physician Assistant

## 2019-02-03 VITALS — BP 120/68 | HR 76 | Temp 98.8°F | Ht 69.0 in | Wt 132.0 lb

## 2019-02-03 DIAGNOSIS — R634 Abnormal weight loss: Secondary | ICD-10-CM

## 2019-02-03 DIAGNOSIS — R630 Anorexia: Secondary | ICD-10-CM

## 2019-02-03 DIAGNOSIS — K921 Melena: Secondary | ICD-10-CM

## 2019-02-03 DIAGNOSIS — R159 Full incontinence of feces: Secondary | ICD-10-CM

## 2019-02-03 LAB — CBC WITH DIFFERENTIAL/PLATELET
Basophils Absolute: 0 10*3/uL (ref 0.0–0.1)
Basophils Relative: 0.9 % (ref 0.0–3.0)
Eosinophils Absolute: 0 10*3/uL (ref 0.0–0.7)
Eosinophils Relative: 0.9 % (ref 0.0–5.0)
HCT: 30.3 % — ABNORMAL LOW (ref 36.0–46.0)
Hemoglobin: 10.1 g/dL — ABNORMAL LOW (ref 12.0–15.0)
Lymphocytes Relative: 14.9 % (ref 12.0–46.0)
Lymphs Abs: 0.6 10*3/uL — ABNORMAL LOW (ref 0.7–4.0)
MCHC: 33.3 g/dL (ref 30.0–36.0)
MCV: 100.9 fl — ABNORMAL HIGH (ref 78.0–100.0)
Monocytes Absolute: 0.2 10*3/uL (ref 0.1–1.0)
Monocytes Relative: 3.6 % (ref 3.0–12.0)
Neutro Abs: 3.4 10*3/uL (ref 1.4–7.7)
Neutrophils Relative %: 79.7 % — ABNORMAL HIGH (ref 43.0–77.0)
Platelets: 173 10*3/uL (ref 150.0–400.0)
RBC: 3 Mil/uL — ABNORMAL LOW (ref 3.87–5.11)
RDW: 17.1 % — ABNORMAL HIGH (ref 11.5–15.5)
WBC: 4.3 10*3/uL (ref 4.0–10.5)

## 2019-02-03 LAB — IBC PANEL
Iron: 70 ug/dL (ref 42–145)
Saturation Ratios: 22 % (ref 20.0–50.0)
Transferrin: 227 mg/dL (ref 212.0–360.0)

## 2019-02-03 LAB — FERRITIN: Ferritin: 45.2 ng/mL (ref 10.0–291.0)

## 2019-02-03 NOTE — Patient Instructions (Addendum)
Your provider has requested that you go to the basement level for lab work before leaving today. Press "B" on the elevator. The lab is located at the first door on the left as you exit the elevator.  You have been scheduled for an endoscopy and colonoscopy. Please follow the written instructions given to you at your visit today. Please pick up your prep supplies at the pharmacy within the next 1-3 days. If you use inhalers (even only as needed), please bring them with you on the day of your procedure.  Stop Goody Powders  Start Tylenol or Ibuprofen - 1-2 a day

## 2019-02-03 NOTE — Progress Notes (Signed)
Chief Complaint: Fecal incontinence, diarrhea, hematochezia  HPI:    Christine [name] is a 48 year old African-American female with past medical history as listed below, who was referred to me by Zenia Resides, MD for a complaint of hematochezia, fecal incontinence and diarrhea.      01/26/2019 patient seen in the ED for lower GI bleeding.  At that time described small spots of dark red blood in her stool.  Small external nonbleeding hemorrhoid on exam.  Hemoglobin 10.2 (baseline around 11), fecal occult positive.    Today, the patient presents to clinic and tells me that over the past 3 to 4 weeks she has been experiencing urgenct watery stool at least 3 times a day. Does occasionally have a solid stool. Also tells me that time she has a bowel movement and does not know what is happening.  Along with this has experienced a 30 pound weight loss over the past year with a decrease in appetite.  Tells me she has also been bleeding for the past 2 to 3 weeks with a bowel movement which is bright red blood/maroon mixed in with the stool and also in the toilet and on the toilet paper.  This is multiple times a day.    Does admit to using Goody powders at least once a day for chronic foot pain per patient.  This has been happening for the past year.    Denies fever, chills, nausea or vomiting.     Past Medical History:  Diagnosis Date  . Alcohol abuse   . Cellulitis 11/2016   RIGHT ARM  . Chronic pancreatitis ([place])   . Diabetes mellitus   . Hypertension   . Substance abuse [facility])     Past Surgical History:  Procedure Laterality Date  . BREAST REDUCTION SURGERY      Current Outpatient Medications  Medication Sig Dispense Refill  . ACCU-CHEK FASTCLIX LANCETS MISC Check sugar 5 x daily 204 each 3  . acetaminophen (TYLENOL) 325 MG tablet Take 2 tablets (650 mg total) by mouth every 6 (six) hours as needed for mild pain (or Fever >/= 101). 30 tablet 1  . Aspirin-Acetaminophen-Caffeine (GOODY  HEADACHE PO) Take by mouth as needed.    . Blood Glucose Monitoring Suppl (ACCU-CHEK ADVANTAGE DIABETES) kit Use as instructed 1 each 0  . gabapentin (NEURONTIN) 300 MG capsule TAKE 1 CAPSULE BY MOUTH THREE TIMES A DAY 270 capsule 1  . glucose blood (ACCU-CHEK AVIVA) test strip Check sugar 6 x daily 200 each 3  . glucose blood test strip Check sugar 5x daily 200 each 5  . losartan-hydrochlorothiazide (HYZAAR) 50-12.5 MG tablet Take 1 tablet by mouth daily. 30 tablet 1  . metFORMIN (GLUCOPHAGE) 1000 MG tablet TAKE 1 TABLET BY MOUTH EVERY DAY WITH BREAKFAST 30 tablet 5  . predniSONE (DELTASONE) 5 MG tablet Take 2 tablets (10 mg total) by mouth daily with breakfast. 60 tablet 0  . triamcinolone (KENALOG) 0.025 % ointment Apply 1 application topically 2 (two) times daily. 30 g 1  . polyethylene glycol (MIRALAX / GLYCOLAX) packet Take 17 g by mouth daily as needed for mild constipation. (Patient not taking: Reported on 02/03/2019) 14 each 0  . psyllium (METAMUCIL) 0.52 g capsule Take 1 capsule (0.52 g total) by mouth daily. (Patient not taking: Reported on 02/03/2019) 30 capsule 0   No current facility-administered medications for this visit.     Allergies as of 02/03/2019  . (No Known Allergies)    No family history  on file.  Social History   Socioeconomic History  . Marital status: Single    Spouse name: Not on file  . Number of children: 3  . Years of education: Not on file  . Highest education level: Not on file  Occupational History  . Occupation: Optician, dispensing  Social Needs  . Financial resource strain: Not on file  . Food insecurity    Worry: Not on file    Inability: Not on file  . Transportation needs    Medical: Not on file    Non-medical: Not on file  Tobacco Use  . Smoking status: Current Every Day Smoker    Packs/day: 0.50    Years: 20.00    Pack years: 10.00    Types: Cigarettes  . Smokeless tobacco: Never Used  . Tobacco comment: working on quitting   Substance and Sexual Activity  . Alcohol use: Yes    Alcohol/week: 0.0 standard drinks    Comment: 11/2016 " i DRINK LESS THAN i USED TO,BUT i DRINK BEER EVERYDAY  . Drug use: No  . Sexual activity: Not on file  Lifestyle  . Physical activity    Days per week: Not on file    Minutes per session: Not on file  . Stress: Not on file  Relationships  . Social Herbalist on phone: Not on file    Gets together: Not on file    Attends religious service: Not on file    Active member of club or organization: Not on file    Attends meetings of clubs or organizations: Not on file    Relationship status: Not on file  . Intimate partner violence    Fear of current or ex partner: Not on file    Emotionally abused: Not on file    Physically abused: Not on file    Forced sexual activity: Not on file  Other Topics Concern  . Not on file  Social History Narrative   Single, 2 sons one daughter. She is working as a Optician, dispensing. She is to drink several beers a day but stopped recently. It is September 2014. 4 caffeinated beverages daily.    Review of Systems:    Constitutional: No weight loss, fever or chills Skin: No rash  Cardiovascular: No chest pain Respiratory: No SOB  Gastrointestinal: See HPI and otherwise negative Genitourinary: No dysuria  Neurological: No headache, dizziness or syncope Musculoskeletal: No new muscle or joint pain Hematologic: No bruising Psychiatric: No history of depression or anxiety   Physical Exam:  Vital signs: BP 120/68   Pulse 76   Temp 98.8 F (37.1 C)   Ht '5\' 9"'  (1.753 m)   Wt 132 lb (59.9 kg)   LMP 12/04/2018 (Approximate)   BMI 19.49 kg/m   Constitutional:   Pleasant thin appearing AA female appears to be in NAD, Well developed, Well nourished, alert and cooperative Head:  Normocephalic and atraumatic. Eyes:   PEERL, EOMI. No icterus. Conjunctiva pink. Ears:  Normal auditory acuity. Neck:  Supple Throat: Oral cavity and  pharynx without inflammation, swelling or lesion.  Respiratory: Respirations even and unlabored. Lungs clear to auscultation bilaterally.   No wheezes, crackles, or rhonchi.  Cardiovascular: Normal S1, S2. No MRG. Regular rate and rhythm. No peripheral edema, cyanosis or pallor.  Gastrointestinal:  Soft, nondistended, nontender. No rebound or guarding. Normal bowel sounds. No appreciable masses or hepatomegaly. Rectal:  Not performed.  Msk:  Symmetrical without gross deformities. Without  edema, no deformity or joint abnormality.  Neurologic:  Alert and  oriented x4;  grossly normal neurologically.  Skin:   Dry and intact without significant lesions or rashes. Psychiatric: Demonstrates good judgement and reason without abnormal affect or behaviors.  MOST RECENT LABS AND IMAGING: CBC    Component Value Date/Time   WBC 3.8 (L) 01/26/2019 1115   RBC 3.10 (L) 01/26/2019 1115   HGB 10.2 (L) 01/26/2019 1115   HGB 10.3 (L) 12/16/2018 1054   HCT 31.0 (L) 01/26/2019 1115   HCT 29.8 (L) 12/16/2018 1054   PLT 123 (L) 01/26/2019 1115   PLT 184 12/16/2018 1054   MCV 100.0 01/26/2019 1115   MCV 92 12/16/2018 1054   MCH 32.9 01/26/2019 1115   MCHC 32.9 01/26/2019 1115   RDW 16.0 (H) 01/26/2019 1115   RDW 13.5 12/16/2018 1054   LYMPHSABS 1.4 03/29/2018 2247   LYMPHSABS 1.3 01/13/2017 1614   MONOABS 0.2 03/29/2018 2247   EOSABS 0.0 03/29/2018 2247   EOSABS 0.0 01/13/2017 1614   BASOSABS 0.0 03/29/2018 2247   BASOSABS 0.0 01/13/2017 1614    CMP     Component Value Date/Time   NA 139 01/26/2019 1115   NA 133 (L) 12/16/2018 1054   K 4.1 01/26/2019 1115   CL 108 01/26/2019 1115   CO2 20 (L) 01/26/2019 1115   GLUCOSE 227 (H) 01/26/2019 1115   BUN 8 01/26/2019 1115   BUN 14 12/16/2018 1054   CREATININE 1.16 (H) 01/26/2019 1115   CREATININE 0.88 08/31/2014 1143   CALCIUM 9.0 01/26/2019 1115   PROT 6.2 (L) 01/26/2019 1115   PROT 6.3 12/16/2018 1054   ALBUMIN 3.5 01/26/2019 1115   ALBUMIN  4.3 12/16/2018 1054   AST 47 (H) 01/26/2019 1115   ALT 22 01/26/2019 1115   ALKPHOS 76 01/26/2019 1115   BILITOT 0.4 01/26/2019 1115   BILITOT 0.2 12/16/2018 1054   GFRNONAA 56 (L) 01/26/2019 1115   GFRNONAA 81 10/28/2013 1150   GFRAA >60 01/26/2019 1115   GFRAA >89 10/28/2013 1150    Assessment: 1.  Weight loss: 30 pounds over the past year without trying, also decrease in appetite, with Goody powder use question gastritis 2.  Fecal incontinence 3.  Diarrhea: Watery and urgent with some fecal incontinence, does occasionally have a solid stool; consider IBS versus colitis versus other 4.  Hematochezia: Over the past 3 to 4 weeks with every bowel movement, in the stool and in the toilet; consider hemorrhoids versus colitis versus other 5.  Anemia: Hemoglobin 10.2, this appears somewhat chronic for the patient, this has never been worked up, will check iron studies today  Plan: 1.  Ordered repeat CBC, iron studies and ferritin 2.  Scheduled patient for colonoscopy and endoscopy given weight loss and anemia and change in bowel habits.  These were scheduled with Dr. Carlean Purl in the [facility].  Did discuss risk, benefits, limitations and alternatives and the patient agrees to proceed. 3.  Patient was advised to stop using Goody powders.  Did discuss that she could start Tylenol 1-2 tabs a day or possibly Ibuprofen 1-2 tabs a day which would be better than the Goody powders, though still not ideal. 4.  Patient to follow in clinic per recommendations from Dr. Carlean Purl after time of procedures.  Christine Newer, PA-C [facility] 02/03/2019, 11:06 AM  Cc: Zenia Resides, MD

## 2019-02-08 ENCOUNTER — Telehealth: Payer: Self-pay

## 2019-02-08 NOTE — Telephone Encounter (Signed)
Left message on 02/04/19 for patient to please call back to give lab results. Tried again today,( no voice mail on either phone today). Sent message by [employer]

## 2019-02-08 NOTE — Telephone Encounter (Signed)
-----   Message from [place], [place] sent at 02/03/2019  2:41 PM EDT ----- No iron deficiency, anemia stable. Proceed with EGD/Colon as discussed. JLL

## 2019-02-26 ENCOUNTER — Other Ambulatory Visit: Payer: Self-pay | Admitting: Internal Medicine

## 2019-02-26 ENCOUNTER — Other Ambulatory Visit: Payer: Self-pay

## 2019-02-26 ENCOUNTER — Other Ambulatory Visit (INDEPENDENT_AMBULATORY_CARE_PROVIDER_SITE_OTHER): Payer: Self-pay

## 2019-02-26 ENCOUNTER — Encounter: Payer: Self-pay | Admitting: Internal Medicine

## 2019-02-26 ENCOUNTER — Ambulatory Visit (AMBULATORY_SURGERY_CENTER): Payer: Self-pay | Admitting: Internal Medicine

## 2019-02-26 VITALS — BP 141/81 | HR 54 | Temp 98.5°F | Resp 12 | Ht 69.0 in | Wt 132.0 lb

## 2019-02-26 DIAGNOSIS — K264 Chronic or unspecified duodenal ulcer with hemorrhage: Secondary | ICD-10-CM

## 2019-02-26 DIAGNOSIS — K860 Alcohol-induced chronic pancreatitis: Secondary | ICD-10-CM

## 2019-02-26 DIAGNOSIS — K2980 Duodenitis without bleeding: Secondary | ICD-10-CM

## 2019-02-26 DIAGNOSIS — K921 Melena: Secondary | ICD-10-CM

## 2019-02-26 DIAGNOSIS — R634 Abnormal weight loss: Secondary | ICD-10-CM

## 2019-02-26 DIAGNOSIS — K2950 Unspecified chronic gastritis without bleeding: Secondary | ICD-10-CM

## 2019-02-26 DIAGNOSIS — B9681 Helicobacter pylori [H. pylori] as the cause of diseases classified elsewhere: Secondary | ICD-10-CM

## 2019-02-26 DIAGNOSIS — K2951 Unspecified chronic gastritis with bleeding: Secondary | ICD-10-CM

## 2019-02-26 DIAGNOSIS — K529 Noninfective gastroenteritis and colitis, unspecified: Secondary | ICD-10-CM

## 2019-02-26 LAB — HCG, QUANTITATIVE, PREGNANCY: Quantitative HCG: <0.6 m[IU]/mL

## 2019-02-26 MED ORDER — PANCRELIPASE (LIP-PROT-AMYL) 36000-114000 UNITS PO CPEP: ORAL_CAPSULE | ORAL | 0 refills | Status: DC

## 2019-02-26 MED ORDER — SODIUM CHLORIDE 0.9 % IV SOLN: 500.0000 mL | INTRAVENOUS | Status: DC

## 2019-02-26 NOTE — Progress Notes (Signed)
Called to room to assist during endoscopic procedure.  Patient ID and intended procedure confirmed with present staff. Received instructions for my participation in the procedure from the performing physician.  

## 2019-02-26 NOTE — Progress Notes (Signed)
To PACU, VSS. Report to [email or web address] 

## 2019-02-26 NOTE — Op Note (Signed)
[place] Patient Name: Christine [name] Procedure Date: 02/26/2019 3:52 PM MRN: [id number] Endoscopist: [name] , MD Age: 48 Referring MD:  Date of Birth: 1970-05-15 Gender: Female Account #: [id number] Procedure:                Colonoscopy Indications:              Chronic diarrhea, Hematochezia, Weight loss Medicines:                Propofol per Anesthesia, Monitored Anesthesia Care Procedure:                Pre-Anesthesia Assessment:                           - Prior to the procedure, a History and Physical                            was performed, and patient medications and                            allergies were reviewed. The patient's tolerance of                            previous anesthesia was also reviewed. The risks                            and benefits of the procedure and the sedation                            options and risks were discussed with the patient.                            All questions were answered, and informed consent                            was obtained. Prior Anticoagulants: The patient has                            taken no previous anticoagulant or antiplatelet                            agents. ASA Grade Assessment: III - A patient with                            severe systemic disease. After reviewing the risks                            and benefits, the patient was deemed in                            satisfactory condition to undergo the procedure.                           After obtaining informed consent, the colonoscope  was passed under direct vision. Throughout the                            procedure, the patient's blood pressure, pulse, and                            oxygen saturations were monitored continuously. The                            Colonoscope was introduced through the anus and                            advanced to the the terminal ileum, with   identification of the appendiceal orifice and IC                            valve. The colonoscopy was somewhat difficult due                            to poor endoscopic visualization. The patient                            tolerated the procedure well. The quality of the                            bowel preparation was fair. The ileocecal valve,                            appendiceal orifice, and rectum were photographed. Scope In: 4:19:05 PM Scope Out: 4:36:56 PM Scope Withdrawal Time: 0 hours 13 minutes 7 seconds  Total Procedure Duration: 0 hours 17 minutes 51 seconds  Findings:                 The perianal and digital rectal examinations were                            normal.                           The terminal ileum appeared normal.                           The colon (entire examined portion) appeared                            normal. Biopsies for histology were taken with a                            cold forceps from the entire colon for evaluation                            of microscopic colitis. Verification of patient                            identification for the specimen was done. Estimated  blood loss was minimal.                           The exam was otherwise without abnormality on                            direct and retroflexion views. Complications:            No immediate complications. Estimated Blood Loss:     Estimated blood loss was minimal. Impression:               - Preparation of the colon was fair.                           - The examined portion of the ileum was normal.                           - The entire examined colon is normal except                            inflamed hemorrhoids. Biopsied colon                           - The examination was otherwise normal on direct                            and retroflexion views. Recommendation:           - Patient has a contact number available for                             emergencies. The signs and symptoms of potential                            delayed complications were discussed with the                            patient. Return to normal activities tomorrow.                            Written discharge instructions were provided to the                            patient.                           - Resume previous diet.                           - Continue present medications.                           - Trial of Creon for chronic pancreatitis                           this exam is not good enough for screening exam -  sticky mucoid stool likely due to pancreatic                            insufficiency interfered [name], MD 02/26/2019 4:57:05 PM This report has been signed electronically.

## 2019-02-26 NOTE — Op Note (Signed)
[place] Patient Name: Christine [name] Procedure Date: 02/26/2019 3:52 PM MRN: [id number] Endoscopist: [name] , MD Age: 48 Referring MD:  Date of Birth: 08-Nov-1970 Gender: Female Account #: [id number] Procedure:                Upper GI endoscopy Indications:              Diarrhea, Weight loss Medicines:                Propofol per Anesthesia, Monitored Anesthesia Care Procedure:                Pre-Anesthesia Assessment:                           - Prior to the procedure, a History and Physical                            was performed, and patient medications and                            allergies were reviewed. The patient's tolerance of                            previous anesthesia was also reviewed. The risks                            and benefits of the procedure and the sedation                            options and risks were discussed with the patient.                            All questions were answered, and informed consent                            was obtained. Prior Anticoagulants: The patient has                            taken no previous anticoagulant or antiplatelet                            agents. ASA Grade Assessment: III - A patient with                            severe systemic disease. After reviewing the risks                            and benefits, the patient was deemed in                            satisfactory condition to undergo the procedure.                           After obtaining informed consent, the endoscope was  passed under direct vision. Throughout the                            procedure, the patient's blood pressure, pulse, and                            oxygen saturations were monitored continuously. The                            Endoscope was introduced through the mouth, and                            advanced to the second part of duodenum. The upper                            GI  endoscopy was accomplished without difficulty.                            The patient tolerated the procedure well. Scope In: Scope Out: Findings:                 Esophagitis was found in the entire esophagus.                            Biopsies were taken with a cold forceps for                            histology. Estimated blood loss was minimal.                           Patchy moderate inflammation characterized by                            adherent blood, erosions and friability was found                            in the gastric antrum. Biopsies were taken with a                            cold forceps for histology. Verification of patient                            identification for the specimen was done. Estimated                            blood loss was minimal.                           A promnent mucosa, enlarged deformity was found at                            the major papilla. Biopsies were taken with a cold  forceps for histology. Verification of patient                            identification for the specimen was done. Estimated                            blood loss was minimal.                           The exam was otherwise without abnormality.                           The cardia and gastric fundus were normal on                            retroflexion. Complications:            No immediate complications. Estimated Blood Loss:     Estimated blood loss was minimal. Impression:               - Candidiasis esophagitis. Biopsied.                           - Gastritis. Biopsied.                           - Duodenal deformity. Biopsied.                           - The examination was otherwise normal. Recommendation:           - Patient has a contact number available for                            emergencies. The signs and symptoms of potential                            delayed complications were discussed with the                             patient. Return to normal activities tomorrow.                            Written discharge instructions were provided to the                            patient.                           - Resume previous diet.                           - Continue present medications.                           - trial of Creon 36k 2 w/ meals she has chronic  pancreatitis by CT [name], MD 02/26/2019 4:53:56 PM This report has been signed electronically.

## 2019-02-26 NOTE — Patient Instructions (Addendum)
Findings:  Looks like a yeast infection in the esophagus - if so will need to set up an HIV test later. I will let you know.  The stomach is inflamed and the area where the pancreas empties into the intestine looked enlarged. I took biopsies.   The colon lining looks ok though there was still sticky stool present and I could not see very well.  I do not think you have cancer.  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  You must stop drinking or you will only get worse.  I am providing samples of a medication called Creon to take with meals and snacks to provide what your pancreas cannot do to digest and absorb food and nutrients.  Will be in touch.  I appreciate the opportunity to care for you. [name], MD, FACG  YOU HAD AN ENDOSCOPIC PROCEDURE TODAY AT [facility]:   Refer to the procedure report that was given to you for any specific questions about what was found during the examination.  If the procedure report does not answer your questions, please call your gastroenterologist to clarify.  If you requested that your care partner not be given the details of your procedure findings, then the procedure report has been included in a sealed envelope for you to review at your convenience later.  YOU SHOULD EXPECT: Some feelings of bloating in the abdomen. Passage of more gas than usual.  Walking can help get rid of the air that was put into your GI tract during the procedure and reduce the bloating. If you had a lower endoscopy (such as a colonoscopy or flexible sigmoidoscopy) you may notice spotting of blood in your stool or on the toilet paper. If you underwent a bowel prep for your procedure, you may not have a normal bowel movement for a few days.  Please Note:  You might notice some irritation and congestion in your nose or some drainage.  This is from the oxygen used during your procedure.  There is no need for concern and it should clear  up in a day or so.  SYMPTOMS TO REPORT IMMEDIATELY:   Following lower endoscopy (colonoscopy or flexible sigmoidoscopy):  Excessive amounts of blood in the stool  Significant tenderness or worsening of abdominal pains  Swelling of the abdomen that is new, acute  Fever of 100F or higher   Following upper endoscopy (EGD)  Vomiting of blood or coffee ground material  New chest pain or pain under the shoulder blades  Painful or persistently difficult swallowing  New shortness of breath  Fever of 100F or higher  Black, tarry-looking stools  For urgent or emergent issues, a gastroenterologist can be reached at any hour by calling [phone].   DIET:  We do recommend a small meal at first, but then you may proceed to your regular diet.  Drink plenty of fluids but you should avoid alcoholic beverages for 24 hours.  ACTIVITY:  You should plan to take it easy for the rest of today and you should NOT DRIVE or use heavy machinery until tomorrow (because of the sedation medicines used during the test).    FOLLOW UP: Our staff will call the number listed on your records 48-72 hours following your procedure to check on you and address any questions or concerns that you may have regarding the information given to you following your procedure. If we do not reach you, we will leave a message.  We will attempt to reach you two times.  During this call, we will ask if you have developed any symptoms of COVID 19. If you develop any symptoms (ie: fever, flu-like symptoms, shortness of breath, cough etc.) before then, please call [phone].  If you test positive for Covid 19 in the 2 weeks post procedure, please call and report this information to [place].    If any biopsies were taken you will be contacted by phone or by letter within the next 1-3 weeks.  Please call us at [phone] if you have not heard about the biopsies in 3 weeks.    SIGNATURES/CONFIDENTIALITY: You and/or your care partner  have signed paperwork which will be entered into your electronic medical record.  These signatures attest to the fact that that the information above on your After Visit Summary has been reviewed and is understood.  Full responsibility of the confidentiality of this discharge information lies with you and/or your care-partner.

## 2019-02-26 NOTE — Progress Notes (Signed)
Temp JB V/S CW  pt has not had a menstrual cycle in the past 3-4 months. She is sexually active and not on birth control. She says she could be pregnant but has not taken a pregnancy test. Dr. Carlean Purl notified. BHCG being drawn . Pt will was informed her procedure will be delayed til results.

## 2019-03-02 ENCOUNTER — Telehealth: Payer: Self-pay

## 2019-03-02 NOTE — Telephone Encounter (Signed)
  Follow up Call-  Call back number 02/26/2019  Post procedure Call Back phone  # [phone]  Permission to leave phone message Yes  Some recent data might be hidden     Patient questions:  Do you have a fever, pain , or abdominal swelling? No. Pain Score  0 *  Have you tolerated food without any problems? Yes.    Have you been able to return to your normal activities? Yes.    Do you have any questions about your discharge instructions: Diet   No. Medications  No. Follow up visit  No.  Do you have questions or concerns about your Care? No.  Actions: * If pain score is 4 or above: No action needed, pain <4.  1. Have you developed a fever since your procedure? No   2.   Have you had an respiratory symptoms (SOB or cough) since your procedure? no  3.   Have you tested positive for COVID 19 since your procedure no  4.   Have you had any family members/close contacts diagnosed with the COVID 19 since your procedure?  no   If yes to any of these questions please route to Joylene John, RN and Alphonsa Gin, Therapist, sports.

## 2019-03-12 ENCOUNTER — Other Ambulatory Visit: Payer: Self-pay | Admitting: Internal Medicine

## 2019-03-12 ENCOUNTER — Encounter: Payer: Self-pay | Admitting: Internal Medicine

## 2019-03-12 DIAGNOSIS — B3781 Candidal esophagitis: Secondary | ICD-10-CM

## 2019-03-12 HISTORY — DX: Candidal esophagitis: B37.81

## 2019-03-12 MED ORDER — FLUCONAZOLE 100 MG PO TABS: 100.0000 mg | ORAL_TABLET | Freq: Every day | ORAL | 0 refills | Status: DC

## 2019-03-12 MED ORDER — OMEPRAZOLE 40 MG PO CPDR: 40.0000 mg | DELAYED_RELEASE_CAPSULE | Freq: Every day | ORAL | 1 refills | Status: DC

## 2019-03-12 NOTE — Progress Notes (Signed)
Christine [name],  I left a phone message with your mom - you have an infection in the esophagus called Candida. I sent a prescription to pharmacy to treat that - called fluconazole. Also put you on an acid blocker to heal inflammation in the intestine - omeprazole.  Please call and arrange an appointment to see [place] in follow-up - you can ask to see me or Ellouise Newer PA-C .  Need to adjust the dose of the Creon to help the diarrhea.  Though I think prednisone and diabetes are causing the esophagitis I think you need an HIV test as that can lead to this problem also.  Please come to the lab and have that test run - you can come to the basement of my building any day of the regular week and get that test.  I appreciate the opportunity to care for you. [name], MD, [facility]

## 2019-03-15 ENCOUNTER — Other Ambulatory Visit: Payer: Self-pay

## 2019-03-15 ENCOUNTER — Encounter: Payer: Self-pay | Admitting: Family Medicine

## 2019-03-15 ENCOUNTER — Ambulatory Visit (INDEPENDENT_AMBULATORY_CARE_PROVIDER_SITE_OTHER): Payer: Self-pay | Admitting: Family Medicine

## 2019-03-15 VITALS — BP 144/78 | HR 84 | Wt 134.0 lb

## 2019-03-15 DIAGNOSIS — R159 Full incontinence of feces: Secondary | ICD-10-CM

## 2019-03-15 DIAGNOSIS — Z794 Long term (current) use of insulin: Secondary | ICD-10-CM

## 2019-03-15 DIAGNOSIS — E1140 Type 2 diabetes mellitus with diabetic neuropathy, unspecified: Secondary | ICD-10-CM

## 2019-03-15 DIAGNOSIS — B3781 Candidal esophagitis: Secondary | ICD-10-CM

## 2019-03-15 DIAGNOSIS — R634 Abnormal weight loss: Secondary | ICD-10-CM

## 2019-03-15 LAB — POCT GLYCOSYLATED HEMOGLOBIN (HGB A1C): HbA1c, POC (controlled diabetic range): 7 % (ref 0.0–7.0)

## 2019-03-15 MED ORDER — OMEPRAZOLE 40 MG PO CPDR: 40.0000 mg | DELAYED_RELEASE_CAPSULE | Freq: Every day | ORAL | 1 refills | Status: DC

## 2019-03-15 MED ORDER — FLUCONAZOLE 100 MG PO TABS: 100.0000 mg | ORAL_TABLET | Freq: Every day | ORAL | 0 refills | Status: DC

## 2019-03-15 NOTE — Progress Notes (Signed)
Subjective:   Patient ID: Christine [name]    DOB: [birth year], 48 y.o. female   MRN: [id number]  Christine [name] is a 48 y.o. female with a history of HTN, venous insufficiency, chronic alcoholic pancreatitis, H/O esophageal candidiasis, T2DM with neuropathy, H/o drug abuse, tobacco use, HLD, anemia, depression, severe protein calorie malnutrition here for   Fecal incontinence, weight loss - 169lb > 132lb in 2 years, unintentional - previously seen for same with few months history of intermittent fecal incontinence with episodes of normal BMs in between. -Positive FOBT in ED 01/26/2019 with small external nonbleeding hemorrhoid on exam. - Hemoglobin low but stable at 10.1 with a normal anemia panel 01/2019.   - Referred to GI with colonoscopy and endoscopy 02/26/2019, notable for inflamed hemorrhoids, candidiasis esophagitis throughout the entire esophagus, gastric antrum erosions and friability, and duodenal deformity with biopsies taken of all.  Was started on a Creon trial for chronic pancreatitis and Diflucan and Prilosec. It was thought prednisone and diabetes is leading to esophagitis.  - states symptoms have improved since starting creon. Was not able to fill omeprazole or diflucan due to the expense.  - has not been taking NSAIDs - eating about 2 meals per day. - denies N/V, fevers - stools have improved, has about 5-6 stools per day with normal BMs in between loose ones. - no FH of cancer, no lumps or bumps in breast.  Review of Systems:  Per HPI.  Medications and smoking status reviewed.  Objective:   BP (!) 144/78   Pulse 84   Wt 134 lb (60.8 kg)   LMP 11/12/2018   SpO2 98%   BMI 19.79 kg/m  Vitals and nursing note reviewed.  General: thin, gaunt female, in no acute distress with non-toxic appearance CV: regular rate and rhythm without murmurs, rubs, or gallops, no lower extremity edema Lungs: clear to auscultation bilaterally with normal work of breathing Abdomen: soft,  non-tender, non-distended, no masses or organomegaly palpable, normoactive bowel sounds  Assessment & Plan:   Abnormal weight loss Likely GI etiology given symptoms and abnormal endoscopy. Will obtain HIV and CMP. Previously normal TSH. UTD on age-appropriate cancer screenings.   Fecal incontinence Symptoms improving since starting creon suggesting contribution from chronic pancreatitis. Another Rx of diflucan and omeprazole sent to different pharmacy 2/2 cost. Will also obtain HIV and CMP given weight loss and candidiasis esophagitis. Advised patient to refrain from NSAIDs. Also discussed possible utility of keeping a food diary. Still awaiting final GI biopsy result. F/u in one month or sooner if symptoms worsen.   Orders Placed This Encounter  Procedures  . CMP (comprehensive metabolic panel)    Standing Status:   Future    Standing Expiration Date:   03/14/2020    Order Specific Question:   Has the patient fasted?    Answer:   No  . HIV antibody (with reflex)    Standing Status:   Future    Standing Expiration Date:   03/14/2020  . HgB A1c   Meds ordered this encounter  Medications  . fluconazole (DIFLUCAN) 100 MG tablet    Sig: Take 1 tablet (100 mg total) by mouth daily. Take 200 mg day 1    Dispense:  22 tablet    Refill:  0  . omeprazole (PRILOSEC) 40 MG capsule    Sig: Take 1 capsule (40 mg total) by mouth daily before breakfast.    Dispense:  90 capsule    Refill:  1  Rory Percy, DO PGY-3, [facility] 03/15/2019 7:33 PM

## 2019-03-15 NOTE — Assessment & Plan Note (Addendum)
Symptoms improving since starting creon suggesting contribution from chronic pancreatitis. Another Rx of diflucan and omeprazole sent to different pharmacy 2/2 cost. Will also obtain HIV and CMP given weight loss and candidiasis esophagitis. Advised patient to refrain from NSAIDs. Also discussed possible utility of keeping a food diary. Still awaiting final GI biopsy result. F/u in one month or sooner if symptoms worsen.

## 2019-03-15 NOTE — Patient Instructions (Signed)
It was great to see you!  Our plans for today:  - Come back for labs this week. - I [facility] your medications to [facility], use the coupons to get a lower price. - It may be helpful to keep a food diary to see if there are any foods that upset your stomach or make your diarrhea worse. - Call the GI doctor to see when they want you to follow up. - See your primary doctor to follow up on your diabetes and skin referral. - Follow up for your weight and diarrhea in one month.  Take care and seek immediate care sooner if you develop any concerns.   Dr. [facility]

## 2019-03-15 NOTE — Assessment & Plan Note (Signed)
Likely GI etiology given symptoms and abnormal endoscopy. Will obtain HIV and CMP. Previously normal TSH. UTD on age-appropriate cancer screenings.

## 2019-03-19 ENCOUNTER — Other Ambulatory Visit: Payer: Self-pay

## 2019-03-19 ENCOUNTER — Other Ambulatory Visit: Payer: Medicaid Other

## 2019-03-19 DIAGNOSIS — B3781 Candidal esophagitis: Secondary | ICD-10-CM

## 2019-03-19 DIAGNOSIS — R634 Abnormal weight loss: Secondary | ICD-10-CM

## 2019-03-20 LAB — COMPREHENSIVE METABOLIC PANEL
ALT: 31 IU/L (ref 0–32)
AST: 45 IU/L — ABNORMAL HIGH (ref 0–40)
Albumin/Globulin Ratio: 1.4 (ref 1.2–2.2)
Albumin: 3.7 g/dL — ABNORMAL LOW (ref 3.8–4.8)
Alkaline Phosphatase: 109 IU/L (ref 39–117)
BUN/Creatinine Ratio: 8 — ABNORMAL LOW (ref 9–23)
BUN: 10 mg/dL (ref 6–24)
Bilirubin Total: <0.2 mg/dL (ref 0.0–1.2)
CO2: 17 mmol/L — ABNORMAL LOW (ref 20–29)
Calcium: 8.8 mg/dL (ref 8.7–10.2)
Chloride: 105 mmol/L (ref 96–106)
Creatinine, Ser: 1.27 mg/dL — ABNORMAL HIGH (ref 0.57–1.00)
GFR calc Af Amer: 58 mL/min/{1.73_m2} — ABNORMAL LOW (ref >59)
GFR calc non Af Amer: 50 mL/min/{1.73_m2} — ABNORMAL LOW (ref >59)
Globulin, Total: 2.7 g/dL (ref 1.5–4.5)
Glucose: 414 mg/dL — ABNORMAL HIGH (ref 65–99)
Potassium: 5.7 mmol/L — ABNORMAL HIGH (ref 3.5–5.2)
Sodium: 136 mmol/L (ref 134–144)
Total Protein: 6.4 g/dL (ref 6.0–8.5)

## 2019-03-20 LAB — HIV ANTIBODY (ROUTINE TESTING W REFLEX): HIV Screen 4th Generation wRfx: NONREACTIVE

## 2019-03-22 ENCOUNTER — Other Ambulatory Visit: Payer: Self-pay | Admitting: Family Medicine

## 2019-03-22 ENCOUNTER — Other Ambulatory Visit: Payer: Self-pay

## 2019-03-22 ENCOUNTER — Other Ambulatory Visit: Payer: Medicaid Other

## 2019-03-22 DIAGNOSIS — E1140 Type 2 diabetes mellitus with diabetic neuropathy, unspecified: Secondary | ICD-10-CM

## 2019-03-22 MED ORDER — RELION PRIME MONITOR DEVI: 1.0000 | Freq: Three times a day (TID) | 0 refills | Status: DC

## 2019-03-22 MED ORDER — GLUCOSE BLOOD VI STRP: ORAL_STRIP | 12 refills | Status: DC

## 2019-03-22 MED ORDER — RELION LANCETS MICRO-THIN 33G MISC: 1.0000 | Freq: Three times a day (TID) | 11 refills | Status: DC

## 2019-03-22 NOTE — Progress Notes (Signed)
Pharmacy is asking for specific directions on test strips.  [name],CMA

## 2019-03-22 NOTE — Addendum Note (Signed)
Addended by: Valerie Roys on: 03/22/2019 12:05 PM   Modules accepted: Orders

## 2019-03-22 NOTE — Addendum Note (Signed)
Addended by: Myles Gip on: 03/22/2019 08:57 AM   Modules accepted: Orders

## 2019-03-23 LAB — BASIC METABOLIC PANEL
BUN/Creatinine Ratio: 16 (ref 9–23)
BUN: 20 mg/dL (ref 6–24)
CO2: 23 mmol/L (ref 20–29)
Calcium: 9 mg/dL (ref 8.7–10.2)
Chloride: 101 mmol/L (ref 96–106)
Creatinine, Ser: 1.25 mg/dL — ABNORMAL HIGH (ref 0.57–1.00)
GFR calc Af Amer: 59 mL/min/{1.73_m2} — ABNORMAL LOW (ref >59)
GFR calc non Af Amer: 51 mL/min/{1.73_m2} — ABNORMAL LOW (ref >59)
Glucose: 224 mg/dL — ABNORMAL HIGH (ref 65–99)
Potassium: 4.7 mmol/L (ref 3.5–5.2)
Sodium: 139 mmol/L (ref 134–144)

## 2019-03-24 ENCOUNTER — Encounter: Payer: Self-pay | Admitting: *Deleted

## 2019-03-29 ENCOUNTER — Other Ambulatory Visit: Payer: Self-pay

## 2019-03-29 DIAGNOSIS — Z20822 Contact with and (suspected) exposure to covid-19: Secondary | ICD-10-CM

## 2019-03-30 LAB — NOVEL CORONAVIRUS, NAA: SARS-CoV-2, NAA: NOT DETECTED

## 2019-03-31 ENCOUNTER — Telehealth: Payer: Self-pay

## 2019-03-31 ENCOUNTER — Other Ambulatory Visit: Payer: Self-pay | Admitting: *Deleted

## 2019-03-31 NOTE — Telephone Encounter (Signed)
Patient given negative result and verbalized understanding  

## 2019-04-02 MED ORDER — PREDNISONE 5 MG PO TABS: 10.0000 mg | ORAL_TABLET | Freq: Every day | ORAL | 0 refills | Status: DC

## 2019-04-12 ENCOUNTER — Other Ambulatory Visit: Payer: Self-pay

## 2019-04-12 DIAGNOSIS — Z20822 Contact with and (suspected) exposure to covid-19: Secondary | ICD-10-CM

## 2019-04-14 LAB — NOVEL CORONAVIRUS, NAA: SARS-CoV-2, NAA: NOT DETECTED

## 2019-04-29 ENCOUNTER — Ambulatory Visit: Payer: Medicaid Other | Attending: Internal Medicine

## 2019-04-29 DIAGNOSIS — Z20822 Contact with and (suspected) exposure to covid-19: Secondary | ICD-10-CM

## 2019-05-01 LAB — NOVEL CORONAVIRUS, NAA: SARS-CoV-2, NAA: NOT DETECTED

## 2019-05-04 ENCOUNTER — Other Ambulatory Visit: Payer: Self-pay | Admitting: Family Medicine

## 2019-05-07 ENCOUNTER — Other Ambulatory Visit: Payer: Medicaid Other

## 2019-05-10 ENCOUNTER — Ambulatory Visit: Payer: Medicaid Other | Attending: Internal Medicine

## 2019-05-10 ENCOUNTER — Telehealth (INDEPENDENT_AMBULATORY_CARE_PROVIDER_SITE_OTHER): Payer: Self-pay | Admitting: Family Medicine

## 2019-05-10 ENCOUNTER — Other Ambulatory Visit: Payer: Self-pay

## 2019-05-10 DIAGNOSIS — Z7952 Long term (current) use of systemic steroids: Secondary | ICD-10-CM

## 2019-05-10 DIAGNOSIS — Z20822 Contact with and (suspected) exposure to covid-19: Secondary | ICD-10-CM

## 2019-05-10 DIAGNOSIS — L259 Unspecified contact dermatitis, unspecified cause: Secondary | ICD-10-CM

## 2019-05-10 MED ORDER — METFORMIN HCL 1000 MG PO TABS: ORAL_TABLET | ORAL | 5 refills | Status: DC

## 2019-05-10 MED ORDER — LOSARTAN POTASSIUM-HCTZ 50-12.5 MG PO TABS: 1.0000 | ORAL_TABLET | Freq: Every day | ORAL | 1 refills | Status: DC

## 2019-05-10 MED ORDER — GABAPENTIN 300 MG PO CAPS: ORAL_CAPSULE | ORAL | 1 refills | Status: DC

## 2019-05-10 MED ORDER — PREDNISONE 10 MG PO TABS: 10.0000 mg | ORAL_TABLET | ORAL | 0 refills | Status: DC

## 2019-05-10 NOTE — Progress Notes (Signed)
[place] Telemedicine Visit  Patient consented to have virtual visit. Method of visit: Video was attempted, but technology challenges prevented patient from using video, so visit was conducted via telephone.  Encounter participants: Patient: Christine [name] - located at home Provider: [place] Alyric Parkin - located at [facility]  Others (if applicable): n/a  Chief Complaint: refill of meds  HPI:  Long-term prednisone use Received refill request for patient to continue with her prednisone.  I have not discussed this with patient but I do not see any indication for her to be on long-term prednisone.  She is calling today for follow-up for this refill.  Patient states that she was started on prednisone for a rash that she had on her legs that were quite bumpy and had pus coming out of them.  She stated that the prednisone helped.  She has never been evaluated by dermatology for this rash.  Patient was started on the prednisone in 11/2016 prior prior physician and was to be tapered off but never was able to completely taper off.  She is now taking 10 mg daily of the prednisone and has since 2018.  Patient with a recent EGD which showed esophageal candidiasis and she has also had a recent weight loss.  Given the risk of patient being on long-term prednisone did discuss tapering her off of this.  Patient states that she understands that it is not good to be on long-term and she is also concerned about her recent weight loss.  Patient also has diabetes that is much more difficult to control with her prednisone.  ROS: per HPI  Pertinent PMHx: history of HTN, venous insufficiency, chronic alcoholic pancreatitis, H/O esophageal candidiasis, T2DM with neuropathy, H/o drug abuse, tobacco use, HLD, anemia, depression, severe protein calorie malnutrition  Exam:  Respiratory: able to speak in complete sentences without issue  Assessment/Plan:  Long term (current) use of systemic  steroids Patient does not have indication for long-term steroids and given the risk of being on this medication we will begin taper.  Patient to start taking 10 mg every other day for 1 month.  She is then to call and follow-up.  Discussed strict return precautions including development of any flulike symptoms.  Will forward to pharmacy in order to help me formulate a long-term plan for patient to taper off of this prednisone and she has trouble affording medications and has been on 10 mg since 2018. -Steroids were initially started for patient having a rash on her legs.  Placed another referral for dermatology encourage patient for follow-up.  However patient's rash not as severe as her long-term use risk of being on systemic steroids. -Patient also recently diagnosed with esophageal candidiasis and has completed her treatment for her candidiasis and this could have been contributing to patient's weight loss.  We will continue to monitor this and will follow up with patient closely.  She is also currently continuing to follow with GI.    Time spent during visit with patient: 20 minutes  [place] Prestyn Mahn, DO PGY-3, [place]

## 2019-05-11 LAB — NOVEL CORONAVIRUS, NAA: SARS-CoV-2, NAA: NOT DETECTED

## 2019-05-12 ENCOUNTER — Telehealth: Payer: Self-pay | Admitting: *Deleted

## 2019-05-12 DIAGNOSIS — Z7952 Long term (current) use of systemic steroids: Secondary | ICD-10-CM

## 2019-05-12 HISTORY — DX: Long term (current) use of systemic steroids: Z79.52

## 2019-05-12 NOTE — Telephone Encounter (Signed)
Pharmacy request came for lisinopril-hctz 20-12.mg.  This not currently on patient's active medication list. Zuleima Haser,CMA

## 2019-05-12 NOTE — Assessment & Plan Note (Signed)
Patient does not have indication for long-term steroids and given the risk of being on this medication we will begin taper.  Patient to start taking 10 mg every other day for 1 month.  She is then to call and follow-up.  Discussed strict return precautions including development of any flulike symptoms.  Will forward to pharmacy in order to help me formulate a long-term plan for patient to taper off of this prednisone and she has trouble affording medications and has been on 10 mg since 2018. -Steroids were initially started for patient having a rash on her legs.  Placed another referral for dermatology encourage patient for follow-up.  However patient's rash not as severe as her long-term use risk of being on systemic steroids. -Patient also recently diagnosed with esophageal candidiasis and has completed her treatment for her candidiasis and this could have been contributing to patient's weight loss.  We will continue to monitor this and will follow up with patient closely.  She is also currently continuing to follow with GI.

## 2019-05-13 NOTE — Progress Notes (Signed)
Discussed with Dr. Enid Derry.  She has been on supraphysiologic doses of steroids for two years.  Her axis is fully suppressed.  I discussed a longer taper with her - one that tapers over a full year.

## 2019-05-13 NOTE — Telephone Encounter (Signed)
Called pharmacy and clarified the patient is on losartan-HCTZ rather than lisinopril.  They will take off his lisinopril-hctz from the med list.

## 2019-05-13 NOTE — Progress Notes (Signed)
Thank you so much for your help and guidance.

## 2019-05-13 NOTE — Progress Notes (Signed)
Steroid Tapers (I am including Dr. Andria Frames for more potential historical input).  We do this much less often than in the past.   No strong/perfect science to this but SLOW is important  My suggestion 10mg  QOD X 1 month (you have started this) 7.5mg  QOD X 1 month 5mg  QOD for 1 month  Then Trial OFF  We may need to use 2.5mg  QOD for a month W/D Sx  Please share with her that any of the following may happen and we could reevaluate taper if these are moderate/severe. Severe fatigue Weakness Body aches Joint pain Nausea Loss of appetite Lightheadedness

## 2019-06-14 ENCOUNTER — Encounter (HOSPITAL_COMMUNITY): Payer: Self-pay | Admitting: Emergency Medicine

## 2019-06-14 ENCOUNTER — Other Ambulatory Visit: Payer: Self-pay

## 2019-06-14 ENCOUNTER — Emergency Department (HOSPITAL_COMMUNITY)
Admission: EM | Admit: 2019-06-14 | Discharge: 2019-06-14 | Disposition: A | Discharge status: Home / Self Care | Payer: Self-pay | Attending: Emergency Medicine | Admitting: Emergency Medicine

## 2019-06-14 ENCOUNTER — Emergency Department (HOSPITAL_COMMUNITY): Payer: Self-pay

## 2019-06-14 DIAGNOSIS — Z79899 Other long term (current) drug therapy: Secondary | ICD-10-CM | POA: Insufficient documentation

## 2019-06-14 DIAGNOSIS — E119 Type 2 diabetes mellitus without complications: Secondary | ICD-10-CM | POA: Insufficient documentation

## 2019-06-14 DIAGNOSIS — Z7984 Long term (current) use of oral hypoglycemic drugs: Secondary | ICD-10-CM | POA: Insufficient documentation

## 2019-06-14 DIAGNOSIS — R519 Headache, unspecified: Secondary | ICD-10-CM | POA: Insufficient documentation

## 2019-06-14 DIAGNOSIS — I10 Essential (primary) hypertension: Secondary | ICD-10-CM | POA: Insufficient documentation

## 2019-06-14 DIAGNOSIS — R42 Dizziness and giddiness: Secondary | ICD-10-CM | POA: Insufficient documentation

## 2019-06-14 LAB — CBC WITH DIFFERENTIAL/PLATELET
Abs Immature Granulocytes: 0 10*3/uL (ref 0.00–0.07)
Basophils Absolute: 0 10*3/uL (ref 0.0–0.1)
Basophils Relative: 1 %
Eosinophils Absolute: 0 10*3/uL (ref 0.0–0.5)
Eosinophils Relative: 1 %
HCT: 29.5 % — ABNORMAL LOW (ref 36.0–46.0)
Hemoglobin: 9.8 g/dL — ABNORMAL LOW (ref 12.0–15.0)
Immature Granulocytes: 0 %
Lymphocytes Relative: 31 %
Lymphs Abs: 1.2 10*3/uL (ref 0.7–4.0)
MCH: 32 pg (ref 26.0–34.0)
MCHC: 33.2 g/dL (ref 30.0–36.0)
MCV: 96.4 fL (ref 80.0–100.0)
Monocytes Absolute: 0.4 10*3/uL (ref 0.1–1.0)
Monocytes Relative: 9 %
Neutro Abs: 2.3 10*3/uL (ref 1.7–7.7)
Neutrophils Relative %: 58 %
Platelets: 205 10*3/uL (ref 150–400)
RBC: 3.06 MIL/uL — ABNORMAL LOW (ref 3.87–5.11)
RDW: 15 % (ref 11.5–15.5)
WBC: 3.9 10*3/uL — ABNORMAL LOW (ref 4.0–10.5)
nRBC: 0 % (ref 0.0–0.2)

## 2019-06-14 LAB — BASIC METABOLIC PANEL
Anion gap: 14 (ref 5–15)
BUN: 13 mg/dL (ref 6–20)
CO2: 23 mmol/L (ref 22–32)
Calcium: 8.5 mg/dL — ABNORMAL LOW (ref 8.9–10.3)
Chloride: 97 mmol/L — ABNORMAL LOW (ref 98–111)
Creatinine, Ser: 1.4 mg/dL — ABNORMAL HIGH (ref 0.44–1.00)
GFR calc Af Amer: 51 mL/min — ABNORMAL LOW (ref >60)
GFR calc non Af Amer: 44 mL/min — ABNORMAL LOW (ref >60)
Glucose, Bld: 494 mg/dL — ABNORMAL HIGH (ref 70–99)
Potassium: 3.8 mmol/L (ref 3.5–5.1)
Sodium: 134 mmol/L — ABNORMAL LOW (ref 135–145)

## 2019-06-14 LAB — CBG MONITORING, ED: Glucose-Capillary: 462 mg/dL — ABNORMAL HIGH (ref 70–99)

## 2019-06-14 MED ORDER — ACETAMINOPHEN 500 MG PO TABS
500.0000 mg | ORAL_TABLET | Freq: Once | ORAL | Status: AC
  Administered 2019-06-14: 500 mg via ORAL
  Filled 2019-06-14: qty 1

## 2019-06-14 MED ORDER — DIPHENHYDRAMINE HCL 50 MG/ML IJ SOLN
12.5000 mg | Freq: Once | INTRAMUSCULAR | Status: AC
  Administered 2019-06-14: 12.5 mg via INTRAVENOUS
  Filled 2019-06-14: qty 1

## 2019-06-14 MED ORDER — PROCHLORPERAZINE EDISYLATE 10 MG/2ML IJ SOLN
5.0000 mg | Freq: Once | INTRAMUSCULAR | Status: AC
  Administered 2019-06-14: 5 mg via INTRAVENOUS
  Filled 2019-06-14: qty 2

## 2019-06-14 NOTE — Discharge Instructions (Addendum)
Please call your primary doctor regarding your headaches today.  Recommend Tylenol as needed.  Return to ER if your headache becomes severe, you develop any fever, any neck stiffness or other new concerning symptom.  Regarding the CT scan of your brain, there were no major strokes or bleeding noticed.  However, there were some subtle signs that the radiologist noted and recommended obtaining a MRI to further evaluate.  Please discuss with your primary doctor.

## 2019-06-14 NOTE — ED Provider Notes (Signed)
[place] EMERGENCY DEPARTMENT Provider Note   CSN: [id number] Arrival date & time: 06/14/19  1001     History Chief Complaint  Patient presents with  . Dizziness  . Headache    Christine [name] is a 49 y.o. female.  Presents to ER with headache.  Patient states while she was at work today she suddenly had a headache, felt somewhat lightheaded.  Headache initially moderate in severity, then became more severe and now is back to moderate.  Frontal, sharp, stabbing.  Started around 9 AM or so.  Has history of headaches in past, similar to prior.  No associated numbness, weakness, vision changes, no eye pain.  No fevers or neck stiffness.  No cough, difficulty in breathing.  HPI     Past Medical History:  Diagnosis Date  . Alcohol abuse   . Cellulitis 11/2016   RIGHT ARM  . Chronic pancreatitis ([place])   . Diabetes mellitus   . Esophageal candidiasis ([place]) 03/12/2019  . Hypertension   . Substance abuse [facility])     Patient Active Problem List   Diagnosis Date Noted  . Long term (current) use of systemic steroids 05/12/2019  . Esophageal candidiasis ([place]) 03/12/2019  . Fecal incontinence 12/14/2018  . Fatigue 12/14/2018  . Chronic cough 07/28/2018  . Syncope 05/14/2018  . Alcohol use 03/30/2018  . Abnormal weight loss 01/13/2017  . Chronic contact dermatitis 11/19/2016  . 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 Vitamin D deficiency 11/01/2013  . Venous insufficiency 02/05/2013  . Essential hypertension, benign 12/25/2012  . Chronic alcoholic  pancreatitis 0000000  . Protein-calorie malnutrition, severe ([place]) 11/18/2012  . Depression 09/29/2012  . Neuropathic pain 01/29/2011  . DRUG ABUSE, HX OF 07/05/2009  . ANEMIA 09/28/2008  . HYPERLIPIDEMIA 09/12/2008  . Tobacco use disorder 09/12/2008  . Diabetes mellitus with neuropathy ([place]) 06/26/2006    Past Surgical History:  Procedure Laterality Date  . BREAST REDUCTION  SURGERY       OB History   No obstetric history on file.     No family history on file.  Social History   Tobacco Use  . Smoking status: Current Every Day Smoker    Packs/day: 0.50    Years: 20.00    Pack years: 10.00    Types: Cigarettes  . Smokeless tobacco: Never Used  . Tobacco comment: working on quitting  Substance Use Topics  . Alcohol use: Yes    Alcohol/week: 0.0 standard drinks    Comment: 11/2016 " i DRINK LESS THAN i USED TO,BUT i DRINK BEER EVERYDAY  . Drug use: Yes    Types: Marijuana    Comment: last use two weeks ago    Home Medications Prior to Admission medications   Medication Sig Start Date End Date Taking? Authorizing Provider  gabapentin (NEURONTIN) 300 MG capsule TAKE 1 CAPSULE BY MOUTH THREE TIMES A DAY Patient taking differently: Take 300 mg by mouth 3 (three) times daily. TAKE 1 CAPSULE BY MOUTH THREE TIMES A DAY 05/10/19  Yes Shirley, [place], DO  lipase/protease/amylase (CREON) 36000 UNITS CPEP capsule Take 2 with meals and 1 with snacks Samples of this drug were given to the patient, quantity 96, Lot Number [id number] 02/26/19  Yes [name], MD  losartan-hydrochlorothiazide (HYZAAR) 50-12.5 MG tablet Take 1 tablet by mouth daily. 05/10/19  Yes Enid Derry, [place], DO  metFORMIN (GLUCOPHAGE) 1000 MG tablet TAKE 1 TABLET BY MOUTH EVERY DAY WITH BREAKFAST Patient taking differently: Take 1,000 mg by mouth  daily with breakfast. TAKE 1 TABLET BY MOUTH EVERY DAY WITH BREAKFAST 05/10/19  Yes Enid Derry, [place], DO  omeprazole (PRILOSEC) 40 MG capsule Take 1 capsule (40 mg total) by mouth daily before breakfast. 03/15/19  Yes Rumball, Bryson Ha, DO  polyethylene glycol (MIRALAX / GLYCOLAX) packet Take 17 g by mouth daily as needed for mild constipation. 12/11/16  Yes Bonnita Hollow, MD  predniSONE (DELTASONE) 10 MG tablet Take 1 tablet (10 mg total) by mouth every other day. Please take 1 tablet with breakfast every other day. 05/10/19  Yes Shirley, [place], DO   acetaminophen (TYLENOL) 325 MG tablet Take 2 tablets (650 mg total) by mouth every 6 (six) hours as needed for mild pain (or Fever >/= 101). Patient not taking: Reported on 02/26/2019 12/11/16   Bonnita Hollow, MD  Aspirin-Acetaminophen-Caffeine (GOODY HEADACHE PO) Take by mouth as needed.    [provider]  Blood Glucose Monitoring Suppl (RELION PRIME MONITOR) DEVI 1 each by Does not apply route 3 (three) times daily. 03/22/19   Rory Percy, DO  glucose blood test strip Use as instructed Patient not taking: Reported on 06/14/2019 03/22/19   Rory Percy, DO  predniSONE (DELTASONE) 5 MG tablet Take 2 tablets (10 mg total) by mouth daily with breakfast. 04/02/19   Shirley, [place], DO  ReliOn Lancets Micro-Thin 33G MISC 1 each by Does not apply route 3 (three) times daily. 03/22/19   Rory Percy, DO  triamcinolone (KENALOG) 0.025 % ointment Apply 1 application topically 2 (two) times daily. Patient not taking: Reported on 02/26/2019 01/23/17   Steve Rattler, DO    Allergies    Patient has no known allergies.  Review of Systems   Review of Systems  Constitutional: Negative for chills and fever.  HENT: Negative for ear pain and sore throat.   Eyes: Negative for pain and visual disturbance.  Respiratory: Negative for cough and shortness of breath.   Cardiovascular: Negative for chest pain and palpitations.  Gastrointestinal: Negative for abdominal pain and vomiting.  Genitourinary: Negative for dysuria and hematuria.  Musculoskeletal: Negative for arthralgias and back pain.  Skin: Negative for color change and rash.  Neurological: Positive for headaches. Negative for seizures and syncope.  All other systems reviewed and are negative.   Physical Exam Updated Vital Signs BP (!) 174/88   Pulse 78   Temp 98.1 F (36.7 C) (Oral)   Resp 18   SpO2 100%   Physical Exam Vitals and nursing note reviewed.  Constitutional:      General: She is not in acute distress.     Appearance: She is well-developed.  HENT:     Head: Normocephalic and atraumatic.  Eyes:     General: No visual field deficit.    Conjunctiva/sclera: Conjunctivae normal.  Cardiovascular:     Rate and Rhythm: Normal rate and regular rhythm.     Heart sounds: No murmur.  Pulmonary:     Effort: Pulmonary effort is normal. No respiratory distress.     Breath sounds: Normal breath sounds.  Abdominal:     Palpations: Abdomen is soft.     Tenderness: There is no abdominal tenderness.  Musculoskeletal:     Cervical back: Neck supple.  Skin:    General: Skin is warm and dry.  Neurological:     Mental Status: She is alert and oriented to person, place, and time.     GCS: GCS eye subscore is 4. GCS verbal subscore is 5. GCS motor subscore is 6.  Cranial Nerves: No cranial nerve deficit, dysarthria or facial asymmetry.     Sensory: No sensory deficit.     Motor: No weakness.     Coordination: Coordination normal.  Psychiatric:        Mood and Affect: Mood normal.        Speech: Speech normal.     ED Results / Procedures / Treatments   Labs (all labs ordered are listed, but only abnormal results are displayed) Labs Reviewed  CBC WITH DIFFERENTIAL/PLATELET - Abnormal; Notable for the following components:      Result Value   WBC 3.9 (*)    RBC 3.06 (*)    Hemoglobin 9.8 (*)    HCT 29.5 (*)    All other components within normal limits  BASIC METABOLIC PANEL - Abnormal; Notable for the following components:   Sodium 134 (*)    Chloride 97 (*)    Glucose, Bld 494 (*)    Creatinine, Ser 1.40 (*)    Calcium 8.5 (*)    GFR calc non Af Amer 44 (*)    GFR calc Af Amer 51 (*)    All other components within normal limits  CBG MONITORING, ED - Abnormal; Notable for the following components:   Glucose-Capillary 462 (*)    All other components within normal limits    EKG None  Radiology CT Head Wo Contrast  Result Date: 06/14/2019 CLINICAL DATA:  Sudden headache and  dizziness EXAM: CT HEAD WITHOUT CONTRAST TECHNIQUE: Contiguous axial images were obtained from the base of the skull through the vertex without intravenous contrast. COMPARISON:  None. FINDINGS: Brain: No evidence of acute infarction, hemorrhage, hydrocephalus, extra-axial collection or mass lesion/mass effect. Low-density changes within the periventricular and subcortical white matter, nonspecific but most commonly seen in the setting of chronic microvascular ischemic change. Vascular: No hyperdense vessel or unexpected calcification. Skull: Normal. Negative for fracture or focal lesion. Sinuses/Orbits: No acute finding. Other: None. IMPRESSION: 1. No acute intracranial pathology. 2. Low-density changes within the periventricular and subcortical white matter, nonspecific but most commonly seen in the setting of chronic microvascular ischemic change, although this is slightly greater than would be expected for patient's age. Demyelinating disease would also result in a similar appearance. Further evaluation can be obtained with MRI, as clinically indicated. Electronically Signed   By: Davina Poke D.O.   On: 06/14/2019 11:24    Procedures Procedures (including critical care time)  Medications Ordered in ED Medications  prochlorperazine (COMPAZINE) injection 5 mg (5 mg Intravenous Given 06/14/19 1029)  diphenhydrAMINE (BENADRYL) injection 12.5 mg (12.5 mg Intravenous Given 06/14/19 1028)  acetaminophen (TYLENOL) tablet 500 mg (500 mg Oral Given 06/14/19 1027)    ED Course  I have reviewed the triage vital signs and the nursing notes.  Pertinent labs & imaging results that were available during my care of the patient were reviewed by me and considered in my medical decision making (see chart for details).  Clinical Course as of Jun 14 1611  Mon Jun 14, 2019  1212 Rechcekd patient, headache resolved, no neuro complaints, doubt demyelinating process, discussed with patient and need to f/u with PCP  to discuss out patient MRI, will dc home   [RD]    Clinical Course User Index [RD] Lucrezia Starch, MD   MDM Rules/Calculators/A&P                      49 year old lady presenting to ER with severe headache.  On exam  patient noted to be well-appearing, no neurologic complaints, no neurologic deficits on my neuro exam.  Symptoms resolved after headache cocktail.  CT head was negative for acute pathology.  Radiologist commented on chronic microvascular ischemic change though somewhat greater than expected for patient's age and could not rule out demyelinating process.  Patient has no other complaints besides the headache today, specifically no other neurologic complaints.  Have very low suspicion for demyelinating process.  Discussed these results with patient and recommended she follow-up with her primary care doctor to discuss outpatient MRI.    After the discussed management above, the patient was determined to be safe for discharge.  The patient was in agreement with this plan and all questions regarding their care were answered.  ED return precautions were discussed and the patient will return to the ED with any significant worsening of condition.   Final Clinical Impression(s) / ED Diagnoses Final diagnoses:  Nonintractable headache, unspecified chronicity pattern, unspecified headache type    Rx / DC Orders ED Discharge Orders    None       Lucrezia Starch, MD 06/15/19 [phone]

## 2019-06-14 NOTE — ED Triage Notes (Signed)
Pt reports sudden HA and dizziness while working today. No neuro deficits.

## 2019-07-15 ENCOUNTER — Other Ambulatory Visit: Payer: Self-pay

## 2019-07-15 ENCOUNTER — Ambulatory Visit (INDEPENDENT_AMBULATORY_CARE_PROVIDER_SITE_OTHER): Payer: Self-pay | Admitting: Family Medicine

## 2019-07-15 VITALS — HR 123 | Temp 99.0°F | Wt 122.8 lb

## 2019-07-15 DIAGNOSIS — R000 Tachycardia, unspecified: Secondary | ICD-10-CM | POA: Insufficient documentation

## 2019-07-15 DIAGNOSIS — R059 Cough, unspecified: Secondary | ICD-10-CM

## 2019-07-15 DIAGNOSIS — R0602 Shortness of breath: Secondary | ICD-10-CM | POA: Insufficient documentation

## 2019-07-15 DIAGNOSIS — M25519 Pain in unspecified shoulder: Secondary | ICD-10-CM | POA: Insufficient documentation

## 2019-07-15 DIAGNOSIS — R05 Cough: Secondary | ICD-10-CM

## 2019-07-15 DIAGNOSIS — M25511 Pain in right shoulder: Secondary | ICD-10-CM

## 2019-07-15 HISTORY — DX: Shortness of breath: R06.02

## 2019-07-15 MED ORDER — DICLOFENAC SODIUM 1 % EX GEL: 2.0000 g | Freq: Four times a day (QID) | CUTANEOUS | 0 refills | Status: DC

## 2019-07-15 NOTE — Assessment & Plan Note (Signed)
Patient with multiple symptoms including cough, body aches, headache, runny nose.  Most concerning of which is shortness of breath with exertion.  No known sick contacts however is not sure if anyone is sick at work.  Given that patient has constellation of symptoms that are consistent with coronavirus advised coronavirus testing.  We are able to schedule her an appointment tomorrow morning at the [facility] testing center.  Quarantine precautions discussed.  Strict return precautions given.  ED precautions given.  Advised to go to the emergency room if shortness of breath worsens.  Currently is able to speak full sentences without increased work of breathing.  Is able to ambulate on her own throughout the clinic without shortness of breath.  Pulse ox showed O2 sats in the mid to high 90s.

## 2019-07-15 NOTE — Assessment & Plan Note (Signed)
Limited exam due to pain.  Advised Voltaren gel.  We will plan to assess in 1 week.  Given patient's other acute more serious symptoms such as shortness of breath and tachycardia the majority of visit was spent on that.  Advised that patient should follow-up for shoulder pain in 1 week.

## 2019-07-15 NOTE — Patient Instructions (Signed)
Today we discussed your symptoms of cough, shortness of breath, chest pain, shoulder pain.   1. Your COVID test will be on 07/16/19 @ [facility] [address] [place], [place] [address] 2. Please quarantine at home until you get your test results 3. If chest pain or shortness of breath PLEASE GO TO THE ER OR CALL 911 FOR EMS 4. Follow up in 1 week (if COVID is negative can be in person, if positive must be virtual).  5. Check your pulse at home tomorrow. If >110 call us immediatly   Dr. Tammi Klippel

## 2019-07-15 NOTE — Assessment & Plan Note (Signed)
Patient with tachycardia on presentation.  Heart rate 123 despite sitting in room for 15 minutes prior to checking.  This was confirmed with manual check as well as with pulse ox a second time.  Patient denies any palpitations.  Chest pain is reproducible on exam and is consistent with her shoulder pain that she has been feeling.  Differentials for tachycardia include related to viral process such as coronavirus, pain.  Hopeful that with pain reduction heart rate will decrease some.  If coronavirus will likely improve as disease processes improve.  Given strict ED precautions.  Advised to go to the emergency department with any chest pain or palpitations.  Discussed with preceptor.

## 2019-07-15 NOTE — Progress Notes (Signed)
SUBJECTIVE:   CHIEF COMPLAINT / HPI:   Cough/SOB Patient reports she started having a cough, runny nose, and thinks she may have pneumonia. Patient reports she started to have symptoms today when she was at work. Patient reports symptoms feel like when she had pneumonia. 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 Denies known sick contacts but does not know. Denies household members that are sick. Has not gotten the coronavirus vaccine. Denies fevers. Does have headache. Does report body aches, throughout right side and back. Decreased appetite. Able to taste and smell. Does have shortness of breath when she walks a long way, this started today. Reports chest pain that is on her right side by her shoulder. Denies palpitations.   Of note patient had several symptoms of COVID 19. When asked about screening questionnaire patient did report she did not tell the truth to the front desk staff when asked of symptoms of cough or SOB.    PERTINENT  PMH / PSH: HTN, DM,   OBJECTIVE:   Pulse (!) 123   Temp 99 F (37.2 C) (Oral)   Wt 122 lb 12.8 oz (55.7 kg)   SpO2 96%   BMI 18.13 kg/m   Gen: awake and alert, NAD HEENT: moist mucous membranes  Cardio: tachycardia, regular rhythm, no MRG, chest pain reproducible on exam Resp: CTAB, speaking full sentences, no increased WOB, no accessory muscle use, no wheezes, rales, or rhonchi  Ext: no edema Shoulder: Inspection reveals no obvious deformity, atrophy, or asymmetry. No bruising. No swelling Palpation is normal with no TTP over [facility] joint or bicipital groove. Limited ROM in flexion, abduction 2/2 pain. Normal internal/external rotation NV intact distally Normal scapular function observed. Special Tests:  - Impingement: Neg empty can sign. - Supraspinatous: Negative empty can.   - Infraspinatous/Teres Minor: 5/5 strength with ER - Subscapularis: 5/5 strength with IR  ASSESSMENT/PLAN:   Tachycardia Patient with tachycardia  on presentation.  Heart rate 123 despite sitting in room for 15 minutes prior to checking.  This was confirmed with manual check as well as with pulse ox a second time.  Patient denies any palpitations.  Chest pain is reproducible on exam and is consistent with her shoulder pain that she has been feeling.  Differentials for tachycardia include related to viral process such as coronavirus, pain.  Hopeful that with pain reduction heart rate will decrease some.  If coronavirus will likely improve as disease processes improve.  Given strict ED precautions.  Advised to go to the emergency department with any chest pain or palpitations.  Discussed with preceptor.  Shortness of breath Patient with multiple symptoms including cough, body aches, headache, runny nose.  Most concerning of which is shortness of breath with exertion.  No known sick contacts however is not sure if anyone is sick at work.  Given that patient has constellation of symptoms that are consistent with coronavirus advised coronavirus testing.  We are able to schedule her an appointment tomorrow morning at the [facility] testing center.  Quarantine precautions discussed.  Strict return precautions given.  ED precautions given.  Advised to go to the emergency room if shortness of breath worsens.  Currently is able to speak full sentences without increased work of breathing.  Is able to ambulate on her own throughout the clinic without shortness of breath.  Pulse ox showed O2 sats in the mid to high 90s.  Shoulder pain Limited exam due to pain.  Advised Voltaren gel.  We will plan to assess in 1 week.  Given patient's other acute more serious symptoms such as shortness of breath and tachycardia the majority of visit was spent on that.  Advised that patient should follow-up for shoulder pain in 1 week.     Caroline More, [place]

## 2019-07-16 ENCOUNTER — Other Ambulatory Visit: Payer: Self-pay | Admitting: Family Medicine

## 2019-07-16 ENCOUNTER — Telehealth: Payer: Self-pay | Admitting: Family Medicine

## 2019-07-16 ENCOUNTER — Ambulatory Visit: Payer: Medicaid Other | Attending: Internal Medicine

## 2019-07-16 DIAGNOSIS — Z20822 Contact with and (suspected) exposure to covid-19: Secondary | ICD-10-CM

## 2019-07-16 NOTE — Telephone Encounter (Signed)
Called patient to check in to see how she was doing. Patient states she feels better compared to appointment yesterday. States she no longer has any SOB. Does report continued shoulder pain. Was unable to pick up voltaren gel from pharmacy yesterday. Did get COVID test and is waiting for results. Denies CP.   I was able to walk patient through a manual pulse check. She was able to feel pule via carotids. Measured between 50-70 with multiple checks to confirm. This is improved from clinic visit of 120s.   States she is continuing to quarantine until she receives her test results. I discussed with her the after hours emergency line so that if symptoms worsen over the weekend she can call to discuss with a physician. Strict return precautions given. Will go to ED with SOB or CP.   Patient speaking full sentences, no increased WOB. She was appreciative of call.  Dalphine Handing, PGY-3 [facility] 07/16/2019 11:09 AM

## 2019-07-17 LAB — NOVEL CORONAVIRUS, NAA: SARS-CoV-2, NAA: NOT DETECTED

## 2019-07-19 ENCOUNTER — Telehealth: Payer: Self-pay | Admitting: Family Medicine

## 2019-07-19 MED ORDER — PREDNISONE 5 MG PO TABS: ORAL_TABLET | ORAL | 0 refills | Status: DC

## 2019-07-19 NOTE — Telephone Encounter (Signed)
Called and dicussed patient's symptoms and how she has been tolerating her steroid taper. She reports that she has had more fatigue and weakness over the past 2 weeks with her every other day 10mg . Will slow taper based on sx. Will now go to 10mg  every other day alternating with 2.5mg  every other day. Patient without hypotension or body aches, joint pain, nausea, loss of appetite or lightheadedness. Will trial this for one month and re-visit symptoms to see if we can continue with taper or need to allow her to have this dose for another month. Strict return precautions discussed and patient voiced understanding. Sent new taper to pharmacy.   [place] Demetres Prochnow, DO PGY-3, [facility]

## 2019-07-19 NOTE — Telephone Encounter (Signed)
Patient reporting with the 10 mg every other day that in the past 2 weeks she has been experiencing some significant fatigue.  She denies any body aches, nausea, vomiting or symptoms of low blood pressure, but the fatigue has been significant.  Discussed that we will continue with 10 mg every other day, but will also alternate with 2.5 mg every other day when she is not taking the 10 mg.  Patient voiced understanding of this plan.  We will also call pharmacy to ensure that they have the proper prescription sent.

## 2019-07-19 NOTE — Addendum Note (Signed)
Addended by: Shiara Mcgough, [place] J on: 07/19/2019 05:12 PM   Modules accepted: Orders

## 2019-07-26 ENCOUNTER — Telehealth (INDEPENDENT_AMBULATORY_CARE_PROVIDER_SITE_OTHER): Payer: Self-pay | Admitting: Family Medicine

## 2019-07-26 ENCOUNTER — Encounter: Payer: Self-pay | Admitting: Family Medicine

## 2019-07-26 ENCOUNTER — Other Ambulatory Visit: Payer: Self-pay

## 2019-07-26 ENCOUNTER — Ambulatory Visit (INDEPENDENT_AMBULATORY_CARE_PROVIDER_SITE_OTHER): Payer: Medicaid Other | Admitting: Family Medicine

## 2019-07-26 VITALS — BP 124/78 | HR 106 | Ht 69.0 in | Wt 131.8 lb

## 2019-07-26 DIAGNOSIS — R5383 Other fatigue: Secondary | ICD-10-CM

## 2019-07-26 DIAGNOSIS — E1140 Type 2 diabetes mellitus with diabetic neuropathy, unspecified: Secondary | ICD-10-CM

## 2019-07-26 DIAGNOSIS — R05 Cough: Secondary | ICD-10-CM

## 2019-07-26 DIAGNOSIS — R0602 Shortness of breath: Secondary | ICD-10-CM

## 2019-07-26 DIAGNOSIS — R053 Chronic cough: Secondary | ICD-10-CM

## 2019-07-26 DIAGNOSIS — Z7952 Long term (current) use of systemic steroids: Secondary | ICD-10-CM

## 2019-07-26 DIAGNOSIS — M25519 Pain in unspecified shoulder: Secondary | ICD-10-CM

## 2019-07-26 DIAGNOSIS — Z1231 Encounter for screening mammogram for malignant neoplasm of breast: Secondary | ICD-10-CM

## 2019-07-26 DIAGNOSIS — Z794 Long term (current) use of insulin: Secondary | ICD-10-CM

## 2019-07-26 LAB — POCT GLYCOSYLATED HEMOGLOBIN (HGB A1C): HbA1c, POC (controlled diabetic range): 10 % — AB (ref 0.0–7.0)

## 2019-07-26 MED ORDER — METFORMIN HCL 1000 MG PO TABS: ORAL_TABLET | ORAL | 5 refills | Status: DC

## 2019-07-26 NOTE — Progress Notes (Signed)
SUBJECTIVE:   CHIEF COMPLAINT / HPI: Continued symptoms  Shortness of Breath  Chronic Cough: Patient with continued shortness of breath. States she has been able to walk stairs at her office. She reports she has had intermittent leg swelling at the end of the day. She denies any chest pain associated with her shortness of breath.  She reports she is still having issues with her chronic cough which she believes is due to smoker's cough.  She reports she is not having any productive cough.  She reports she has not yet gone to have her chest x-ray done.  Patient reports that she still smokes about a pack a day.  Fatigue  Unintentional weight loss: Patient reports that she is continuing to be fatigued. She states that this has worsened over the past few months.  She reports she still does not have much of an appetite but she is able to force herself to eat. Patient's weight has plateaued from her original 30 pound weight loss and she now has stable weight after esophageal candidiasis treated, but she still has no appetite. Patient denies any fevers, chills, night sweats. She does report that she is feeling tired every day not just the days that she has the lower dosage of the prednisone.  Patient states that this did not help her symptoms.  She denies any dizziness or lightheadedness.  Diabetes: Medications: Patient stopped taking Metformin about a year ago Compliance: Not on any other medications Hypoglycemic symptoms: no Last foot exam:  will need performed at follow-up ROS: denies dizziness, diaphoresis, LOC, polyuria, polydipsia  PERTINENT  PMH / PSH: HTN, T2DM, chronic steroid use, tobacco use disorder, HLD, h/o alcohol use disorder,  OBJECTIVE:  BP 124/78   Pulse (!) 106   Ht 5\' 9"  (1.753 m)   Wt 131 lb 12.8 oz (59.8 kg)   SpO2 96%   BMI 19.46 kg/m   General: NAD, pleasant Neck: Supple, no LAD Cardiovascular: RRR, 2/6 systolic murmur, no LE edema Respiratory: CTA BL, normal work  of breathing, no accessory muscle use Neuro: CN II-XII grossly intact Psych: AOx3, appropriate affect  ASSESSMENT/PLAN:   Fatigue  Unknown etiology.  Could be secondary to chronic steroid use but want to rule out other causes.  Patient previously on prednisone 10 mg daily for 2 years and has recently started to wean.  Initially was weaned to 10 mg QOD due to her symptoms we started doing 10 mg alternating with 2.5 mg, no improvement and patient continues not feeling well every day not just days with lower dosing. Will hold off on changing dose for now.   Colonoscopy done 01/2019 was grossly normal.  Will obtain screening mammogram, patient denies any masses.   TSH 1.280, free T4 0.78 (minimally decreased).  Albumin low at 3.0, LFT's wnl.   Patient with history of alcohol use drinking 36 oz of beer per day.  Can be contributing to patient's fatigue.  Given resources to call for discussion on quitting drinking  CBC in office with Hemoglobin 19.1, platelets 673, WBC 13.2 and patient with history of anemia.  Likely that these numbers are abnormal as patient did not hear dehydrated on exam.  Patient called to repeat lab.  HIV neg 02/2019; hepatitis panel neg 2014; RPR neg 2018 (will repeat)  Patient is chronic smoker (1ppd) but does not yet meet criteria for low-dose CT.  Will obtain CXR given also has history of chronic cough.  Patient with intermittent leg swelling, will obtain echocardiogram  to further evaluate for new CHF given BNP of 241 during office visit.  Patient also with previously well-controlled diabetes and now with A1c of 10.0 which could be contributing to fatigue given poorly controlled diabetes (even in setting of weaning steroids).  Discussed starting insulin with patient but will try Metformin and lifestyle changes.  Given meter for patient to check blood glucose.   Follow up in 2 weeks or sooner if needed.   Patient also needs pap smear at next visit  Shortness of  breath Patient with intermittent leg swelling, will obtain echocardiogram to further evaluate for new CHF given BNP of 241 during office visit.  CXR ordered.  New murmur noted on exam.  No associated chest pain.  If echo grossly normal may need referral for stress testing.   Martinique Oakes Mccready, DO PGY-3, [facility]

## 2019-07-26 NOTE — Patient Instructions (Addendum)
Thank you for coming to see me today. It was a pleasure! Today we talked about:   I will call you with your lab results to discuss later this week.  Please go have a chest x-ray done in order to determine what might be causing your chronic cough.  Please schedule a mammogram.  I have put in a referral and they should call you in order to have this scheduled.  Please restart your metformin at 1000 mg daily.  Please try to avoid drinking regular sodas and sweet tea.  Please follow-up with me on April 14 at 4:10 PM.  If you have any questions or concerns, please do not hesitate to call the office at [phone].  Take Care,   [place] Nehal Shives, DO    Diet Recommendations for Diabetes  Carbohydrate includes starch, sugar, and fiber.  Of these, only sugar and starch raise blood glucose.  (Fiber is found in fruits, vegetables [especially skin, seeds, and stalks] and whole grains.)   Starchy (carb) foods: Bread, rice, pasta, potatoes, corn, cereal, grits, crackers, bagels, muffins, all baked goods.  (Fruit, milk, and yogurt also have carbohydrate, but most of these foods will not spike your blood sugar as most starchy foods will.)  A few fruits do cause high blood sugars; use small portions of bananas (limit to 1/2 at a time), grapes, watermelon, oranges, and most tropical fruits.   Protein foods: Meat, fish, poultry, eggs, dairy foods, and beans such as pinto and kidney beans (beans also provide carbohydrate).   1. Eat at least REAL 3 meals and 1-2 snacks per day. Never go more than 4-5 hours while awake without eating. Eat breakfast within the first hour of getting up.   2. Limit starchy foods to TWO per meal and ONE per snack. ONE portion of a starchy  food is equal to the following:   - ONE slice of bread (or its equivalent, such as half of a hamburger bun).   - 1/2 cup of a "scoopable" starchy food such as potatoes or rice.   - 15 grams of Total Carbohydrate as shown on food label.  3.  Include at every meal: a protein food, a carb food, and vegetables and/or fruit.   - Obtain twice the volume of veg's as protein or carbohydrate foods for both lunch and dinner.   - Fresh or frozen veg's are best.   - Keep frozen veg's on hand for a quick vegetable serving.      Alcohol Abuse and Dependence Information, Adult General information  Talk to your family, coworkers, and friends about supporting you in your efforts to stop drinking. If they drink, ask them not to drink around you. Spend more time with people who do not drink alcohol.  If you think that you have an alcohol dependency problem: ? Tell friends or family about your concerns. ? Talk with your health care provider or another health professional about where to get help. ? Work with a Transport planner and a Regulatory affairs officer. ? Consider joining a support group for people who struggle with alcohol abuse and dependence. Where to find support   Your health care provider.  SMART Recovery: www.smartrecovery.org Therapy and support groups  Local treatment centers or chemical dependency counselors.  Local AA groups in your community: [employer]Tax.com.pt Where to find more information  Centers for Disease Control and Prevention: http://www.wolf.info/  National Institute on Alcohol Abuse and Alcoholism: http://www.bradshaw.com/  Alcoholics Anonymous (AA): NicTax.com.pt Contact a health care provider if:  You drank more or for longer than you intended on more than one occasion.  You tried to stop drinking or to cut back on how much you drink, but you were not able to.  You often drink to the point of vomiting or passing out.  You want to drink so badly that you cannot think about anything else.  You have problems in your life due to drinking, but you continue to drink.  You keep drinking even though you feel anxious, depressed, or have experienced memory loss.  You have stopped doing the things you used to enjoy in order to  drink.  You have to drink more than you used to in order to get the effect you want.  You experience anxiety, sweating, nausea, shakiness, and trouble sleeping when you try to stop drinking. Get help right away if:  You have thoughts about hurting yourself or others.  You have serious withdrawal symptoms, including: ? Confusion. ? Racing heart. ? High blood pressure. ? Fever. If you ever feel like you may hurt yourself or others, or have thoughts about taking your own life, get help right away. You can go to your nearest emergency department or call:  Your local emergency services (911 in the [place]  A suicide crisis helpline, such as the [place] at 984-465-4698. This is open 24 hours a day. Summary  Alcohol abuse and dependence can have a negative effect on your life. Drinking too much or too often can lead to addiction.  If you drink alcohol, limit how much you use.  If you are having trouble keeping your drinking under control, find ways to change your behavior. Hobbies, calming activities, exercise, or support groups can help.  If you feel you need help with changing your drinking habits, talk with your health care provider, a good friend, or a therapist, or go to an [place] group. This information is not intended to replace advice given to you by your health care provider. Make sure you discuss any questions you have with your health care provider. Document Revised: 08/04/2018 Document Reviewed: 06/23/2018 Elsevier Patient Education  [place].

## 2019-07-26 NOTE — Progress Notes (Signed)
[facility] [place] [facility] Visit  Attempted MyChart video visit with no response. Attempted to call and use doximity with no response. Patient would more appropriate for in person visit given her shoulder pain. She had respiratory symptoms 10 days ago, and tested negative for covid at that time (3/19).   [place] Yohanna Tow, DO PGY-3, [facility]

## 2019-07-27 LAB — COMPREHENSIVE METABOLIC PANEL
ALT: 15 IU/L (ref 0–32)
AST: 23 IU/L (ref 0–40)
Albumin/Globulin Ratio: 1.3 (ref 1.2–2.2)
Albumin: 3 g/dL — ABNORMAL LOW (ref 3.8–4.8)
Alkaline Phosphatase: 111 IU/L (ref 39–117)
BUN/Creatinine Ratio: 7 — ABNORMAL LOW (ref 9–23)
BUN: 8 mg/dL (ref 6–24)
Bilirubin Total: <0.2 mg/dL (ref 0.0–1.2)
CO2: 26 mmol/L (ref 20–29)
Calcium: 8.4 mg/dL — ABNORMAL LOW (ref 8.7–10.2)
Chloride: 102 mmol/L (ref 96–106)
Creatinine, Ser: 1.23 mg/dL — ABNORMAL HIGH (ref 0.57–1.00)
GFR calc Af Amer: 60 mL/min/{1.73_m2} (ref >59)
GFR calc non Af Amer: 52 mL/min/{1.73_m2} — ABNORMAL LOW (ref >59)
Globulin, Total: 2.4 g/dL (ref 1.5–4.5)
Glucose: 230 mg/dL — ABNORMAL HIGH (ref 65–99)
Potassium: 4.3 mmol/L (ref 3.5–5.2)
Sodium: 139 mmol/L (ref 134–144)
Total Protein: 5.4 g/dL — ABNORMAL LOW (ref 6.0–8.5)

## 2019-07-27 LAB — CBC WITH DIFFERENTIAL/PLATELET
Basophils Absolute: 0.1 10*3/uL (ref 0.0–0.2)
Basos: 1 %
EOS (ABSOLUTE): 0.1 10*3/uL (ref 0.0–0.4)
Eos: 1 %
Hematocrit: 57 % — ABNORMAL HIGH (ref 34.0–46.6)
Hemoglobin: 19.1 g/dL — ABNORMAL HIGH (ref 11.1–15.9)
Immature Grans (Abs): 0.1 10*3/uL (ref 0.0–0.1)
Immature Granulocytes: 1 %
Lymphocytes Absolute: 3.3 10*3/uL — ABNORMAL HIGH (ref 0.7–3.1)
Lymphs: 25 %
MCH: 30.8 pg (ref 26.6–33.0)
MCHC: 33.5 g/dL (ref 31.5–35.7)
MCV: 92 fL (ref 79–97)
Monocytes Absolute: 0.6 10*3/uL (ref 0.1–0.9)
Monocytes: 4 %
Neutrophils Absolute: 9.1 10*3/uL — ABNORMAL HIGH (ref 1.4–7.0)
Neutrophils: 68 %
Platelets: 673 10*3/uL — ABNORMAL HIGH (ref 150–450)
RBC: 6.2 x10E6/uL — ABNORMAL HIGH (ref 3.77–5.28)
RDW: 14.4 % (ref 11.7–15.4)
WBC: 13.2 10*3/uL — ABNORMAL HIGH (ref 3.4–10.8)

## 2019-07-27 LAB — TSH+FREE T4
Free T4: 0.78 ng/dL — ABNORMAL LOW (ref 0.82–1.77)
TSH: 1.28 u[IU]/mL (ref 0.450–4.500)

## 2019-07-27 LAB — LIPASE: Lipase: 7 U/L — ABNORMAL LOW (ref 14–72)

## 2019-07-27 LAB — BRAIN NATRIURETIC PEPTIDE: BNP: 241.1 pg/mL — ABNORMAL HIGH (ref 0.0–100.0)

## 2019-07-28 ENCOUNTER — Encounter: Payer: Self-pay | Admitting: Family Medicine

## 2019-07-28 ENCOUNTER — Other Ambulatory Visit: Payer: Self-pay | Admitting: Family Medicine

## 2019-07-28 DIAGNOSIS — R5383 Other fatigue: Secondary | ICD-10-CM

## 2019-07-28 NOTE — Progress Notes (Signed)
Pt has appt in [facility] on 4/14

## 2019-07-28 NOTE — Progress Notes (Signed)
Please call and schedule patient for echocardiogram as soon as you are able.  We will need to repeat CBC given results do not seem to coincide with patient's previous CBCs.

## 2019-07-29 ENCOUNTER — Other Ambulatory Visit: Payer: Self-pay

## 2019-07-29 MED ORDER — ACCU-CHEK SOFTCLIX LANCET DEV KIT: 1.0000 [IU] | PACK | Freq: Once | 0 refills | Status: AC

## 2019-07-29 MED ORDER — ACCU-CHEK SOFTCLIX LANCETS MISC: 12 refills | Status: DC

## 2019-07-29 MED ORDER — ACCU-CHEK GUIDE W/DEVICE KIT: 1.0000 [IU] | PACK | Freq: Once | 0 refills | Status: DC

## 2019-07-29 MED ORDER — ACCU-CHEK GUIDE VI STRP: ORAL_STRIP | 12 refills | Status: DC

## 2019-07-29 MED ORDER — ACCU-CHEK GUIDE W/DEVICE KIT: 1.0000 [IU] | PACK | Freq: Once | 0 refills | Status: AC

## 2019-07-29 NOTE — Assessment & Plan Note (Addendum)
Patient with intermittent leg swelling, will obtain echocardiogram to further evaluate for new CHF given BNP of 241 during office visit.  CXR ordered.  New murmur noted on exam.  No associated chest pain.  If echo grossly normal may need referral for stress testing.

## 2019-07-29 NOTE — Assessment & Plan Note (Addendum)
   Unknown etiology.  Could be secondary to chronic steroid use but want to rule out other causes.  Patient previously on prednisone 10 mg daily for 2 years and has recently started to wean.  Initially was weaned to 10 mg QOD due to her symptoms we started doing 10 mg alternating with 2.5 mg, no improvement and patient continues not feeling well every day not just days with lower dosing. Will hold off on changing dose for now.   Colonoscopy done 01/2019 was grossly normal.  Will obtain screening mammogram, patient denies any masses.   TSH 1.280, free T4 0.78 (minimally decreased).  Albumin low at 3.0, LFT's wnl.   Patient with history of alcohol use drinking 36 oz of beer per day.  Can be contributing to patient's fatigue.  Given resources to call for discussion on quitting drinking  CBC in office with Hemoglobin 19.1, platelets 673, WBC 13.2 and patient with history of anemia.  Likely that these numbers are abnormal as patient did not hear dehydrated on exam.  Patient called to repeat lab.  HIV neg 02/2019; hepatitis panel neg 2014; RPR neg 2018 (will repeat)  Patient is chronic smoker (1ppd) but does not yet meet criteria for low-dose CT.  Will obtain CXR given also has history of chronic cough.  Patient with intermittent leg swelling, will obtain echocardiogram to further evaluate for new CHF given BNP of 241 during office visit.  Patient also with previously well-controlled diabetes and now with A1c of 10.0 which could be contributing to fatigue given poorly controlled diabetes (even in setting of weaning steroids).  Discussed starting insulin with patient but will try Metformin and lifestyle changes.  Given meter for patient to check blood glucose.   Follow up in 2 weeks or sooner if needed.   Patient also needs pap smear at next visit

## 2019-08-02 ENCOUNTER — Encounter: Payer: Self-pay | Admitting: Family Medicine

## 2019-08-02 NOTE — Telephone Encounter (Signed)
Spoke with pt. She stated that she was going to come in for her appt that is on 4/14 at 4:10. Salvatore Marvel, CMA

## 2019-08-02 NOTE — Telephone Encounter (Signed)
-----   Message from Martinique Shirley, DO sent at 08/02/2019 11:55 AM EDT ----- Regarding: Labs Can we call this patient and be sure she comes in for repeat labs? I messaged her on mychart and tried to call previously. She needs repeat blood work, but I also need a UA with micro and a urine protein/creatinine ratio. I cannot place the order on epic but will as soon as I can get to a computer. Please let me know if there are any issues. Thank you!

## 2019-08-10 ENCOUNTER — Ambulatory Visit: Payer: Medicaid Other

## 2019-08-11 ENCOUNTER — Ambulatory Visit: Payer: Medicaid Other

## 2019-08-12 ENCOUNTER — Other Ambulatory Visit (HOSPITAL_COMMUNITY): Payer: Medicaid Other

## 2019-08-18 ENCOUNTER — Ambulatory Visit: Payer: Medicaid Other | Admitting: Family Medicine

## 2019-08-19 ENCOUNTER — Ambulatory Visit: Payer: Medicaid Other | Admitting: Family Medicine

## 2019-08-30 ENCOUNTER — Other Ambulatory Visit: Payer: Self-pay

## 2019-08-30 ENCOUNTER — Ambulatory Visit (HOSPITAL_COMMUNITY): Payer: Self-pay | Attending: Cardiology

## 2019-08-30 DIAGNOSIS — R5383 Other fatigue: Secondary | ICD-10-CM | POA: Insufficient documentation

## 2019-09-29 ENCOUNTER — Ambulatory Visit (HOSPITAL_COMMUNITY)
Admission: RE | Admit: 2019-09-29 | Discharge: 2019-09-29 | Disposition: A | Discharge status: Home / Self Care | Payer: Self-pay | Source: Ambulatory Visit | Attending: Family Medicine | Admitting: Family Medicine

## 2019-09-29 ENCOUNTER — Encounter: Payer: Self-pay | Admitting: Family Medicine

## 2019-09-29 ENCOUNTER — Other Ambulatory Visit: Payer: Self-pay

## 2019-09-29 ENCOUNTER — Ambulatory Visit (INDEPENDENT_AMBULATORY_CARE_PROVIDER_SITE_OTHER): Payer: Self-pay | Admitting: Family Medicine

## 2019-09-29 ENCOUNTER — Other Ambulatory Visit: Payer: Self-pay | Admitting: Family Medicine

## 2019-09-29 ENCOUNTER — Encounter (HOSPITAL_COMMUNITY): Payer: Self-pay

## 2019-09-29 VITALS — BP 130/76 | HR 89 | Ht 69.0 in | Wt 116.2 lb

## 2019-09-29 DIAGNOSIS — R5383 Other fatigue: Secondary | ICD-10-CM | POA: Insufficient documentation

## 2019-09-29 DIAGNOSIS — Z794 Long term (current) use of insulin: Secondary | ICD-10-CM

## 2019-09-29 DIAGNOSIS — R634 Abnormal weight loss: Secondary | ICD-10-CM

## 2019-09-29 DIAGNOSIS — Z720 Tobacco use: Secondary | ICD-10-CM | POA: Insufficient documentation

## 2019-09-29 DIAGNOSIS — E1140 Type 2 diabetes mellitus with diabetic neuropathy, unspecified: Secondary | ICD-10-CM

## 2019-09-29 LAB — POCT HEMOGLOBIN: Hemoglobin: 11.9 g/dL (ref 11–14.6)

## 2019-09-29 LAB — POCT I-STAT CREATININE: Creatinine, Ser: 1.1 mg/dL — ABNORMAL HIGH (ref 0.44–1.00)

## 2019-09-29 LAB — POCT GLYCOSYLATED HEMOGLOBIN (HGB A1C): HbA1c, POC (controlled diabetic range): 5.9 % (ref 0.0–7.0)

## 2019-09-29 MED ORDER — IOHEXOL 300 MG/ML  SOLN
75.0000 mL | Freq: Once | INTRAMUSCULAR | Status: AC | PRN
  Administered 2019-09-29: 75 mL via INTRAVENOUS

## 2019-09-29 MED ORDER — SODIUM CHLORIDE (PF) 0.9 % IJ SOLN
INTRAMUSCULAR | Status: AC
  Filled 2019-09-29: qty 50

## 2019-09-29 NOTE — Progress Notes (Signed)
Creatinine ordered i-STAT for CT imaging  Dr. Criss Rosales

## 2019-09-29 NOTE — Assessment & Plan Note (Addendum)
We do concern for malignancy given recent CBC in March 2021 with concern for polycythemia.  I have ordered new CBC, as well as mammogram, chest CT, CMP, TSH, HIV  Point-of-care A1c was decreased to 5.9 from 10 prior, concerned this might be as much from malnutrition as from good diabetes control.  Hemoglobin improved to 11.9 from over 19, again concern for chronic disease/iron deficiency as opposed to improvement of status given patient's weight loss  Patient scheduled for follow-up with Dr. Enid Derry on Friday.  Patient has been notified that we are concerned about potential cancer

## 2019-09-29 NOTE — Assessment & Plan Note (Signed)
Last A1c was charted at 10 which is significantly more than normal, today recheck was 5.9.  I am concerned that this is less diabetic control than significant malnutrition  Patient has significant work-up for weight loss pending, will be following up with Dr. Enid Derry on Friday

## 2019-09-29 NOTE — Patient Instructions (Signed)
Another today we had a very concerning talk about your weight loss.  I think that we have a lot of work to do in terms of checking up on things.  I would order a number of labs to check on your status.  Were also going to ask you to call the [facility] breast center to arrange a mammogram.  Our staff here has arranged you to get a chest CT to check out your lungs, and we can have you come back to talk about this all with Dr. Enid Derry on Friday at 11:10 AM.  I know this probably seems overwhelming but lets just get through the work-up and will react to answers as they come in.  Dr. Criss Rosales

## 2019-09-29 NOTE — Progress Notes (Signed)
    SUBJECTIVE:   CHIEF COMPLAINT / HPI: Unexplained weight loss  Patient with unexplained 40 pound weight loss in the last 2 years.  Did just get through a course recently of candidal esophagitis.  Patient did have concerning work-up in March with Dr. Enid Derry, patient was then lost to follow-up as we were unable to reach her on the phone and she did not come to schedule follow-up.  She has lost some more weight since then, she does not have any pain at this visit but does feel general fatigue and is nervous about her weight loss.  She says there is no desire for her to be losing weight and she would love to gain weight and she has been trying to eat cheese she says she just off the does not have the appetite.  She denies any trouble swallowing.  There was concern for polycythemia on her most recent CBC, TSH was normal although T4 was low at 0.78.  A1c had been recorded at 10 which is significantly more than normal.  But hemoglobin 19.1/WBC 13.2/RBC 6.2/platelets 673 all concerning.  She had been told to get a chest x-ray as a follow-up to attempt to rule out malignancy after a clear pneumonia at this never got accomplished.  She been asked to get a mammogram which never got accomplished.  She does say she is willing to do all the work-up now and seems in good spirits despite her condition.  PERTINENT  PMH / PSH:   OBJECTIVE:   BP 130/76   Pulse 89   Ht 5\' 9"  (1.753 m)   Wt 116 lb 3.2 oz (52.7 kg)   SpO2 99%   BMI 17.16 kg/m   General: Fatigued and cachectic Neck: No palpable lymph nodes submandibular/thyroid/anterior/posterior cervical chain. Cardiac: Regular rate and rhythm, 2/6 systolic murmur Respiratory: No increased work of breathing, good movement throughout, no crackles or rhonchi  ASSESSMENT/PLAN:   Diabetes mellitus with neuropathy (HCC) Last A1c was charted at 10 which is significantly more than normal, today recheck was 5.9.  I am concerned that this is less diabetic control  than significant malnutrition  Patient has significant work-up for weight loss pending, will be following up with Dr. Enid Derry on Friday  Abnormal weight loss We do concern for malignancy given recent CBC in March 2021 with concern for polycythemia.  I have ordered new CBC, as well as mammogram, chest CT, CMP, TSH, HIV  Point-of-care A1c was decreased to 5.9 from 10 prior, concerned this might be as much from malnutrition as from good diabetes control.  Hemoglobin improved to 11.9 from over 19, again concern for chronic disease/iron deficiency as opposed to improvement of status given patient's weight loss  Patient scheduled for follow-up with Dr. Enid Derry on Friday.  Patient has been notified that we are concerned about potential cancer    Sherene Sires, [place]

## 2019-09-30 LAB — COMPREHENSIVE METABOLIC PANEL
ALT: 33 IU/L — ABNORMAL HIGH (ref 0–32)
AST: 107 IU/L — ABNORMAL HIGH (ref 0–40)
Albumin/Globulin Ratio: 1.5 (ref 1.2–2.2)
Albumin: 4 g/dL (ref 3.8–4.8)
Alkaline Phosphatase: 146 IU/L — ABNORMAL HIGH (ref 48–121)
BUN/Creatinine Ratio: 8 — ABNORMAL LOW (ref 9–23)
BUN: 8 mg/dL (ref 6–24)
Bilirubin Total: <0.2 mg/dL (ref 0.0–1.2)
CO2: 18 mmol/L — ABNORMAL LOW (ref 20–29)
Calcium: 9 mg/dL (ref 8.7–10.2)
Chloride: 107 mmol/L — ABNORMAL HIGH (ref 96–106)
Creatinine, Ser: 1.02 mg/dL — ABNORMAL HIGH (ref 0.57–1.00)
GFR calc Af Amer: 75 mL/min/{1.73_m2} (ref >59)
GFR calc non Af Amer: 65 mL/min/{1.73_m2} (ref >59)
Globulin, Total: 2.6 g/dL (ref 1.5–4.5)
Glucose: 58 mg/dL — ABNORMAL LOW (ref 65–99)
Potassium: 4.3 mmol/L (ref 3.5–5.2)
Sodium: 142 mmol/L (ref 134–144)
Total Protein: 6.6 g/dL (ref 6.0–8.5)

## 2019-09-30 LAB — CBC WITH DIFFERENTIAL/PLATELET
Basophils Absolute: 0.1 10*3/uL (ref 0.0–0.2)
Basos: 1 %
EOS (ABSOLUTE): 0 10*3/uL (ref 0.0–0.4)
Eos: 1 %
Hematocrit: 34.5 % (ref 34.0–46.6)
Hemoglobin: 11.8 g/dL (ref 11.1–15.9)
Immature Grans (Abs): 0 10*3/uL (ref 0.0–0.1)
Immature Granulocytes: 0 %
Lymphocytes Absolute: 1.3 10*3/uL (ref 0.7–3.1)
Lymphs: 33 %
MCH: 32 pg (ref 26.6–33.0)
MCHC: 34.2 g/dL (ref 31.5–35.7)
MCV: 94 fL (ref 79–97)
Monocytes Absolute: 0.2 10*3/uL (ref 0.1–0.9)
Monocytes: 5 %
Neutrophils Absolute: 2.3 10*3/uL (ref 1.4–7.0)
Neutrophils: 60 %
Platelets: 238 10*3/uL (ref 150–450)
RBC: 3.69 x10E6/uL — ABNORMAL LOW (ref 3.77–5.28)
RDW: 14.5 % (ref 11.7–15.4)
WBC: 3.9 10*3/uL (ref 3.4–10.8)

## 2019-09-30 LAB — HCV INTERPRETATION

## 2019-09-30 LAB — TSH+FREE T4
Free T4: 0.67 ng/dL — ABNORMAL LOW (ref 0.82–1.77)
TSH: 1.43 u[IU]/mL (ref 0.450–4.500)

## 2019-09-30 LAB — HIV ANTIBODY (ROUTINE TESTING W REFLEX): HIV Screen 4th Generation wRfx: NONREACTIVE

## 2019-09-30 LAB — HCV AB W REFLEX TO QUANT PCR: HCV Ab: <0.1 s/co ratio (ref 0.0–0.9)

## 2019-10-01 ENCOUNTER — Other Ambulatory Visit: Payer: Self-pay

## 2019-10-01 ENCOUNTER — Encounter: Payer: Self-pay | Admitting: Family Medicine

## 2019-10-01 ENCOUNTER — Ambulatory Visit (INDEPENDENT_AMBULATORY_CARE_PROVIDER_SITE_OTHER): Payer: Self-pay | Admitting: Family Medicine

## 2019-10-01 VITALS — BP 130/70 | HR 100 | Ht 69.0 in | Wt 114.0 lb

## 2019-10-01 DIAGNOSIS — F17200 Nicotine dependence, unspecified, uncomplicated: Secondary | ICD-10-CM

## 2019-10-01 DIAGNOSIS — R0602 Shortness of breath: Secondary | ICD-10-CM

## 2019-10-01 DIAGNOSIS — K861 Other chronic pancreatitis: Secondary | ICD-10-CM

## 2019-10-01 DIAGNOSIS — K760 Fatty (change of) liver, not elsewhere classified: Secondary | ICD-10-CM

## 2019-10-01 DIAGNOSIS — I739 Peripheral vascular disease, unspecified: Secondary | ICD-10-CM

## 2019-10-01 DIAGNOSIS — R634 Abnormal weight loss: Secondary | ICD-10-CM

## 2019-10-01 DIAGNOSIS — R6889 Other general symptoms and signs: Secondary | ICD-10-CM

## 2019-10-01 MED ORDER — ATORVASTATIN CALCIUM 20 MG PO TABS: 20.0000 mg | ORAL_TABLET | Freq: Every day | ORAL | 3 refills | Status: DC

## 2019-10-01 NOTE — Patient Instructions (Addendum)
Thank you for coming to see me today. It was a pleasure! Today we talked about:   Please decrease the amount of prednisone you are taking to 10 mg every other day alternating with 2.5 mg.  I have placed a referral for a liver doctor (hepatology).  If you do not hear from them within 2 weeks then please let our office know.    I have also placed a referral for a vascular doctor due to not having good blood flow to your feet which is likely causing your pain.  If you do not hear from them within 2 weeks then please let our office know.  For this, I have also started you on a new medication called Lipitor.  Please take this daily.  It is also important that you continue to exercise as much as you are able.  Please follow-up with our clinic after the results return or sooner as needed.  If you have any questions or concerns, please do not hesitate to call the office at [phone].  Take Care,   [place] Jamicia Haaland, DO

## 2019-10-01 NOTE — Progress Notes (Signed)
SUBJECTIVE:   CHIEF COMPLAINT / HPI:   Unintentional weight loss  fatigue: Patient with recent CT scan of lungs that does not show any signs of cancer or cause for abnormal weight loss.  Recent lab work shows improved CBC.  Patient reports that she is still losing weight and is down to 114 pounds at home.  She states that she has cut down on her drinking and is now only having a couple of alcoholic beverages on the weekends.  She states that she is still having shortness of breath when she goes up the stairs.  Recent CT of lungs showed hepatic steatosis as well as signs of chronic pancreatitis.  Patient has not had any recent imaging done of abdomen.  She has never seen a liver doctor but has been followed with GI who has done a recent colonoscopy and EGD.  Patient still remains on chronic prednisone she reports taking 10 every other day with 5 every other day.  She denies any decrease in appetite and states that she is trying to increase calories by drinking Ensure.  Patient reports that she still smokes and does not use any illicit drugs.  A1c well controlled now at 5.9, HIV nonreactive, hepatitis C negative.  Has had negative hepatitis panel 2014.  Patient with low albumin.  Has still not been able to obtain mammogram.  Patient is also not up-to-date on Pap smear and will need to have this done.  Patient denies any night sweats, fevers, chills.  Foot pain worse on the left: Patient reports that for the past few weeks she has had 6 out of 10 foot pain on the left and sometimes happens on the right.  She states it is worse after walking and resolves when she sits down to rest.  Patient  PERTINENT  PMH / PSH: HTN, T2DM, chronic steroid use, tobacco use disorder, HLD, h/o alcohol use disorder  OBJECTIVE:  BP 130/70   Pulse 100   Ht 5\' 9"  (1.753 m)   Wt 114 lb (51.7 kg)   SpO2 95%   BMI 16.83 kg/m   General: NAD, pleasant, cachectic Neck: Supple, no LAD Cardiovascular: RRR, no m/r/g,  no LE edema, nonpalpable pedal pulses bilaterally, no signs of ischemia Respiratory: CTA BL, normal work of breathing Psych: AOx3, appropriate affect  ASSESSMENT/PLAN:   Abnormal weight loss Unintentional weight loss, will need to rule out malignancy.  Other causes include worsening liver function given abnormal LFTs and recent hepatic steatosis.  Patient is on HIV nonreactive.  Patient now at 114 pounds.  Patient is a smoker and recent CT chest with no signs of malignancy but does show hepatic steatosis as well as chronic pancreatitis. CT abdomen pelvis obtained 6/7, which shows severe chronic pancreatitis with hepatic steatosis but no signs of masses or concern for malignancy. -Refer to hepatology and have patient follow-up with GI -Patient will get her mammogram -Patient will need Pap smear at follow-up visit, not currently having any abnormal uterine bleeding -Continue to wean prednisone to now 10 mg every other day alternating with 2.5 mg. -Patient also with history of chronic alcohol abuse and has decreased drinking to weekends now but encouraged patient to stop altogether given that this could be contributing to her weight loss  Shortness of breath Has been going on for months however patient is high risk for ischemic causes.  Recent CT chest with no obvious indications for shortness of breath.  Patient is a smoker with history.  However her  shortness of breath is worsening on exertion.  Patient would likely benefit from stress testing.  Patient has referral placed to vascular surgery for abnormal ABIs noted during this visit as well. -Referral to cardiology for shortness of breath on exertion for possible stress testing  Abnormal ankle brachial index (ABI) When evaluating patient for her leg pain and noted that she did not have palpable pedal pulses.  Obtained point-of-care ABIs in the office which were abnormal and concerning for severe PAD.  Placed referral for vascular surgery as  patient is high risk given long-term smoking history.  No signs of acute limb ischemia on exam. -Patient encouraged to exercise as often as she is possible as her shortness of breath allows -Initiated Lipitor 20 mg -Strict return precautions discussed, patient voiced understanding   [place] Corin Formisano, DO PGY-3, [place]

## 2019-10-04 ENCOUNTER — Ambulatory Visit (HOSPITAL_COMMUNITY): Payer: Self-pay

## 2019-10-05 ENCOUNTER — Encounter: Payer: Self-pay | Admitting: Family Medicine

## 2019-10-05 ENCOUNTER — Ambulatory Visit (HOSPITAL_COMMUNITY)
Admission: RE | Admit: 2019-10-05 | Discharge: 2019-10-05 | Disposition: A | Discharge status: Home / Self Care | Payer: Self-pay | Source: Ambulatory Visit | Attending: Family Medicine | Admitting: Family Medicine

## 2019-10-05 DIAGNOSIS — R634 Abnormal weight loss: Secondary | ICD-10-CM | POA: Insufficient documentation

## 2019-10-05 DIAGNOSIS — K861 Other chronic pancreatitis: Secondary | ICD-10-CM | POA: Insufficient documentation

## 2019-10-05 DIAGNOSIS — K760 Fatty (change of) liver, not elsewhere classified: Secondary | ICD-10-CM | POA: Insufficient documentation

## 2019-10-06 DIAGNOSIS — R6889 Other general symptoms and signs: Secondary | ICD-10-CM | POA: Insufficient documentation

## 2019-10-06 NOTE — Assessment & Plan Note (Addendum)
Has been going on for months however patient is high risk for ischemic causes.  Recent CT chest with no obvious indications for shortness of breath.  Patient is a smoker with history.  However her shortness of breath is worsening on exertion.  Patient would likely benefit from stress testing.  Patient has referral placed to vascular surgery for abnormal ABIs noted during this visit as well. -Referral to cardiology for shortness of breath on exertion for possible stress testing

## 2019-10-06 NOTE — Assessment & Plan Note (Addendum)
Unintentional weight loss, will need to rule out malignancy.  Other causes include worsening liver function given abnormal LFTs and recent hepatic steatosis.  Patient is on HIV nonreactive.  Patient now at 114 pounds.  Patient is a smoker and recent CT chest with no signs of malignancy but does show hepatic steatosis as well as chronic pancreatitis. CT abdomen pelvis obtained 6/7, which shows severe chronic pancreatitis with hepatic steatosis but no signs of masses or concern for malignancy. -Refer to hepatology and have patient follow-up with GI -Patient will get her mammogram -Patient will need Pap smear at follow-up visit, not currently having any abnormal uterine bleeding -Continue to wean prednisone to now 10 mg every other day alternating with 2.5 mg. -Patient also with history of chronic alcohol abuse and has decreased drinking to weekends now but encouraged patient to stop altogether given that this could be contributing to her weight loss

## 2019-10-06 NOTE — Assessment & Plan Note (Addendum)
When evaluating patient for her leg pain and noted that she did not have palpable pedal pulses.  Obtained point-of-care ABIs in the office which were abnormal and concerning for severe PAD.  Placed referral for vascular surgery as patient is high risk given long-term smoking history.  No signs of acute limb ischemia on exam. -Patient encouraged to exercise as often as she is possible as her shortness of breath allows -Initiated Lipitor 20 mg -Patient again encouraged to stop smoking -Strict return precautions discussed, patient voiced understanding

## 2019-10-08 ENCOUNTER — Ambulatory Visit: Payer: Medicaid Other | Admitting: Family Medicine

## 2019-11-01 NOTE — Progress Notes (Deleted)
Cardiology Office Note:   Date:  11/01/2019  NAME:  Christine [name]    MRN: [id number] DOB:  08/30/1970   PCP:  Christine Shave, MD  Cardiologist:  No primary care provider on file.  Electrophysiologist:  None   Referring MD: Christine Rio, MD   No chief complaint on file. ***  History of Present Illness:   Christine [name] is a 49 y.o. female with a hx of chronic pancreatitis, alcohol abuse, candidal esophagitis who is being seen today for the evaluation of SOB at the request of Christine Shave, MD. Evaluated by PCP in the past with normal echo and elevated BNP. Recent chest CT with coronary calcifications. Lungs clear without edema   EGD/colon 02/26/2019 -> Esophageal candidiasis. Normal colon.   Problem List 1. Chronic pancreatitis 2. Tobacco abuse 3. Alcohol abuse  4. Chronic prednisone therapy  5. Candidal esophagitis -HIV negative.  6. Mild coronary calcifications   Past Medical History: Past Medical History:  Diagnosis Date  . Alcohol abuse   . Cellulitis 11/2016   RIGHT ARM  . Chronic pancreatitis ([place])   . Diabetes mellitus   . Esophageal candidiasis ([place]) 03/12/2019  . Hypertension   . Substance abuse [facility])     Past Surgical History: Past Surgical History:  Procedure Laterality Date  . BREAST REDUCTION SURGERY      Current Medications: No outpatient medications have been marked as taking for the 11/02/19 encounter (Appointment) with O'Neal, Cassie Freer, MD.     Allergies:    Patient has no known allergies.   Social History: Social History   Socioeconomic History  . Marital status: Single    Spouse name: Not on file  . Number of children: 3  . Years of education: Not on file  . Highest education level: Not on file  Occupational History  . Occupation: Optician, dispensing  Tobacco Use  . Smoking status: Current Every Day Smoker    Packs/day: 1.00    Years: 22.00    Pack years: 22.00    Types: Cigarettes  . Smokeless tobacco: Never  Used  . Tobacco comment: working on quitting  Vaping Use  . Vaping Use: Never used  Substance and Sexual Activity  . Alcohol use: Yes    Alcohol/week: 0.0 standard drinks    Comment: 11/2016 " i DRINK LESS THAN i USED TO,BUT i DRINK BEER EVERYDAY  . Drug use: Yes    Types: Marijuana    Comment: last use two weeks ago  . Sexual activity: Not on file  Other Topics Concern  . Not on file  Social History Narrative   Single, 2 sons one daughter. She is working as a Optician, dispensing. She is to drink several beers a day but stopped recently. It is September 2014. 4 caffeinated beverages daily.   Social Determinants of Health   Financial Resource Strain:   . Difficulty of Paying Living Expenses:   Food Insecurity:   . Worried About Charity fundraiser in the Last Year:   . Arboriculturist in the Last Year:   Transportation Needs:   . Film/video editor (Medical):   [place] Kitchen Lack of Transportation (Non-Medical):   Physical Activity:   . Days of Exercise per Week:   . Minutes of Exercise per Session:   Stress:   . Feeling of Stress :   Social Connections:   . Frequency of Communication with Friends and Family:   . Frequency of Social Gatherings with  Friends and Family:   . Attends Religious Services:   . Active Member of Clubs or Organizations:   . Attends Archivist Meetings:   [place] Kitchen Marital Status:      Family History: The patient's ***family history is not on file.  ROS:   All other ROS reviewed and negative. Pertinent positives noted in the HPI.     EKGs/Labs/Other Studies Reviewed:   The following studies were personally reviewed by me today:  EKG:  EKG is *** ordered today.  The ekg ordered today demonstrates ***, and was personally reviewed by me.   TTE 08/30/2019 1. Left ventricular ejection fraction, by estimation, is 60 to 65%. The  left ventricle has normal function. The left ventricle has no regional  wall motion abnormalities. There is moderate left  ventricular hypertrophy.  Left ventricular diastolic  parameters are consistent with Grade I diastolic dysfunction (impaired  relaxation).  2. Right ventricular systolic function is normal. The right ventricular  size is normal. Tricuspid regurgitation signal is inadequate for assessing  PA pressure.  3. The mitral valve is normal in structure. No evidence of mitral valve  regurgitation. No evidence of mitral stenosis.  4. The aortic valve is tricuspid. Aortic valve regurgitation is not  visualized. No aortic stenosis is present.  5. The inferior vena cava is normal in size with greater than 50%  respiratory variability, suggesting right atrial pressure of 3 mmHg.   Recent Labs: 07/26/2019: BNP 241.1 09/29/2019: ALT 33; BUN 8; Creatinine, Ser 1.02; Hemoglobin 11.8; Platelets 238; Potassium 4.3; Sodium 142; TSH 1.430   Recent Lipid Panel    Component Value Date/Time   CHOL 138 10/28/2013 1150   TRIG 69 10/28/2013 1150   HDL 62 10/28/2013 1150   CHOLHDL 2.2 10/28/2013 1150   VLDL 14 10/28/2013 1150   LDLCALC 62 10/28/2013 1150   LDLDIRECT 52 07/05/2009 1820    Physical Exam:   VS:  There were no vitals taken for this visit.   Wt Readings from Last 3 Encounters:  10/01/19 114 lb (51.7 kg)  09/29/19 116 lb 3.2 oz (52.7 kg)  07/26/19 131 lb 12.8 oz (59.8 kg)    General: Well nourished, well developed, in no acute distress Heart: Atraumatic, normal size  Eyes: PEERLA, EOMI  Neck: Supple, no JVD Endocrine: No thryomegaly Cardiac: Normal S1, S2; RRR; no murmurs, rubs, or gallops Lungs: Clear to auscultation bilaterally, no wheezing, rhonchi or rales  Abd: Soft, nontender, no hepatomegaly  Ext: No edema, pulses 2+ Musculoskeletal: No deformities, BUE and BLE strength normal and equal Skin: Warm and dry, no rashes   Neuro: Alert and oriented to person, place, time, and situation, CNII-XII grossly intact, no focal deficits  Psych: Normal mood and affect   ASSESSMENT:   Christine [name] is a 49 y.o. female who presents for the following: No diagnosis found.  PLAN:   There are no diagnoses linked to this encounter.  Disposition: No follow-ups on file.  Medication Adjustments/Labs and Tests Ordered: Current medicines are reviewed at length with the patient today.  Concerns regarding medicines are outlined above.  No orders of the defined types were placed in this encounter.  No orders of the defined types were placed in this encounter.   There are no Patient Instructions on file for this visit.   Time Spent with Patient: I have spent a total of *** minutes with patient reviewing hospital notes, telemetry, EKGs, labs and examining the patient as well as establishing an  assessment and plan that was discussed with the patient.  > 50% of time was spent in direct patient care.  Signed, Addison Naegeli. Audie Box, [place]  [address], [place], [place] [address] [phone]  11/01/2019 3:02 PM

## 2019-11-02 ENCOUNTER — Ambulatory Visit: Payer: Self-pay | Admitting: Cardiovascular Disease

## 2019-11-04 ENCOUNTER — Other Ambulatory Visit: Payer: Self-pay | Admitting: *Deleted

## 2019-11-04 DIAGNOSIS — I739 Peripheral vascular disease, unspecified: Secondary | ICD-10-CM

## 2019-11-05 ENCOUNTER — Ambulatory Visit (INDEPENDENT_AMBULATORY_CARE_PROVIDER_SITE_OTHER): Payer: Self-pay | Admitting: Cardiovascular Disease

## 2019-11-05 ENCOUNTER — Other Ambulatory Visit: Payer: Self-pay

## 2019-11-05 ENCOUNTER — Encounter: Payer: Self-pay | Admitting: Cardiovascular Disease

## 2019-11-05 VITALS — BP 122/76 | HR 90 | Ht 69.0 in | Wt 119.8 lb

## 2019-11-05 DIAGNOSIS — R634 Abnormal weight loss: Secondary | ICD-10-CM

## 2019-11-05 DIAGNOSIS — I10 Essential (primary) hypertension: Secondary | ICD-10-CM

## 2019-11-05 DIAGNOSIS — I739 Peripheral vascular disease, unspecified: Secondary | ICD-10-CM

## 2019-11-05 DIAGNOSIS — R0602 Shortness of breath: Secondary | ICD-10-CM

## 2019-11-05 DIAGNOSIS — Z720 Tobacco use: Secondary | ICD-10-CM

## 2019-11-05 DIAGNOSIS — I2510 Atherosclerotic heart disease of native coronary artery without angina pectoris: Secondary | ICD-10-CM

## 2019-11-05 NOTE — Patient Instructions (Signed)
Medication Instructions:  The current medical regimen is effective;  continue present plan and medications.  *If you need a refill on your cardiac medications before your next appointment, please call your pharmacy*   Lab Work: BNP today   If you have labs (blood work) drawn today and your tests are completely normal, you will receive your results only by: [place] Kitchen MyChart Message (if you have MyChart) OR . A paper copy in the mail If you have any lab test that is abnormal or we need to change your treatment, we will call you to review the results.   Follow-Up: At [facility], you and your health needs are our priority.  As part of our continuing mission to provide you with exceptional heart care, we have created designated Provider Care Teams.  These Care Teams include your primary Cardiologist (physician) and Advanced Practice Providers (APPs -  Physician Assistants and Nurse Practitioners) who all work together to provide you with the care you need, when you need it.  We recommend signing up for the patient portal called "MyChart".  Sign up information is provided on this After Visit Summary.  MyChart is used to connect with patients for Virtual Visits (Telemedicine).  Patients are able to view lab/test results, encounter notes, upcoming appointments, etc.  Non-urgent messages can be sent to your provider as well.   To learn more about what you can do with MyChart, go to [email or web address].    Your next appointment:   6 month(s)  The format for your next appointment:   In Person  Provider:   Eleonore Chiquito, MD

## 2019-11-05 NOTE — Progress Notes (Signed)
Cardiology Office Note:   Date:  11/05/2019  NAME:  Christine [name]    MRN: [id number] DOB:  [birth year]   PCP:  Gifford Shave, MD  Cardiologist:  No primary care provider on file.   Referring MD: Leeanne Rio, MD   Chief Complaint  Patient presents with  . Shortness of Breath   History of Present Illness:   Christine [name] is a 49 y.o. female with a hx of diabetes, PAD, tobacco abuse, CAD who is being seen today for the evaluation of shortness of breath at the request of Gifford Shave, MD. Recent evaluation by PCP for fatigue and SOB. Found to have PAD. Has history of chronic pancreatitis and weight loss. On chronic prednisone for rash. BNP recently elevated, 241. Echo unremarkable.   She reports for the last 5 months she has been profoundly short of breath and fatigue.  She reports she is fatigued all day.  She apparently had an elevated BNP and was sent here for evaluation for possible congestive heart failure.  She describes weight loss up to 30 pounds over the past few months.  She reports he is eating well.  She does have chronic pancreatitis and is taking pancreatic enzyme supplementation.  She continues to smoke 1/2 pack and is smoked for nearly 20 years.  There was no COPD seen on recent CT chest.  She did have coronary calcification seen.  Despite this she reports no chest pain or anginal type symptoms.  Recent thyroid studies are normal.  HIV is normal.  She is not diabetic.  She is not anemic.  She has had a recent colonoscopy which showed no colonic mass.  She also had an EGD which showed esophageal candidiasis.  Her EKG today demonstrates normal sinus rhythm with no acute ST-T changes or evidence of prior infarction.  Her echocardiogram was unremarkable with no evidence of right heart failure.  She has no symptoms or evidence of heart failure on examination today.  She does describe some leg pain and reportedly had abnormal ABIs.  She will see vascular surgery next week.  She  has not had an autoimmune work-up.  Her weight loss is a bit concerning.  Her shortness of breath and fatigue seem to coincide with all of this.  Overall her chest CTs and abdominal CTs have been negative for any malignancy.  Problem List 1. Diabetes -A1c 5.9 2. Chronic pancreatitis 3. Chronic prednisone -rash? 4. Tobacco abuse  5. PAD -POC ABI reported as positive  6. CAD -coronary calcifications seen on chest CT  Past Medical History: Past Medical History:  Diagnosis Date  . Alcohol abuse   . Cellulitis 11/2016   RIGHT ARM  . Chronic pancreatitis ([place])   . Diabetes mellitus   . Esophageal candidiasis ([place]) 03/12/2019  . Hypertension   . Substance abuse [facility])     Past Surgical History: Past Surgical History:  Procedure Laterality Date  . BREAST REDUCTION SURGERY      Current Medications: Current Meds  Medication Sig  . Accu-Chek Softclix Lancets lancets Use as instructed  . atorvastatin (LIPITOR) 20 MG tablet Take 1 tablet (20 mg total) by mouth daily.  [place] Kitchen gabapentin (NEURONTIN) 300 MG capsule TAKE 1 CAPSULE BY MOUTH THREE TIMES A DAY (Patient taking differently: Take 300 mg by mouth 3 (three) times daily. TAKE 1 CAPSULE BY MOUTH THREE TIMES A DAY)  . losartan-hydrochlorothiazide (HYZAAR) 50-12.5 MG tablet Take 1 tablet by mouth daily.  . metFORMIN (GLUCOPHAGE) 1000 MG tablet  TAKE 1 TABLET BY MOUTH EVERY DAY WITH BREAKFAST  . predniSONE (DELTASONE) 5 MG tablet Please take 10 mg (2 tablets) every other day, alternating with 2.5 mg (0.5 tablets) every other day for 1 month.     Allergies:    Patient has no known allergies.   Social History: Social History   Socioeconomic History  . Marital status: Single    Spouse name: Not on file  . Number of children: 3  . Years of education: Not on file  . Highest education level: Not on file  Occupational History  . Occupation: Optician, dispensing  Tobacco Use  . Smoking status: Current Every Day Smoker    Packs/day:  1.00    Years: 22.00    Pack years: 22.00    Types: Cigarettes  . Smokeless tobacco: Never Used  . Tobacco comment: working on quitting  Vaping Use  . Vaping Use: Never used  Substance and Sexual Activity  . Alcohol use: Yes    Alcohol/week: 0.0 standard drinks    Comment: 11/2016 " i DRINK LESS THAN i USED TO,BUT i DRINK BEER EVERYDAY  . Drug use: Not Currently    Comment: last use two weeks ago  . Sexual activity: Not on file  Other Topics Concern  . Not on file  Social History Narrative   Single, 2 sons one daughter. She is working as a Optician, dispensing. She is to drink several beers a day but stopped recently. It is September 2014. 4 caffeinated beverages daily.   Social Determinants of Health   Financial Resource Strain:   . Difficulty of Paying Living Expenses:   Food Insecurity:   . Worried About Charity fundraiser in the Last Year:   . Arboriculturist in the Last Year:   Transportation Needs:   . Film/video editor (Medical):   [place] Kitchen Lack of Transportation (Non-Medical):   Physical Activity:   . Days of Exercise per Week:   . Minutes of Exercise per Session:   Stress:   . Feeling of Stress :   Social Connections:   . Frequency of Communication with Friends and Family:   . Frequency of Social Gatherings with Friends and Family:   . Attends Religious Services:   . Active Member of Clubs or Organizations:   . Attends Archivist Meetings:   [place] Kitchen Marital Status:      Family History: The patient's family history is not on file.  ROS:   All other ROS reviewed and negative. Pertinent positives noted in the HPI.     EKGs/Labs/Other Studies Reviewed:   The following studies were personally reviewed by me today:  EKG:  EKG is ordered today.  The ekg ordered today demonstrates normal sinus rhythm, heart rate 82, no acute ST-T changes, no evidence of prior infarct, and was personally reviewed by me.   TTE 08/30/2019 1. Left ventricular ejection  fraction, by estimation, is 60 to 65%. The  left ventricle has normal function. The left ventricle has no regional  wall motion abnormalities. There is moderate left ventricular hypertrophy.  Left ventricular diastolic  parameters are consistent with Grade I diastolic dysfunction (impaired  relaxation).  2. Right ventricular systolic function is normal. The right ventricular  size is normal. Tricuspid regurgitation signal is inadequate for assessing  PA pressure.  3. The mitral valve is normal in structure. No evidence of mitral valve  regurgitation. No evidence of mitral stenosis.  4. The aortic valve is  tricuspid. Aortic valve regurgitation is not  visualized. No aortic stenosis is present.  5. The inferior vena cava is normal in size with greater than 50%  respiratory variability, suggesting right atrial pressure of 3 mmHg.   Recent Labs: 07/26/2019: BNP 241.1 09/29/2019: ALT 33; BUN 8; Creatinine, Ser 1.02; Hemoglobin 11.8; Platelets 238; Potassium 4.3; Sodium 142; TSH 1.430   Recent Lipid Panel    Component Value Date/Time   CHOL 138 10/28/2013 1150   TRIG 69 10/28/2013 1150   HDL 62 10/28/2013 1150   CHOLHDL 2.2 10/28/2013 1150   VLDL 14 10/28/2013 1150   LDLCALC 62 10/28/2013 1150   LDLDIRECT 52 07/05/2009 1820    Physical Exam:   VS:  BP 122/76   Pulse 90   Ht 5\' 9"  (1.753 m)   Wt 119 lb 12.8 oz (54.3 kg)   SpO2 100%   BMI 17.69 kg/m    Wt Readings from Last 3 Encounters:  11/05/19 119 lb 12.8 oz (54.3 kg)  10/01/19 114 lb (51.7 kg)  09/29/19 116 lb 3.2 oz (52.7 kg)    General: Frail, cachexia noted Heart: Atraumatic, normal size  Eyes: PEERLA, EOMI  Neck: Supple, no JVD Endocrine: No thryomegaly Cardiac: Normal S1, S2; RRR; no murmurs, rubs, or gallops Lungs: Clear to auscultation bilaterally, no wheezing, rhonchi or rales  Abd: Soft, nontender, no hepatomegaly  Ext: No edema, diminished pulses on exam Musculoskeletal: No deformities, BUE and BLE  strength normal and equal Skin: Warm and dry, no rashes   Neuro: Alert and oriented to person, place, time, and situation, CNII-XII grossly intact, no focal deficits  Psych: Normal mood and affect   ASSESSMENT:   RAIMI GUILLERMO is a 49 y.o. female who presents for the following: 1. SOB (shortness of breath) on exertion   2. Coronary artery disease involving native coronary artery of native heart without angina pectoris   3. Essential hypertension   4. Tobacco abuse   5. PAD (peripheral artery disease) ([place])   6. Weight loss     PLAN:   1. SOB (shortness of breath) on exertion -Her EKG today demonstrates normal sinus rhythm with no acute ST-T changes to suggest ischemia or prior infarction.  Her recent echocardiogram was normal.  She has no evidence of heart failure on exam.  She has flat JVD and no evidence of lower extremity edema.  She does not have heart failure on exam.  Her elevated BNP could be related to chronic steroid therapy.  She has been tapered.  Repeat BMP today.   -Recent CT of her chest showed coronary calcifications.  This does not explain her fatigue or shortness of breath.  She has no angina. -No emphysematous changes on CT scan.  Could still have COPD.  PFT should be considered. -Weight loss and fatigue are not symptoms of CAD or heart failure.  I do have concerns for some possible etiology we have not discovered.  Recent EGD showed esophageal candidiasis.  HIV was negative.  She did not have a colonic mass and she is not anemic.  Possibly there is some rheumatologic disorder here.  CRP and sed rate were normal a few weeks ago.  She does not describe fevers.  Possibly this needs to be revisited. -Symptoms could be related to chronic steroid therapy.  I do agree with continue to try to get her off this.  Especially as we not found a reason.  The weight loss is still intriguing to me.  I would  expect weight gain with chronic steroid therapy. -She does have chronic  pancreatitis.  She reports she drinks 2 beverages a day.  No cirrhosis on exam.  Possibly this is just related to malnutrition.  I do think gastroenterology needs to reevaluate her.  Especially given his weight loss. -I would not recommend any further cardiac testing such as a stress test until this weight loss and fatigue is resolved.  We did discuss this extensively today.  I will plan to see her back in 6 months after this shortness of breath fatigue and weight loss of the work-up.  We will consider further testing once this has been resolved.  Is clear the symptoms are noncardiac in nature.  2. Coronary artery disease involving native coronary artery of native heart without angina pectoris -Coronary calcification seen on CT scan.  She should continue her Lipitor.  Goal LDL cholesterol is in 70.  This does not explain her weight loss or fatigue.  These are the main symptoms that she reports.  3. Essential hypertension -BP acceptable today.  4. Tobacco abuse -Cessation counseling provided today.  5. PAD (peripheral artery disease) ([place]) -She had abnormal ABIs.  She will see vascular surgery.  I do think that this weight loss fatigue needs to be sorted out before any aggressive interventions are done.  We will work to get her lipids under control.  6. Weight loss -Discussed in details above.  Work-up ongoing.  Disposition: Return in about 6 months (around 05/07/2020).  Medication Adjustments/Labs and Tests Ordered: Current medicines are reviewed at length with the patient today.  Concerns regarding medicines are outlined above.  Orders Placed This Encounter  Procedures  . Brain natriuretic peptide   No orders of the defined types were placed in this encounter.   Patient Instructions  Medication Instructions:  The current medical regimen is effective;  continue present plan and medications.  *If you need a refill on your cardiac medications before your next appointment, please call  your pharmacy*   Lab Work: BNP today   If you have labs (blood work) drawn today and your tests are completely normal, you will receive your results only by: [place] Kitchen MyChart Message (if you have MyChart) OR . A paper copy in the mail If you have any lab test that is abnormal or we need to change your treatment, we will call you to review the results.   Follow-Up: At [facility], you and your health needs are our priority.  As part of our continuing mission to provide you with exceptional heart care, we have created designated Provider Care Teams.  These Care Teams include your primary Cardiologist (physician) and Advanced Practice Providers (APPs -  Physician Assistants and Nurse Practitioners) who all work together to provide you with the care you need, when you need it.  We recommend signing up for the patient portal called "MyChart".  Sign up information is provided on this After Visit Summary.  MyChart is used to connect with patients for Virtual Visits (Telemedicine).  Patients are able to view lab/test results, encounter notes, upcoming appointments, etc.  Non-urgent messages can be sent to your provider as well.   To learn more about what you can do with MyChart, go to [email or web address].    Your next appointment:   6 month(s)  The format for your next appointment:   In Person  Provider:   Eleonore Chiquito, MD        Signed, Addison Naegeli. Audie Box, [place]  [place], [place], [place] [address] [phone]  11/05/2019 12:08 PM

## 2019-11-05 NOTE — Addendum Note (Signed)
Addended by: Laruen Risser C on: 11/05/2019 04:24 PM   Modules accepted: Orders  

## 2019-11-06 LAB — BRAIN NATRIURETIC PEPTIDE: BNP: 116.8 pg/mL — ABNORMAL HIGH (ref 0.0–100.0)

## 2019-11-12 ENCOUNTER — Other Ambulatory Visit: Payer: Self-pay

## 2019-11-12 ENCOUNTER — Ambulatory Visit (HOSPITAL_COMMUNITY)
Admission: RE | Admit: 2019-11-12 | Discharge: 2019-11-12 | Disposition: A | Discharge status: Home / Self Care | Payer: 59 | Source: Ambulatory Visit | Attending: Vascular Surgery | Admitting: Vascular Surgery

## 2019-11-12 ENCOUNTER — Ambulatory Visit (INDEPENDENT_AMBULATORY_CARE_PROVIDER_SITE_OTHER): Payer: 59 | Admitting: Family Medicine

## 2019-11-12 ENCOUNTER — Encounter: Payer: Self-pay | Admitting: Family Medicine

## 2019-11-12 ENCOUNTER — Encounter: Payer: Self-pay | Admitting: Vascular Surgery

## 2019-11-12 ENCOUNTER — Ambulatory Visit (INDEPENDENT_AMBULATORY_CARE_PROVIDER_SITE_OTHER): Payer: 59 | Admitting: Vascular Surgery

## 2019-11-12 VITALS — BP 118/84 | HR 93 | Temp 97.4°F | Resp 20 | Ht 69.0 in | Wt 117.0 lb

## 2019-11-12 DIAGNOSIS — F32A Depression, unspecified: Secondary | ICD-10-CM

## 2019-11-12 DIAGNOSIS — I739 Peripheral vascular disease, unspecified: Secondary | ICD-10-CM | POA: Diagnosis not present

## 2019-11-12 DIAGNOSIS — K860 Alcohol-induced chronic pancreatitis: Secondary | ICD-10-CM | POA: Diagnosis not present

## 2019-11-12 DIAGNOSIS — F329 Major depressive disorder, single episode, unspecified: Secondary | ICD-10-CM | POA: Diagnosis not present

## 2019-11-12 MED ORDER — FLUOXETINE HCL 20 MG PO CAPS: 20.0000 mg | ORAL_CAPSULE | Freq: Every day | ORAL | 0 refills | Status: DC

## 2019-11-12 MED ORDER — VARENICLINE TARTRATE 1 MG PO TABS
1.0000 mg | ORAL_TABLET | Freq: Two times a day (BID) | ORAL | 1 refills | Status: DC
Start: 2019-11-12 — End: 2019-12-08

## 2019-11-12 MED ORDER — ASPIRIN EC 81 MG PO TBEC: 81.0000 mg | DELAYED_RELEASE_TABLET | Freq: Every day | ORAL | 11 refills | Status: DC

## 2019-11-12 MED ORDER — ROSUVASTATIN CALCIUM 10 MG PO TABS: 10.0000 mg | ORAL_TABLET | Freq: Every day | ORAL | 5 refills | Status: DC

## 2019-11-12 MED ORDER — PANCRELIPASE (LIP-PROT-AMYL) 36000-114000 UNITS PO CPEP: 36000.0000 [IU] | ORAL_CAPSULE | Freq: Three times a day (TID) | ORAL | 0 refills | Status: DC

## 2019-11-12 NOTE — Addendum Note (Signed)
Addended by: Geralynn Rile on: 11/12/2019 06:09 AM   Modules accepted: Orders

## 2019-11-12 NOTE — Patient Instructions (Signed)
It was a pleasure to meet you today.  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  I recommend decreasing the amount of swelling you are doing on your hair.  I feel like increasing her nutritional status will also help make stronger hair.  I am prescribing you Creon for your chronic pancreatitis.  Regarding her depression I am going to prescribe you Prozac (fluoxetine) 20 mg daily.  I would like to see you in 1 month for follow-up to see how your depression is doing as well as seeing how your hair loss is doing.  If you have any questions or concerns please feel free to call our clinic.  I hope you have a wonderful afternoon!  Alopecia Areata, Adult  Alopecia areata is a condition that causes you to lose hair. You may lose hair on your scalp in patches. In some cases, you may lose all the hair on your scalp (alopecia totalis) or all the hair from your face and body (alopecia universalis). Alopecia areata is an autoimmune disease. This means that your body's defense system (immune system) mistakes normal parts of the body for germs or other things that can make you sick. When you have alopecia areata, the immune system attacks the hair follicles. Alopecia areata usually develops in childhood, but it can develop at any age. For some people, their hair grows back on its own and hair loss does not happen again. For others, their hair may fall out and grow back in cycles. The hair loss may last many years. Having this condition can be emotionally difficult, but it is not dangerous. What are the causes? The cause of this condition is not known. What increases the risk? This condition is more likely to develop in people who have:  A family history of alopecia.  A family history of another autoimmune disease, including type 1 diabetes and rheumatoid arthritis.  Asthma and allergies.  Down syndrome. What are the signs or symptoms? Round spots of patchy hair loss on  the scalp is the main symptom of this condition. The spots may be mildly itchy. Other symptoms include:  Short dark hairs in the bald patches that are wider at the top (exclamation point hairs).  Dents, white spots, or lines in the fingernails or toenails.  Balding and body hair loss. This is rare. How is this diagnosed? This condition is diagnosed based on your symptoms and family history. Your health care provider will also check your scalp skin, teeth, and nails. Your health care provider may refer you to a specialist in hair and skin disorders (dermatologist). You may also have tests, including:  A hair pull test.  Blood tests or other screening tests to check for autoimmune diseases, such as thyroid disease or diabetes.  Skin biopsy to confirm the diagnosis.  A procedure to examine the skin with a lighted magnifying instrument (dermoscopy). How is this treated? There is no cure for alopecia areata. Treatment is aimed at promoting the regrowth of hair and preventing the immune system from overreacting. No single treatment is right for all people with alopecia areata. It depends on the type of hair loss you have and how severe it is. Work with your health care provider to find the best treatment for you. Treatment may include:  Having regular checkups to make sure the condition is not getting worse (watchful waiting).  Steroid creams or pills for 6-8 weeks to stop the immune reaction and help hair  to regrow more quickly.  Other topical medicines to alter the immune system response and support the hair growth cycle.  Steroid injections.  Therapy and counseling with a support group or therapist if you are having trouble coping with hair loss. Follow these instructions at home:  Learn as much as you can about your condition.  Apply topical creams only as told by your health care provider.  Take over-the-counter and prescription medicines only as told by your health care  provider.  Consider getting a wig or products to make hair look fuller or to cover bald spots, if you feel uncomfortable with your appearance.  Get therapy or counseling if you are having a hard time coping with hair loss. Ask your health care provider to recommend a counselor or support group.  Keep all follow-up visits as told by your health care provider. This is important. Contact a health care provider if:  Your hair loss gets worse, even with treatment.  You have new symptoms.  You are struggling emotionally. Summary  Alopecia areata is an autoimmune condition that makes your body's defense system (immune system) attack the hair follicles. This causes you to lose hair.  Treatments may include regular checkups to make sure that the condition is not getting worse (watchful waiting), medicines, and steroid injections. This information is not intended to replace advice given to you by your health care provider. Make sure you discuss any questions you have with your health care provider. Document Revised: 03/28/2017 Document Reviewed: 05/03/2016 Elsevier Patient Education  2020 [employer].

## 2019-11-12 NOTE — Progress Notes (Signed)
    SUBJECTIVE:   CHIEF COMPLAINT / HPI:   Patient is here for hair loss and to meet her needs PCP  Hair loss Patient reports that her hair loss has been going on for "months" but she has noticed a big increase in the hair loss over the last few weeks.  She feels like her hair is pulling on the sides and she has little spots of no hair on her head.  Reports that she wears a wig all the time and she pulls her hair back with a wig.  Weight loss Patient's weight has been stable over the last month but she is progressively lost weight over the last year.  She has had a full work-up and for this and says that she was taking pancreatic enzymes for her chronic pancreatitis which she attributes to some weight loss but she has stopped taking it because she could not afford it.  She now has insurance and would like to be restarted on those medications.   PERTINENT  PMH / PSH: Chronic pancreatitis, EtOH abuse, diabetes, protein calorie malnutrition  OBJECTIVE:   BP 108/64   Pulse (!) 104   Ht 5\' 9"  (1.753 m)   Wt 120 lb (54.4 kg)   SpO2 100%   BMI 17.72 kg/m   General: Extremely malnourished, chronically ill patient HEENT: Patient has very thin hair with patches of hair loss predominantly on the sides and one spot on her occiput Respiratory: Normal work of breathing, lungs are clear to auscultation bilaterally Cardiac: Regular rate and rhythm, no murmurs appreciated Abdomen: Flat, soft, nontender to palpation   ASSESSMENT/PLAN:   Chronic alcoholic  pancreatitis Patient reports that she was prescribed medication for the pancreatitis but that she could not afford it because she did not have insurance.  She now has insurance and would like to restart this medication.  She has been unable to gain weight without the medications.  Patient admits to drinking 2-12 ounce beers daily at this time. -Patient should follow-up with gastroenterology -Prescribed Creon  Depression Patient reports  increased depression recently because her of her medical illnesses.  PHQ-9 was 14 from 17 at last visit.  Denies any SI or HI. -Prescribed Prozac 20 mg daily -Follow-up in 1 month to assess symptoms -Repeat PHQ-9 at follow-up   Gifford Shave, MD [place]

## 2019-11-12 NOTE — Progress Notes (Signed)
Patient ID: Christine [name], female   DOB: 1970-08-21, 49 y.o.   MRN: [id number]  Reason for Consult: New Patient (Initial Visit)   Referred by Shirley, [place], DO  Subjective:     HPI:  Christine [name] is a 49 y.o. female history of diabetes, hypertension and everyday smoking.  Denies any family history of vascular disease.  Denies any previous history of stroke TIA or amaurosis and does not have any coronary artery disease herself.  No family history of abdominal aortic aneurysm.  States that when she walks up steps she has to stop due to leg pain particularly in the calves.  She can otherwise walk at least 5 minutes without stopping.  She denies any tissue loss or ulceration.  She has never had any vascular invention.  Past Medical History:  Diagnosis Date   Alcohol abuse    Cellulitis 11/2016   RIGHT ARM   Chronic pancreatitis ([place])    Diabetes mellitus    Esophageal candidiasis ([place]) 03/12/2019   Hypertension    Substance abuse ([place])    History reviewed. No pertinent family history. Past Surgical History:  Procedure Laterality Date   BREAST REDUCTION SURGERY      Short Social History:  Social History   Tobacco Use   Smoking status: Current Every Day Smoker    Packs/day: 1.00    Years: 22.00    Pack years: 22.00    Types: Cigarettes   Smokeless tobacco: Never Used   Tobacco comment: working on quitting  Substance Use Topics   Alcohol use: Yes    Alcohol/week: 0.0 standard drinks    Comment: 11/2016 " i DRINK LESS THAN i USED TO,BUT i DRINK BEER EVERYDAY    No Known Allergies  Current Outpatient Medications  Medication Sig Dispense Refill   Accu-Chek Softclix Lancets lancets Use as instructed 100 each 12   atorvastatin (LIPITOR) 20 MG tablet Take 1 tablet (20 mg total) by mouth daily. 90 tablet 3   gabapentin (NEURONTIN) 300 MG capsule TAKE 1 CAPSULE BY MOUTH THREE TIMES A DAY (Patient taking differently: Take 300 mg by mouth 3 (three) times daily.  TAKE 1 CAPSULE BY MOUTH THREE TIMES A DAY) 90 capsule 1   losartan-hydrochlorothiazide (HYZAAR) 50-12.5 MG tablet Take 1 tablet by mouth daily. 30 tablet 1   metFORMIN (GLUCOPHAGE) 1000 MG tablet TAKE 1 TABLET BY MOUTH EVERY DAY WITH BREAKFAST 30 tablet 5   predniSONE (DELTASONE) 5 MG tablet Please take 10 mg (2 tablets) every other day, alternating with 2.5 mg (0.5 tablets) every other day for 1 month. 45 tablet 0   No current facility-administered medications for this visit.    Review of Systems  Constitutional: Positive for unexpected weight change.  HENT: HENT negative.  Eyes: Eyes negative.  Respiratory: Respiratory negative.  Cardiovascular: Positive for claudication and leg swelling.  Musculoskeletal: Positive for leg pain.  Skin: Positive for rash.  Neurological: Neurological negative. Hematologic: Hematologic/lymphatic negative.  Psychiatric: Positive for depressed mood.        Objective:  Objective   Vitals:   11/12/19 1131  BP: 118/84  Pulse: 93  Resp: 20  Temp: (!) 97.4 F (36.3 C)  SpO2: 98%  Weight: 117 lb (53.1 kg)  Height: 5\' 9"  (1.753 m)   Body mass index is 17.28 kg/m.  Physical Exam HENT:     Head: Normocephalic.     Nose:     Comments: Wearing a mask Eyes:     Pupils: Pupils are  equal, round, and reactive to light.  Neck:     Vascular: No carotid bruit.  Cardiovascular:     Rate and Rhythm: Normal rate.     Pulses:          Radial pulses are 2+ on the right side and 1+ on the left side.       Femoral pulses are 2+ on the right side and 2+ on the left side.      Popliteal pulses are 0 on the right side and 0 on the left side.  Pulmonary:     Effort: Pulmonary effort is normal.     Breath sounds: Normal breath sounds.  Abdominal:     General: Abdomen is flat.     Palpations: Abdomen is soft.  Musculoskeletal:        General: Normal range of motion.  Skin:    General: Skin is warm and dry.     Capillary Refill: Capillary refill takes  2 to 3 seconds.  Neurological:     General: No focal deficit present.     Mental Status: She is alert.  Psychiatric:        Mood and Affect: Mood normal.        Behavior: Behavior normal.        Thought Content: Thought content normal.        Judgment: Judgment normal.     Data: I have independently turbid her ABIs to be 0.77 right and 0.73 left with toe pressure 103 on the right and 95 on the left.  Monophasic waveforms bilaterally.     Assessment/Plan:     49 year old female with claudication.  She continues to smoke we have discussed cessation and I have sent Chantix to her pharmacy today.  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  Does not have any carotid bruits has never had any symptoms from this.  She does not have any tissue loss or ulceration at this time.  I recommended continued walking daily and also baby aspirin and Crestor daily.  She will follow-up in 6 months with repeat ABIs.     Waynetta Sandy MD [facility]

## 2019-11-15 NOTE — Assessment & Plan Note (Signed)
Patient reports increased depression recently because her of her medical illnesses.  PHQ-9 was 14 from 17 at last visit.  Denies any SI or HI. -Prescribed Prozac 20 mg daily -Follow-up in 1 month to assess symptoms -Repeat PHQ-9 at follow-up

## 2019-11-15 NOTE — Assessment & Plan Note (Addendum)
Patient reports that she was prescribed medication for the pancreatitis but that she could not afford it because she did not have insurance.  She now has insurance and would like to restart this medication.  She has been unable to gain weight without the medications.  Patient admits to drinking 2-12 ounce beers daily at this time. -Patient should follow-up with gastroenterology -Prescribed Creon

## 2019-11-16 ENCOUNTER — Other Ambulatory Visit: Payer: Self-pay

## 2019-11-16 ENCOUNTER — Ambulatory Visit
Admission: RE | Admit: 2019-11-16 | Discharge: 2019-11-16 | Disposition: A | Discharge status: Home / Self Care | Payer: 59 | Source: Ambulatory Visit | Attending: Family Medicine | Admitting: Family Medicine

## 2019-11-16 DIAGNOSIS — R5383 Other fatigue: Secondary | ICD-10-CM

## 2019-11-16 DIAGNOSIS — Z1231 Encounter for screening mammogram for malignant neoplasm of breast: Secondary | ICD-10-CM

## 2019-11-17 ENCOUNTER — Other Ambulatory Visit: Payer: Self-pay | Admitting: *Deleted

## 2019-11-17 DIAGNOSIS — I739 Peripheral vascular disease, unspecified: Secondary | ICD-10-CM

## 2019-11-25 ENCOUNTER — Ambulatory Visit: Payer: Self-pay | Admitting: Cardiovascular Disease

## 2019-12-05 ENCOUNTER — Other Ambulatory Visit: Payer: Self-pay | Admitting: Family Medicine

## 2019-12-05 ENCOUNTER — Other Ambulatory Visit: Payer: Self-pay

## 2019-12-05 ENCOUNTER — Emergency Department (HOSPITAL_COMMUNITY)
Admission: EM | Admit: 2019-12-05 | Discharge: 2019-12-05 | Disposition: A | Discharge status: Home / Self Care | Payer: 59 | Attending: Emergency Medicine | Admitting: Emergency Medicine

## 2019-12-05 ENCOUNTER — Emergency Department (HOSPITAL_COMMUNITY): Payer: 59

## 2019-12-05 ENCOUNTER — Encounter (HOSPITAL_COMMUNITY): Payer: Self-pay | Admitting: Emergency Medicine

## 2019-12-05 DIAGNOSIS — R000 Tachycardia, unspecified: Secondary | ICD-10-CM | POA: Insufficient documentation

## 2019-12-05 DIAGNOSIS — E119 Type 2 diabetes mellitus without complications: Secondary | ICD-10-CM | POA: Insufficient documentation

## 2019-12-05 DIAGNOSIS — R079 Chest pain, unspecified: Secondary | ICD-10-CM | POA: Diagnosis not present

## 2019-12-05 DIAGNOSIS — D649 Anemia, unspecified: Secondary | ICD-10-CM

## 2019-12-05 DIAGNOSIS — Z20822 Contact with and (suspected) exposure to covid-19: Secondary | ICD-10-CM | POA: Insufficient documentation

## 2019-12-05 DIAGNOSIS — R519 Headache, unspecified: Secondary | ICD-10-CM | POA: Diagnosis not present

## 2019-12-05 DIAGNOSIS — R05 Cough: Secondary | ICD-10-CM | POA: Insufficient documentation

## 2019-12-05 DIAGNOSIS — I10 Essential (primary) hypertension: Secondary | ICD-10-CM | POA: Diagnosis not present

## 2019-12-05 DIAGNOSIS — Z7984 Long term (current) use of oral hypoglycemic drugs: Secondary | ICD-10-CM | POA: Diagnosis not present

## 2019-12-05 DIAGNOSIS — Z79899 Other long term (current) drug therapy: Secondary | ICD-10-CM | POA: Diagnosis not present

## 2019-12-05 DIAGNOSIS — F17210 Nicotine dependence, cigarettes, uncomplicated: Secondary | ICD-10-CM | POA: Diagnosis not present

## 2019-12-05 DIAGNOSIS — Z7982 Long term (current) use of aspirin: Secondary | ICD-10-CM | POA: Insufficient documentation

## 2019-12-05 DIAGNOSIS — D72819 Decreased white blood cell count, unspecified: Secondary | ICD-10-CM

## 2019-12-05 LAB — CBC
HCT: 28.6 % — ABNORMAL LOW (ref 36.0–46.0)
Hemoglobin: 9.3 g/dL — ABNORMAL LOW (ref 12.0–15.0)
MCH: 30.8 pg (ref 26.0–34.0)
MCHC: 32.5 g/dL (ref 30.0–36.0)
MCV: 94.7 fL (ref 80.0–100.0)
Platelets: 212 10*3/uL (ref 150–400)
RBC: 3.02 MIL/uL — ABNORMAL LOW (ref 3.87–5.11)
RDW: 14.2 % (ref 11.5–15.5)
WBC: 2.6 10*3/uL — ABNORMAL LOW (ref 4.0–10.5)
nRBC: 0 % (ref 0.0–0.2)

## 2019-12-05 LAB — BASIC METABOLIC PANEL
Anion gap: 14 (ref 5–15)
BUN: 7 mg/dL (ref 6–20)
CO2: 21 mmol/L — ABNORMAL LOW (ref 22–32)
Calcium: 8.4 mg/dL — ABNORMAL LOW (ref 8.9–10.3)
Chloride: 109 mmol/L (ref 98–111)
Creatinine, Ser: 0.87 mg/dL (ref 0.44–1.00)
GFR calc Af Amer: >60 mL/min (ref >60)
GFR calc non Af Amer: >60 mL/min (ref >60)
Glucose, Bld: 154 mg/dL — ABNORMAL HIGH (ref 70–99)
Potassium: 3.8 mmol/L (ref 3.5–5.1)
Sodium: 144 mmol/L (ref 135–145)

## 2019-12-05 LAB — TROPONIN I (HIGH SENSITIVITY)
Troponin I (High Sensitivity): 3 ng/L (ref <18)
Troponin I (High Sensitivity): 4 ng/L (ref <18)

## 2019-12-05 LAB — D-DIMER, QUANTITATIVE: D-Dimer, Quant: 0.42 ug/mL-FEU (ref 0.00–0.50)

## 2019-12-05 LAB — SARS CORONAVIRUS 2 BY RT PCR (HOSPITAL ORDER, PERFORMED IN ~~LOC~~ HOSPITAL LAB): SARS Coronavirus 2: NEGATIVE

## 2019-12-05 MED ORDER — SODIUM CHLORIDE 0.9% FLUSH
3.0000 mL | Freq: Once | INTRAVENOUS | Status: AC
  Administered 2019-12-05: 3 mL via INTRAVENOUS

## 2019-12-05 MED ORDER — ACETAMINOPHEN 500 MG PO TABS
1000.0000 mg | ORAL_TABLET | Freq: Once | ORAL | Status: AC
  Administered 2019-12-05: 1000 mg via ORAL
  Filled 2019-12-05: qty 2

## 2019-12-05 NOTE — ED Notes (Signed)
Dr. Zavitz notified of Calcium 6.4 

## 2019-12-05 NOTE — ED Provider Notes (Signed)
[facility] EMERGENCY DEPARTMENT Provider Note   CSN: [id number] Arrival date & time: 12/05/19  1506     History Chief Complaint  Patient presents with  . Chest Pain    Christine [name] is a 49 y.o. female.  Patient with history of pancreatitis, diabetes, substance abuse, alcohol abuse presents with upper chest pain worse on the right and mild neck pain with it.  No injuries.  No neurologic symptoms.  No history of heart problems or blood clots.  No recent surgeries.  No exertional symptoms.  Patient received aspirin, fluids by EMS.  Patient's had 40 pounds of weight loss over the past 3 to 4 months and is following closely with primary doctor.  No blood in the stools.  Patient does use cigarettes.  Patient is also had mild cough nonproductive and headache.        Past Medical History:  Diagnosis Date  . Alcohol abuse   . Cellulitis 11/2016   RIGHT ARM  . Chronic pancreatitis ([place])   . Diabetes mellitus   . Esophageal candidiasis ([place]) 03/12/2019  . Hypertension   . Substance abuse [facility])     Patient Active Problem List   Diagnosis Date Noted  . Abnormal ankle brachial index (ABI) 10/06/2019  . Shortness of breath 07/15/2019  . Tachycardia 07/15/2019  . Long term (current) use of systemic steroids 05/12/2019  . Esophageal candidiasis ([place]) 03/12/2019  . Fatigue 12/14/2018  . Chronic cough 07/28/2018  . Alcohol use 03/30/2018  . Abnormal weight loss 01/13/2017  . Chronic contact dermatitis 11/19/2016  . 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 Vitamin D deficiency 11/01/2013  . Essential hypertension, benign 12/25/2012  . Chronic alcoholic  pancreatitis 40/98/1191  . Protein-calorie malnutrition, severe ([place]) 11/18/2012  . Depression 09/29/2012  . Neuropathic pain 01/29/2011  . DRUG ABUSE, HX OF 07/05/2009  . ANEMIA 09/28/2008  . HYPERLIPIDEMIA 09/12/2008  . Tobacco use disorder 09/12/2008  . Diabetes mellitus with  neuropathy ([place]) 06/26/2006    Past Surgical History:  Procedure Laterality Date  . BREAST REDUCTION SURGERY       OB History   No obstetric history on file.     No family history on file.  Social History   Tobacco Use  . Smoking status: Current Every Day Smoker    Packs/day: 1.00    Years: 22.00    Pack years: 22.00    Types: Cigarettes  . Smokeless tobacco: Never Used  . Tobacco comment: working on quitting  Vaping Use  . Vaping Use: Never used  Substance Use Topics  . Alcohol use: Yes    Alcohol/week: 0.0 standard drinks    Comment: 11/2016 " i DRINK LESS THAN i USED TO,BUT i DRINK BEER EVERYDAY  . Drug use: Not Currently    Comment: last use two weeks ago    Home Medications Prior to Admission medications   Medication Sig Start Date End Date Taking? Authorizing Provider  Accu-Chek Softclix Lancets lancets Use as instructed 07/29/19   Shirley, [place], DO  aspirin EC 81 MG tablet Take 1 tablet (81 mg total) by mouth daily. Swallow whole. 11/12/19   Waynetta Sandy, MD  atorvastatin (LIPITOR) 20 MG tablet Take 1 tablet (20 mg total) by mouth daily. 10/01/19   Shirley, [place], DO  FLUoxetine (PROZAC) 20 MG capsule Take 1 capsule (20 mg total) by mouth daily. 11/12/19   Gifford Shave, MD  gabapentin (NEURONTIN) 300 MG capsule TAKE 1 CAPSULE BY MOUTH THREE TIMES  A DAY Patient taking differently: Take 300 mg by mouth 3 (three) times daily. TAKE 1 CAPSULE BY MOUTH THREE TIMES A DAY 05/10/19   Shirley, [place], DO  lipase/protease/amylase (CREON) 36000 UNITS CPEP capsule Take 1 capsule (36,000 Units total) by mouth 3 (three) times daily before meals. 11/12/19   Gifford Shave, MD  losartan-hydrochlorothiazide (HYZAAR) 50-12.5 MG tablet Take 1 tablet by mouth daily. 05/10/19   Shirley, [place], DO  metFORMIN (GLUCOPHAGE) 1000 MG tablet TAKE 1 TABLET BY MOUTH EVERY DAY WITH BREAKFAST 07/26/19   Shirley, [place], DO  predniSONE (DELTASONE) 5 MG tablet Please take 10 mg (2  tablets) every other day, alternating with 2.5 mg (0.5 tablets) every other day for 1 month. 07/19/19   Shirley, [place], DO  rosuvastatin (CRESTOR) 10 MG tablet Take 1 tablet (10 mg total) by mouth daily. 11/12/19   Waynetta Sandy, MD  varenicline (CHANTIX CONTINUING MONTH PAK) 1 MG tablet Take 1 tablet (1 mg total) by mouth 2 (two) times daily. 11/12/19   Waynetta Sandy, MD    Allergies    Patient has no known allergies.  Review of Systems   Review of Systems  Constitutional: Positive for unexpected weight change. Negative for chills and fever.  HENT: Negative for congestion.   Eyes: Negative for visual disturbance.  Respiratory: Positive for cough. Negative for shortness of breath.   Cardiovascular: Positive for chest pain.  Gastrointestinal: Negative for abdominal pain and vomiting.  Genitourinary: Negative for dysuria and flank pain.  Musculoskeletal: Negative for back pain, neck pain and neck stiffness.  Skin: Negative for rash.  Neurological: Positive for headaches. Negative for light-headedness.    Physical Exam Updated Vital Signs BP 125/76 (BP Location: Right Arm)   Pulse (!) 106   Temp 98.4 F (36.9 C) (Oral)   Resp 16   Ht 5\' 9"  (1.753 m)   Wt 56.7 kg   SpO2 99%   BMI 18.46 kg/m   Physical Exam Vitals and nursing note reviewed.  Constitutional:      Appearance: Ill appearance: weight loss clinically.  HENT:     Head: Normocephalic and atraumatic.  Eyes:     General:        Right eye: No discharge.        Left eye: No discharge.     Conjunctiva/sclera: Conjunctivae normal.  Neck:     Trachea: No tracheal deviation.  Cardiovascular:     Rate and Rhythm: Regular rhythm. Tachycardia present.  Pulmonary:     Effort: Pulmonary effort is normal.     Breath sounds: Normal breath sounds.  Abdominal:     General: There is no distension.     Palpations: Abdomen is soft.     Tenderness: There is no abdominal tenderness. There is no guarding.    Musculoskeletal:     Cervical back: Normal range of motion and neck supple.     Right lower leg: No edema.     Left lower leg: No edema.  Skin:    General: Skin is warm.     Findings: No rash.  Neurological:     Mental Status: She is alert and oriented to person, place, and time.     ED Results / Procedures / Treatments   Labs (all labs ordered are listed, but only abnormal results are displayed) Labs Reviewed  BASIC METABOLIC PANEL - Abnormal; Notable for the following components:      Result Value   CO2 21 (*)    Glucose, Bld  154 (*)    Calcium 8.4 (*)    All other components within normal limits  CBC - Abnormal; Notable for the following components:   WBC 2.6 (*)    RBC 3.02 (*)    Hemoglobin 9.3 (*)    HCT 28.6 (*)    All other components within normal limits  SARS CORONAVIRUS 2 BY RT PCR (HOSPITAL ORDER, [facility])  D-DIMER, QUANTITATIVE (NOT AT [facility])  TROPONIN I (HIGH SENSITIVITY)  TROPONIN I (HIGH SENSITIVITY)    EKG EKG Interpretation  Date/Time:  Sunday December 05 2019 15:24:51 EDT Ventricular Rate:  106 PR Interval:  176 QRS Duration: 76 QT Interval:  358 QTC Calculation: 475 R Axis:   68 Text Interpretation: Sinus tachycardia Right atrial enlargement Septal infarct , age undetermined Abnormal ECG overall similar to prevoius Reconfirmed by Elnora Morrison [phone]) on 12/05/2019 8:08:28 PM   Radiology DG Chest 2 View  Result Date: 12/05/2019 CLINICAL DATA:  Upper chest pain.  Short of breath EXAM: CHEST - 2 VIEW COMPARISON:  None. FINDINGS: Normal mediastinum and cardiac silhouette. Normal pulmonary vasculature. No evidence of effusion, infiltrate, or pneumothorax. No acute bony abnormality. IMPRESSION: Normal chest radiograph Electronically Signed   By: Suzy Bouchard M.D.   On: 12/05/2019 16:31    Procedures Procedures (including critical care time)  Medications Ordered in ED Medications  sodium chloride flush (NS) 0.9 %  injection 3 mL (3 mLs Intravenous Given 12/05/19 1853)  acetaminophen (TYLENOL) tablet 1,000 mg (1,000 mg Oral Given 12/05/19 1859)    ED Course  I have reviewed the triage vital signs and the nursing notes.  Pertinent labs & imaging results that were available during my care of the patient were reviewed by me and considered in my medical decision making (see chart for details).    MDM Rules/Calculators/A&P                          Patient presents with intermittent chest pain, headache and cough since earlier today.  No cardiac or blood clot history.  Patient has a few risk factors, no significant chest pain during examination.  Concern for significant weight loss and patient is followed closely outpatient.  Reviewed medical records and patient had CT chest without acute abnormality in addition to CT abdomen pelvis without acute findings June 8 and second respectively.  Patient also has chronic pancreatitis history which may be playing a role in the weight loss. Patient overall is low risk with atypical symptoms and no cardiac history, delta troponin negative.  EKG overall similar to previous mild tachycardia.  D-dimer negative patient is low risk.  Patient stable for continued outpatient follow-up.  Blood work reviewed and showed hemoglobin decreased to 9 point and white blood cell count decreased to 2.6 from 3.9 as well. With two cell lines decreased pt needs continued fup with pcp and hematology referral.    Final Clinical Impression(s) / ED Diagnoses Final diagnoses:  Acute chest pain    Rx / DC Orders ED Discharge Orders    None       Elnora Morrison, MD 12/05/19 2021

## 2019-12-05 NOTE — ED Triage Notes (Signed)
Pt to triage via [facility].  Reports chest pain x 2 hours with headache and dizziness.  Reports non-productive cough x 2 days.  EMS administered ASA 324mg .  BP initially 100/60.  Pt received NS 250 cc and last BP 116/74.  18g IV LFA.  Also reports 40-50 lb weight loss over the past 3-4 months with no known cause.  Pt has seen PCP due to weight loss.

## 2019-12-05 NOTE — ED Notes (Signed)
Calcium 6.4   Elnora Morrison, MD 12/05/19 [phone]

## 2019-12-05 NOTE — Discharge Instructions (Addendum)
Schedule an appointment with a local doctor for reassessment early this week.  Return for persistent or new chest pain, new concerns. Use Tylenol every 4 hours as needed for pain.  Follow-up your Covid test result on my chart.

## 2019-12-07 ENCOUNTER — Other Ambulatory Visit: Payer: Self-pay | Admitting: *Deleted

## 2019-12-07 MED ORDER — GABAPENTIN 300 MG PO CAPS: ORAL_CAPSULE | ORAL | 1 refills | Status: DC

## 2019-12-07 NOTE — Telephone Encounter (Signed)
Requesting for authorization for a 90 day supply. Christine [name], CMA

## 2019-12-08 ENCOUNTER — Other Ambulatory Visit: Payer: Self-pay | Admitting: Nurse Practitioner

## 2019-12-08 ENCOUNTER — Other Ambulatory Visit: Payer: Self-pay | Admitting: Vascular Surgery

## 2019-12-08 DIAGNOSIS — R748 Abnormal levels of other serum enzymes: Secondary | ICD-10-CM

## 2019-12-14 ENCOUNTER — Ambulatory Visit (INDEPENDENT_AMBULATORY_CARE_PROVIDER_SITE_OTHER): Payer: 59 | Admitting: Family Medicine

## 2019-12-14 ENCOUNTER — Other Ambulatory Visit: Payer: Self-pay

## 2019-12-14 ENCOUNTER — Encounter: Payer: Self-pay | Admitting: Family Medicine

## 2019-12-14 VITALS — BP 104/68 | HR 53 | Wt 116.6 lb

## 2019-12-14 DIAGNOSIS — R636 Underweight: Secondary | ICD-10-CM

## 2019-12-14 DIAGNOSIS — F329 Major depressive disorder, single episode, unspecified: Secondary | ICD-10-CM

## 2019-12-14 DIAGNOSIS — F17209 Nicotine dependence, unspecified, with unspecified nicotine-induced disorders: Secondary | ICD-10-CM | POA: Diagnosis not present

## 2019-12-14 DIAGNOSIS — F17200 Nicotine dependence, unspecified, uncomplicated: Secondary | ICD-10-CM | POA: Diagnosis not present

## 2019-12-14 DIAGNOSIS — K860 Alcohol-induced chronic pancreatitis: Secondary | ICD-10-CM

## 2019-12-14 DIAGNOSIS — F32A Depression, unspecified: Secondary | ICD-10-CM

## 2019-12-14 NOTE — Assessment & Plan Note (Signed)
Patient reports depression is doing better since she started on Prozac.  PHQ-9 today is 8 from 14 at previous visit.  Denies any SI or HI. -Continue Prozac 20 mg daily -Repeat PHQ 9 at each visit

## 2019-12-14 NOTE — Assessment & Plan Note (Signed)
Patient's weight at this visit was 116.6 pounds down from 120.5 at last visit.  BMI is 17.22.  Patient reports she is just not very hungry.  She does have chronic pancreatitis from alcoholism and was started on Creon at last visit.  She is being followed by gastroenterology.  Discussed high-protein diet and encouraged increased diet.  Patient drinks an Ensure when she does not eat meals but that is not daily.  Recommended daily Ensure. -Coupon given for Ensure -Sample pack given for Ensure -We will continue to monitor weights -Follow-up with gastroenterology

## 2019-12-14 NOTE — Progress Notes (Signed)
    SUBJECTIVE:   CHIEF COMPLAINT / HPI:   Follow-up visit  Depression Patient was started on Prozac at her last visit.  Reports that Prozac has been helping and she feels much better about how she is doing.  PHQ 9 at last visit was 14.  Denies any thoughts of self-harm or harming others.  Would like to continue taking her medications.  Chronic pancreatitis Patient is currently on Creon and is continuing to take this.  Reports that her stomach pain has improved and she feels better since her previous visit.  Alcohol use Patient reports she has decreased her alcohol use to 1-12 ounce beer daily from 2.  She also reports that she will go some days without drinking at all.  Congratulated her on her improvement and encouraged continue to work.  Patient says that she saw a liver doctor recently and that they ordered further testing and imaging studies.  I am unable to see those notes or studies.  Tobacco use Patient is continuing on Chantix 1 mg daily and reports that it has helped with the cravings.  She is down to 4 cigarettes a day and is going to continue on working on cessation.   OBJECTIVE:   BP 104/68   Pulse (!) 53   Wt 116 lb 9.6 oz (52.9 kg)   SpO2 100%   BMI 17.22 kg/m   General: Extremely underweight but otherwise well-appearing, no acute distress Respiratory: Normal work of breathing, lungs clear to auscultation bilaterally Abdomen: Soft, nontender, positive bowel sounds MSK: Cachectic hands, no crepitus noted in either knee  ASSESSMENT/PLAN:   Underweight Patient's weight at this visit was 116.6 pounds down from 120.5 at last visit.  BMI is 17.22.  Patient reports she is just not very hungry.  She does have chronic pancreatitis from alcoholism and was started on Creon at last visit.  She is being followed by gastroenterology.  Discussed high-protein diet and encouraged increased diet.  Patient drinks an Ensure when she does not eat meals but that is not daily.   Recommended daily Ensure. -Coupon given for Ensure -Sample pack given for Ensure -We will continue to monitor weights -Follow-up with gastroenterology  Tobacco use disorder Patient has cut cigarette usage down to 4 cigarettes daily.  She is on Chantix which she says helps with cravings. -Continue Chantix -Continue to monitor smoking and encourage cessation  Chronic alcoholic  pancreatitis Patient reports good compliance with the Creon that was prescribed.  She also says that she followed up with gastroenterology but has not gotten any results from the test that she got done. -Continue Creon -Continue to follow with gastroenterology  Depression Patient reports depression is doing better since she started on Prozac.  PHQ-9 today is 8 from 14 at previous visit.  Denies any SI or HI. -Continue Prozac 20 mg daily -Repeat PHQ 9 at each visit     Gifford Shave, MD [place]

## 2019-12-14 NOTE — Assessment & Plan Note (Signed)
Patient has cut cigarette usage down to 4 cigarettes daily.  She is on Chantix which she says helps with cravings. -Continue Chantix -Continue to monitor smoking and encourage cessation

## 2019-12-14 NOTE — Patient Instructions (Addendum)
It was wonderful to see you today.  I am glad you are feeling better regarding your depression!  I would like for you to continue taking the Prozac as prescribed.  Regarding your weight concerns and strength concerns I would like you to drink an Ensure daily on top of what ever meals you are consuming.  The weakness is most likely related to your dietary needs.  Regarding your drinking congratulations on decreasing the amount you are drinking!  Continue the strong work.  Also congratulations on decreasing the amount of smoking, if you would like any further help please let us know.  Please make sure you follow-up with the stomach doctors regarding any further testing they would like.  If you have any issues, questions or concerns please feel free to call our clinic and schedule appointment.  I hope you have a wonderful afternoon!   Protein Content in Foods  Generally, most healthy people need around 50 grams of protein each day. Depending on your overall health, you may need more or less protein in your diet. Talk to your health care provider or dietitian about how much protein you need. See the following list for the protein content of some common foods. High-protein foods High-protein foods contain 4 grams (4 g) or more of protein per serving. They include:  Beef, ground sirloin (cooked) -- 3 oz have 24 g of protein.  Cheese (hard) -- 1 oz has 7 g of protein.  Chicken breast, boneless and skinless (cooked) -- 3 oz have 13.4 g of protein.  Cottage cheese -- 1/2 cup has 13.4 g of protein.  Egg -- 1 egg has 6 g of protein.  Fish, filet (cooked) -- 1 oz has 6-7 g of protein.  Garbanzo beans (canned or cooked) -- 1/2 cup has 6-7 g of protein.  Kidney beans (canned or cooked) -- 1/2 cup has 6-7 g of protein.  Lamb (cooked) -- 3 oz has 24 g of protein.  Milk -- 1 cup (8 oz) has 8 g of protein.  Nuts (peanuts, pistachios, almonds) -- 1 oz has 6 g of protein.  Peanut butter -- 1 oz has 7-8 g  of protein.  Pork tenderloin (cooked) -- 3 oz has 18.4 g of protein.  Pumpkin seeds -- 1 oz has 8.5 g of protein.  Soybeans (roasted) -- 1 oz has 8 g of protein.  Soybeans (cooked) -- 1/2 cup has 11 g of protein.  Soy milk -- 1 cup (8 oz) has 5-10 g of protein.  Soy or vegetable patty -- 1 patty has 11 g of protein.  Sunflower seeds -- 1 oz has 5.5 g of protein.  Tofu (firm) -- 1/2 cup has 20 g of protein.  Tuna (canned in water) -- 3 oz has 20 g of protein.  Yogurt -- 6 oz has 8 g of protein. Low-protein foods Low-protein foods contain 3 grams (3 g) or less of protein per serving. They include:  Beets (raw or cooked) -- 1/2 cup has 1.5 g of protein.  Bran cereal -- 1/2 cup has 2-3 g of protein.  Bread -- 1 slice has 2.5 g of protein.  Broccoli (raw or cooked) -- 1/2 cup has 2 g of protein.  Collard greens (raw or cooked) -- 1/2 cup has 2 g of protein.  Corn (fresh or cooked) -- 1/2 cup has 2 g of protein.  Cream cheese -- 1 oz has 2 g of protein.  Creamer (half-and-half) -- 1 oz has 1  g of protein.  Flour tortilla -- 1 tortilla has 2.5 g of protein  Frozen yogurt -- 1/2 cup has 3 g of protein.  Fruit or vegetable juice -- 1/2 cup has 1 g of protein.  Green beans (raw or cooked) -- 1/2 cup has 1 g of protein.  Green peas (canned) -- 1/2 cup has 3.5 g of protein.  Muffins -- 1 small muffin (2 oz) has 3 g of protein.  Oatmeal (cooked) -- 1/2 cup has 3 g of protein.  Potato (baked with skin) -- 1 medium potato has 3 g of protein.  Rice (cooked) -- 1/2 cup has 2.5-3.5 g of protein.  Sour cream -- 1/2 cup has 2.5 g of protein.  Spinach (cooked) -- 1/2 cup has 3 g of protein.  Squash (cooked) -- 1/2 cup has 1.5 g of protein. Actual amounts of protein may be different depending on processing. Talk with your health care provider or dietitian about what foods are recommended for you. This information is not intended to replace advice given to you by your health  care provider. Make sure you discuss any questions you have with your health care provider. Document Revised: 12/25/2015 Document Reviewed: 12/25/2015 Elsevier Patient Education  2020 [employer].

## 2019-12-14 NOTE — Assessment & Plan Note (Signed)
Patient reports good compliance with the Creon that was prescribed.  She also says that she followed up with gastroenterology but has not gotten any results from the test that she got done. -Continue Creon -Continue to follow with gastroenterology

## 2019-12-23 ENCOUNTER — Ambulatory Visit
Admission: RE | Admit: 2019-12-23 | Discharge: 2019-12-23 | Disposition: A | Discharge status: Home / Self Care | Payer: 59 | Source: Ambulatory Visit | Attending: Nurse Practitioner | Admitting: Nurse Practitioner

## 2019-12-23 DIAGNOSIS — R748 Abnormal levels of other serum enzymes: Secondary | ICD-10-CM

## 2020-01-04 ENCOUNTER — Other Ambulatory Visit: Payer: Self-pay | Admitting: Family Medicine

## 2020-01-17 ENCOUNTER — Other Ambulatory Visit: Payer: Self-pay

## 2020-01-17 MED ORDER — PREDNISONE 5 MG PO TABS: ORAL_TABLET | ORAL | 0 refills | Status: DC

## 2020-01-17 NOTE — Telephone Encounter (Signed)
Patient calls nurse line requesting refill on prednisone. Patient states that red, itchy bumps have came back on legs. States that prednisone helped with this discomfort.   To PCP  Please advise.   Talbot Grumbling, RN

## 2020-01-18 ENCOUNTER — Other Ambulatory Visit: Payer: Self-pay

## 2020-01-18 ENCOUNTER — Ambulatory Visit (INDEPENDENT_AMBULATORY_CARE_PROVIDER_SITE_OTHER): Payer: 59

## 2020-01-18 DIAGNOSIS — Z111 Encounter for screening for respiratory tuberculosis: Secondary | ICD-10-CM

## 2020-01-18 NOTE — Progress Notes (Signed)
Patient is here for a PPD placement.  It was placed on 01/18/2020 in the left  forearm @ 1:52 pm.    Scheduled patient nurse visit for PPD read on Thursday, 9/23 at 2:00 p.m.    Talbot Grumbling, RN

## 2020-01-19 ENCOUNTER — Other Ambulatory Visit: Payer: Self-pay | Admitting: Nurse Practitioner

## 2020-01-19 DIAGNOSIS — D376 Neoplasm of uncertain behavior of liver, gallbladder and bile ducts: Secondary | ICD-10-CM

## 2020-01-20 ENCOUNTER — Other Ambulatory Visit: Payer: Self-pay

## 2020-01-20 ENCOUNTER — Ambulatory Visit: Payer: 59

## 2020-01-20 DIAGNOSIS — Z111 Encounter for screening for respiratory tuberculosis: Secondary | ICD-10-CM

## 2020-01-20 LAB — TB SKIN TEST
Induration: 0 mm
TB Skin Test: NEGATIVE

## 2020-01-20 NOTE — Progress Notes (Signed)
PPD Reading Note  PPD read and results entered in EpicCare.  Result: 0 mm induration.  Interpretation: Negative  Allergic reaction: no

## 2020-02-04 ENCOUNTER — Encounter: Payer: Self-pay | Admitting: Family Medicine

## 2020-02-04 ENCOUNTER — Other Ambulatory Visit: Payer: Self-pay

## 2020-02-04 ENCOUNTER — Ambulatory Visit (INDEPENDENT_AMBULATORY_CARE_PROVIDER_SITE_OTHER): Payer: 59 | Admitting: Family Medicine

## 2020-02-04 VITALS — BP 120/72 | HR 74 | Wt 118.0 lb

## 2020-02-04 DIAGNOSIS — D649 Anemia, unspecified: Secondary | ICD-10-CM

## 2020-02-04 DIAGNOSIS — R5383 Other fatigue: Secondary | ICD-10-CM | POA: Diagnosis not present

## 2020-02-04 DIAGNOSIS — D619 Aplastic anemia, unspecified: Secondary | ICD-10-CM

## 2020-02-04 DIAGNOSIS — F32A Depression, unspecified: Secondary | ICD-10-CM

## 2020-02-04 MED ORDER — LOSARTAN POTASSIUM-HCTZ 50-12.5 MG PO TABS: 1.0000 | ORAL_TABLET | Freq: Every day | ORAL | 1 refills | Status: DC

## 2020-02-04 NOTE — Assessment & Plan Note (Signed)
Patient's depression continues to be improved from prior to starting Prozac. PHQ-9 today was 10 from 8 at previous visit. Denies any SI or HI at this time -Continue Prozac 20 mg daily -Monitor PHQ-9's at each visit

## 2020-02-04 NOTE — Progress Notes (Signed)
    SUBJECTIVE:   CHIEF COMPLAINT / HPI:   Fatigue  Patient reports continued fatigue since her previous visit. She has been drinking her boost daily and felt like her fatigue should be resolving. We discussed her lab work from her previous visit and that she was anemic with a hemoglobin of 9.3. Denies any obvious signs of bleeding. Patient reports that she sleeps approximately 10 hours in a 24-hour period but she will only sleep for 2-3 hours at a time intermittently. We discussed sleep hygiene and patient reports she will work on this. Patient knowledges that she drinks 1-24 ounce can of beer daily.   Ear pain  Right ear was hurting but now on left side. Denies any trauma to either ear. Denies any changes in hearing just reports that her ears feel full.   OBJECTIVE:   BP 120/72   Pulse 74   Wt 118 lb (53.5 kg)   BMI 17.43 kg/m   General: Cachectic appearing chronically ill 49 year old female Cardiac: Regular rate and rhythm, no murmurs appreciated Respiratory: Normal work of breathing, lungs clear to auscultation bilaterally HEENT: Tympanic membranes intact bilaterally, mild amount of erythema and external auditory canals bilaterally but does not look infected Abdomen: Flat, soft, nontender Psych: Relatively upbeat and mood. Reports that her depression is doing better than it was before.  PHQ9 SCORE ONLY 02/04/2020 12/14/2019 12/14/2019  PHQ-9 Total Score 10 8 8      ASSESSMENT/PLAN:   Fatigue Patient reports continued fatigue although she is now eating/drinking more than she was previously. This could be related to her chronic steroid use and she is down to 5 mg every other day. She reported that her leg started to break out again so she needed an refill on the prescription although she had discontinued them. LFTs were mildly elevated in June so we will recheck those today with a CMP. Patient was also anemic at the previous visit with a hemoglobin of 9.3. We will recheck hemoglobin  with a CBC today. TSH checked earlier this year was normal with a mildly low free T4 at 0.78. TSH being rechecked today. Patient is currently drinking 24 ounces of beer daily. We will check a vitamin B12 and folate. Follow-up in 1 month to determine next episode evaluation.  Depression Patient's depression continues to be improved from prior to starting Prozac. PHQ-9 today was 10 from 8 at previous visit. Denies any SI or HI at this time -Continue Prozac 20 mg daily -Monitor PHQ-9's at each visit     Gifford Shave, MD [place]

## 2020-02-04 NOTE — Patient Instructions (Signed)
It was wonderful to see you today.  I am sorry you are still having these issues with fatigue.  I am going to collect some lab work to look for sources of your anemia which can cause fatigue as well as investigate other possible causes for the fatigue.  I would like to see you back in 1 month.  I will call you with these results and send any supplementation that you may need into your pharmacy.  If you have any questions or concerns or any issues please feel free to call our clinic.  I hope you have a wonderful afternoon!

## 2020-02-04 NOTE — Assessment & Plan Note (Signed)
Patient reports continued fatigue although she is now eating/drinking more than she was previously. This could be related to her chronic steroid use and she is down to 5 mg every other day. She reported that her leg started to break out again so she needed an refill on the prescription although she had discontinued them. LFTs were mildly elevated in June so we will recheck those today with a CMP. Patient was also anemic at the previous visit with a hemoglobin of 9.3. We will recheck hemoglobin with a CBC today. TSH checked earlier this year was normal with a mildly low free T4 at 0.78. TSH being rechecked today. Patient is currently drinking 24 ounces of beer daily. We will check a vitamin B12 and folate. Follow-up in 1 month to determine next episode evaluation.

## 2020-02-05 ENCOUNTER — Other Ambulatory Visit: Payer: Self-pay | Admitting: Family Medicine

## 2020-02-05 LAB — COMPREHENSIVE METABOLIC PANEL
ALT: 10 IU/L (ref 0–32)
AST: 27 IU/L (ref 0–40)
Albumin/Globulin Ratio: 1 — ABNORMAL LOW (ref 1.2–2.2)
Albumin: 3.4 g/dL — ABNORMAL LOW (ref 3.8–4.8)
Alkaline Phosphatase: 170 IU/L — ABNORMAL HIGH (ref 44–121)
BUN/Creatinine Ratio: 9 (ref 9–23)
BUN: 8 mg/dL (ref 6–24)
Bilirubin Total: <0.2 mg/dL (ref 0.0–1.2)
CO2: 17 mmol/L — ABNORMAL LOW (ref 20–29)
Calcium: 9.1 mg/dL (ref 8.7–10.2)
Chloride: 102 mmol/L (ref 96–106)
Creatinine, Ser: 0.89 mg/dL (ref 0.57–1.00)
GFR calc Af Amer: 88 mL/min/{1.73_m2} (ref >59)
GFR calc non Af Amer: 76 mL/min/{1.73_m2} (ref >59)
Globulin, Total: 3.3 g/dL (ref 1.5–4.5)
Glucose: 143 mg/dL — ABNORMAL HIGH (ref 65–99)
Potassium: 6 mmol/L — ABNORMAL HIGH (ref 3.5–5.2)
Sodium: 140 mmol/L (ref 134–144)
Total Protein: 6.7 g/dL (ref 6.0–8.5)

## 2020-02-05 LAB — CBC
Hematocrit: 31.1 % — ABNORMAL LOW (ref 34.0–46.6)
Hemoglobin: 10.2 g/dL — ABNORMAL LOW (ref 11.1–15.9)
MCH: 30.4 pg (ref 26.6–33.0)
MCHC: 32.8 g/dL (ref 31.5–35.7)
MCV: 93 fL (ref 79–97)
Platelets: 475 10*3/uL — ABNORMAL HIGH (ref 150–450)
RBC: 3.35 x10E6/uL — ABNORMAL LOW (ref 3.77–5.28)
RDW: 13.5 % (ref 11.7–15.4)
WBC: 7.3 10*3/uL (ref 3.4–10.8)

## 2020-02-05 LAB — FERRITIN: Ferritin: 166 ng/mL — ABNORMAL HIGH (ref 15–150)

## 2020-02-05 LAB — VITAMIN B12: Vitamin B-12: 468 pg/mL (ref 232–1245)

## 2020-02-05 LAB — TSH: TSH: 0.963 u[IU]/mL (ref 0.450–4.500)

## 2020-02-05 LAB — FOLATE: Folate: 17.2 ng/mL (ref >3.0)

## 2020-02-07 ENCOUNTER — Telehealth: Payer: Self-pay | Admitting: Family Medicine

## 2020-02-07 MED ORDER — LOKELMA 10 G PO PACK: 10.0000 g | PACK | Freq: Every day | ORAL | 0 refills | Status: AC

## 2020-02-07 NOTE — Addendum Note (Signed)
Addended by: Concepcion Living on: 02/07/2020 12:20 PM   Modules accepted: Orders

## 2020-02-07 NOTE — Telephone Encounter (Signed)
Called patient regarding her lab results.  Informed her of her hemoglobin being low but improved from previous check.  Also discussed her hyperkalemia of 6.0.  Prescribed 2 days worth of Lokelma to the patient and scheduled a visit for her on Wednesday 10/13 at 2:30 PM.  Patient is agreeable and will show up then.  She needs a repeat BMP to check her potassium and may need further doses of Lokelma.

## 2020-02-08 ENCOUNTER — Ambulatory Visit
Admission: RE | Admit: 2020-02-08 | Discharge: 2020-02-08 | Disposition: A | Discharge status: Home / Self Care | Payer: 59 | Source: Ambulatory Visit | Attending: Nurse Practitioner | Admitting: Nurse Practitioner

## 2020-02-08 ENCOUNTER — Other Ambulatory Visit: Payer: Self-pay

## 2020-02-08 DIAGNOSIS — D376 Neoplasm of uncertain behavior of liver, gallbladder and bile ducts: Secondary | ICD-10-CM

## 2020-02-08 MED ORDER — GADOBENATE DIMEGLUMINE 529 MG/ML IV SOLN
10.0000 mL | Freq: Once | INTRAVENOUS | Status: AC | PRN
  Administered 2020-02-08: 10 mL via INTRAVENOUS

## 2020-02-09 ENCOUNTER — Ambulatory Visit (HOSPITAL_COMMUNITY)
Admission: RE | Admit: 2020-02-09 | Discharge: 2020-02-09 | Disposition: A | Discharge status: Home / Self Care | Payer: 59 | Source: Ambulatory Visit | Attending: Family Medicine | Admitting: Family Medicine

## 2020-02-09 ENCOUNTER — Ambulatory Visit: Payer: 59

## 2020-02-09 ENCOUNTER — Other Ambulatory Visit (HOSPITAL_COMMUNITY): Payer: Self-pay | Admitting: Family Medicine

## 2020-02-09 ENCOUNTER — Ambulatory Visit (INDEPENDENT_AMBULATORY_CARE_PROVIDER_SITE_OTHER): Payer: 59 | Admitting: Family Medicine

## 2020-02-09 VITALS — BP 140/70 | HR 85 | Ht 69.0 in | Wt 127.5 lb

## 2020-02-09 DIAGNOSIS — F17200 Nicotine dependence, unspecified, uncomplicated: Secondary | ICD-10-CM

## 2020-02-09 DIAGNOSIS — E876 Hypokalemia: Secondary | ICD-10-CM | POA: Diagnosis not present

## 2020-02-09 DIAGNOSIS — E875 Hyperkalemia: Secondary | ICD-10-CM | POA: Insufficient documentation

## 2020-02-09 HISTORY — DX: Hyperkalemia: E87.5

## 2020-02-09 MED FILL — SM NICOTINE 2 MG CHEWING GU: 2 | 10 days supply | Qty: 110 | Fill #0

## 2020-02-09 MED FILL — NICOTINE 14 MG/24HR PATCH: 14 | 14 days supply | Qty: 14 | Fill #0

## 2020-02-09 NOTE — Assessment & Plan Note (Addendum)
Patient with hyperkalemia to 6 on 10/8.  Status post Lokelma x2.  Obtained EKG today shows mildly enlarged T waves to 6 mm in V3 V4 V5 and aVF compared to last EKG in August of this year.  Will use this EKG as a baseline.  Unclear cause for hyperkalemia as patient is on medications that cause hypokalemia, particularly HCTZ and Hyzaar.  Will follow with BMP today, will closely follow for results.  If potassium persistently elevated, will likely give more Lokelma, and have patient follow-up with PCP.  However unclear as to etiology, especially with no hyperkalemia in the past.  Return precautions given.

## 2020-02-09 NOTE — Progress Notes (Signed)
    SUBJECTIVE:   CHIEF COMPLAINT / HPI: high potassium  Already 49-year-old female patient of Dr. Erlinda Hong presents today for follow-up for hyperkalemia to 6.0 on October 8.  She took 2 days of Lokelma, 1 packet each on 10/11, 10/12.  She denies any other symptoms of palpitations, chest pain, dizziness, lightheadedness, no infectious symptoms.  We went through medication list, patient is not on any medications to cause hyperkalemia.  She is on Hyzaar and previously on Chantix.  Both only cause hypokalemia.  We will repeat BMP today to check for potassium level.  She states that she has stopped taking Chantix due to the recall.  And is requesting other nicotine cessation presents as she has decreased her cigarette use to about 5 to 10/day, but not quit entirely.  Would like both nicotine patch and gum.  PERTINENT  PMH / PSH: Diabetes  OBJECTIVE:   BP 140/70   Pulse 85   Ht 5\' 9"  (1.753 m)   Wt 127 lb 8 oz (57.8 kg)   SpO2 99%   BMI 18.83 kg/m   Physical Exam Vitals and nursing note reviewed.  Constitutional:      General: She is not in acute distress.    Appearance: Normal appearance. She is normal weight. She is not ill-appearing, toxic-appearing or diaphoretic.  HENT:     Head: Normocephalic and atraumatic.  Cardiovascular:     Rate and Rhythm: Normal rate and regular rhythm.     Pulses: Normal pulses.     Heart sounds: Normal heart sounds.  Pulmonary:     Effort: Pulmonary effort is normal.     Breath sounds: Normal breath sounds.  Abdominal:     General: Abdomen is flat. Bowel sounds are normal.     Palpations: Abdomen is soft.  Musculoskeletal:     Right lower leg: No edema.     Left lower leg: No edema.  Skin:    General: Skin is warm and dry.  Neurological:     General: No focal deficit present.     Mental Status: She is alert. Mental status is at baseline.  Psychiatric:        Mood and Affect: Mood normal.        Behavior: Behavior normal.    ASSESSMENT/PLAN:     Hyperkalemia Patient with hyperkalemia to 6 on 10/8.  Status post Lokelma x2.  Obtained EKG today shows mildly enlarged T waves to 6 mm in V3 V4 V5 and aVF compared to last EKG in August of this year.  Will use this EKG as a baseline.  Unclear cause for hyperkalemia as patient is on medications that cause hypokalemia, particularly HCTZ and Hyzaar.  Will follow with BMP today, will closely follow for results.  If potassium persistently elevated, will likely give more Lokelma, and have patient follow-up with PCP.  However unclear as to etiology, especially with no hyperkalemia in the past.  Return precautions given.  Tobacco use disorder Patient stopped taking Chantix after recall.  Will send prescription for nicotine patch and gum.  Encouraged to quit.  Will have patient follow-up with PCP.     Gladys Damme, MD [place]

## 2020-02-09 NOTE — Patient Instructions (Signed)
It was a pleasure to see you today!  We are getting an ekg and checking your blood again today for the potassium level. I will let you know the results as soon as they return.  If you have any symptoms of chest pain, funny beats in your heart, shortness of breath, dizziness or lightheadedness, please call our office [phone] and/or go to the emergency department.  I also gave you a prescription for nicotine gum and patches. Please let us know if you need refills!  Be Well,  Dr. Chauncey Reading

## 2020-02-09 NOTE — Assessment & Plan Note (Signed)
Patient stopped taking Chantix after recall.  Will send prescription for nicotine patch and gum.  Encouraged to quit.  Will have patient follow-up with PCP.

## 2020-02-10 LAB — BASIC METABOLIC PANEL
BUN/Creatinine Ratio: 8 — ABNORMAL LOW (ref 9–23)
BUN: 6 mg/dL (ref 6–24)
CO2: 22 mmol/L (ref 20–29)
Calcium: 8.9 mg/dL (ref 8.7–10.2)
Chloride: 106 mmol/L (ref 96–106)
Creatinine, Ser: 0.79 mg/dL (ref 0.57–1.00)
GFR calc Af Amer: 102 mL/min/{1.73_m2} (ref >59)
GFR calc non Af Amer: 88 mL/min/{1.73_m2} (ref >59)
Glucose: 186 mg/dL — ABNORMAL HIGH (ref 65–99)
Potassium: 4.2 mmol/L (ref 3.5–5.2)
Sodium: 141 mmol/L (ref 134–144)

## 2020-02-11 ENCOUNTER — Telehealth (HOSPITAL_COMMUNITY): Payer: Self-pay | Admitting: Family Medicine

## 2020-02-11 NOTE — Telephone Encounter (Signed)
Potassium WNL from most recent BMP, called to inform patient. She asked about recent MRI results. Patient had MR abdomen ordered to assess for liver abnormalities, unable to determine initial ordering service. No hepatic abnormalities noted, but possible pneumonia. Patient denies fever, has had baseline cough for many years as she is a current tobacco user. Since no signs of acute infectious symptoms, will refrain from antibiotic treatment at this time. MR recommends CT chest and clinical correlation. Recommend that patient schedule another appointment to see Dr. Caron Presume within the next 2-3 weeks or sooner if infectious symptoms develop.  Gladys Damme, MD [facility], PGY-2

## 2020-03-01 ENCOUNTER — Other Ambulatory Visit: Payer: Self-pay | Admitting: Family Medicine

## 2020-03-06 ENCOUNTER — Other Ambulatory Visit: Payer: Self-pay | Admitting: Family Medicine

## 2020-03-29 ENCOUNTER — Other Ambulatory Visit: Payer: Self-pay | Admitting: Family Medicine

## 2020-04-11 ENCOUNTER — Ambulatory Visit: Payer: 59 | Admitting: Family Medicine

## 2020-04-13 ENCOUNTER — Other Ambulatory Visit: Payer: Self-pay | Admitting: Family Medicine

## 2020-04-26 ENCOUNTER — Encounter: Payer: Self-pay | Admitting: Family Medicine

## 2020-04-26 ENCOUNTER — Other Ambulatory Visit: Payer: Self-pay

## 2020-04-26 ENCOUNTER — Ambulatory Visit (INDEPENDENT_AMBULATORY_CARE_PROVIDER_SITE_OTHER): Payer: 59 | Admitting: Family Medicine

## 2020-04-26 VITALS — BP 124/72 | HR 86 | Ht 69.0 in | Wt 133.6 lb

## 2020-04-26 DIAGNOSIS — Z23 Encounter for immunization: Secondary | ICD-10-CM | POA: Diagnosis not present

## 2020-04-26 DIAGNOSIS — Z794 Long term (current) use of insulin: Secondary | ICD-10-CM

## 2020-04-26 DIAGNOSIS — E1140 Type 2 diabetes mellitus with diabetic neuropathy, unspecified: Secondary | ICD-10-CM

## 2020-04-26 LAB — POCT GLYCOSYLATED HEMOGLOBIN (HGB A1C): Hemoglobin A1C: 6.5 % — AB (ref 4.0–5.6)

## 2020-04-26 NOTE — Patient Instructions (Addendum)
It was great seeing you today!  Your hemoglobin A1c is slightly increased from the previous check at 6.5 but you are still at goal so I want to continue you on your current regimen for your diabetes medication which is Metformin 1000 mg daily.  I would like to see you in 6 months for a recheck on your hemoglobin A1c.  Regarding your drinking I recommend you continue to work towards completely stopping.  If there is anything I can do to help please let me know.  I hope you have a wonderful afternoon and a happy new year.

## 2020-04-26 NOTE — Progress Notes (Signed)
    SUBJECTIVE:   CHIEF COMPLAINT / HPI:   Diabetes checkup  Patient reports that her blood sugars have been a little bit more elevated than normal but that that they are not too bad.  Feels like her hemoglobin A1c will be in the 6 or 7 range.  She is gaining weight and is up to 133 pounds.  Most recent hemoglobin A1c was 5.9 on 09/29/2019.  Denies any signs or symptoms of hypoglycemia.  EtOH abuse Patient reports that her mother passed away in late 02/17/2023 and since then she started drinking again.  She initially was drinking 2-24 ounce beers 3-4 times a week.  She has cut back to 1-2 times a week and is continuing to work on cessation.  PERTINENT  PMH / PSH: Chronic pancreatitis   OBJECTIVE:   BP 124/72   Pulse 86   Ht 5\' 9"  (1.753 m)   Wt 133 lb 9.6 oz (60.6 kg)   SpO2 99%   BMI 19.73 kg/m   General: Well-appearing, no acute distress, cachectic 49 year old female Cardiac: Regular rate and rhythm, no murmurs appreciated Respiratory breathing, lungs clear to auscultation bilaterally Abdomen: Soft, nontender, positive bowel sounds Extremities: No lower extremity edema Psych: Normal mood and affect, patient reports she feels a little down due to the death of her mother but is grieving appropriately.  Denies any SI or HI  ASSESSMENT/PLAN:   Diabetes mellitus with neuropathy (HCC) Most recent hemoglobin A1c was 5.9.  Approximately 6 months ago.  Hemoglobin A1c today was increased to 6.5.  Currently on Metformin 1000 mg daily. -Continue on Metformin 1000 mg daily -Follow-up in 6 months for repeat hemoglobin A1c or sooner if needed -Strict return precautions given regarding hyper and hypoglycemia     54, MD [facility]

## 2020-04-27 NOTE — Assessment & Plan Note (Signed)
Most recent hemoglobin A1c was 5.9.  Approximately 6 months ago.  Hemoglobin A1c today was increased to 6.5.  Currently on Metformin 1000 mg daily. -Continue on Metformin 1000 mg daily -Follow-up in 6 months for repeat hemoglobin A1c or sooner if needed -Strict return precautions given regarding hyper and hypoglycemia

## 2020-04-30 NOTE — Progress Notes (Deleted)
Cardiology Office Note:   Date:  04/30/2020  NAME:  Christine [name]    MRN: [id number] DOB:  05-15-1970   PCP:  Derrel Nip, MD  Cardiologist:  No primary care provider on file.  Electrophysiologist:  None   Referring MD: Derrel Nip, MD   No chief complaint on file. ***  History of Present Illness:   Christine [name] is a 50 y.o. female with a hx of chronic pancreatitis/etoh abuse, tobacco abuse, PAD, CAD who presents for follow-up of SOB. She was seen last summer with complaints of weight loss and fatigue. She had no angina. She was sent to me for elevated BNP but no clinical evidence of heart failure.    Problem List 1. Diabetes -A1c 6.5 2. Chronic pancreatitis/alcohol abuse  3. Chronic prednisone -rash? 4. Tobacco abuse  5. PAD -R ABI 0.77 -L ABI 0.73 6. CAD -coronary calcifications seen on chest CT  Past Medical History: Past Medical History:  Diagnosis Date  . Alcohol abuse   . Cellulitis 11/2016   RIGHT ARM  . Chronic pancreatitis (HCC)   . Diabetes mellitus   . Esophageal candidiasis (HCC) 03/12/2019  . Hypertension   . Substance abuse [facility])     Past Surgical History: Past Surgical History:  Procedure Laterality Date  . BREAST REDUCTION SURGERY      Current Medications: No outpatient medications have been marked as taking for the 05/01/20 encounter (Appointment) with O'Neal, Ronnald Ramp, MD.     Allergies:    Patient has no known allergies.   Social History: Social History   Socioeconomic History  . Marital status: Single    Spouse name: Not on file  . Number of children: 3  . Years of education: Not on file  . Highest education level: Not on file  Occupational History  . Occupation: Engineer, agricultural  Tobacco Use  . Smoking status: Current Every Day Smoker    Packs/day: 1.00    Years: 22.00    Pack years: 22.00    Types: Cigarettes  . Smokeless tobacco: Never Used  . Tobacco comment: working on quitting  Vaping Use  .  Vaping Use: Never used  Substance and Sexual Activity  . Alcohol use: Yes    Alcohol/week: 0.0 standard drinks    Comment: 11/2016 " i DRINK LESS THAN i USED TO,BUT i DRINK BEER EVERYDAY  . Drug use: Not Currently    Comment: last use two weeks ago  . Sexual activity: Not on file  Other Topics Concern  . Not on file  Social History Narrative   Single, 2 sons one daughter. She is working as a Engineer, agricultural. She is to drink several beers a day but stopped recently. It is September 2014. 4 caffeinated beverages daily.   Social Determinants of Health   Financial Resource Strain: Not on file  Food Insecurity: Not on file  Transportation Needs: Not on file  Physical Activity: Not on file  Stress: Not on file  Social Connections: Not on file     Family History: The patient's ***family history is not on file.  ROS:   All other ROS reviewed and negative. Pertinent positives noted in the HPI.     EKGs/Labs/Other Studies Reviewed:   The following studies were personally reviewed by me today:  EKG:  EKG is *** ordered today.  The ekg ordered today demonstrates ***, and was personally reviewed by me.   TTE 08/30/2019   1. Left ventricular ejection fraction, by  estimation, is 60 to 65%. The  left ventricle has normal function. The left ventricle has no regional  wall motion abnormalities. There is moderate left ventricular hypertrophy.  Left ventricular diastolic  parameters are consistent with Grade I diastolic dysfunction (impaired  relaxation).  2. Right ventricular systolic function is normal. The right ventricular  size is normal. Tricuspid regurgitation signal is inadequate for assessing  PA pressure.  3. The mitral valve is normal in structure. No evidence of mitral valve  regurgitation. No evidence of mitral stenosis.  4. The aortic valve is tricuspid. Aortic valve regurgitation is not  visualized. No aortic stenosis is present.  5. The inferior vena cava is  normal in size with greater than 50%  respiratory variability, suggesting right atrial pressure of 3 mmHg.   Recent Labs: 11/05/2019: BNP 116.8 02/04/2020: ALT 10; Hemoglobin 10.2; Platelets 475; TSH 0.963 02/09/2020: BUN 6; Creatinine, Ser 0.79; Potassium 4.2; Sodium 141   Recent Lipid Panel    Component Value Date/Time   CHOL 138 10/28/2013 1150   TRIG 69 10/28/2013 1150   HDL 62 10/28/2013 1150   CHOLHDL 2.2 10/28/2013 1150   VLDL 14 10/28/2013 1150   LDLCALC 62 10/28/2013 1150   LDLDIRECT 52 07/05/2009 1820    Physical Exam:   VS:  There were no vitals taken for this visit.   Wt Readings from Last 3 Encounters:  04/26/20 133 lb 9.6 oz (60.6 kg)  02/09/20 127 lb 8 oz (57.8 kg)  02/04/20 118 lb (53.5 kg)    General: Well nourished, well developed, in no acute distress Head: Atraumatic, normal size  Eyes: PEERLA, EOMI  Neck: Supple, no JVD Endocrine: No thryomegaly Cardiac: Normal S1, S2; RRR; no murmurs, rubs, or gallops Lungs: Clear to auscultation bilaterally, no wheezing, rhonchi or rales  Abd: Soft, nontender, no hepatomegaly  Ext: No edema, pulses 2+ Musculoskeletal: No deformities, BUE and BLE strength normal and equal Skin: Warm and dry, no rashes   Neuro: Alert and oriented to person, place, time, and situation, CNII-XII grossly intact, no focal deficits  Psych: Normal mood and affect   ASSESSMENT:   Christine [name] is a 50 y.o. female who presents for the following: No diagnosis found.  PLAN:   There are no diagnoses linked to this encounter.  Disposition: No follow-ups on file.  Medication Adjustments/Labs and Tests Ordered: Current medicines are reviewed at length with the patient today.  Concerns regarding medicines are outlined above.  No orders of the defined types were placed in this encounter.  No orders of the defined types were placed in this encounter.   There are no Patient Instructions on file for this visit.   Time Spent with Patient: I  have spent a total of *** minutes with patient reviewing hospital notes, telemetry, EKGs, labs and examining the patient as well as establishing an assessment and plan that was discussed with the patient.  > 50% of time was spent in direct patient care.  Signed, Addison Naegeli. Audie Box, [place]  [address], [place], [place] [address] [phone]  04/30/2020 3:40 PM

## 2020-05-01 ENCOUNTER — Ambulatory Visit: Payer: 59 | Admitting: Cardiovascular Disease

## 2020-05-01 DIAGNOSIS — R0602 Shortness of breath: Secondary | ICD-10-CM

## 2020-05-01 DIAGNOSIS — E782 Mixed hyperlipidemia: Secondary | ICD-10-CM

## 2020-05-01 DIAGNOSIS — Z720 Tobacco use: Secondary | ICD-10-CM

## 2020-05-01 DIAGNOSIS — I739 Peripheral vascular disease, unspecified: Secondary | ICD-10-CM

## 2020-05-01 DIAGNOSIS — I2510 Atherosclerotic heart disease of native coronary artery without angina pectoris: Secondary | ICD-10-CM

## 2020-05-09 ENCOUNTER — Other Ambulatory Visit: Payer: Self-pay | Admitting: Family Medicine

## 2020-05-11 NOTE — Progress Notes (Signed)
No show

## 2020-05-12 ENCOUNTER — Telehealth: Payer: 59 | Admitting: Adult Health

## 2020-05-12 ENCOUNTER — Telehealth (INDEPENDENT_AMBULATORY_CARE_PROVIDER_SITE_OTHER): Payer: Self-pay | Admitting: General Practice

## 2020-05-12 DIAGNOSIS — I739 Peripheral vascular disease, unspecified: Secondary | ICD-10-CM

## 2020-06-01 ENCOUNTER — Other Ambulatory Visit: Payer: Self-pay | Admitting: Vascular Surgery

## 2020-06-15 ENCOUNTER — Other Ambulatory Visit: Payer: Self-pay | Admitting: Vascular Surgery

## 2020-06-20 ENCOUNTER — Other Ambulatory Visit: Payer: Self-pay | Admitting: Family Medicine

## 2020-07-03 ENCOUNTER — Encounter (HOSPITAL_COMMUNITY): Payer: Medicaid Other

## 2020-07-03 ENCOUNTER — Ambulatory Visit: Payer: Medicaid Other

## 2020-07-03 ENCOUNTER — Ambulatory Visit (HOSPITAL_COMMUNITY): Payer: Self-pay | Attending: Vascular Surgery

## 2020-07-03 ENCOUNTER — Ambulatory Visit (HOSPITAL_COMMUNITY): Payer: Self-pay

## 2020-07-11 ENCOUNTER — Encounter: Payer: Self-pay | Admitting: General Practice

## 2020-07-14 ENCOUNTER — Other Ambulatory Visit: Payer: Self-pay | Admitting: Family Medicine

## 2020-08-03 ENCOUNTER — Other Ambulatory Visit: Payer: Self-pay

## 2020-08-03 ENCOUNTER — Ambulatory Visit (HOSPITAL_COMMUNITY)
Admission: RE | Admit: 2020-08-03 | Discharge: 2020-08-03 | Disposition: A | Discharge status: Home / Self Care | Payer: 59 | Source: Ambulatory Visit | Attending: Vascular Surgery | Admitting: Vascular Surgery

## 2020-08-03 ENCOUNTER — Ambulatory Visit (INDEPENDENT_AMBULATORY_CARE_PROVIDER_SITE_OTHER)
Admission: RE | Admit: 2020-08-03 | Discharge: 2020-08-03 | Disposition: A | Discharge status: Home / Self Care | Payer: 59 | Source: Ambulatory Visit | Attending: Vascular Surgery | Admitting: Vascular Surgery

## 2020-08-03 ENCOUNTER — Ambulatory Visit (INDEPENDENT_AMBULATORY_CARE_PROVIDER_SITE_OTHER): Payer: 59 | Admitting: Physician Assistant

## 2020-08-03 VITALS — BP 153/88 | HR 84 | Temp 98.1°F | Resp 20 | Ht 69.0 in | Wt 135.2 lb

## 2020-08-03 DIAGNOSIS — I739 Peripheral vascular disease, unspecified: Secondary | ICD-10-CM

## 2020-08-03 NOTE — Progress Notes (Signed)
History of Present Illness:  Patient is a 50 y.o. year old female who presents for evaluation of claudication.  She has a  history of diabetes, hypertension and everyday smoking. She was last seen by Dr. Donzetta Matters on 7/16/2.  Stated that when she walks up steps she has to stop due to leg pain particularly in the calves.  She can otherwise walk at least 5 minutes without stopping.  She denies any tissue loss or ulceration.  She has never had any vascular invention.  Her ABIs to be 0.77 right and 0.73 left with toe pressure 103 on the right and 95 on the left.  Monophasic waveforms bilaterally.    Past Medical History:  Diagnosis Date  . Alcohol abuse   . Cellulitis 11/2016   RIGHT ARM  . Chronic pancreatitis ([place])   . Diabetes mellitus   . Esophageal candidiasis ([place]) 03/12/2019  . Hypertension   . Substance abuse [facility])     Past Surgical History:  Procedure Laterality Date  . BREAST REDUCTION SURGERY      ROS:   General:  No weight loss, Fever, chills  HEENT: No recent headaches, no nasal bleeding, no visual changes, no sore throat  Neurologic: No dizziness, blackouts, seizures. No recent symptoms of stroke or mini- stroke. No recent episodes of slurred speech, or temporary blindness.  Cardiac: No recent episodes of chest pain/pressure, no shortness of breath at rest.  No shortness of breath with exertion.  Denies history of atrial fibrillation or irregular heartbeat  Vascular: No history of rest pain in feet.  No history of claudication.  No history of non-healing ulcer, No history of DVT   Pulmonary: No home oxygen, no productive cough, no hemoptysis,  No asthma or wheezing  Musculoskeletal:  [ ]  Arthritis, [ ]  Low back pain,  [ ]  Joint pain  Hematologic:No history of hypercoagulable state.  No history of easy bleeding.  No history of anemia  Gastrointestinal: No hematochezia or melena,  No gastroesophageal reflux, no trouble swallowing  Urinary: [ ]  chronic Kidney  disease, [ ]  on HD - [ ]  MWF or [ ]  TTHS, [ ]  Burning with urination, [ ]  Frequent urination, [ ]  Difficulty urinating;   Skin: No rashes  Psychological: No history of anxiety,  No history of depression  Social History Social History   Tobacco Use  . Smoking status: Current Every Day Smoker    Packs/day: 1.00    Years: 22.00    Pack years: 22.00    Types: Cigarettes  . Smokeless tobacco: Never Used  . Tobacco comment: working on quitting  Vaping Use  . Vaping Use: Never used  Substance Use Topics  . Alcohol use: Yes    Alcohol/week: 0.0 standard drinks    Comment: 11/2016 " i DRINK LESS THAN i USED TO,BUT i DRINK BEER EVERYDAY  . Drug use: Not Currently    Comment: last use two weeks ago    Family History No family history on file.  Allergies  No Known Allergies   Current Outpatient Medications  Medication Sig Dispense Refill  . Accu-Chek Softclix Lancets lancets Use as instructed 100 each 12  . aspirin EC 81 MG tablet Take 1 tablet (81 mg total) by mouth daily. Swallow whole. 30 tablet 11  . atorvastatin (LIPITOR) 20 MG tablet Take 1 tablet (20 mg total) by mouth daily. 90 tablet 3  . CHANTIX 1 MG tablet TAKE 1 TABLET BY MOUTH TWICE A DAY 60 tablet 1  .  CREON 36000-114000 units CPEP capsule TAKE 1 CAPSULE BY MOUTH THREE TIMES A DAY BEFORE MEALS 180 capsule 0  . FLUoxetine (PROZAC) 20 MG capsule TAKE 1 CAPSULE BY MOUTH EVERY DAY 90 capsule 1  . gabapentin (NEURONTIN) 300 MG capsule TAKE 1 CAPSULE BY MOUTH THREE TIMES A DAY 270 capsule 1  . losartan-hydrochlorothiazide (HYZAAR) 50-12.5 MG tablet TAKE 1 TABLET BY MOUTH EVERY DAY 90 tablet 1  . metFORMIN (GLUCOPHAGE) 1000 MG tablet TAKE 1 TABLET BY MOUTH EVERY DAY WITH BREAKFAST 30 tablet 5  . nicotine (NICODERM CQ - DOSED IN MG/24 HOURS) 14 mg/24hr patch APPLY 1 PATCH EVERY MORNING 14 patch 3  . nicotine polacrilex (NICORETTE) 2 MG gum USE AS NEEDED FOR CRAVINGS 110 each 3  . predniSONE (DELTASONE) 5 MG tablet PLEASE TAKE  2 TABLETS EVERY OTHER DAY, ALTERNATING WITH 2 AND A HALF EVERY OTHER DAY FOR 1 MONTH. 45 tablet 0  . rosuvastatin (CRESTOR) 10 MG tablet TAKE 1 TABLET BY MOUTH EVERY DAY 90 tablet 3   No current facility-administered medications for this visit.    Physical Examination  Vitals:   08/03/20 1410  BP: (!) 153/88  Pulse: 84  Resp: 20  Temp: 98.1 F (36.7 C)  TempSrc: Temporal  SpO2: 100%  Weight: 135 lb 3.2 oz (61.3 kg)  Height: 5\' 9"  (1.753 m)    Body mass index is 19.97 kg/m.  General:  Alert and oriented, no acute distress HEENT: Normal Neck: No bruit or JVD Pulmonary: Clear to auscultation bilaterally Cardiac: Regular Rate and Rhythm without murmur Abdomen: Soft, non-tender, non-distended, no mass, no scars Skin: No rash, darkened right second toe tip with callus.   Extremity Pulses:  2+ radial, brachial, femoral, dorsalis pedis, posterior tibial pulses bilaterally Musculoskeletal: No deformity or edema  Neurologic: Upper and lower extremity motor 5/5 and symmetric  DATA:    ABI Findings:  +---------+------------------+-----+---------+--------+  Right  Rt Pressure (mmHg)IndexWaveform Comment   +---------+------------------+-----+---------+--------+  Brachial 160                      +---------+------------------+-----+---------+--------+  ATA   140        0.88            +---------+------------------+-----+---------+--------+  PTA   129        0.81 biphasic       +---------+------------------+-----+---------+--------+  DP                triphasic      +---------+------------------+-----+---------+--------+  Great Toe106        0.66            +---------+------------------+-----+---------+--------+   +---------+------------------+-----+---------+-------+  Left   Lt Pressure (mmHg)IndexWaveform Comment   +---------+------------------+-----+---------+-------+  Brachial 158                      +---------+------------------+-----+---------+-------+  ATA   153        0.96            +---------+------------------+-----+---------+-------+  PTA   118        0.74 triphasic      +---------+------------------+-----+---------+-------+  DP                biphasic       +---------+------------------+-----+---------+-------+  Great Toe110        0.69            +---------+------------------+-----+---------+-------+   +-------+-----------+-----------+------------+------------+  ABI/TBIToday's ABIToday's TBIPrevious ABIPrevious TBI  +-------+-----------+-----------+------------+------------+  Right 0.88  0.66    0.77    0.73      +-------+-----------+-----------+------------+------------+  Left  0.96    0.69    0.73    0.67      +-------+-----------+-----------+------------+------------+    Bilateral ABIs appear increased compared to prior study on 11/12/2019.    Summary:  Right: Resting right ankle-brachial index indicates mild right lower  extremity arterial disease. The right toe-brachial index is abnormal. RT  great toe pressure = 106 mmHg.   Left: Resting left ankle-brachial index is within normal range. No  evidence of significant left lower extremity arterial disease. The left  toe-brachial index is abnormal. LT Great toe pressure = 110 mmHg.  Although ankle brachial indices are within normal limits (0.95-1.29),  arterial Doppler waveforms at the ankle suggest some component of arterial  occlusive disease.       +----------+--------+-----+--------+--------+--------------------+  RIGHT   PSV cm/sRatioStenosisWaveformComments        +----------+--------+-----+--------+--------+--------------------+  CFA Mid   77          biphasic            +----------+--------+-----+--------+--------+--------------------+  CFA Distal86          biphasic            +----------+--------+-----+--------+--------+--------------------+  DFA    98          biphasic            +----------+--------+-----+--------+--------+--------------------+  SFA Prox 46                          +----------+--------+-----+--------+--------+--------------------+  SFA Mid  0                           +----------+--------+-----+--------+--------+--------------------+  SFA Distal40          biphasicFills via collateral  +----------+--------+-----+--------+--------+--------------------+  POP Prox 37          biphasic            +----------+--------+-----+--------+--------+--------------------+  POP Distal41          biphasic            +----------+--------+-----+--------+--------+--------------------+  ATA Distal28          biphasic            +----------+--------+-----+--------+--------+--------------------+  PTA Distal17          biphasic            +----------+--------+-----+--------+--------+--------------------+   A focal velocity elevation of 190 cm/s was obtained at Proximal SFA with a  VR of 4.1. Findings are characteristic of 50-74% stenosis. A 2nd focal  velocity elevation was visualized, measuring at Mid SFA. Findings are  characteristic of occluded.      +----------+--------+-----+--------+----------+---------------------+  LEFT   PSV cm/sRatioStenosisWaveform Comments         +----------+--------+-----+--------+----------+---------------------+  CFA Distal90          biphasic               +----------+--------+-----+--------+----------+---------------------+  DFA    88          biphasic              +----------+--------+-----+--------+----------+---------------------+  SFA Prox 0       occluded                  +----------+--------+-----+--------+----------+---------------------+  SFA Mid  0       occluded                  +----------+--------+-----+--------+----------+---------------------+  SFA Distal53          biphasic fills via collaterals  +----------+--------+-----+--------+----------+---------------------+  POP Prox 105          biphasic              +----------+--------+-----+--------+----------+---------------------+  POP Distal32          biphasic              +----------+--------+-----+--------+----------+---------------------+  ATA Distal22          biphasic              +----------+--------+-----+--------+----------+---------------------+  PTA Distal18          monophasic             +----------+--------+-----+--------+----------+---------------------+   A focal velocity elevation of 0 cm/s was obtained at SFA. Findings are  characteristic of total occlusion.    Summary:  Right: Total occlusion noted in the superficial femoral artery.   Left: Total occlusion noted in the superficial femoral artery.   ASSESSMENT:  PAD with occluded B SFA She has improved biphasic flow with collateral to the tibials She does not have open non healing wounds and has no reportable symptoms of claudication. Her ABI's are stable    PLAN: She will cont. To work on smoking cessation, walk for exercise and check her feet daily for wounds.  If she develops symptoms of ischemia she will call, other wise she will f/u in 6 months for repeat ABI.    Roxy Horseman PA-C [facility] Office: [phone]  MD in clinic [place]

## 2020-08-04 ENCOUNTER — Other Ambulatory Visit: Payer: Self-pay

## 2020-08-04 DIAGNOSIS — I739 Peripheral vascular disease, unspecified: Secondary | ICD-10-CM

## 2020-08-16 ENCOUNTER — Other Ambulatory Visit: Payer: Self-pay | Admitting: *Deleted

## 2020-08-17 MED ORDER — METFORMIN HCL 1000 MG PO TABS: ORAL_TABLET | ORAL | 0 refills | Status: DC

## 2020-08-31 ENCOUNTER — Other Ambulatory Visit: Payer: Self-pay | Admitting: Family Medicine

## 2020-09-15 ENCOUNTER — Other Ambulatory Visit: Payer: Self-pay | Admitting: Family Medicine

## 2020-09-27 ENCOUNTER — Other Ambulatory Visit: Payer: Self-pay | Admitting: Family Medicine

## 2020-10-10 ENCOUNTER — Other Ambulatory Visit: Payer: Self-pay | Admitting: Family Medicine

## 2020-10-14 ENCOUNTER — Other Ambulatory Visit: Payer: Self-pay | Admitting: Family Medicine

## 2020-10-16 ENCOUNTER — Other Ambulatory Visit: Payer: Self-pay | Admitting: Family Medicine

## 2020-10-22 ENCOUNTER — Emergency Department (HOSPITAL_COMMUNITY): Payer: 59

## 2020-10-22 ENCOUNTER — Encounter (HOSPITAL_COMMUNITY): Payer: Self-pay

## 2020-10-22 ENCOUNTER — Other Ambulatory Visit: Payer: Self-pay

## 2020-10-22 ENCOUNTER — Inpatient Hospital Stay (HOSPITAL_COMMUNITY)
Admission: EM | Admit: 2020-10-22 | Discharge: 2020-10-24 | DRG: 872 | Disposition: A | Discharge status: Home / Self Care | Payer: 59 | Attending: Family Medicine | Admitting: Family Medicine

## 2020-10-22 DIAGNOSIS — I152 Hypertension secondary to endocrine disorders: Secondary | ICD-10-CM

## 2020-10-22 DIAGNOSIS — A4151 Sepsis due to Escherichia coli [E. coli]: Secondary | ICD-10-CM | POA: Diagnosis not present

## 2020-10-22 DIAGNOSIS — Z20822 Contact with and (suspected) exposure to covid-19: Secondary | ICD-10-CM | POA: Diagnosis present

## 2020-10-22 DIAGNOSIS — D72829 Elevated white blood cell count, unspecified: Secondary | ICD-10-CM

## 2020-10-22 DIAGNOSIS — E119 Type 2 diabetes mellitus without complications: Secondary | ICD-10-CM

## 2020-10-22 DIAGNOSIS — Z7982 Long term (current) use of aspirin: Secondary | ICD-10-CM

## 2020-10-22 DIAGNOSIS — I10 Essential (primary) hypertension: Secondary | ICD-10-CM | POA: Diagnosis present

## 2020-10-22 DIAGNOSIS — N12 Tubulo-interstitial nephritis, not specified as acute or chronic: Secondary | ICD-10-CM | POA: Diagnosis present

## 2020-10-22 DIAGNOSIS — F32A Depression, unspecified: Secondary | ICD-10-CM | POA: Diagnosis present

## 2020-10-22 DIAGNOSIS — Z79899 Other long term (current) drug therapy: Secondary | ICD-10-CM

## 2020-10-22 DIAGNOSIS — R7881 Bacteremia: Secondary | ICD-10-CM

## 2020-10-22 DIAGNOSIS — Z7984 Long term (current) use of oral hypoglycemic drugs: Secondary | ICD-10-CM

## 2020-10-22 DIAGNOSIS — E1140 Type 2 diabetes mellitus with diabetic neuropathy, unspecified: Secondary | ICD-10-CM | POA: Diagnosis present

## 2020-10-22 DIAGNOSIS — L309 Dermatitis, unspecified: Secondary | ICD-10-CM | POA: Diagnosis present

## 2020-10-22 DIAGNOSIS — K860 Alcohol-induced chronic pancreatitis: Secondary | ICD-10-CM | POA: Diagnosis present

## 2020-10-22 DIAGNOSIS — R6520 Severe sepsis without septic shock: Secondary | ICD-10-CM | POA: Diagnosis present

## 2020-10-22 DIAGNOSIS — B9620 Unspecified Escherichia coli [E. coli] as the cause of diseases classified elsewhere: Secondary | ICD-10-CM

## 2020-10-22 DIAGNOSIS — E1159 Type 2 diabetes mellitus with other circulatory complications: Secondary | ICD-10-CM

## 2020-10-22 DIAGNOSIS — E785 Hyperlipidemia, unspecified: Secondary | ICD-10-CM | POA: Diagnosis present

## 2020-10-22 DIAGNOSIS — D649 Anemia, unspecified: Secondary | ICD-10-CM | POA: Diagnosis present

## 2020-10-22 DIAGNOSIS — F17210 Nicotine dependence, cigarettes, uncomplicated: Secondary | ICD-10-CM | POA: Diagnosis present

## 2020-10-22 DIAGNOSIS — N179 Acute kidney failure, unspecified: Secondary | ICD-10-CM | POA: Diagnosis present

## 2020-10-22 LAB — URINALYSIS, ROUTINE W REFLEX MICROSCOPIC
Bilirubin Urine: NEGATIVE
Glucose, UA: >=500 mg/dL — AB
Ketones, ur: NEGATIVE mg/dL
Nitrite: POSITIVE — AB
Protein, ur: 100 mg/dL — AB
Specific Gravity, Urine: 1.007 (ref 1.005–1.030)
pH: 5 (ref 5.0–8.0)

## 2020-10-22 LAB — COMPREHENSIVE METABOLIC PANEL
ALT: 15 U/L (ref 0–44)
AST: 15 U/L (ref 15–41)
Albumin: 3.1 g/dL — ABNORMAL LOW (ref 3.5–5.0)
Alkaline Phosphatase: 133 U/L — ABNORMAL HIGH (ref 38–126)
Anion gap: 14 (ref 5–15)
BUN: 21 mg/dL — ABNORMAL HIGH (ref 6–20)
CO2: 22 mmol/L (ref 22–32)
Calcium: 9 mg/dL (ref 8.9–10.3)
Chloride: 100 mmol/L (ref 98–111)
Creatinine, Ser: 1.77 mg/dL — ABNORMAL HIGH (ref 0.44–1.00)
GFR, Estimated: 35 mL/min — ABNORMAL LOW (ref >60)
Glucose, Bld: 376 mg/dL — ABNORMAL HIGH (ref 70–99)
Potassium: 4.3 mmol/L (ref 3.5–5.1)
Sodium: 136 mmol/L (ref 135–145)
Total Bilirubin: 0.5 mg/dL (ref 0.3–1.2)
Total Protein: 6.5 g/dL (ref 6.5–8.1)

## 2020-10-22 LAB — CBC WITH DIFFERENTIAL/PLATELET
Abs Immature Granulocytes: 0 10*3/uL (ref 0.00–0.07)
Basophils Absolute: 0 10*3/uL (ref 0.0–0.1)
Basophils Relative: 0 %
Eosinophils Absolute: 0 10*3/uL (ref 0.0–0.5)
Eosinophils Relative: 0 %
HCT: 35.5 % — ABNORMAL LOW (ref 36.0–46.0)
Hemoglobin: 11.6 g/dL — ABNORMAL LOW (ref 12.0–15.0)
Lymphocytes Relative: 5 %
Lymphs Abs: 0.3 10*3/uL — ABNORMAL LOW (ref 0.7–4.0)
MCH: 30.4 pg (ref 26.0–34.0)
MCHC: 32.7 g/dL (ref 30.0–36.0)
MCV: 92.9 fL (ref 80.0–100.0)
Monocytes Absolute: 0 10*3/uL — ABNORMAL LOW (ref 0.1–1.0)
Monocytes Relative: 0 %
Neutro Abs: 5.5 10*3/uL (ref 1.7–7.7)
Neutrophils Relative %: 95 %
Platelets: 144 10*3/uL — ABNORMAL LOW (ref 150–400)
RBC: 3.82 MIL/uL — ABNORMAL LOW (ref 3.87–5.11)
RDW: 14.4 % (ref 11.5–15.5)
WBC: 5.8 10*3/uL (ref 4.0–10.5)
nRBC: 0 % (ref 0.0–0.2)
nRBC: 0 /100 WBC

## 2020-10-22 LAB — GLUCOSE, CAPILLARY
Glucose-Capillary: 382 mg/dL — ABNORMAL HIGH (ref 70–99)
Glucose-Capillary: 246 mg/dL — ABNORMAL HIGH (ref 70–99)

## 2020-10-22 LAB — LACTIC ACID, PLASMA
Lactic Acid, Venous: 3.2 mmol/L (ref 0.5–1.9)
Lactic Acid, Venous: 1.6 mmol/L (ref 0.5–1.9)

## 2020-10-22 LAB — PREGNANCY, URINE: Preg Test, Ur: NEGATIVE

## 2020-10-22 LAB — I-STAT BETA HCG BLOOD, ED (MC, WL, AP ONLY): I-stat hCG, quantitative: 15 m[IU]/mL — ABNORMAL HIGH (ref <5)

## 2020-10-22 LAB — RESP PANEL BY RT-PCR (FLU A&B, COVID) ARPGX2
Influenza A by PCR: NEGATIVE
Influenza B by PCR: NEGATIVE
SARS Coronavirus 2 by RT PCR: NEGATIVE

## 2020-10-22 LAB — CBG MONITORING, ED
Glucose-Capillary: 373 mg/dL — ABNORMAL HIGH (ref 70–99)
Glucose-Capillary: 419 mg/dL — ABNORMAL HIGH (ref 70–99)

## 2020-10-22 MED ORDER — NICOTINE 14 MG/24HR TD PT24
14.0000 mg | MEDICATED_PATCH | Freq: Every day | TRANSDERMAL | Status: DC
  Administered 2020-10-22 – 2020-10-24 (×3): 14 mg via TRANSDERMAL
  Filled 2020-10-22 (×3): qty 1

## 2020-10-22 MED ORDER — INSULIN ASPART 100 UNIT/ML IJ SOLN
0.0000 [IU] | Freq: Three times a day (TID) | INTRAMUSCULAR | Status: DC
  Administered 2020-10-22: 9 [IU] via SUBCUTANEOUS
  Administered 2020-10-23: 5 [IU] via SUBCUTANEOUS
  Administered 2020-10-23: 3 [IU] via SUBCUTANEOUS
  Administered 2020-10-23: 9 [IU] via SUBCUTANEOUS
  Administered 2020-10-23: 7 [IU] via SUBCUTANEOUS
  Administered 2020-10-24: 2 [IU] via SUBCUTANEOUS
  Administered 2020-10-24: 5 [IU] via SUBCUTANEOUS

## 2020-10-22 MED ORDER — ACETAMINOPHEN 325 MG PO TABS
650.0000 mg | ORAL_TABLET | Freq: Once | ORAL | Status: AC | PRN
  Administered 2020-10-22: 650 mg via ORAL
  Filled 2020-10-22: qty 2

## 2020-10-22 MED ORDER — FENTANYL CITRATE (PF) 100 MCG/2ML IJ SOLN
50.0000 ug | Freq: Once | INTRAMUSCULAR | Status: AC
  Administered 2020-10-22: 50 ug via INTRAVENOUS
  Filled 2020-10-22: qty 2

## 2020-10-22 MED ORDER — ENOXAPARIN SODIUM 40 MG/0.4ML IJ SOSY
40.0000 mg | PREFILLED_SYRINGE | INTRAMUSCULAR | Status: DC
  Administered 2020-10-23 – 2020-10-24 (×2): 40 mg via SUBCUTANEOUS
  Filled 2020-10-22 (×2): qty 0.4

## 2020-10-22 MED ORDER — SODIUM CHLORIDE 0.9 % IV BOLUS
1000.0000 mL | Freq: Once | INTRAVENOUS | Status: AC
  Administered 2020-10-22: 1000 mL via INTRAVENOUS

## 2020-10-22 MED ORDER — GABAPENTIN 300 MG PO CAPS
300.0000 mg | ORAL_CAPSULE | Freq: Three times a day (TID) | ORAL | Status: DC
  Administered 2020-10-22 – 2020-10-23 (×3): 300 mg via ORAL
  Filled 2020-10-22 (×3): qty 1

## 2020-10-22 MED ORDER — PANCRELIPASE (LIP-PROT-AMYL) 36000-114000 UNITS PO CPEP
36000.0000 [IU] | ORAL_CAPSULE | Freq: Three times a day (TID) | ORAL | Status: DC
  Administered 2020-10-22 – 2020-10-24 (×6): 36000 [IU] via ORAL
  Filled 2020-10-22 (×8): qty 1

## 2020-10-22 MED ORDER — PREDNISONE 10 MG PO TABS
10.0000 mg | ORAL_TABLET | Freq: Every day | ORAL | Status: DC
  Administered 2020-10-22 – 2020-10-24 (×3): 10 mg via ORAL
  Filled 2020-10-22 (×3): qty 1

## 2020-10-22 MED ORDER — SODIUM CHLORIDE 0.9 % IV SOLN
1.0000 g | INTRAVENOUS | Status: DC
  Filled 2020-10-22: qty 10

## 2020-10-22 MED ORDER — ACETAMINOPHEN 650 MG RE SUPP: 650.0000 mg | Freq: Four times a day (QID) | RECTAL | Status: DC | PRN

## 2020-10-22 MED ORDER — ACETAMINOPHEN 325 MG PO TABS
650.0000 mg | ORAL_TABLET | Freq: Four times a day (QID) | ORAL | Status: DC | PRN
  Administered 2020-10-22 – 2020-10-23 (×3): 650 mg via ORAL
  Filled 2020-10-22 (×3): qty 2

## 2020-10-22 MED ORDER — SODIUM CHLORIDE 0.9 % IV SOLN: INTRAVENOUS | Status: DC

## 2020-10-22 MED ORDER — ROSUVASTATIN CALCIUM 5 MG PO TABS
10.0000 mg | ORAL_TABLET | Freq: Every day | ORAL | Status: DC
  Administered 2020-10-22 – 2020-10-24 (×3): 10 mg via ORAL
  Filled 2020-10-22 (×3): qty 2

## 2020-10-22 MED ORDER — SODIUM CHLORIDE 0.9 % IV SOLN
1.0000 g | Freq: Once | INTRAVENOUS | Status: AC
  Administered 2020-10-22: 1 g via INTRAVENOUS
  Filled 2020-10-22: qty 10

## 2020-10-22 MED ORDER — ONDANSETRON HCL 4 MG/2ML IJ SOLN
4.0000 mg | Freq: Once | INTRAMUSCULAR | Status: AC
  Administered 2020-10-22: 4 mg via INTRAVENOUS
  Filled 2020-10-22: qty 2

## 2020-10-22 NOTE — ED Notes (Signed)
Patient transported to CT 

## 2020-10-22 NOTE — ED Notes (Addendum)
Lactic 3.2 reported to [place] MD

## 2020-10-22 NOTE — H&P (Addendum)
[facility] Admission History and Physical Service Pager: [phone]  Patient name: Christine [name] Medical record number: [id number] Date of birth: [birth year] Age: 50 y.o. Gender: female  Primary Care Provider: Gifford Shave, MD Consultants: N/a Code Status: FULL Preferred Emergency Contact: Micheal Primes [phone]  Chief Complaint: Back pain  Assessment and Plan: NYIMA VANACKER is a 50 y.o. female presenting with back pain and fever . PMH is significant for T2DM, HTN, tobacco use, HLD, chronic pancreatitis, chronic dermatitis  Pyelonephritis, meeting sepsis criteria Back pain since Saturday with high fevers and decreased oral intake. In the ED, patient was initially febrile, tachycardic with elevated lactic acid of 3.2>1.6. WBC wnl at 5.8. UA nitrite positive with leukocytes and bacteria, glucosuria, proteinuria.  Renal stone study showed new bilateral perinephric stranding, no urinary calculi or obstruction.  Blood and urine cultures were collected and patient was started on ceftriaxone.  Patient received total of 2 L of IVF.  Physical exam significant for CVA tenderness bilaterally; patient denies ever having urinary symptoms.  Physical exam, imaging, labs appear consistent with a pyelonephritis. - Admit to [facility], attending Dr. Erin Hearing - Vitals per routine - Out of bed as tolerated - CTX 1g q24h - Tylenol 650mg  q6h PRN - Follow-up a.m. CBC - Follow-up urine and blood cultures  AKI  Creatinine 1.77, baseline appears to be around 0.8-1.0.  Patient received 2 L of total IVF in the ED.  Likely elevated in the setting of infection with possible decreased oral intake and taking NSAIDs/BC powder. - IVF with NS at 75 mL/h - Continue to monitor with BMP  Hair loss Patient reports that she has noted a significant amount of hair loss with her chronic skin changes on her legs.  Last TSH 0.963 in 01/2020.  Patient has had multiple TSH checks do not visit will  necessarily be elevated time, but cannot officially ruled out so we will get a TSH. - Follow-up AM TSH  Type 2 DM CBG on admission 373.  Last A1c 6.5 in 03/2020.  Patient appears to be chronically on steroids, which is likely affecting her diabetes.  Home medications include metformin 1000 mg daily, gabapentin 300 mg 3 times daily. - Sensitive SSI - CBGs before every meal and nightly - Continue home gabapentin - Follow-up A1c  HTN BP on admission fluctuating between normotensive and softer blood pressures within latest blood pressure 97/57.  Medications include losartan-HCTZ 50-12.5 mg daily - Continue to monitor BP - Hold home losartan HCTZ  Chronic alcoholic pancreatitis Patient reports last alcoholic drink was 4/12 and she has been drinking 2-3 beers per day prior to that. Home medications include Creon 3 times daily with meals - Continue home creon  Nicotine dependence Patient smokes about 1/2 PPD and would like nicotine patch while hospitalized. - NicoDerm 14 mg every 24 hours  HLD Home medications include rosuvastatin 10 mg daily - Continue home rosuvastatin 10 mg daily  Dermatitis Patient with an unknown etiology of rash with hyperpigmentation that is chronic with pruritus on bilateral lower extremities.  Patient has been instructed to follow-up with dermatology but has not yet been able to.  Is taking prednisone 10 mg - Holding home prednisone  FEN/GI: Carb modified diet Prophylaxis: Lovenox  Disposition: Med-surg  History of Present Illness:  Christine [name] is a 50 y.o. female presenting with fever and back pain.   Back pain starting Thursday and took a BC powder and helped. Later that night the pain worsened and  started shaking. Has right leg pain that radiates into her thigh. Pain scale of 2/10 currently described as an ache that is constant. Nothing makes it worse or better. No issue with urinating. No dysuria. Dark urine, non bloody. Normal appetite.   Took home  medication yesterday. Drinks 2-3 cans of beer daily when she gets off of work. Last reported drink 10/20/2020. Hx of marijuana use. Current smoker 1/2 pk/day. Desires nicotine patch.   Review Of Systems: Per HPI with the following additions:   Review of Systems  Constitutional:  Positive for chills and fever. Negative for appetite change.  HENT:  Negative for congestion and sore throat.   Eyes:  Negative for visual disturbance.  Respiratory:  Negative for cough and shortness of breath.   Cardiovascular:  Negative for chest pain.  Gastrointestinal:  Positive for abdominal pain. Negative for nausea and vomiting.  Endocrine: Negative for polyuria.  Genitourinary:  Negative for difficulty urinating, dysuria, frequency and urgency.  Neurological:  Positive for dizziness and weakness. Negative for light-headedness and headaches.    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) 11/05/2013   Vitamin D deficiency 11/01/2013   Essential hypertension, benign 12/25/2012   Chronic alcoholic  pancreatitis 30/16/0109   Protein-calorie malnutrition, severe ([place]) 11/18/2012   Depression 09/29/2012   Neuropathic pain 01/29/2011   DRUG ABUSE, HX OF 07/05/2009   ANEMIA 09/28/2008   HYPERLIPIDEMIA 09/12/2008   Tobacco use disorder 09/12/2008   Diabetes mellitus with neuropathy ([place]) 06/26/2006    Past Medical History: Past Medical History:  Diagnosis Date   Alcohol abuse    Cellulitis 11/2016   RIGHT ARM    Chronic pancreatitis ([place])    Diabetes mellitus    Esophageal candidiasis ([place]) 03/12/2019   Hypertension    Substance abuse ([place])     Past Surgical History: Past Surgical History:  Procedure Laterality Date   BREAST REDUCTION SURGERY      Social History: Social History   Tobacco Use   Smoking status: Every Day    Packs/day: 1.00    Years: 22.00    Pack years: 22.00    Types: Cigarettes   Smokeless tobacco: Never   Tobacco comments:    working on quitting  Vaping Use   Vaping Use: Never used  Substance Use Topics   Alcohol use: Yes    Alcohol/week: 0.0 standard drinks    Comment: 11/2016 " i DRINK LESS THAN i USED TO,BUT i DRINK BEER EVERYDAY   Drug use: Not Currently    Comment: last use two weeks ago   Additional social history: Lives at home with significant other and daughter Please also refer to relevant sections of EMR.  Family History: No family history on file.  Allergies and Medications: No Known Allergies No current facility-administered medications on file prior to encounter.   Current Outpatient Medications on File Prior to Encounter  Medication Sig Dispense Refill   acetaminophen (TYLENOL) 500 MG tablet Take 1,000 mg by mouth every 6 (six) hours as needed for mild pain.  aspirin EC 81 MG tablet Take 1 tablet (81 mg total) by mouth daily. Swallow whole. 30 tablet 11   CREON 36000-114000 units CPEP capsule TAKE 1 CAPSULE BY MOUTH THREE TIMES A DAY BEFORE MEALS (Patient taking differently: Take 36,000 Units by mouth 3 (three) times daily before meals.) 180 capsule 0   gabapentin (NEURONTIN) 300 MG capsule TAKE 1 CAPSULE BY MOUTH THREE TIMES A DAY (Patient taking differently: Take 300 mg by mouth 3 (three) times daily.) 270 capsule 1   losartan-hydrochlorothiazide (HYZAAR) 50-12.5 MG tablet TAKE 1 TABLET BY MOUTH EVERY DAY (Patient taking differently: Take 1 tablet by mouth daily.) 90 tablet 1   metFORMIN (GLUCOPHAGE) 1000 MG tablet TAKE 1 TABLET BY MOUTH  EVERY DAY WITH BREAKFAST (Patient taking differently: Take 1,000 mg by mouth daily with breakfast.) 90 tablet 1   predniSONE (DELTASONE) 5 MG tablet TAKE 2 TABLETS EVERY OTHER DAY, ALTERNATING WITH 2 AND A HALF EVERY OTHER DAY FOR 1 MONTH. (Patient taking differently: Take 10 mg by mouth daily. TAKE 2 TABLETS EVERY OTHER DAY, ALTERNATING WITH 2 AND A HALF EVERY OTHER DAY FOR 1 MONTH.) 45 tablet 0   rosuvastatin (CRESTOR) 10 MG tablet TAKE 1 TABLET BY MOUTH EVERY DAY (Patient taking differently: Take 10 mg by mouth daily.) 90 tablet 3   Accu-Chek Softclix Lancets lancets Use as instructed 100 each 12   atorvastatin (LIPITOR) 20 MG tablet Take 1 tablet (20 mg total) by mouth daily. (Patient not taking: Reported on 10/22/2020) 90 tablet 3   CHANTIX 1 MG tablet TAKE 1 TABLET BY MOUTH TWICE A DAY (Patient not taking: Reported on 10/22/2020) 60 tablet 1   FLUoxetine (PROZAC) 20 MG capsule TAKE 1 CAPSULE BY MOUTH EVERY DAY (Patient not taking: Reported on 10/22/2020) 90 capsule 1   nicotine (NICODERM CQ - DOSED IN MG/24 HOURS) 14 mg/24hr patch APPLY 1 PATCH EVERY MORNING (Patient not taking: Reported on 10/22/2020) 14 patch 3   nicotine polacrilex (NICORETTE) 2 MG gum USE AS NEEDED FOR CRAVINGS (Patient not taking: Reported on 10/22/2020) 110 each 3    Objective: BP (!) 97/57   Pulse 77   Temp 98.2 F (36.8 C) (Oral)   Resp 14   Ht 5\' 9"  (1.753 m)   Wt 64.4 kg   SpO2 99%   BMI 20.97 kg/m  Exam: General -- oriented x3, pleasant and cooperative. HEENT -- Head is normocephalic. PERRLA. EOMI. Ears, nose and throat were benign. Neck -- supple; no bruits. Integument -- intact. No erythema, or ecchymoses.  Areas of chronic appearing hyperpigmentation noted on bilateral lower extremities Chest -- good expansion. Lungs clear to auscultation. Cardiac -- RRR. No murmurs noted.  Abdomen -- soft, nontender. No masses palpable. Bowel sounds present. Back-- CVA tenderness bilaterally. CNS -- cranial nerves II  through XII grossly intact.  Extremeties - no tenderness or effusions noted. ROM good. 5/5 bilateral strength. Dorsalis pedis pulses present and symmetrical.     Labs and Imaging: CBC BMET  Recent Labs  Lab 10/22/20 0717  WBC 5.8  HGB 11.6*  HCT 35.5*  PLT 144*   Recent Labs  Lab 10/22/20 0717  Christine 136  K 4.3  CL 100  CO2 22  BUN 21*  CREATININE 1.77*  GLUCOSE 376*  CALCIUM 9.0     EKG: NSR, QTc 451, possible prior septal infarct.  Largely unchanged from last EKG.  CT Renal Stone Study  Result Date: 10/22/2020 CLINICAL DATA:  Bilateral flank pain for 4 days with fever.  EXAM: CT ABDOMEN AND PELVIS WITHOUT CONTRAST TECHNIQUE: Multidetector CT imaging of the abdomen and pelvis was performed following the standard protocol without IV contrast. COMPARISON:  MRI abdomen 02/08/2020. CT abdomen and pelvis 10/05/2019. FINDINGS: Lower chest: Mild dependent atelectasis in both lung bases. No pleural effusion. Decreased attenuation of the blood pool suggesting anemia. Hepatobiliary: No focal liver abnormality is seen. No gallstones, gallbladder wall thickening, or biliary dilatation. Pancreas: Unchanged extensive pancreatic calcification consistent with chronic pancreatitis. No peripancreatic fluid collection or definite acute pancreatic inflammation. Spleen: Unremarkable. Adrenals/Urinary Tract: Unremarkable adrenal glands. New moderate perinephric stranding bilaterally with the kidneys appearing slightly enlarged/swollen compared to the prior CT. No perinephric fluid collection, renal calculi, or hydroureteronephrosis. Unremarkable bladder. Stomach/Bowel: The stomach is unremarkable. There is a moderate amount of stool in the rectum. There is no evidence of bowel obstruction. The appendix is unremarkable. Vascular/Lymphatic: Abdominal aortic atherosclerosis without aneurysm. No enlarged lymph nodes. Reproductive: Uterus and bilateral adnexa are unremarkable. Other: No ascites. Musculoskeletal: No  acute osseous abnormality or suspicious osseous lesion. IMPRESSION: 1. New bilateral perinephric stranding, query pyelonephritis. No urinary tract calculi or obstruction. 2. Chronic pancreatitis. 3. Aortic Atherosclerosis (ICD10-I70.0). Electronically Signed   By: Logan Bores M.D.   On: 10/22/2020 12:52     Rise Patience, DO 10/22/2020, 3:20 PM PGY-1, [place] Intern pager: [phone], text pages welcome  FPTS Upper-Level Resident Addendum   I have independently interviewed and examined the patient. I have discussed the above with the original author and agree with their documentation. My edits for correction/addition/clarification are within the document. Please see also any attending notes.   Gerlene Fee, DO PGY-2, [facility] 10/22/2020 3:43 PM  FPTS Service pager: [phone] (text pages welcome through [facility])

## 2020-10-22 NOTE — ED Provider Notes (Signed)
[facility] EMERGENCY DEPARTMENT Provider Note   CSN: [id number] Arrival date & time: 10/22/20  0349     History No chief complaint on file.   Christine [name] is a 50 y.o. female with pertinent past medical history of chronic pancreatitis with alcohol abuse, diabetes, hypertension, substance abuse that presents to the emerge department today for fevers and back pain.  Patient states that this started on Saturday when she started having chills and fevers and bilateral back pain, crosses midline.  Denies any numbness or tingling in back, saddle paresthesias, IV drug use, dysuria.  Does admit to some darker urine, however does not admit to any gross hematuria.  Patient has not been taking anything for pain or fevers.  States that she is been able to walk normally.  Denies any cough or upper respiratory symptoms.  States that she has been able to eat, states that she has not been drinking as much normally due to pain and nausea.  Denies any vomiting.  Denies any diarrhea.  Denies any abdominal pain.  No other complaints at this time.  HPI     Past Medical History:  Diagnosis Date   Alcohol abuse    Cellulitis 11/2016   RIGHT ARM   Chronic pancreatitis ([place])    Diabetes mellitus    Esophageal candidiasis ([place]) 03/12/2019   Hypertension    Substance abuse ([place])     Patient Active Problem List   Diagnosis Date Noted   Hyperkalemia 02/09/2020   Underweight 12/14/2019   Abnormal ankle brachial index (ABI) 10/06/2019   Shortness of breath 07/15/2019   Tachycardia 07/15/2019   Long term (current) use of systemic steroids 05/12/2019   Esophageal candidiasis ([place]) 03/12/2019   Fatigue 12/14/2018   Chronic cough 07/28/2018   Alcohol use 03/30/2018   Abnormal weight loss 01/13/2017   Chronic contact dermatitis 11/19/2016   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     No family history on file.  Social History   Tobacco Use   Smoking status: Every Day    Packs/day: 1.00    Years: 22.00    Pack years: 22.00    Types: Cigarettes   Smokeless tobacco: Never   Tobacco comments:    working on quitting  Vaping Use   Vaping Use: Never used  Substance Use Topics   Alcohol use: Yes    Alcohol/week: 0.0 standard drinks    Comment: 11/2016 " i DRINK LESS THAN i USED TO,BUT i DRINK BEER EVERYDAY   Drug use: Not Currently    Comment: last use two weeks ago    Home Medications Prior to Admission medications   Medication Sig Start Date End Date Taking? Authorizing Provider  acetaminophen (TYLENOL) 500 MG tablet Take 1,000 mg by mouth every 6 (six) hours as needed for mild pain.   Yes [provider]  aspirin EC 81 MG tablet Take 1 tablet (81 mg total) by mouth daily. Swallow  whole. 11/12/19  Yes Waynetta Sandy, MD  CREON [phone] units CPEP capsule TAKE 1 CAPSULE BY MOUTH THREE TIMES A DAY BEFORE MEALS Patient taking differently: Take 36,000 Units by mouth 3 (three) times daily before meals. 09/28/20  Yes Gifford Shave, MD  gabapentin (NEURONTIN) 300 MG capsule TAKE 1 CAPSULE BY MOUTH THREE TIMES A DAY Patient taking differently: Take 300 mg by mouth 3 (three) times daily. 03/01/20  Yes Gifford Shave, MD  losartan-hydrochlorothiazide (HYZAAR) 50-12.5 MG tablet TAKE 1 TABLET BY MOUTH EVERY DAY Patient taking differently: Take 1 tablet by mouth daily. 10/16/20  Yes Gifford Shave, MD  metFORMIN (GLUCOPHAGE) 1000 MG tablet TAKE 1 TABLET BY MOUTH EVERY DAY WITH BREAKFAST Patient taking differently: Take 1,000 mg by mouth daily with breakfast. 08/31/20  Yes Gifford Shave, MD  predniSONE (DELTASONE) 5 MG tablet TAKE 2 TABLETS EVERY OTHER DAY, ALTERNATING WITH 2 AND A HALF EVERY OTHER DAY FOR 1 MONTH. Patient taking differently: Take 10 mg by mouth daily. TAKE 2 TABLETS EVERY OTHER DAY, ALTERNATING WITH 2 AND A HALF EVERY OTHER DAY FOR 1 MONTH. 10/17/20  Yes Gifford Shave, MD  rosuvastatin (CRESTOR) 10 MG tablet TAKE 1 TABLET BY MOUTH EVERY DAY Patient taking differently: Take 10 mg by mouth daily. 06/15/20  Yes Waynetta Sandy, MD  Accu-Chek Softclix Lancets lancets Use as instructed 07/29/19   Shirley, [place], DO  atorvastatin (LIPITOR) 20 MG tablet Take 1 tablet (20 mg total) by mouth daily. Patient not taking: Reported on 10/22/2020 10/01/19   Shirley, [place], DO  CHANTIX 1 MG tablet TAKE 1 TABLET BY MOUTH TWICE A DAY Patient not taking: Reported on 10/22/2020 12/08/19   Waynetta Sandy, MD  FLUoxetine (PROZAC) 20 MG capsule TAKE 1 CAPSULE BY MOUTH EVERY DAY Patient not taking: Reported on 10/22/2020 12/06/19   Carollee Leitz, MD  nicotine (NICODERM CQ - DOSED IN MG/24 HOURS) 14 mg/24hr patch APPLY 1 PATCH EVERY MORNING Patient not taking: Reported on 10/22/2020 02/09/20 02/08/21  McDiarmid, Blane Ohara, MD  nicotine polacrilex (NICORETTE) 2 MG gum USE AS NEEDED FOR CRAVINGS Patient not taking: Reported on 10/22/2020 02/09/20 02/08/21  McDiarmid, Blane Ohara, MD    Allergies    Patient has no known allergies.  Review of Systems   Review of Systems  Constitutional:  Positive for fever. Negative for chills, diaphoresis and fatigue.  HENT:  Negative for congestion, sore throat and trouble swallowing.   Eyes:  Negative for pain and visual disturbance.  Respiratory:  Negative for cough, shortness of breath and wheezing.   Cardiovascular:  Negative for chest pain,  palpitations and leg swelling.  Gastrointestinal:  Negative for abdominal distention, abdominal pain, diarrhea, nausea and vomiting.  Genitourinary:  Negative for difficulty urinating, dysuria, flank pain, frequency and hematuria.  Musculoskeletal:  Positive for back pain. Negative for neck pain and neck stiffness.  Skin:  Negative for pallor.  Neurological:  Negative for dizziness, speech difficulty, weakness and headaches.  Psychiatric/Behavioral:  Negative for confusion.    Physical Exam Updated Vital Signs BP 94/63   Pulse 71   Temp 98.2 F (36.8 C) (Oral)   Resp 10   Ht 5\' 9"  (1.753 m)   Wt 64.4 kg   SpO2 99%   BMI 20.97 kg/m   Physical Exam Constitutional:      General: She is not in acute distress.    Appearance: Normal appearance. She is not ill-appearing, toxic-appearing or diaphoretic.     Comments: Patient appears uncomfortable.  HENT:  Mouth/Throat:     Mouth: Mucous membranes are moist.     Pharynx: Oropharynx is clear.  Eyes:     General: No scleral icterus.    Extraocular Movements: Extraocular movements intact.     Pupils: Pupils are equal, round, and reactive to light.  Cardiovascular:     Rate and Rhythm: Normal rate and regular rhythm.     Pulses: Normal pulses.     Heart sounds: Normal heart sounds.  Pulmonary:     Effort: Pulmonary effort is normal. No respiratory distress.     Breath sounds: Normal breath sounds. No stridor. No wheezing, rhonchi or rales.  Chest:     Chest wall: No tenderness.  Abdominal:     General: Abdomen is flat. There is no distension.     Palpations: Abdomen is soft.     Tenderness: There is no abdominal tenderness. There is no guarding or rebound.  Musculoskeletal:        General: No swelling or tenderness. Normal range of motion.     Cervical back: Normal range of motion and neck supple. No rigidity.       Back:     Right lower leg: No edema.     Left lower leg: No edema.     Comments: Patient does have  tenderness in areas depicted above. No objective numbness or midline tenderness.  Positive CVA tenderness.  No erythema or warmth, no pinpoint tenderness.  Patient is able to range back.Normal leg raise.   Skin:    General: Skin is warm and dry.     Capillary Refill: Capillary refill takes less than 2 seconds.     Coloration: Skin is not pale.  Neurological:     General: No focal deficit present.     Mental Status: She is alert and oriented to person, place, and time.     Comments: Alert. Clear speech. No facial droop. CNIII-XII grossly intact. Bilateral upper and lower extremities' sensation grossly intact. 5/5 symmetric strength with grip strength and with plantar and dorsi flexion bilaterally. Patellar DTRs are 2+ and symmetric . Normal finger to nose bilaterally. Negative pronator drift.  Psychiatric:        Mood and Affect: Mood normal.        Behavior: Behavior normal.    ED Results / Procedures / Treatments   Labs (all labs ordered are listed, but only abnormal results are displayed) Labs Reviewed  LACTIC ACID, PLASMA - Abnormal; Notable for the following components:      Result Value   Lactic Acid, Venous 3.2 (*)    All other components within normal limits  COMPREHENSIVE METABOLIC PANEL - Abnormal; Notable for the following components:   Glucose, Bld 376 (*)    BUN 21 (*)    Creatinine, Ser 1.77 (*)    Albumin 3.1 (*)    Alkaline Phosphatase 133 (*)    GFR, Estimated 35 (*)    All other components within normal limits  CBC WITH DIFFERENTIAL/PLATELET - Abnormal; Notable for the following components:   RBC 3.82 (*)    Hemoglobin 11.6 (*)    HCT 35.5 (*)    Platelets 144 (*)    Lymphs Abs 0.3 (*)    Monocytes Absolute 0.0 (*)    All other components within normal limits  URINALYSIS, ROUTINE W REFLEX MICROSCOPIC - Abnormal; Notable for the following components:   APPearance CLOUDY (*)    Glucose, UA >=500 (*)    Hgb urine dipstick MODERATE (*)  Protein, ur 100 (*)     Nitrite POSITIVE (*)    Leukocytes,Ua MODERATE (*)    Bacteria, UA MANY (*)    All other components within normal limits  I-STAT BETA HCG BLOOD, ED ([facility], [facility], [facility] ONLY) - Abnormal; Notable for the following components:   I-stat hCG, quantitative 15.0 (*)    All other components within normal limits  CBG MONITORING, ED - Abnormal; Notable for the following components:   Glucose-Capillary 373 (*)    All other components within normal limits  RESP PANEL BY RT-PCR (FLU A&B, COVID) ARPGX2  CULTURE, BLOOD (ROUTINE X 2)  CULTURE, BLOOD (ROUTINE X 2)  LACTIC ACID, PLASMA  PREGNANCY, URINE    EKG None  Radiology CT Renal Stone Study  Result Date: 10/22/2020 CLINICAL DATA:  Bilateral flank pain for 4 days with fever. EXAM: CT ABDOMEN AND PELVIS WITHOUT CONTRAST TECHNIQUE: Multidetector CT imaging of the abdomen and pelvis was performed following the standard protocol without IV contrast. COMPARISON:  MRI abdomen 02/08/2020. CT abdomen and pelvis 10/05/2019. FINDINGS: Lower chest: Mild dependent atelectasis in both lung bases. No pleural effusion. Decreased attenuation of the blood pool suggesting anemia. Hepatobiliary: No focal liver abnormality is seen. No gallstones, gallbladder wall thickening, or biliary dilatation. Pancreas: Unchanged extensive pancreatic calcification consistent with chronic pancreatitis. No peripancreatic fluid collection or definite acute pancreatic inflammation. Spleen: Unremarkable. Adrenals/Urinary Tract: Unremarkable adrenal glands. New moderate perinephric stranding bilaterally with the kidneys appearing slightly enlarged/swollen compared to the prior CT. No perinephric fluid collection, renal calculi, or hydroureteronephrosis. Unremarkable bladder. Stomach/Bowel: The stomach is unremarkable. There is a moderate amount of stool in the rectum. There is no evidence of bowel obstruction. The appendix is unremarkable. Vascular/Lymphatic: Abdominal aortic atherosclerosis without  aneurysm. No enlarged lymph nodes. Reproductive: Uterus and bilateral adnexa are unremarkable. Other: No ascites. Musculoskeletal: No acute osseous abnormality or suspicious osseous lesion. IMPRESSION: 1. New bilateral perinephric stranding, query pyelonephritis. No urinary tract calculi or obstruction. 2. Chronic pancreatitis. 3. Aortic Atherosclerosis (ICD10-I70.0). Electronically Signed   By: Logan Bores M.D.   On: 10/22/2020 12:52    Procedures Procedures   Medications Ordered in ED Medications  acetaminophen (TYLENOL) tablet 650 mg (650 mg Oral Given 10/22/20 0724)  sodium chloride 0.9 % bolus 1,000 mL (0 mLs Intravenous Stopped 10/22/20 1318)  ondansetron (ZOFRAN) injection 4 mg (4 mg Intravenous Given 10/22/20 0954)  cefTRIAXone (ROCEPHIN) 1 g in sodium chloride 0.9 % 100 mL IVPB (0 g Intravenous Stopped 10/22/20 1054)  sodium chloride 0.9 % bolus 1,000 mL (0 mLs Intravenous Stopped 10/22/20 1318)  fentaNYL (SUBLIMAZE) injection 50 mcg (50 mcg Intravenous Given 10/22/20 1317)    ED Course  I have reviewed the triage vital signs and the nursing notes.  Pertinent labs & imaging results that were available during my care of the patient were reviewed by me and considered in my medical decision making (see chart for details).    MDM Rules/Calculators/A&P                         Patient presents to the emergency department today for back pain with fever.  High suspicion for pyelonephritis or infected nephrolithiasis.  Patient is nontoxic-appearing however appears uncomfortable.  While patient was in the waiting room lactic acid was obtained and was 3 and initially temperature was 102.9 with tachycardia, by the time patient had come to the room, patient's tachycardia had significantly resolved without any medication. Patient's white count normal, patient  does not appear septic, however will obtain septic order set at this time with 2 L of IV fluids ordered and IV antibiotics for urinary source.   Low concerns for epidural abscess or cord infection, no midline tenderness, patient with normal neuro exam.  Work-up today does show pyelonephritis with AKI and UTI.  Will patient admitted at this time.  Upon reevaluation patient states that she feels much better with fluids and fentanyl on board.  Has not vomited.  Patient to be admitted at this time.  Patient admitted to family medicine service.  The patient appears reasonably stabilized for admission considering the current resources, flow, and capabilities available in the ED at this time, and I doubt any other [facility] requiring further screening and/or treatment in the ED prior to admission. Final Clinical Impression(s) / ED Diagnoses Final diagnoses:  Pyelonephritis    Rx / DC Orders ED Discharge Orders     None        Alfredia Client, PA-C 10/22/20 1434    Tegeler, Gwenyth Allegra, MD 10/22/20 1544

## 2020-10-22 NOTE — ED Triage Notes (Signed)
Patient complains of lower back pain with dark urine x 3 days. Reports fever and chills with same. Alert and oriented

## 2020-10-22 NOTE — Hospital Course (Addendum)
Christine [name] is a 50 y.o. female presenting with back pain and fever . PMH is significant for T2DM, HTN, tobacco use, HLD, chronic pancreatitis, chronic dermatitis  Pyelonephritis, meeting sepsis criteria Patient admitted with back pain for several days with high fevers and decreased oral intake.  On admission, patient was initially febrile, tachycardic with elevated lactic acid and UA significant for signs of UTI.  Renal stone study showed perinephric stranding consistent with pyelonephritis.  Blood and urine cultures obtained and patient was started on ceftriaxone.  Blood cultures resulted positive for E. coli and ID was consulted.  Patient was started on Ancef and transitioned to oral Keflex, which is continued even the patient did have an elevated creatinine and lower GFR as this was related to acute kidney infection and does not appear to be a chronic condition.  Patient scheduled with close follow-up in our clinic.  AKI Patient was admitted to creatinine of 1.77, baseline appears to be around 0.8-1.0.  In the ED, patient received 2 L total IVF and was kept on fluids with significant improvement.  Creatinine upon discharge was 1.9, BUN/creatinine ratio of 14.  Unlikely related to any concern for dehydration and patient currently does have an active kidney infection.  Type II diabetes A1c on admission 7.9.  Patient was placed on sliding scale insulin with CBG monitoring.  Home gabapentin was continued.  Patient was Continued on insulin upon discharge and was continued on home medications.  HTN Held home losartan-HCTZ due to AKI during hospitalization. Patient blood pressures were normotensive overall with systolics averaging in the 130s. Was held upon discharge, to be restarted at close outpatient follow up if kidneys appropriately improved.   Other chronic conditions were stable during hospitalization.  Issues for follow-up: Repeat BMP on outpatient follow-up to monitor creatinine  improvement. Patient continued on Keflex for total of 7 antibiotic days, ensure creatinine improves or consider adjustment of medications if indicated based on labs.  Recommend patient not take any BC or Goody powders and currently to avoid NSAIDs Resume BP medication (losartan-hctz) if creatinine improved, held during admission due to AKI and held upon discharge.

## 2020-10-23 DIAGNOSIS — F17210 Nicotine dependence, cigarettes, uncomplicated: Secondary | ICD-10-CM | POA: Diagnosis present

## 2020-10-23 DIAGNOSIS — A4151 Sepsis due to Escherichia coli [E. coli]: Secondary | ICD-10-CM | POA: Diagnosis present

## 2020-10-23 DIAGNOSIS — D649 Anemia, unspecified: Secondary | ICD-10-CM | POA: Diagnosis present

## 2020-10-23 DIAGNOSIS — N12 Tubulo-interstitial nephritis, not specified as acute or chronic: Secondary | ICD-10-CM | POA: Diagnosis present

## 2020-10-23 DIAGNOSIS — B9620 Unspecified Escherichia coli [E. coli] as the cause of diseases classified elsewhere: Secondary | ICD-10-CM | POA: Diagnosis not present

## 2020-10-23 DIAGNOSIS — D72829 Elevated white blood cell count, unspecified: Secondary | ICD-10-CM | POA: Diagnosis not present

## 2020-10-23 DIAGNOSIS — K860 Alcohol-induced chronic pancreatitis: Secondary | ICD-10-CM | POA: Diagnosis present

## 2020-10-23 DIAGNOSIS — N179 Acute kidney failure, unspecified: Secondary | ICD-10-CM | POA: Diagnosis present

## 2020-10-23 DIAGNOSIS — Z7982 Long term (current) use of aspirin: Secondary | ICD-10-CM | POA: Diagnosis not present

## 2020-10-23 DIAGNOSIS — L309 Dermatitis, unspecified: Secondary | ICD-10-CM | POA: Diagnosis present

## 2020-10-23 DIAGNOSIS — E119 Type 2 diabetes mellitus without complications: Secondary | ICD-10-CM | POA: Diagnosis not present

## 2020-10-23 DIAGNOSIS — R7881 Bacteremia: Secondary | ICD-10-CM | POA: Diagnosis not present

## 2020-10-23 DIAGNOSIS — E785 Hyperlipidemia, unspecified: Secondary | ICD-10-CM | POA: Diagnosis present

## 2020-10-23 DIAGNOSIS — Z20822 Contact with and (suspected) exposure to covid-19: Secondary | ICD-10-CM | POA: Diagnosis present

## 2020-10-23 DIAGNOSIS — Z79899 Other long term (current) drug therapy: Secondary | ICD-10-CM | POA: Diagnosis not present

## 2020-10-23 DIAGNOSIS — I10 Essential (primary) hypertension: Secondary | ICD-10-CM | POA: Diagnosis present

## 2020-10-23 DIAGNOSIS — E1140 Type 2 diabetes mellitus with diabetic neuropathy, unspecified: Secondary | ICD-10-CM | POA: Diagnosis present

## 2020-10-23 DIAGNOSIS — R6520 Severe sepsis without septic shock: Secondary | ICD-10-CM | POA: Diagnosis present

## 2020-10-23 DIAGNOSIS — F32A Depression, unspecified: Secondary | ICD-10-CM | POA: Diagnosis present

## 2020-10-23 DIAGNOSIS — Z7984 Long term (current) use of oral hypoglycemic drugs: Secondary | ICD-10-CM | POA: Diagnosis not present

## 2020-10-23 LAB — CBC
HCT: 28.2 % — ABNORMAL LOW (ref 36.0–46.0)
Hemoglobin: 9.4 g/dL — ABNORMAL LOW (ref 12.0–15.0)
MCH: 31.1 pg (ref 26.0–34.0)
MCHC: 33.3 g/dL (ref 30.0–36.0)
MCV: 93.4 fL (ref 80.0–100.0)
Platelets: DECREASED 10*3/uL (ref 150–400)
RBC: 3.02 MIL/uL — ABNORMAL LOW (ref 3.87–5.11)
RDW: 14.5 % (ref 11.5–15.5)
WBC: 17.4 10*3/uL — ABNORMAL HIGH (ref 4.0–10.5)
nRBC: 0 % (ref 0.0–0.2)

## 2020-10-23 LAB — BLOOD CULTURE ID PANEL (REFLEXED) - BCID2

## 2020-10-23 LAB — GLUCOSE, CAPILLARY
Glucose-Capillary: 241 mg/dL — ABNORMAL HIGH (ref 70–99)
Glucose-Capillary: 263 mg/dL — ABNORMAL HIGH (ref 70–99)
Glucose-Capillary: 357 mg/dL — ABNORMAL HIGH (ref 70–99)
Glucose-Capillary: 343 mg/dL — ABNORMAL HIGH (ref 70–99)

## 2020-10-23 LAB — BASIC METABOLIC PANEL
Anion gap: 11 (ref 5–15)
BUN: 28 mg/dL — ABNORMAL HIGH (ref 6–20)
CO2: 19 mmol/L — ABNORMAL LOW (ref 22–32)
Calcium: 8.2 mg/dL — ABNORMAL LOW (ref 8.9–10.3)
Chloride: 103 mmol/L (ref 98–111)
Creatinine, Ser: 2.05 mg/dL — ABNORMAL HIGH (ref 0.44–1.00)
GFR, Estimated: 29 mL/min — ABNORMAL LOW (ref >60)
Glucose, Bld: 242 mg/dL — ABNORMAL HIGH (ref 70–99)
Potassium: 4.3 mmol/L (ref 3.5–5.1)
Sodium: 133 mmol/L — ABNORMAL LOW (ref 135–145)

## 2020-10-23 LAB — TSH: TSH: 0.723 u[IU]/mL (ref 0.350–4.500)

## 2020-10-23 LAB — HIV ANTIBODY (ROUTINE TESTING W REFLEX): HIV Screen 4th Generation wRfx: NONREACTIVE

## 2020-10-23 MED ORDER — SODIUM CHLORIDE 0.9 % IV SOLN
2.0000 g | Freq: Once | INTRAVENOUS | Status: AC
  Administered 2020-10-23: 2 g via INTRAVENOUS
  Filled 2020-10-23: qty 20

## 2020-10-23 MED ORDER — SODIUM CHLORIDE 0.9 % IV SOLN
2.0000 g | INTRAVENOUS | Status: DC
  Administered 2020-10-23: 2 g via INTRAVENOUS
  Filled 2020-10-23: qty 20

## 2020-10-23 MED ORDER — GABAPENTIN 100 MG PO CAPS
100.0000 mg | ORAL_CAPSULE | Freq: Three times a day (TID) | ORAL | Status: DC
  Administered 2020-10-23 – 2020-10-24 (×3): 100 mg via ORAL
  Filled 2020-10-23 (×3): qty 1

## 2020-10-23 NOTE — Progress Notes (Signed)
Family Medicine Teaching Service Daily Progress Note Intern Pager: [phone]  Patient name: Christine [name] Medical record number: [id number] Date of birth: 13-Jul-1970 Age: 50 y.o. Gender: female  Primary Care Provider: Gifford Shave, MD Consultants: None Code Status: Full  Pt Overview and Major Events to Date:  6/26 - Admitted 6/27 - Blood culture positive E. Coli  Assessment and Plan: Christine [name] is a 50 y.o. female presenting with back pain and fever . PMH is significant for T2DM, HTN, tobacco use, HLD, chronic pancreatitis, chronic dermatitis  Sepsis  E. Coli bacteremia  Pyelonephritis Blood cultures collected on admission growing E. Coli, Ceftriaxone increased to 2g IV, awaiting sensitivities for eventual transition to oral antibiotics. WBC 5.8 > 17.4, will continue to trend - Continue to monitor blood cultures - Ceftriaxone 2g q24h - Monitor fever curve - Monitor CBC  AKI Creatinine 1.77>2.05. Patient currently has pyelonephritis and has had some improvement in oral intake, will continue fluids at this time. - Continue NS @75mL /h - Monitor with BMP  Type 2 DM CBGs 241-419, patient received 12U Novolog.  - sSSI - CBGs QAC and QHS - Continue home gabapentin - Follow-up A1c  HTN BP 94-135/57-81. Holding home medications in the setting of AKI - Holding home losartan-HCTZ   Anemia Hgb on admission 11.6, currently 9.4. - Consider anemia panel, possibly related to chronic disease   All other conditions per H&P   FEN/GI: carb modified diet PPx: Lovenox Dispo:Home in 2-3 days. Barriers include continued medical work-up.   Subjective:  Patient ports that she is feeling much better today and that her urine color has gotten a lot lighter.  Objective: Temp:  [98.1 F (36.7 C)-98.9 F (37.2 C)] 98.1 F (36.7 C) (06/27 0401) Pulse Rate:  [62-99] 62 (06/27 0401) Resp:  [10-20] 16 (06/27 0401) BP: (94-135)/(57-81) 105/76 (06/27 0401) SpO2:  [96 %-100 %] 100 %  (06/27 0401) Physical Exam: General: NAD, sitting up in bed, well-appearing Cardiovascular: RRR, no murmur appreciated  Respiratory: clear to auscultation bilaterally, no increased work breathing, comfortable on room air Abdomen: Soft, nontender, nondistended. Back: Bilateral CVA tenderness worse on the left. Extremities: Moving all extremities equally and appropriately  Laboratory: Recent Labs  Lab 10/22/20 0717 10/23/20 0458  WBC 5.8 17.4*  HGB 11.6* 9.4*  HCT 35.5* 28.2*  PLT 144* PLATELET CLUMPS NOTED ON SMEAR, COUNT APPEARS DECREASED   Recent Labs  Lab 10/22/20 0717 10/23/20 0458  NA 136 133*  K 4.3 4.3  CL 100 103  CO2 22 19*  BUN 21* 28*  CREATININE 1.77* 2.05*  CALCIUM 9.0 8.2*  PROT 6.5  --   BILITOT 0.5  --   ALKPHOS 133*  --   ALT 15  --   AST 15  --   GLUCOSE 376* 242*     Imaging/Diagnostic Tests: CT Renal Stone Study  Result Date: 10/22/2020 CLINICAL DATA:  Bilateral flank pain for 4 days with fever. EXAM: CT ABDOMEN AND PELVIS WITHOUT CONTRAST TECHNIQUE: Multidetector CT imaging of the abdomen and pelvis was performed following the standard protocol without IV contrast. COMPARISON:  MRI abdomen 02/08/2020. CT abdomen and pelvis 10/05/2019. FINDINGS: Lower chest: Mild dependent atelectasis in both lung bases. No pleural effusion. Decreased attenuation of the blood pool suggesting anemia. Hepatobiliary: No focal liver abnormality is seen. No gallstones, gallbladder wall thickening, or biliary dilatation. Pancreas: Unchanged extensive pancreatic calcification consistent with chronic pancreatitis. No peripancreatic fluid collection or definite acute pancreatic inflammation. Spleen: Unremarkable. Adrenals/Urinary Tract: Unremarkable adrenal glands.  New moderate perinephric stranding bilaterally with the kidneys appearing slightly enlarged/swollen compared to the prior CT. No perinephric fluid collection, renal calculi, or hydroureteronephrosis. Unremarkable  bladder. Stomach/Bowel: The stomach is unremarkable. There is a moderate amount of stool in the rectum. There is no evidence of bowel obstruction. The appendix is unremarkable. Vascular/Lymphatic: Abdominal aortic atherosclerosis without aneurysm. No enlarged lymph nodes. Reproductive: Uterus and bilateral adnexa are unremarkable. Other: No ascites. Musculoskeletal: No acute osseous abnormality or suspicious osseous lesion. IMPRESSION: 1. New bilateral perinephric stranding, query pyelonephritis. No urinary tract calculi or obstruction. 2. Chronic pancreatitis. 3. Aortic Atherosclerosis (ICD10-I70.0). Electronically Signed   By: Logan Bores M.D.   On: 10/22/2020 12:52     Rise Patience, DO 10/23/2020, 9:16 AM PGY-1, [place] Intern pager: [phone], text pages welcome

## 2020-10-23 NOTE — Progress Notes (Signed)
PHARMACY - PHYSICIAN COMMUNICATION CRITICAL VALUE ALERT - BLOOD CULTURE IDENTIFICATION (BCID)  Christine [name] is an 50 y.o. female who presented to [facility] on 10/22/2020 with a chief complaint of fever, chills, lower back pain, and dark urine.  Assessment:  Started on IV ABX for pyelonephritis, now w/ E.coli growing in 3 of 4 blood cx bottles.  Name of physician (or Provider) Contacted: MSimmons-Robinson DO and PManess MD  Current antibiotics: Rocephin  Changes to prescribed antibiotics recommended:  Recommendations accepted by provider -- increase Rocephin to 2g IV Q24H.  Results for orders placed or performed during the hospital encounter of 10/22/20  Blood Culture ID Panel (Reflexed) (Collected: 10/22/2020  9:52 AM)  Result Value Ref Range   Enterococcus faecalis NOT DETECTED NOT DETECTED   Enterococcus Faecium NOT DETECTED NOT DETECTED   Listeria monocytogenes NOT DETECTED NOT DETECTED   Staphylococcus species NOT DETECTED NOT DETECTED   Staphylococcus aureus (BCID) NOT DETECTED NOT DETECTED   Staphylococcus epidermidis NOT DETECTED NOT DETECTED   Staphylococcus lugdunensis NOT DETECTED NOT DETECTED   Streptococcus species NOT DETECTED NOT DETECTED   Streptococcus agalactiae NOT DETECTED NOT DETECTED   Streptococcus pneumoniae NOT DETECTED NOT DETECTED   Streptococcus pyogenes NOT DETECTED NOT DETECTED   A.calcoaceticus-baumannii NOT DETECTED NOT DETECTED   Bacteroides fragilis NOT DETECTED NOT DETECTED   Enterobacterales DETECTED (A) NOT DETECTED   Enterobacter cloacae complex NOT DETECTED NOT DETECTED   Escherichia coli DETECTED (A) NOT DETECTED   Klebsiella aerogenes NOT DETECTED NOT DETECTED   Klebsiella oxytoca NOT DETECTED NOT DETECTED   Klebsiella pneumoniae NOT DETECTED NOT DETECTED   Proteus species NOT DETECTED NOT DETECTED   Salmonella species NOT DETECTED NOT DETECTED   Serratia marcescens NOT DETECTED NOT DETECTED   Haemophilus influenzae NOT DETECTED NOT  DETECTED   Neisseria meningitidis NOT DETECTED NOT DETECTED   Pseudomonas aeruginosa NOT DETECTED NOT DETECTED   Stenotrophomonas maltophilia NOT DETECTED NOT DETECTED   Candida albicans NOT DETECTED NOT DETECTED   Candida auris NOT DETECTED NOT DETECTED   Candida glabrata NOT DETECTED NOT DETECTED   Candida krusei NOT DETECTED NOT DETECTED   Candida parapsilosis NOT DETECTED NOT DETECTED   Candida tropicalis NOT DETECTED NOT DETECTED   Cryptococcus neoformans/gattii NOT DETECTED NOT DETECTED   CTX-M ESBL NOT DETECTED NOT DETECTED   Carbapenem resistance IMP NOT DETECTED NOT DETECTED   Carbapenem resistance KPC NOT DETECTED NOT DETECTED   Carbapenem resistance NDM NOT DETECTED NOT DETECTED   Carbapenem resist OXA 48 LIKE NOT DETECTED NOT DETECTED   Carbapenem resistance VIM NOT DETECTED NOT DETECTED    Wynona Neat, PharmD, BCPS  10/23/2020  1:33 AM

## 2020-10-24 ENCOUNTER — Other Ambulatory Visit (HOSPITAL_COMMUNITY): Payer: Self-pay

## 2020-10-24 DIAGNOSIS — E1159 Type 2 diabetes mellitus with other circulatory complications: Secondary | ICD-10-CM

## 2020-10-24 DIAGNOSIS — I152 Hypertension secondary to endocrine disorders: Secondary | ICD-10-CM

## 2020-10-24 LAB — BASIC METABOLIC PANEL
Anion gap: 7 (ref 5–15)
BUN: 27 mg/dL — ABNORMAL HIGH (ref 6–20)
CO2: 17 mmol/L — ABNORMAL LOW (ref 22–32)
Calcium: 8.5 mg/dL — ABNORMAL LOW (ref 8.9–10.3)
Chloride: 108 mmol/L (ref 98–111)
Creatinine, Ser: 1.9 mg/dL — ABNORMAL HIGH (ref 0.44–1.00)
GFR, Estimated: 32 mL/min — ABNORMAL LOW (ref >60)
Glucose, Bld: 214 mg/dL — ABNORMAL HIGH (ref 70–99)
Potassium: 3.9 mmol/L (ref 3.5–5.1)
Sodium: 132 mmol/L — ABNORMAL LOW (ref 135–145)

## 2020-10-24 LAB — URINALYSIS, ROUTINE W REFLEX MICROSCOPIC
Bilirubin Urine: NEGATIVE
Glucose, UA: 150 mg/dL — AB
Ketones, ur: NEGATIVE mg/dL
Nitrite: NEGATIVE
Protein, ur: NEGATIVE mg/dL
Specific Gravity, Urine: 1.005 (ref 1.005–1.030)
pH: 5 (ref 5.0–8.0)

## 2020-10-24 LAB — CULTURE, BLOOD (ROUTINE X 2): Special Requests: ADEQUATE

## 2020-10-24 LAB — CBC
HCT: 26.1 % — ABNORMAL LOW (ref 36.0–46.0)
Hemoglobin: 8.7 g/dL — ABNORMAL LOW (ref 12.0–15.0)
MCH: 30.6 pg (ref 26.0–34.0)
MCHC: 33.3 g/dL (ref 30.0–36.0)
MCV: 91.9 fL (ref 80.0–100.0)
Platelets: DECREASED 10*3/uL (ref 150–400)
RBC: 2.84 MIL/uL — ABNORMAL LOW (ref 3.87–5.11)
RDW: 14.6 % (ref 11.5–15.5)
WBC: 13.2 10*3/uL — ABNORMAL HIGH (ref 4.0–10.5)
nRBC: 0 % (ref 0.0–0.2)

## 2020-10-24 LAB — HEMOGLOBIN A1C
Hgb A1c MFr Bld: 7.9 % — ABNORMAL HIGH (ref 4.8–5.6)
Mean Plasma Glucose: 180 mg/dL

## 2020-10-24 LAB — GLUCOSE, CAPILLARY
Glucose-Capillary: 199 mg/dL — ABNORMAL HIGH (ref 70–99)
Glucose-Capillary: 274 mg/dL — ABNORMAL HIGH (ref 70–99)

## 2020-10-24 MED ORDER — CEFAZOLIN SODIUM-DEXTROSE 2-4 GM/100ML-% IV SOLN
2.0000 g | Freq: Three times a day (TID) | INTRAVENOUS | Status: DC
  Filled 2020-10-24 (×2): qty 100

## 2020-10-24 MED ORDER — GABAPENTIN 100 MG PO CAPS: 100.0000 mg | ORAL_CAPSULE | Freq: Three times a day (TID) | ORAL | 0 refills | Status: DC

## 2020-10-24 MED ORDER — CEPHALEXIN 500 MG PO CAPS
500.0000 mg | ORAL_CAPSULE | Freq: Four times a day (QID) | ORAL | 0 refills | Status: AC
  Filled 2020-10-24: qty 16, 4d supply, fill #0

## 2020-10-24 NOTE — Discharge Summary (Signed)
[facility] Discharge Summary  Patient name: Christine [name] Medical record number: [id number] Date of birth: 1971-04-15 Age: 50 y.o. Gender: female Date of Admission: 10/22/2020  Date of Discharge: 10/24/2020 Admitting Physician: Lind Covert, MD  Primary Care Provider: Gifford Shave, MD Consultants: ID  Indication for Hospitalization: Pyelonephritis   Discharge Diagnoses/Problem List:  Patient Active Problem List   Diagnosis Date Noted   Bacteremia, escherichia coli    Leukocytosis    Type 2 diabetes mellitus without complication, without long-term current use of insulin ([place])    Pyelonephritis 10/22/2020   Hyperkalemia 02/09/2020   Underweight 12/14/2019   Abnormal ankle brachial index (ABI) 10/06/2019   Shortness of breath 07/15/2019   Tachycardia 07/15/2019   Long term (current) use of systemic steroids 05/12/2019   Esophageal candidiasis ([place]) 03/12/2019   Fatigue 12/14/2018   Chronic cough 07/28/2018   Alcohol use 03/30/2018   Abnormal weight loss 01/13/2017   Chronic contact dermatitis 11/19/2016   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) 11/05/2013   Vitamin D deficiency 11/01/2013   Essential hypertension, benign 12/25/2012   Chronic alcoholic  pancreatitis 05/01/7251   Protein-calorie malnutrition, severe ([place]) 11/18/2012   Depression 09/29/2012   Neuropathic pain 01/29/2011   DRUG ABUSE, HX OF 07/05/2009   ANEMIA 09/28/2008   HYPERLIPIDEMIA 09/12/2008   Tobacco use disorder 09/12/2008   Diabetes mellitus with neuropathy ([place]) 06/26/2006     Disposition: Home  Discharge Condition: Stable, improved  Discharge Exam:  General: NAD, sitting up in bed, well-appearing Cardiovascular: RRR, no murmur appreciated Respiratory: Breathing abdomen room air, no increased work of breathing, clear to auscultation bilaterally Abdomen: Soft, nontender, nondistended Back: Minimal CVA tenderness on left side, significantly  improved from prior exams Extremities: Moving all extremities equally and appropriately  Brief Hospital Course:  AVALYN [name] is a 50 y.o. female presenting with back pain and fever . PMH is significant for T2DM, HTN, tobacco use, HLD, chronic pancreatitis, chronic dermatitis  Pyelonephritis, meeting sepsis criteria Patient admitted with back pain for several days with high fevers and decreased oral intake.  On admission, patient was initially febrile, tachycardic with elevated lactic acid and UA significant for signs of UTI.  Renal stone study showed perinephric stranding consistent with pyelonephritis.  Blood and urine cultures obtained and patient was started on ceftriaxone.  Blood cultures resulted positive for E. coli and ID was consulted.  Patient was started on Ancef and transitioned to oral Keflex, which is continued even the patient did have an elevated creatinine and lower GFR as this was related to acute kidney infection and does not appear to be a chronic condition.  Patient scheduled with close follow-up in our clinic.  AKI Patient was admitted to creatinine of 1.77, baseline appears to be around 0.8-1.0.  In the ED, patient received 2 L total IVF and was kept on fluids with significant improvement.  Creatinine upon discharge was 1.9, BUN/creatinine ratio of 14.  Unlikely related to any concern for dehydration and patient currently does have an active kidney infection.  Type II diabetes A1c on admission 7.9.  Patient was placed on sliding scale insulin with CBG monitoring.  Home gabapentin was continued.  Patient was Continued on insulin upon discharge and was continued on home medications.  HTN Held home losartan-HCTZ due to AKI during hospitalization. Patient blood pressures were normotensive overall with systolics averaging in the 130s. Was held upon discharge, to be restarted at close outpatient follow up if kidneys appropriately improved.   Other  chronic conditions were stable  during hospitalization.  Issues for follow-up: Repeat BMP on outpatient follow-up to monitor creatinine improvement. Patient continued on Keflex for total of 7 antibiotic days, ensure creatinine improves or consider adjustment of medications if indicated based on labs.  Recommend patient not take any BC or Goody powders and currently to avoid NSAIDs Resume BP medication (losartan-hctz) if creatinine improved, held during admission due to AKI and held upon discharge.   Significant Procedures: None  Significant Labs and Imaging:  Recent Labs  Lab 10/22/20 0717 10/23/20 0458 10/24/20 0357  WBC 5.8 17.4* 13.2*  HGB 11.6* 9.4* 8.7*  HCT 35.5* 28.2* 26.1*  PLT 144* PLATELET CLUMPS NOTED ON SMEAR, COUNT APPEARS DECREASED PLATELET CLUMPS NOTED ON SMEAR, COUNT APPEARS DECREASED   Recent Labs  Lab 10/22/20 0717 10/23/20 0458 10/24/20 0357  NA 136 133* 132*  K 4.3 4.3 3.9  CL 100 103 108  CO2 22 19* 17*  GLUCOSE 376* 242* 214*  BUN 21* 28* 27*  CREATININE 1.77* 2.05* 1.90*  CALCIUM 9.0 8.2* 8.5*  ALKPHOS 133*  --   --   AST 15  --   --   ALT 15  --   --   ALBUMIN 3.1*  --   --     CT Renal Stone Study  Result Date: 10/22/2020 CLINICAL DATA:  Bilateral flank pain for 4 days with fever. EXAM: CT ABDOMEN AND PELVIS WITHOUT CONTRAST TECHNIQUE: Multidetector CT imaging of the abdomen and pelvis was performed following the standard protocol without IV contrast. COMPARISON:  MRI abdomen 02/08/2020. CT abdomen and pelvis 10/05/2019. FINDINGS: Lower chest: Mild dependent atelectasis in both lung bases. No pleural effusion. Decreased attenuation of the blood pool suggesting anemia. Hepatobiliary: No focal liver abnormality is seen. No gallstones, gallbladder wall thickening, or biliary dilatation. Pancreas: Unchanged extensive pancreatic calcification consistent with chronic pancreatitis. No peripancreatic fluid collection or definite acute pancreatic inflammation. Spleen: Unremarkable.  Adrenals/Urinary Tract: Unremarkable adrenal glands. New moderate perinephric stranding bilaterally with the kidneys appearing slightly enlarged/swollen compared to the prior CT. No perinephric fluid collection, renal calculi, or hydroureteronephrosis. Unremarkable bladder. Stomach/Bowel: The stomach is unremarkable. There is a moderate amount of stool in the rectum. There is no evidence of bowel obstruction. The appendix is unremarkable. Vascular/Lymphatic: Abdominal aortic atherosclerosis without aneurysm. No enlarged lymph nodes. Reproductive: Uterus and bilateral adnexa are unremarkable. Other: No ascites. Musculoskeletal: No acute osseous abnormality or suspicious osseous lesion. IMPRESSION: 1. New bilateral perinephric stranding, query pyelonephritis. No urinary tract calculi or obstruction. 2. Chronic pancreatitis. 3. Aortic Atherosclerosis (ICD10-I70.0). Electronically Signed   By: Logan Bores M.D.   On: 10/22/2020 12:52     Results/Tests Pending at Time of Discharge: None  Discharge Medications:  Allergies as of 10/24/2020   No Known Allergies      Medication List     STOP taking these medications    Accu-Chek Softclix Lancets lancets   Chantix 1 MG tablet Generic drug: varenicline   FLUoxetine 20 MG capsule Commonly known as: PROZAC   losartan-hydrochlorothiazide 50-12.5 MG tablet Commonly known as: HYZAAR   nicotine 14 mg/24hr patch Commonly known as: NICODERM CQ - dosed in mg/24 hours   SM Nicotine Polacrilex 2 MG gum Generic drug: nicotine polacrilex       TAKE these medications    acetaminophen 500 MG tablet Commonly known as: TYLENOL Take 1,000 mg by mouth every 6 (six) hours as needed for mild pain.   aspirin EC 81 MG tablet Take 1 tablet (  81 mg total) by mouth daily. Swallow whole.   cephALEXin 500 MG capsule Commonly known as: KEFLEX Take 1 capsule (500 mg total) by mouth 4 (four) times daily for 4 days.   Creon 36000 UNITS Cpep capsule Generic  drug: lipase/protease/amylase TAKE 1 CAPSULE BY MOUTH THREE TIMES A DAY BEFORE MEALS What changed: See the new instructions.   gabapentin 100 MG capsule Commonly known as: NEURONTIN Take 1 capsule (100 mg total) by mouth 3 (three) times daily. What changed:  medication strength See the new instructions.   metFORMIN 1000 MG tablet Commonly known as: GLUCOPHAGE TAKE 1 TABLET BY MOUTH EVERY DAY WITH BREAKFAST What changed: See the new instructions.   predniSONE 5 MG tablet Commonly known as: DELTASONE TAKE 2 TABLETS EVERY OTHER DAY, ALTERNATING WITH 2 AND A HALF EVERY OTHER DAY FOR 1 MONTH. What changed: See the new instructions.   rosuvastatin 10 MG tablet Commonly known as: CRESTOR TAKE 1 TABLET BY MOUTH EVERY DAY        Discharge Instructions: Please refer to Patient Instructions section of EMR for full details.  Patient was counseled important signs and symptoms that should prompt return to medical care, changes in medications, dietary instructions, activity restrictions, and follow up appointments.   Follow-Up Appointments:  Follow-up [place]. Go on 10/27/2020.   Specialty: Family Medicine Why: Your appointment is at 9:10am, please arrive 15 minutes early. Contact information: [address] [id number] mc [address] [place] [address] [place], [place], DO 10/24/2020, 2:36 PM PGY-1, [place]

## 2020-10-24 NOTE — Progress Notes (Signed)
Inpatient Diabetes Program Recommendations  AACE/ADA: New Consensus Statement on Inpatient Glycemic Control (2015)  Target Ranges:  Prepandial:   less than 140 mg/dL      Peak postprandial:   less than 180 mg/dL (1-2 hours)      Critically ill patients:  140 - 180 mg/dL   Lab Results  Component Value Date   GLUCAP 199 (H) 10/24/2020   HGBA1C 7.9 (H) 10/23/2020    Review of Glycemic Control Results for Christine [name], Christine [name] (MRN [id number]) as of 10/24/2020 09:51  Ref. Range 10/23/2020 11:02 10/23/2020 16:10 10/23/2020 20:54 10/24/2020 06:09  Glucose-Capillary Latest Ref Range: 70 - 99 mg/dL 263 (H) 357 (H) 343 (H) 199 (H)   Diabetes history: Type 2 DM Outpatient Diabetes medications: Metformin 1000 mg QD Current orders for Inpatient glycemic control: Novolog 0-9 units TID Prednisone 10 mg QD  Inpatient Diabetes Program Recommendations:    Consider: -Levemir 8 units QD -Novolog 0-15 units TID & HS -Novolog 3 units TID (assuming pt consuming >50% of meals).  Thanks,  Bronson Curb, MSN, RNC-OB Diabetes Coordinator [phone] (8a-5p)

## 2020-10-24 NOTE — Consult Note (Signed)
[facility]       Reason for Consult:bacteremia - GNR    Referring Physician: CHAMP autoconsult  Active Problems:   Pyelonephritis   Bacteremia, escherichia coli   Leukocytosis   Type 2 diabetes mellitus without complication, without long-term current use of insulin ([facility])    enoxaparin (LOVENOX) injection  40 mg Subcutaneous Q24H   gabapentin  100 mg Oral TID   insulin aspart  0-9 Units Subcutaneous TID WC   lipase/protease/amylase  36,000 Units Oral TID AC   nicotine  14 mg Transdermal Daily   predniSONE  10 mg Oral Daily   rosuvastatin  10 mg Oral Daily    Recommendations: 7 days of antibiotic treatment through July 2nd Will narrow ceftriaxone to cefazolin Transition to oral cephalexin 500 mg 4 times a day or Augmentin 875 mg twice a day at discharge  Assessment: She has pyelonephritis with positive blood cultures with relatively rapid improvement of symptoms within 48 hours of starting treatment.    AKI - slow improvement in creat with fluids.    Antibiotics: Ceftriaxone 2 days  HPI: Christine [name] is a 50 y.o. female with a history of diabetes came in with back pain, fever, chills and found to have pyelonephritis based on symptoms, positive blood cultures and CT findings.  Culture grew E coli, resistant only to ampicillin and she has received 2 days of ceftriaxone.  Her Tmax was 102.9 and has been afebrile over > 24 hours.  No further back pain and WBC down to 13.2 from 17.4.     Review of Systems:  Constitutional: positive for fevers, chills, and fatigue Respiratory: negative for cough Gastrointestinal: negative for nausea and diarrhea Musculoskeletal: positive for back pain, negative for myalgias and arthralgias All other systems reviewed and are negative    Past Medical History:  Diagnosis Date   Alcohol abuse    Cellulitis 11/2016   RIGHT ARM   Chronic pancreatitis ([place])    Diabetes mellitus    Esophageal candidiasis ([place])  03/12/2019   Hypertension    Substance abuse ([place])     Social History   Tobacco Use   Smoking status: Every Day    Packs/day: 1.00    Years: 22.00    Pack years: 22.00    Types: Cigarettes   Smokeless tobacco: Never   Tobacco comments:    working on quitting  Vaping Use   Vaping Use: Never used  Substance Use Topics   Alcohol use: Yes    Alcohol/week: 0.0 standard drinks    Comment: 11/2016 " i DRINK LESS THAN i USED TO,BUT i DRINK BEER EVERYDAY   Drug use: Not Currently    Comment: last use two weeks ago    History reviewed. No pertinent family history.  No Known Allergies  Physical Exam: Constitutional: in no apparent distress  Vitals:   10/24/20 0036 10/24/20 0530  BP: 122/74 137/73  Pulse: 63 63  Resp: 18 18  Temp: 98 F (36.7 C) 98 F (36.7 C)  SpO2: 100% 97%   EYES: anicteric ENMT: no thrush Cardiovascular: Cor RRR Respiratory: clear; GI: Bowel sounds are normal, liver is not enlarged, spleen is not enlarged Musculoskeletal: no pedal edema noted Skin: negatives: no rash Neuro: non-focal  Lab Results  Component Value Date   WBC 13.2 (H) 10/24/2020   HGB 8.7 (L) 10/24/2020   HCT 26.1 (L) 10/24/2020   MCV 91.9 10/24/2020   PLT  10/24/2020    PLATELET CLUMPS  NOTED ON SMEAR, COUNT APPEARS DECREASED    Lab Results  Component Value Date   CREATININE 1.90 (H) 10/24/2020   BUN 27 (H) 10/24/2020   NA 132 (L) 10/24/2020   K 3.9 10/24/2020   CL 108 10/24/2020   CO2 17 (L) 10/24/2020    Lab Results  Component Value Date   ALT 15 10/22/2020   AST 15 10/22/2020   ALKPHOS 133 (H) 10/22/2020     Microbiology: Recent Results (from the past 240 hour(s))  Blood culture (routine x 2)     Status: None (Preliminary result)   Collection Time: 10/22/20  9:47 AM   Specimen: BLOOD RIGHT FOREARM  Result Value Ref Range Status   Specimen Description BLOOD RIGHT FOREARM  Final   Special Requests   Final    BOTTLES DRAWN AEROBIC AND ANAEROBIC Blood Culture  results may not be optimal due to an inadequate volume of blood received in culture bottles   Culture  Setup Time   Final    GRAM NEGATIVE RODS AEROBIC BOTTLE ONLY CRITICAL VALUE NOTED.  VALUE IS CONSISTENT WITH PREVIOUSLY REPORTED AND CALLED VALUE.    Culture   Final    GRAM NEGATIVE RODS IDENTIFICATION TO FOLLOW Performed at [facility], [place] [address] [place], [place] [address]    Report Status PENDING  Incomplete  Blood culture (routine x 2)     Status: Abnormal   Collection Time: 10/22/20  9:52 AM   Specimen: BLOOD LEFT FOREARM  Result Value Ref Range Status   Specimen Description BLOOD LEFT FOREARM  Final   Special Requests   Final    BOTTLES DRAWN AEROBIC AND ANAEROBIC Blood Culture adequate volume   Culture  Setup Time   Final    GRAM NEGATIVE RODS IN BOTH AEROBIC AND ANAEROBIC BOTTLES CRITICAL RESULT CALLED TO, READ BACK BY AND VERIFIED WITH: Alona Bene PHARMD, [place] 10/23/20 D, VANHOOK Performed at [facility], [place] [address], [place],  [address]    Culture ESCHERICHIA COLI (A)  Final   Report Status 10/24/2020 FINAL  Final   Organism ID, Bacteria ESCHERICHIA COLI  Final      Susceptibility   Escherichia coli - MIC*    AMPICILLIN >=32 RESISTANT Resistant     CEFAZOLIN <=4 SENSITIVE Sensitive     CEFEPIME <=0.12 SENSITIVE Sensitive     CEFTAZIDIME <=1 SENSITIVE Sensitive     CEFTRIAXONE <=0.25 SENSITIVE Sensitive     CIPROFLOXACIN <=0.25 SENSITIVE Sensitive     GENTAMICIN <=1 SENSITIVE Sensitive     IMIPENEM <=0.25 SENSITIVE Sensitive     TRIMETH/SULFA <=20 SENSITIVE Sensitive     AMPICILLIN/SULBACTAM 8 SENSITIVE Sensitive     PIP/TAZO <=4 SENSITIVE Sensitive     * ESCHERICHIA COLI  Blood Culture ID Panel (Reflexed)     Status: Abnormal   Collection Time: 10/22/20  9:52 AM  Result Value Ref Range Status   Enterococcus faecalis NOT DETECTED NOT DETECTED Final   Enterococcus Faecium NOT DETECTED NOT DETECTED Final   Listeria monocytogenes NOT  DETECTED NOT DETECTED Final   Staphylococcus species NOT DETECTED NOT DETECTED Final   Staphylococcus aureus (BCID) NOT DETECTED NOT DETECTED Final   Staphylococcus epidermidis NOT DETECTED NOT DETECTED Final   Staphylococcus lugdunensis NOT DETECTED NOT DETECTED Final   Streptococcus species NOT DETECTED NOT DETECTED Final   Streptococcus agalactiae NOT DETECTED NOT DETECTED Final   Streptococcus pneumoniae NOT DETECTED NOT DETECTED Final   Streptococcus pyogenes NOT DETECTED NOT DETECTED  Final   A.calcoaceticus-baumannii NOT DETECTED NOT DETECTED Final   Bacteroides fragilis NOT DETECTED NOT DETECTED Final   Enterobacterales DETECTED (A) NOT DETECTED Final    Comment: Enterobacterales represent a large order of gram negative bacteria, not a single organism. CRITICAL RESULT CALLED TO, READ BACK BY AND VERIFIED WITH: V. BRYK PHARMD, 0128 10/23/20 D, VANHOOK    Enterobacter cloacae complex NOT DETECTED NOT DETECTED Final   Escherichia coli DETECTED (A) NOT DETECTED Final    Comment: CRITICAL RESULT CALLED TO, READ BACK BY AND VERIFIED WITH: V. BRYK PHARMD, 0128 10/23/20 D, VANHOOK    Klebsiella aerogenes NOT DETECTED NOT DETECTED Final   Klebsiella oxytoca NOT DETECTED NOT DETECTED Final   Klebsiella pneumoniae NOT DETECTED NOT DETECTED Final   Proteus species NOT DETECTED NOT DETECTED Final   Salmonella species NOT DETECTED NOT DETECTED Final   Serratia marcescens NOT DETECTED NOT DETECTED Final   Haemophilus influenzae NOT DETECTED NOT DETECTED Final   Neisseria meningitidis NOT DETECTED NOT DETECTED Final   Pseudomonas aeruginosa NOT DETECTED NOT DETECTED Final   Stenotrophomonas maltophilia NOT DETECTED NOT DETECTED Final   Candida albicans NOT DETECTED NOT DETECTED Final   Candida auris NOT DETECTED NOT DETECTED Final   Candida glabrata NOT DETECTED NOT DETECTED Final   Candida krusei NOT DETECTED NOT DETECTED Final   Candida parapsilosis NOT DETECTED NOT DETECTED Final    Candida tropicalis NOT DETECTED NOT DETECTED Final   Cryptococcus neoformans/gattii NOT DETECTED NOT DETECTED Final   CTX-M ESBL NOT DETECTED NOT DETECTED Final   Carbapenem resistance IMP NOT DETECTED NOT DETECTED Final   Carbapenem resistance KPC NOT DETECTED NOT DETECTED Final   Carbapenem resistance NDM NOT DETECTED NOT DETECTED Final   Carbapenem resist OXA 48 LIKE NOT DETECTED NOT DETECTED Final   Carbapenem resistance VIM NOT DETECTED NOT DETECTED Final    Comment: Performed at [facility], [address], [place], [place] [address]  Resp Panel by RT-PCR (Flu A&B, Covid) Nasopharyngeal Swab     Status: None   Collection Time: 10/22/20 10:30 AM   Specimen: Nasopharyngeal Swab; Nasopharyngeal(NP) swabs in vial transport medium  Result Value Ref Range Status   SARS Coronavirus 2 by RT PCR NEGATIVE NEGATIVE Final    Comment: (NOTE) SARS-CoV-2 target nucleic acids are NOT DETECTED.  The SARS-CoV-2 RNA is generally detectable in upper respiratory specimens during the acute phase of infection. The lowest concentration of SARS-CoV-2 viral copies this assay can detect is 138 copies/mL. A negative result does not preclude SARS-Cov-2 infection and should not be used as the sole basis for treatment or other patient management decisions. A negative result may occur with  improper specimen collection/handling, submission of specimen other than nasopharyngeal swab, presence of viral mutation(s) within the areas targeted by this assay, and inadequate number of viral copies(<138 copies/mL). A negative result must be combined with clinical observations, patient history, and epidemiological information. The expected result is Negative.  Fact Sheet for Patients:  EntrepreneurPulse.com.au  Fact Sheet for Healthcare Providers:  IncredibleEmployment.be  This test is no t yet approved or cleared by the [place] FDA and  has been authorized for  detection and/or diagnosis of SARS-CoV-2 by FDA under an Emergency Use Authorization (EUA). This EUA will remain  in effect (meaning this test can be used) for the duration of the COVID-19 declaration under Section 564(b)(1) of the Act, 21 U.S.C.section 360bbb-3(b)(1), unless the authorization is terminated  or revoked sooner.  Influenza A by PCR NEGATIVE NEGATIVE Final   Influenza B by PCR NEGATIVE NEGATIVE Final    Comment: (NOTE) The Xpert Xpress SARS-CoV-2/FLU/RSV plus assay is intended as an aid in the diagnosis of influenza from Nasopharyngeal swab specimens and should not be used as a sole basis for treatment. Nasal washings and aspirates are unacceptable for Xpert Xpress SARS-CoV-2/FLU/RSV testing.  Fact Sheet for Patients: EntrepreneurPulse.com.au  Fact Sheet for Healthcare Providers: IncredibleEmployment.be  This test is not yet approved or cleared by the [place] FDA and has been authorized for detection and/or diagnosis of SARS-CoV-2 by FDA under an Emergency Use Authorization (EUA). This EUA will remain in effect (meaning this test can be used) for the duration of the COVID-19 declaration under Section 564(b)(1) of the Act, 21 U.S.C. section 360bbb-3(b)(1), unless the authorization is terminated or revoked.  Performed at [facility], [place] [address], [place], [place] [address]     Christine [name] Naelani Lafrance, [facility] [email or web address] 10/24/2020, 10:42 AM

## 2020-10-24 NOTE — Discharge Instructions (Signed)
You were hospitalized with a kidney infection that also led to an infection in your blood.  You were treated with antibiotics with significant improvement.  Your kidneys had a little bit of injury to it and will be very important to hydrate well with enough water throughout the day and to make sure to avoid Goody powders, BC powders, and NSAIDs in general for a while as this can be harmful for the kidneys.

## 2020-10-24 NOTE — Progress Notes (Signed)
Patient has ordered for discharge. Given discharge instructions with paper to the patient. Iv removed. Given all belongings to the patient. 

## 2020-10-24 NOTE — Progress Notes (Signed)
Family Medicine Teaching Service Daily Progress Note Intern Pager: [phone]  Patient name: Christine [name] Medical record number: [id number] Date of birth: 25-Apr-1971 Age: 50 y.o. Gender: female  Primary Care Provider: Gifford Shave, MD Consultants: ID Code Status: Full  Pt Overview and Major Events to Date:  6/26 - Admitted 6/27 - Blood culture positive E. Coli   Assessment and Plan: Christine [name] is a 50 y.o. female presenting with back pain and fever . PMH is significant for T2DM, HTN, tobacco use, HLD, chronic pancreatitis, chronic dermatitis  Sepsis  E. Coli bacteremia  Pyelonephritis Blood culture resulted back with E. Coli, currently on CTX and this is patient's second dose of 2 g (initially started with 1 g for pyelonephritis treatment and then found to be bacteremic). Susceptibilities with large sensitivities to most antibiotics, will consider de-escalation to oral antibiotics.  - Continue CTX 2g q24h - Monitor fever curve - Monitor CBC  AKI, improving Cr 2.05 >1.90. Will continue fluids for the moment, but will consider discontinuing if oral intake significantly improved over the day - Continue NS @75mL /h  Type 2 DM CBGs 199-357, patient received 21U Novolog, currently on sensitive sliding scale - CBGs before every meal and nightly -Consider increasing to moderate SSI  HTN BP 101-137/63-74. Currently holding home losartan-hctz in the setting of AKI - Holding home Losartan-HCTZ - Continue to monitor BP  FEN/GI: carb modified PPx: Lovenox Dispo:Home in 2-3 days. Barriers include continued medical management.   Subjective:  Patient was called this morning and states that she has not had any fevers overnight, she overall feels much improved.  Objective: Temp:  [97.5 F (36.4 C)-98.3 F (36.8 C)] 98 F (36.7 C) (06/28 0530) Pulse Rate:  [63-72] 63 (06/28 0530) Resp:  [16-18] 18 (06/28 0530) BP: (101-137)/(63-74) 137/73 (06/28 0530) SpO2:  [97 %-100 %] 97 %  (06/28 0530) Physical Exam: General: NAD, sitting up in bed, well-appearing Cardiovascular: RRR, no murmur appreciated Respiratory: Breathing abdomen room air, no increased work of breathing, clear to auscultation bilaterally Abdomen: Soft, nontender, nondistended Back: Minimal CVA tenderness on left side, significantly improved from prior exams Extremities: Moving all extremities equally and appropriately  Laboratory: Recent Labs  Lab 10/22/20 0717 10/23/20 0458 10/24/20 0357  WBC 5.8 17.4* 13.2*  HGB 11.6* 9.4* 8.7*  HCT 35.5* 28.2* 26.1*  PLT 144* PLATELET CLUMPS NOTED ON SMEAR, COUNT APPEARS DECREASED PLATELET CLUMPS NOTED ON SMEAR, COUNT APPEARS DECREASED   Recent Labs  Lab 10/22/20 0717 10/23/20 0458 10/24/20 0357  NA 136 133* 132*  K 4.3 4.3 3.9  CL 100 103 108  CO2 22 19* 17*  BUN 21* 28* 27*  CREATININE 1.77* 2.05* 1.90*  CALCIUM 9.0 8.2* 8.5*  PROT 6.5  --   --   BILITOT 0.5  --   --   ALKPHOS 133*  --   --   ALT 15  --   --   AST 15  --   --   GLUCOSE 376* 242* 214*      Imaging/Diagnostic Tests: No results found.   Rise Patience, DO 10/24/2020, 8:53 AM PGY-1, [place] Intern pager: [phone], text pages welcome

## 2020-10-25 LAB — CULTURE, BLOOD (ROUTINE X 2)

## 2020-10-26 NOTE — Progress Notes (Signed)
    SUBJECTIVE:   CHIEF COMPLAINT / HPI:   Christine [name] is a 50 yo F who presents for the following.  Hospital follow up, pyelonephritis  Hx of AKI Doing well since leaving the hospital. Denies back pain. Has 1 or 2 days left to finish her antibiotics. Resumed her blood pressure medication. Was not aware she was asked to stop taking it due to her kidney function. Would like to return to work.   PERTINENT  PMH / PSH: HTN, DM  OBJECTIVE:   BP 127/75   Pulse 79   Wt 145 lb (65.8 kg)   SpO2 98%   BMI 21.41 kg/m   General: Appears well, no acute distress. Age appropriate. Cardiac: RRR, blowing systolic murmur heard best at LSB Respiratory: CTAB, normal effort MSK/Extremities: No edema or cyanosis. No costovertebral angle tenderness.   ASSESSMENT/PLAN:   No problem-specific Assessment & Plan notes found for this encounter. 1. Hospital discharge follow-up Admitted 6/26 for pyelonephritis. Discharged 6/28. Doing well. Will continue Keflex for a total of 4 days after discharge from the hospital.  -Work note written  2. Essential hypertension, benign BP at goal today. Continued taking hyzaar despite discharge instructions to stop medication. Will need to consider when to resume depending on kidney function as below.   3. Acute kidney injury ([place]) Discharge Cr 1.9. Asked to stop taking hyzaar but patient continued medication. Discussed discontinuation today until labs return. Patient voiced understanding - f/u Basic metabolic panel  Gerlene Fee, [place]

## 2020-10-26 NOTE — Patient Instructions (Addendum)
It was wonderful to see you today.  Please bring ALL of your medications with you to every visit.   Today we talked about:  Stop taking Hyzaar (losartan-HCTZ).  If you start to experience symptoms of high blood pressure please call us.  As discussed we will look at your kidney function and I will give you a call as to whether to restart this medication.  Please call the clinic at [phone] if your symptoms worsen or you have any concerns. It was our pleasure to serve you.  Dr. Janus Molder

## 2020-10-27 ENCOUNTER — Ambulatory Visit (INDEPENDENT_AMBULATORY_CARE_PROVIDER_SITE_OTHER): Payer: 59 | Admitting: Family Medicine

## 2020-10-27 ENCOUNTER — Telehealth: Payer: Self-pay

## 2020-10-27 ENCOUNTER — Other Ambulatory Visit: Payer: Self-pay

## 2020-10-27 VITALS — BP 127/75 | HR 79 | Wt 145.0 lb

## 2020-10-27 DIAGNOSIS — I10 Essential (primary) hypertension: Secondary | ICD-10-CM

## 2020-10-27 DIAGNOSIS — N179 Acute kidney failure, unspecified: Secondary | ICD-10-CM

## 2020-10-27 DIAGNOSIS — Z09 Encounter for follow-up examination after completed treatment for conditions other than malignant neoplasm: Secondary | ICD-10-CM

## 2020-10-27 NOTE — Telephone Encounter (Signed)
Patient seen in clinic this AM with Dr. Erskine Speed- [facility] Drafts. Patient states that insurance does not cover creon rx. However, per insurance formulary Creon [id number] dr capsules are covered by insurance.   Please advise if rx can be changed to dr capsules.   Talbot Grumbling, RN

## 2020-10-28 LAB — BASIC METABOLIC PANEL
BUN/Creatinine Ratio: 9 (ref 9–23)
BUN: 14 mg/dL (ref 6–24)
CO2: 20 mmol/L (ref 20–29)
Calcium: 9.4 mg/dL (ref 8.7–10.2)
Chloride: 105 mmol/L (ref 96–106)
Creatinine, Ser: 1.49 mg/dL — ABNORMAL HIGH (ref 0.57–1.00)
Glucose: 349 mg/dL — ABNORMAL HIGH (ref 65–99)
Potassium: 4.3 mmol/L (ref 3.5–5.2)
Sodium: 140 mmol/L (ref 134–144)
eGFR: 43 mL/min/{1.73_m2} — ABNORMAL LOW (ref >59)

## 2020-10-31 NOTE — Telephone Encounter (Signed)
Called pharmacy. Pharmacist reports that PA is still required. PA submitted via covermymeds.  Response may take up to 72 hours.   Covermymeds Key: [id number]

## 2020-11-01 NOTE — Telephone Encounter (Signed)
Received fax from pharmacy requesting that medication be changed to Zenpep capsules. If not, insurance needs compelling support for clinical rationale of contraindication.   If appropriate, please send new rx to patient's pharmacy.   Talbot Grumbling, RN

## 2020-11-03 MED ORDER — ZENPEP 40000-126000 UNITS PO CPEP: 1.0000 | ORAL_CAPSULE | Freq: Three times a day (TID) | ORAL | 3 refills | Status: DC

## 2020-11-03 NOTE — Telephone Encounter (Signed)
Creon discontinued and patient started on Zenpep at most equivalent dose.

## 2020-11-21 ENCOUNTER — Telehealth: Payer: Self-pay

## 2020-11-21 NOTE — Telephone Encounter (Signed)
Patient LVM on nurse line requesting to speak with nurse. Called patient. Patient unable to talk at this time. Patient states that she will call us back when she gets off of work this afternoon.   Talbot Grumbling, RN

## 2020-12-08 ENCOUNTER — Ambulatory Visit (INDEPENDENT_AMBULATORY_CARE_PROVIDER_SITE_OTHER): Payer: 59

## 2020-12-08 ENCOUNTER — Other Ambulatory Visit: Payer: Self-pay

## 2020-12-08 DIAGNOSIS — Z23 Encounter for immunization: Secondary | ICD-10-CM

## 2020-12-24 ENCOUNTER — Other Ambulatory Visit: Payer: Self-pay | Admitting: Vascular Surgery

## 2021-01-05 ENCOUNTER — Other Ambulatory Visit: Payer: Self-pay | Admitting: Family Medicine

## 2021-01-09 ENCOUNTER — Other Ambulatory Visit: Payer: Self-pay | Admitting: Family Medicine

## 2021-01-31 ENCOUNTER — Other Ambulatory Visit: Payer: Self-pay | Admitting: Family Medicine

## 2021-02-22 ENCOUNTER — Ambulatory Visit: Payer: 59

## 2021-02-22 ENCOUNTER — Encounter (HOSPITAL_COMMUNITY): Payer: 59

## 2021-03-02 ENCOUNTER — Other Ambulatory Visit: Payer: Self-pay | Admitting: Family Medicine

## 2021-03-14 ENCOUNTER — Other Ambulatory Visit: Payer: Self-pay

## 2021-03-14 ENCOUNTER — Encounter: Payer: Self-pay | Admitting: Physician Assistant

## 2021-03-14 ENCOUNTER — Ambulatory Visit (INDEPENDENT_AMBULATORY_CARE_PROVIDER_SITE_OTHER): Payer: 59 | Admitting: Physician Assistant

## 2021-03-14 ENCOUNTER — Ambulatory Visit (HOSPITAL_COMMUNITY)
Admission: RE | Admit: 2021-03-14 | Discharge: 2021-03-14 | Disposition: A | Discharge status: Home / Self Care | Payer: 59 | Source: Ambulatory Visit | Attending: Physician Assistant | Admitting: Physician Assistant

## 2021-03-14 ENCOUNTER — Encounter: Payer: Self-pay | Admitting: Vascular Surgery

## 2021-03-14 VITALS — BP 149/89 | HR 100 | Temp 97.6°F | Resp 16 | Ht 69.0 in | Wt 144.1 lb

## 2021-03-14 DIAGNOSIS — I739 Peripheral vascular disease, unspecified: Secondary | ICD-10-CM

## 2021-03-14 NOTE — Progress Notes (Signed)
[facility] HISTORY AND PHYSICAL   History of Present Illness:  Patient is a 50 y.o. year old female who presents for evaluation of claudication.  She has increased claudication with stairs and states she can walk about 5 mins on level ground before resting.  She denise rest pain and non healing wounds.  She has known  B SFA occlusions.  She has never had vascular intervention.    Medical history includes: DM, hypertension and everyday smoking.    Past Medical History:  Diagnosis Date   Alcohol abuse    Cellulitis 11/2016   RIGHT ARM   Chronic pancreatitis ([place])    Diabetes mellitus    Esophageal candidiasis ([place]) 03/12/2019   Hypertension    Substance abuse ([place])     Past Surgical History:  Procedure Laterality Date   BREAST REDUCTION SURGERY      ROS:   General:  No weight loss, Fever, chills  HEENT: No recent headaches, no nasal bleeding, no visual changes, no sore throat  Neurologic: No dizziness, blackouts, seizures. No recent symptoms of stroke or mini- stroke. No recent episodes of slurred speech, or temporary blindness.  Cardiac: No recent episodes of chest pain/pressure, no shortness of breath at rest.  No shortness of breath with exertion.  Denies history of atrial fibrillation or irregular heartbeat  Vascular: No history of rest pain in feet.  No history of claudication.  No history of non-healing ulcer, No history of DVT   Pulmonary: No home oxygen, no productive cough, no hemoptysis,  No asthma or wheezing  Musculoskeletal:  [ ]  Arthritis, [ ]  Low back pain,  [ ]  Joint pain  Hematologic:No history of hypercoagulable state.  No history of easy bleeding.  No history of anemia  Gastrointestinal: No hematochezia or melena,  No gastroesophageal reflux, no trouble swallowing  Urinary: [ ]  chronic Kidney disease, [ ]  on HD - [ ]  MWF or [ ]  TTHS, [ ]  Burning with urination, [ ]  Frequent urination, [ ]  Difficulty urinating;   Skin: No  rashes  Psychological: No history of anxiety,  No history of depression  Social History Social History   Tobacco Use   Smoking status: Every Day    Packs/day: 1.00    Years: 22.00    Pack years: 22.00    Types: Cigarettes   Smokeless tobacco: Never   Tobacco comments:    working on quitting  Vaping Use   Vaping Use: Never used  Substance Use Topics   Alcohol use: Yes    Alcohol/week: 0.0 standard drinks    Comment: 11/2016 " i DRINK LESS THAN i USED TO,BUT i DRINK BEER EVERYDAY   Drug use: Not Currently    Comment: last use two weeks ago    Family History History reviewed. No pertinent family history.  Allergies  No Known Allergies   Current Outpatient Medications  Medication Sig Dispense Refill   acetaminophen (TYLENOL) 500 MG tablet Take 1,000 mg by mouth every 6 (six) hours as needed for mild pain.     ASPIRIN LOW DOSE 81 MG EC tablet TAKE 1 TABLET (81 MG TOTAL) BY MOUTH DAILY. SWALLOW WHOLE. 90 tablet 3   metFORMIN (GLUCOPHAGE) 1000 MG tablet TAKE 1 TABLET BY MOUTH EVERY DAY WITH BREAKFAST 90 tablet 1   Pancrelipase, Lip-Prot-Amyl, (ZENPEP) 40000-126000 units CPEP Take 1 tablet by mouth 3 (three) times daily before meals. 120 capsule 3   predniSONE (DELTASONE) 5 MG tablet TAKE 2 TABLETS EVERY OTHER  DAY, ALTERNATING WITH 2 AND A HALF EVERY OTHER DAY FOR 1 MONTH. 45 tablet 0   rosuvastatin (CRESTOR) 10 MG tablet TAKE 1 TABLET BY MOUTH EVERY DAY 90 tablet 3   gabapentin (NEURONTIN) 100 MG capsule Take 1 capsule (100 mg total) by mouth 3 (three) times daily. 90 capsule 0   gabapentin (NEURONTIN) 300 MG capsule Take 300 mg by mouth 3 (three) times daily. (Patient not taking: Reported on 03/14/2021)     No current facility-administered medications for this visit.    Physical Examination  Vitals:   03/14/21 0955  BP: (!) 149/89  Pulse: 100  Resp: 16  Temp: 97.6 F (36.4 C)  TempSrc: Temporal  SpO2: 100%  Weight: 144 lb 1.6 oz (65.4 kg)  Height: 5\' 9"  (1.753 m)     Body mass index is 21.28 kg/m.  General:  Alert and oriented, no acute distress HEENT: Normal Neck: No bruit or JVD Pulmonary: Clear to auscultation bilaterally Cardiac: Regular Rate and Rhythm without murmur Abdomen: Soft, non-tender, non-distended, no mass, no scars Skin: No rash         Thickened toe nail B second toes with onycomycosis  Extremity Pulses:  2+ radial, brachial, femoral, dorsalis pedis, posterior tibial pulses bilaterally Musculoskeletal: No deformity or edema  Neurologic: Upper and lower extremity motor 5/5 and symmetric  DATA:     ABI Findings:  +---------+------------------+-----+--------+--------+  Right    Rt Pressure (mmHg)IndexWaveformComment   +---------+------------------+-----+--------+--------+  Brachial 166                                      +---------+------------------+-----+--------+--------+  PTA      128               0.77 biphasic          +---------+------------------+-----+--------+--------+  DP       137               0.83 biphasic          +---------+------------------+-----+--------+--------+  Great Toe103               0.62                   +---------+------------------+-----+--------+--------+   +---------+------------------+-----+--------+-------+  Left     Lt Pressure (mmHg)IndexWaveformComment  +---------+------------------+-----+--------+-------+  Brachial 166                                     +---------+------------------+-----+--------+-------+  PTA      137               0.83 biphasic         +---------+------------------+-----+--------+-------+  DP       139               0.84 biphasic         +---------+------------------+-----+--------+-------+  Great Toe106               0.64                  +---------+------------------+-----+--------+-------+   +-------+-----------+-----------+------------+------------+  ABI/TBIToday's ABIToday's  TBIPrevious ABIPrevious TBI  +-------+-----------+-----------+------------+------------+  Right  0.83       0.62       0.88        0.66          +-------+-----------+-----------+------------+------------+  Left   0.84       0.64       0.96        0.69          +-------+-----------+-----------+------------+------------+        Summary:  Right: Resting right ankle-brachial index indicates mild right lower  extremity arterial disease. The right toe-brachial index is abnormal.   Left: Resting left ankle-brachial index indicates mild left lower  extremity arterial disease. The left toe-brachial index is abnormal.   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  She continues to avoid stairs.   Her ABI's are stable with biphasic flow and stable TBI's.    F/U in 6 months for repeat ABI's    She pointed out thickened second toe nails.  There is no skin break down or ischemic changes.  Cap refill is brisk.  I will refer her to Podiatry for care.        Roxy Horseman PA-C [facility] Office: [phone]

## 2021-03-16 ENCOUNTER — Other Ambulatory Visit: Payer: Self-pay

## 2021-03-16 DIAGNOSIS — I739 Peripheral vascular disease, unspecified: Secondary | ICD-10-CM

## 2021-03-19 ENCOUNTER — Other Ambulatory Visit: Payer: Self-pay | Admitting: Family Medicine

## 2021-03-21 ENCOUNTER — Encounter (HOSPITAL_COMMUNITY): Payer: Self-pay | Admitting: Emergency Medicine

## 2021-03-21 ENCOUNTER — Ambulatory Visit (HOSPITAL_COMMUNITY)
Admission: EM | Admit: 2021-03-21 | Discharge: 2021-03-21 | Disposition: A | Discharge status: Home / Self Care | Payer: 59 | Attending: Emergency Medicine | Admitting: Emergency Medicine

## 2021-03-21 ENCOUNTER — Other Ambulatory Visit: Payer: Self-pay

## 2021-03-21 DIAGNOSIS — S39012A Strain of muscle, fascia and tendon of lower back, initial encounter: Secondary | ICD-10-CM | POA: Diagnosis not present

## 2021-03-21 MED ORDER — KETOROLAC TROMETHAMINE 60 MG/2ML IM SOLN
INTRAMUSCULAR | Status: AC
  Filled 2021-03-21: qty 2

## 2021-03-21 MED ORDER — METHYLPREDNISOLONE 4 MG PO TBPK: ORAL_TABLET | ORAL | 0 refills | Status: DC

## 2021-03-21 MED ORDER — BACLOFEN 10 MG PO TABS: 10.0000 mg | ORAL_TABLET | Freq: Every day | ORAL | 0 refills | Status: AC

## 2021-03-21 MED ORDER — KETOROLAC TROMETHAMINE 60 MG/2ML IM SOLN
60.0000 mg | Freq: Once | INTRAMUSCULAR | Status: AC
  Administered 2021-03-21: 60 mg via INTRAMUSCULAR

## 2021-03-21 NOTE — Discharge Instructions (Addendum)
You received an injection of ketorolac in the office today to significantly reduce the inflammation in your lower back thereby reducing your pain.  Provided you with a muscle relaxer to take at bedtime for the next 7 nights.  These take the tablet few hours before you plan to go to bed.  Finally I provided you with a Medrol dose pack, please take 1 row of tablets daily until complete.  This will keep your inflammation well controlled while you are in this recovery phase.  If you wish to return to receive a second injection please feel free to do so, we are open on Friday Saturday and Sunday.  Thank you for visiting urgent care and I hope you have a happy Thanksgiving.
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
## 2021-03-30 ENCOUNTER — Ambulatory Visit (INDEPENDENT_AMBULATORY_CARE_PROVIDER_SITE_OTHER): Payer: 59 | Admitting: Podiatry

## 2021-03-30 ENCOUNTER — Encounter: Payer: Self-pay | Admitting: Podiatry

## 2021-03-30 DIAGNOSIS — B351 Tinea unguium: Secondary | ICD-10-CM | POA: Diagnosis not present

## 2021-03-30 DIAGNOSIS — E1140 Type 2 diabetes mellitus with diabetic neuropathy, unspecified: Secondary | ICD-10-CM | POA: Diagnosis not present

## 2021-03-30 DIAGNOSIS — M79675 Pain in left toe(s): Secondary | ICD-10-CM

## 2021-03-30 DIAGNOSIS — M79674 Pain in right toe(s): Secondary | ICD-10-CM

## 2021-03-30 DIAGNOSIS — Z794 Long term (current) use of insulin: Secondary | ICD-10-CM | POA: Diagnosis not present

## 2021-04-03 ENCOUNTER — Encounter: Payer: 59 | Admitting: Student

## 2021-04-05 NOTE — Progress Notes (Signed)
  Subjective:  Patient ID: Christine [name], female    DOB: 08/11/1970,  MRN: [id number]  Chief Complaint  Patient presents with   Nail Problem    Nail trim    50 y.o. female returns for the above complaint.  Patient presents with thickened elongated dystrophic toenails x10 patient states is painful to touch.  Patient would like to have them debrided down.  She is not able to do it herself.  Painful to walk on.  She denies any other acute complaints  Objective:  There were no vitals filed for this visit. Podiatric Exam: Vascular: dorsalis pedis and posterior tibial pulses are palpable bilateral. Capillary return is immediate. Temperature gradient is WNL. Skin turgor WNL  Sensorium: Normal Semmes Weinstein monofilament test. Normal tactile sensation bilaterally. Nail Exam: Pt has thick disfigured discolored nails with subungual debris noted bilateral entire nail hallux through fifth toenails.  Pain on palpation to the nails. Ulcer Exam: There is no evidence of ulcer or pre-ulcerative changes or infection. Orthopedic Exam: Muscle tone and strength are WNL. No limitations in general ROM. No crepitus or effusions noted.  Skin: No Porokeratosis. No infection or ulcers    Assessment & Plan:   1. Pain due to onychomycosis of toenails of both feet   2. Type 2 diabetes mellitus with diabetic neuropathy, with long-term current use of insulin ([place])     Patient was evaluated and treated and all questions answered.  Onychomycosis with pain  -Nails palliatively debrided as below. -Educated on self-care  Procedure: Nail Debridement Rationale: pain  Type of Debridement: manual, sharp debridement. Instrumentation: Nail nipper, rotary burr. Number of Nails: 10  Procedures and Treatment: Consent by patient was obtained for treatment procedures. The patient understood the discussion of treatment and procedures well. All questions were answered thoroughly reviewed. Debridement of mycotic and  hypertrophic toenails, 1 through 5 bilateral and clearing of subungual debris. No ulceration, no infection noted.  Return Visit-Office Procedure: Patient instructed to return to the office for a follow up visit 3 months for continued evaluation and treatment.  Boneta Lucks, DPM    Return in about 3 months (around 06/28/2021).

## 2021-04-17 ENCOUNTER — Other Ambulatory Visit: Payer: Self-pay | Admitting: Family Medicine

## 2021-05-16 ENCOUNTER — Other Ambulatory Visit: Payer: Self-pay | Admitting: Family Medicine

## 2021-06-25 ENCOUNTER — Other Ambulatory Visit: Payer: Self-pay

## 2021-06-25 ENCOUNTER — Ambulatory Visit (HOSPITAL_COMMUNITY)
Admission: EM | Admit: 2021-06-25 | Discharge: 2021-06-25 | Disposition: A | Discharge status: Home / Self Care | Payer: Medicaid Other | Attending: Internal Medicine | Admitting: Internal Medicine

## 2021-06-25 ENCOUNTER — Other Ambulatory Visit: Payer: Self-pay | Admitting: Family Medicine

## 2021-06-25 ENCOUNTER — Other Ambulatory Visit: Payer: Self-pay | Admitting: Vascular Surgery

## 2021-06-25 ENCOUNTER — Encounter (HOSPITAL_COMMUNITY): Payer: Self-pay | Admitting: Emergency Medicine

## 2021-06-25 DIAGNOSIS — K047 Periapical abscess without sinus: Secondary | ICD-10-CM

## 2021-06-25 MED ORDER — TRAMADOL HCL 50 MG PO TABS: 50.0000 mg | ORAL_TABLET | Freq: Four times a day (QID) | ORAL | 0 refills | Status: DC | PRN

## 2021-06-25 MED ORDER — AMOXICILLIN-POT CLAVULANATE 875-125 MG PO TABS: 1.0000 | ORAL_TABLET | Freq: Two times a day (BID) | ORAL | 0 refills | Status: DC

## 2021-06-25 MED ORDER — LIDOCAINE VISCOUS HCL 2 % MT SOLN: 15.0000 mL | OROMUCOSAL | 0 refills | Status: DC | PRN

## 2021-06-25 NOTE — ED Triage Notes (Signed)
Patient has right ear pain, headache, and tooth on bottom, right is painful.  Onset of symptoms was Saturday night
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
## 2021-06-25 NOTE — Discharge Instructions (Addendum)
Please take medications as prescribed You will need to see a dentist for dental extraction Warm salt water gargle Use lidocaine gel as needed Return to urgent care if symptoms worsen.

## 2021-07-06 ENCOUNTER — Ambulatory Visit (INDEPENDENT_AMBULATORY_CARE_PROVIDER_SITE_OTHER): Payer: Self-pay | Admitting: Podiatry

## 2021-07-06 DIAGNOSIS — Z91199 Patient's noncompliance with other medical treatment and regimen due to unspecified reason: Secondary | ICD-10-CM

## 2021-07-07 NOTE — Progress Notes (Signed)
   Complete physical exam  Patient: Christine [name]   DOB: 02/16/1999   51 y.o. Female  MRN: [id number]  Subjective:    No chief complaint on file.   Christine [name] is a 51 y.o. female who presents today for a complete physical exam. She reports consuming a {diet types:17450} diet. {types:19826} She generally feels {DESC; WELL/FAIRLY WELL/POORLY:18703}. She reports sleeping {DESC; WELL/FAIRLY WELL/POORLY:18703}. She {does/does not:200015} have additional problems to discuss today.    Most recent fall risk assessment:    10/24/2021   10:42 AM  Fall Risk   Falls in the past year? 0  Number falls in past yr: 0  Injury with Fall? 0  Risk for fall due to : No Fall Risks  Follow up Falls evaluation completed     Most recent depression screenings:    10/24/2021   10:42 AM 09/14/2020   10:46 AM  PHQ 2/9 Scores  PHQ - 2 Score 0 0  PHQ- 9 Score 5     {VISON DENTAL STD PSA (Optional):27386}  {History (Optional):23778}  Patient Care Team: Jessup, Joy, NP as PCP - General (Nurse Practitioner)   Outpatient Medications Prior to Visit  Medication Sig   fluticasone (FLONASE) 50 MCG/ACT nasal spray Place 2 sprays into both nostrils in the morning and at bedtime. After 7 days, reduce to once daily.   norgestimate-ethinyl estradiol (SPRINTEC 28) 0.25-35 MG-MCG tablet Take 1 tablet by mouth daily.   Nystatin POWD Apply liberally to affected area 2 times per day   spironolactone (ALDACTONE) 100 MG tablet Take 1 tablet (100 mg total) by mouth daily.   No facility-administered medications prior to visit.    ROS        Objective:     There were no vitals taken for this visit. {Vitals History (Optional):23777}  Physical Exam   No results found for any visits on 11/29/21. {Show previous labs (optional):23779}    Assessment & Plan:    Routine Health Maintenance and Physical Exam  Immunization History  Administered Date(s) Administered   DTaP 05/02/1999, 06/28/1999,  09/06/1999, 05/22/2000, 12/06/2003   Hepatitis A 10/02/2007, 10/07/2008   Hepatitis B 02/17/1999, 03/27/1999, 09/06/1999   HiB (PRP-OMP) 05/02/1999, 06/28/1999, 09/06/1999, 05/22/2000   IPV 05/02/1999, 06/28/1999, 02/25/2000, 12/06/2003   Influenza,inj,Quad PF,6+ Mos 01/07/2014   Influenza-Unspecified 04/08/2012   MMR 02/24/2001, 12/06/2003   Meningococcal Polysaccharide 10/07/2011   Pneumococcal Conjugate-13 05/22/2000   Pneumococcal-Unspecified 09/06/1999, 11/20/1999   Tdap 10/07/2011   Varicella 02/25/2000, 10/02/2007    Health Maintenance  Topic Date Due   HIV Screening  Never done   Hepatitis C Screening  Never done   INFLUENZA VACCINE  11/27/2021   PAP-Cervical Cytology Screening  11/29/2021 (Originally 02/16/2020)   PAP SMEAR-Modifier  11/29/2021 (Originally 02/16/2020)   TETANUS/TDAP  11/29/2021 (Originally 10/06/2021)   HPV VACCINES  Discontinued   COVID-19 Vaccine  Discontinued    Discussed health benefits of physical activity, and encouraged her to engage in regular exercise appropriate for her age and condition.  Problem List Items Addressed This Visit   None Visit Diagnoses     Annual physical exam    -  Primary   Cervical cancer screening       Need for Tdap vaccination          No follow-ups on file.     Joy Jessup, NP   

## 2021-07-30 ENCOUNTER — Ambulatory Visit (INDEPENDENT_AMBULATORY_CARE_PROVIDER_SITE_OTHER): Payer: Self-pay | Admitting: Family Medicine

## 2021-07-30 ENCOUNTER — Encounter: Payer: Self-pay | Admitting: Family Medicine

## 2021-07-30 ENCOUNTER — Other Ambulatory Visit: Payer: Self-pay | Admitting: Family Medicine

## 2021-07-30 VITALS — BP 146/82 | HR 81 | Ht 69.0 in | Wt 137.8 lb

## 2021-07-30 DIAGNOSIS — E43 Unspecified severe protein-calorie malnutrition: Secondary | ICD-10-CM

## 2021-07-30 DIAGNOSIS — I152 Hypertension secondary to endocrine disorders: Secondary | ICD-10-CM

## 2021-07-30 DIAGNOSIS — F17200 Nicotine dependence, unspecified, uncomplicated: Secondary | ICD-10-CM

## 2021-07-30 DIAGNOSIS — E1159 Type 2 diabetes mellitus with other circulatory complications: Secondary | ICD-10-CM

## 2021-07-30 DIAGNOSIS — E119 Type 2 diabetes mellitus without complications: Secondary | ICD-10-CM

## 2021-07-30 DIAGNOSIS — I10 Essential (primary) hypertension: Secondary | ICD-10-CM

## 2021-07-30 DIAGNOSIS — M545 Low back pain, unspecified: Secondary | ICD-10-CM

## 2021-07-30 DIAGNOSIS — Z720 Tobacco use: Secondary | ICD-10-CM

## 2021-07-30 LAB — POCT GLYCOSYLATED HEMOGLOBIN (HGB A1C): HbA1c, POC (controlled diabetic range): 8.6 % — AB (ref 0.0–7.0)

## 2021-07-30 MED ORDER — EMPAGLIFLOZIN 10 MG PO TABS: 10.0000 mg | ORAL_TABLET | Freq: Every day | ORAL | 3 refills | Status: DC

## 2021-07-30 MED ORDER — CHANTIX STARTING MONTH PAK 0.5 MG X 11 & 1 MG X 42 PO TBPK: 1.0000 | ORAL_TABLET | Freq: Every day | ORAL | 0 refills | Status: DC

## 2021-07-30 MED ORDER — VARENICLINE TARTRATE 0.5 MG PO TABS: 0.5000 mg | ORAL_TABLET | Freq: Two times a day (BID) | ORAL | 3 refills | Status: DC

## 2021-07-30 MED ORDER — METFORMIN HCL ER 500 MG PO TB24: 1000.0000 mg | ORAL_TABLET | Freq: Two times a day (BID) | ORAL | 3 refills | Status: DC

## 2021-07-30 NOTE — Patient Instructions (Signed)
It was wonderful seeing you today.  Regarding your diabetes your hemoglobin A1c is up to 6.8.  I want to start you on a medication called Jardiance which should help with his blood sugars.  I would also like for you to work on decreasing the amount of soda you are drinking or switching to diet.  Regarding your blood pressure you may see some improvement with the blood pressures and I would like to see you in a month to recheck your blood pressure before starting you on a medication.  Regarding your smoking cessation I have sent a prescription for a medication called Chantix.  You will take this once daily for 1 week and then take it twice daily.  Be sure to take it with meals.  For your low back pain I want her to take Tylenol arthritic pain and please follow the instructions on the bottle.  [facility].  If you have any questions or concerns please call the clinic.  Hope you have a wonderful day! ? ? ?

## 2021-07-30 NOTE — Progress Notes (Signed)
? ? ?  SUBJECTIVE:  ? ?CHIEF COMPLAINT / HPI:  ? ?Diabetes Check up  ?Patient denies any low blood sugars although she reports she does not check them regularly.  Previous hemoglobin A1c was 6.5.  Patient has been on metformin but at a lower dose.  Blood sugars have been elevated at previous checks but no A1c since 2021.  Denies any signs or symptoms of hyperglycemia. ? ?HTN ?Patient's blood pressure has been elevated in the past but patient is currently not taking any medications for blood pressure.  Denies any signs or symptoms of hypertension such as chest pain, shortness of breath, recurrent headaches. ? ?Smoking cessation ?Patient is currently smoking approximately half pack a day but is interested in quitting.  Has used nicotine patches in the past but would like to try something else. ? ?Low back pain ?Patient has chronic low back pain which occurs right in the middle.  Denies any known injury to the region.  Denies any point tenderness. ? ?OBJECTIVE:  ? ?BP (!) 146/82   Pulse 81   Ht '5\' 9"'$  (1.753 m)   Wt 137 lb 12.8 oz (62.5 kg)   SpO2 100%   BMI 20.35 kg/m?   ?General: Pleasant 51 year old female in no acute distress ?Cardiac: Regular rate and rhythm, no murmurs appreciated ?Respiratory: Breathing, lungs clear to auscultation bilaterally ?Abdomen: Soft, nontender, positive bowel sounds ?MSK: Patient with paraspinal tenderness in the lumbar region, pain is muscular, no decreased range of motion ? ?ASSESSMENT/PLAN:  ? ?Type 2 diabetes mellitus without complication, without long-term current use of insulin ([place]) ?Patient's hemoglobin A1c today is 8.6 from 6.5 at previous check.  Currently on metformin only.  Increase metformin dose to 1000 mg twice daily.  Also started patient on Jardiance 10 mg daily.  Plan on follow-up in 3 months for repeat A1c.  Collecting BMP today. ? ?Tobacco use disorder ?Patient currently smoking half pack a day.  Was on Franklyn in the past but discontinued after recall.  Starting  patient back on Chantix to help with smoking cessation. ? ?Protein-calorie malnutrition, severe ?Patient continues to take pancreatic supplements at this time and is doing well on them. ? ?Hypertension associated with diabetes ([place]) ?Blood pressure mildly elevated today at 146/82.  I am starting patient on Jardiance which should increase urinary output.  We will follow-up in 3 months and consider addition of antihypertensive medication at this time.  Checking BMP today. ? ?Low back pain ?Patient complaining of low back pain which seems musculoskeletal in origin.  No midline tenderness or bony tenderness.  Recommended Tylenol arthritic pain and provided return precautions.  No further questions or concerns. ?  ? ? ?Gifford Shave, MD ?[place]  ? ?

## 2021-07-31 DIAGNOSIS — M545 Low back pain, unspecified: Secondary | ICD-10-CM | POA: Insufficient documentation

## 2021-07-31 LAB — BASIC METABOLIC PANEL
BUN/Creatinine Ratio: 13 (ref 9–23)
BUN: 17 mg/dL (ref 6–24)
CO2: 20 mmol/L (ref 20–29)
Calcium: 8.9 mg/dL (ref 8.7–10.2)
Chloride: 108 mmol/L — ABNORMAL HIGH (ref 96–106)
Creatinine, Ser: 1.27 mg/dL — ABNORMAL HIGH (ref 0.57–1.00)
Glucose: 103 mg/dL — ABNORMAL HIGH (ref 70–99)
Potassium: 4.7 mmol/L (ref 3.5–5.2)
Sodium: 141 mmol/L (ref 134–144)
eGFR: 52 mL/min/{1.73_m2} — ABNORMAL LOW (ref >59)

## 2021-07-31 NOTE — Assessment & Plan Note (Signed)
Blood pressure mildly elevated today at 146/82.  I am starting patient on Jardiance which should increase urinary output.  We will follow-up in 3 months and consider addition of antihypertensive medication at this time.  Checking BMP today. ?

## 2021-07-31 NOTE — Assessment & Plan Note (Addendum)
Patient complaining of low back pain which seems musculoskeletal in origin.  No midline tenderness or bony tenderness.  Recommended Tylenol arthritic pain and provided return precautions.  No further questions or concerns. ?

## 2021-07-31 NOTE — Assessment & Plan Note (Signed)
Patient currently smoking half pack a day.  Was on Franklyn in the past but discontinued after recall.  Starting patient back on Chantix to help with smoking cessation. ?

## 2021-07-31 NOTE — Assessment & Plan Note (Signed)
Patient continues to take pancreatic supplements at this time and is doing well on them. ?

## 2021-07-31 NOTE — Assessment & Plan Note (Signed)
Patient's hemoglobin A1c today is 8.6 from 6.5 at previous check.  Currently on metformin only.  Increase metformin dose to 1000 mg twice daily.  Also started patient on Jardiance 10 mg daily.  Plan on follow-up in 3 months for repeat A1c.  Collecting BMP today. ?

## 2021-08-02 ENCOUNTER — Other Ambulatory Visit: Payer: Self-pay

## 2021-08-02 NOTE — Telephone Encounter (Signed)
Received [facility] refill request for pt. Spoke to pt. She does not want to change pharmacies to [place] at this time. ?Ottis Stain, CMA ? ?

## 2021-08-17 ENCOUNTER — Other Ambulatory Visit: Payer: Self-pay | Admitting: *Deleted

## 2021-08-17 NOTE — Telephone Encounter (Signed)
Contacted pt to ask if she was using [facility] for her pharmacy and she said that she was not. [facility]  While on phone she said she needed a refill on her prednisone.  Told pt I would send to doctor to fill if appropriate.Alaynah Schutter Zimmerman Rumple, CMA ? ?

## 2021-08-21 MED ORDER — PREDNISONE 5 MG PO TABS
ORAL_TABLET | ORAL | 0 refills | Status: DC
Start: 2021-08-21 — End: 2022-03-13

## 2021-08-31 ENCOUNTER — Ambulatory Visit: Payer: Medicaid Other | Admitting: Family Medicine

## 2021-10-02 ENCOUNTER — Emergency Department (HOSPITAL_COMMUNITY)
Admission: EM | Admit: 2021-10-02 | Discharge: 2021-10-02 | Disposition: A | Discharge status: Home / Self Care | Payer: Commercial Managed Care - HMO | Attending: Emergency Medicine | Admitting: Emergency Medicine

## 2021-10-02 ENCOUNTER — Other Ambulatory Visit: Payer: Self-pay

## 2021-10-02 ENCOUNTER — Emergency Department (HOSPITAL_COMMUNITY): Payer: Commercial Managed Care - HMO

## 2021-10-02 ENCOUNTER — Encounter (HOSPITAL_COMMUNITY): Payer: Self-pay

## 2021-10-02 ENCOUNTER — Encounter: Payer: Self-pay | Admitting: *Deleted

## 2021-10-02 DIAGNOSIS — Z7982 Long term (current) use of aspirin: Secondary | ICD-10-CM | POA: Diagnosis not present

## 2021-10-02 DIAGNOSIS — Z79899 Other long term (current) drug therapy: Secondary | ICD-10-CM | POA: Diagnosis not present

## 2021-10-02 DIAGNOSIS — Z7984 Long term (current) use of oral hypoglycemic drugs: Secondary | ICD-10-CM | POA: Diagnosis not present

## 2021-10-02 DIAGNOSIS — Z20822 Contact with and (suspected) exposure to covid-19: Secondary | ICD-10-CM | POA: Insufficient documentation

## 2021-10-02 DIAGNOSIS — R059 Cough, unspecified: Secondary | ICD-10-CM | POA: Diagnosis present

## 2021-10-02 DIAGNOSIS — I10 Essential (primary) hypertension: Secondary | ICD-10-CM | POA: Diagnosis not present

## 2021-10-02 DIAGNOSIS — J189 Pneumonia, unspecified organism: Secondary | ICD-10-CM | POA: Insufficient documentation

## 2021-10-02 DIAGNOSIS — E119 Type 2 diabetes mellitus without complications: Secondary | ICD-10-CM | POA: Diagnosis not present

## 2021-10-02 LAB — URINALYSIS, ROUTINE W REFLEX MICROSCOPIC
Bacteria, UA: NONE SEEN
Bilirubin Urine: NEGATIVE
Glucose, UA: NEGATIVE mg/dL
Hgb urine dipstick: NEGATIVE
Ketones, ur: NEGATIVE mg/dL
Leukocytes,Ua: NEGATIVE
Nitrite: NEGATIVE
Protein, ur: 30 mg/dL — AB
Specific Gravity, Urine: 1.006 (ref 1.005–1.030)
pH: 5 (ref 5.0–8.0)

## 2021-10-02 LAB — COMPREHENSIVE METABOLIC PANEL
ALT: 13 U/L (ref 0–44)
AST: 17 U/L (ref 15–41)
Albumin: 3.4 g/dL — ABNORMAL LOW (ref 3.5–5.0)
Alkaline Phosphatase: 121 U/L (ref 38–126)
Anion gap: 13 (ref 5–15)
BUN: 9 mg/dL (ref 6–20)
CO2: 21 mmol/L — ABNORMAL LOW (ref 22–32)
Calcium: 8.9 mg/dL (ref 8.9–10.3)
Chloride: 93 mmol/L — ABNORMAL LOW (ref 98–111)
Creatinine, Ser: 1.09 mg/dL — ABNORMAL HIGH (ref 0.44–1.00)
GFR, Estimated: >60 mL/min (ref >60)
Glucose, Bld: 142 mg/dL — ABNORMAL HIGH (ref 70–99)
Potassium: 3.9 mmol/L (ref 3.5–5.1)
Sodium: 127 mmol/L — ABNORMAL LOW (ref 135–145)
Total Bilirubin: 0.4 mg/dL (ref 0.3–1.2)
Total Protein: 6.9 g/dL (ref 6.5–8.1)

## 2021-10-02 LAB — CBC WITH DIFFERENTIAL/PLATELET
Abs Immature Granulocytes: 0.04 10*3/uL (ref 0.00–0.07)
Basophils Absolute: 0 10*3/uL (ref 0.0–0.1)
Basophils Relative: 0 %
Eosinophils Absolute: 0 10*3/uL (ref 0.0–0.5)
Eosinophils Relative: 0 %
HCT: 30.3 % — ABNORMAL LOW (ref 36.0–46.0)
Hemoglobin: 10.4 g/dL — ABNORMAL LOW (ref 12.0–15.0)
Immature Granulocytes: 0 %
Lymphocytes Relative: 9 %
Lymphs Abs: 0.9 10*3/uL (ref 0.7–4.0)
MCH: 31.8 pg (ref 26.0–34.0)
MCHC: 34.3 g/dL (ref 30.0–36.0)
MCV: 92.7 fL (ref 80.0–100.0)
Monocytes Absolute: 0.8 10*3/uL (ref 0.1–1.0)
Monocytes Relative: 9 %
Neutro Abs: 8 10*3/uL — ABNORMAL HIGH (ref 1.7–7.7)
Neutrophils Relative %: 82 %
Platelets: 137 10*3/uL — ABNORMAL LOW (ref 150–400)
RBC: 3.27 MIL/uL — ABNORMAL LOW (ref 3.87–5.11)
RDW: 13.7 % (ref 11.5–15.5)
WBC: 9.8 10*3/uL (ref 4.0–10.5)
nRBC: 0 % (ref 0.0–0.2)

## 2021-10-02 LAB — PREGNANCY, URINE: Preg Test, Ur: NEGATIVE

## 2021-10-02 LAB — TROPONIN I (HIGH SENSITIVITY)
Troponin I (High Sensitivity): 10 ng/L (ref <18)
Troponin I (High Sensitivity): 7 ng/L (ref <18)

## 2021-10-02 LAB — I-STAT BETA HCG BLOOD, ED (MC, WL, AP ONLY): I-stat hCG, quantitative: 15.7 m[IU]/mL — ABNORMAL HIGH (ref <5)

## 2021-10-02 LAB — SARS CORONAVIRUS 2 BY RT PCR: SARS Coronavirus 2 by RT PCR: NEGATIVE

## 2021-10-02 LAB — LIPASE, BLOOD: Lipase: 18 U/L (ref 11–51)

## 2021-10-02 MED ORDER — IBUPROFEN 800 MG PO TABS
800.0000 mg | ORAL_TABLET | Freq: Once | ORAL | Status: AC
  Administered 2021-10-02: 800 mg via ORAL
  Filled 2021-10-02: qty 1

## 2021-10-02 MED ORDER — DOXYCYCLINE HYCLATE 100 MG PO CAPS: 100.0000 mg | ORAL_CAPSULE | Freq: Two times a day (BID) | ORAL | 0 refills | Status: DC

## 2021-10-02 MED ORDER — SODIUM CHLORIDE 0.9 % IV BOLUS
1000.0000 mL | Freq: Once | INTRAVENOUS | Status: AC
  Administered 2021-10-02: 1000 mL via INTRAVENOUS

## 2021-10-02 MED ORDER — DM-GUAIFENESIN ER 30-600 MG PO TB12: 1.0000 | ORAL_TABLET | Freq: Two times a day (BID) | ORAL | 0 refills | Status: DC

## 2021-10-02 NOTE — ED Provider Triage Note (Signed)
Emergency Medicine Provider Triage Evaluation Note  Christine [name] , a 51 y.o. female  was evaluated in triage.  Pt complains of weakness for the last 4 days. Feeling lightheaded, near syncope, having difficulty ambulating to the restroom on her own this evening which resulted in a mechanical fall that required her boyfriend to assist her from the ground.  Been coughing endorses productive cough, decreased appetite x4 days.  Review of Systems  Positive: Chest pain, shortness of breath, lightheadedness, near-syncope, cough Negative: Nausea, vomiting or diarrhea  Physical Exam  There were no vitals taken for this visit. Gen:   Awake, no distress   Resp:  Normal effort  MSK:   Moves extremities without difficulty  Other:  RRR no M/R/G.  Lungs CTA B but patient with wet sounding cough throughout exam.  Appears fatigued.  No lower extremity edema.  Medical Decision Making  Medically screening exam initiated at 4:21 AM.  Appropriate orders placed.  Janal A Goodreau was informed that the remainder of the evaluation will be completed by another provider, this initial triage assessment does not replace that evaluation, and the importance of remaining in the ED until their evaluation is complete.  This chart was dictated using voice recognition software, Dragon. Despite the best efforts of this provider to proofread and correct errors, errors may still occur which can change documentation meaning.    Emeline Darling, PA-C 10/02/21 [phone]

## 2021-10-02 NOTE — Discharge Instructions (Signed)
You have been seen and discharged from the emergency department.  Your work-up revealed a respiratory infection, take antibiotic and medications as directed.  Stay well-hydrated.  Follow-up with your primary provider for further evaluation and further care. Take home medications as prescribed. If you have any worsening symptoms, severe chest pain, difficulty breathing or further concerns for your health please return to an emergency department for further evaluation.

## 2021-10-02 NOTE — ED Notes (Signed)
Pt reports that she had 2 syncopal episodes at home with diarrhea. No N/V. Pt ambulated to restroom with a steady gait but forget to obtain urine sample.

## 2021-10-02 NOTE — ED Notes (Signed)
Pt given bag lunch and drink. Reports she feels a little better after the fluid. Doesn't feel as dizzy.

## 2021-10-02 NOTE — ED Notes (Signed)
Gave patient some water patient stated that she will try to get a urine sample in a little

## 2021-10-02 NOTE — ED Triage Notes (Signed)
Pt comes via [facility] for near syncopal episode, has been having weakness all day. + orthostatics with EMS

## 2021-10-02 NOTE — ED Provider Notes (Signed)
[facility] EMERGENCY DEPARTMENT Provider Note   CSN: [id number] Arrival date & time: 10/02/21  0419     History  Chief Complaint  Patient presents with   Near Syncope    Christine [name] is a 51 y.o. female.  HPI  50 year old female with past medical history of HTN, DM presents the emergency department with cough, chills/weakness and a near syncopal episode.  Patient reports feeling near syncopal just prior to arrival, called EMS.  No loss of consciousness.  No chest pain or palpitations.  Was noted to be orthostatic with EMS, received fluid prior to arrival.  Patient endorses an intermittently productive cough for the past couple days and chills.  No swelling of her lower extremities.  No GI symptoms.  No recent sick contacts.  Home Medications Prior to Admission medications   Medication Sig Start Date End Date Taking? Authorizing Provider  ASPIRIN LOW DOSE 81 MG EC tablet TAKE 1 TABLET (81 MG TOTAL) BY MOUTH DAILY. SWALLOW WHOLE. Patient taking differently: Take 81 mg by mouth daily. 12/26/20  Yes Waynetta Sandy, MD  Chlorphen-Phenyleph-ASA (ALKA-SELTZER PLUS COLD PO) Take 2 packets by mouth in the morning and at bedtime.   Yes [provider]  dextromethorphan-guaiFENesin (MUCINEX DM) 30-600 MG 12hr tablet Take 1 tablet by mouth 2 (two) times daily. 10/02/21  Yes Janelle Spellman, Alvin Critchley, DO  doxycycline (VIBRAMYCIN) 100 MG capsule Take 1 capsule (100 mg total) by mouth 2 (two) times daily. 10/02/21  Yes Alpheus Stiff M, DO  gabapentin (NEURONTIN) 300 MG capsule TAKE 1 CAPSULE BY MOUTH THREE TIMES A DAY Patient taking differently: Take 300 mg by mouth daily. 03/20/21  Yes Gifford Shave, MD  ibuprofen (ADVIL) 200 MG tablet Take 400 mg by mouth in the morning and at bedtime.   Yes [provider]  metFORMIN (GLUCOPHAGE-XR) 500 MG 24 hr tablet Take 2 tablets (1,000 mg total) by mouth in the morning and at bedtime. Patient taking differently: Take  500 mg by mouth in the morning, at noon, and at bedtime. 07/30/21  Yes Gifford Shave, MD  OVER THE COUNTER MEDICATION Take 2 tablets by mouth daily as needed (cold symptoms). Pseudoephedrine po   Yes [provider]  predniSONE (DELTASONE) 5 MG tablet Take 1 tablet every other day alternating with 2 tablets every other day for 1 month Patient taking differently: Take 10 mg by mouth daily. Take 1 tablet every other day alternating with 2 tablets every other day for 1 month 08/21/21  Yes Gifford Shave, MD  rosuvastatin (CRESTOR) 10 MG tablet TAKE 1 TABLET BY MOUTH EVERY DAY Patient taking differently: Take 10 mg by mouth daily. 06/25/21  Yes Waynetta Sandy, MD  acetaminophen (TYLENOL) 500 MG tablet Take 1,000 mg by mouth every 6 (six) hours as needed for mild pain. Patient not taking: Reported on 10/02/2021    [provider]  empagliflozin (JARDIANCE) 10 MG TABS tablet Take 1 tablet (10 mg total) by mouth daily. Patient not taking: Reported on 10/02/2021 07/30/21   Gifford Shave, MD  lidocaine (XYLOCAINE) 2 % solution Use as directed 15 mLs in the mouth or throat as needed for mouth pain. Patient not taking: Reported on 10/02/2021 06/25/21   Chase Picket, MD  Pancrelipase, Lip-Prot-Amyl, (ZENPEP) 40000-126000 units CPEP Take 1 tablet by mouth 3 (three) times daily before meals. Patient not taking: Reported on 10/02/2021 11/03/20   Gifford Shave, MD  traMADol (ULTRAM) 50 MG tablet Take 1 tablet (50 mg total)  by mouth every 6 (six) hours as needed. Patient not taking: Reported on 10/02/2021 06/25/21   Chase Picket, MD  varenicline (CHANTIX) 0.5 MG tablet Take 1 tablet (0.5 mg total) by mouth 2 (two) times daily. Patient not taking: Reported on 10/02/2021 07/30/21   Gifford Shave, MD      Allergies    Patient has no known allergies.    Review of Systems   Review of Systems  Constitutional:  Positive for chills and fatigue. Negative for fever.  Respiratory:   Positive for cough. Negative for shortness of breath.   Cardiovascular:  Negative for chest pain, palpitations and leg swelling.  Gastrointestinal:  Negative for abdominal pain, diarrhea and vomiting.  Skin:  Negative for rash.  Neurological:  Negative for syncope and headaches.   Physical Exam Updated Vital Signs BP (!) 169/88   Pulse 95   Temp 98.5 F (36.9 C) (Oral)   Resp 18   Ht '5\' 9"'$  (1.753 m)   Wt 63.5 kg   SpO2 94%   BMI 20.67 kg/m  Physical Exam Vitals and nursing note reviewed.  Constitutional:      General: She is not in acute distress.    Appearance: Normal appearance. She is not diaphoretic.  HENT:     Head: Normocephalic.     Mouth/Throat:     Mouth: Mucous membranes are moist.  Cardiovascular:     Rate and Rhythm: Normal rate.  Pulmonary:     Effort: Pulmonary effort is normal. No respiratory distress.  Abdominal:     Palpations: Abdomen is soft.     Tenderness: There is no abdominal tenderness.  Musculoskeletal:        General: No swelling.  Skin:    General: Skin is warm.  Neurological:     Mental Status: She is alert and oriented to person, place, and time. Mental status is at baseline.  Psychiatric:        Mood and Affect: Mood normal.    ED Results / Procedures / Treatments   Labs (all labs ordered are listed, but only abnormal results are displayed) Labs Reviewed  COMPREHENSIVE METABOLIC PANEL - Abnormal; Notable for the following components:      Result Value   Sodium 127 (*)    Chloride 93 (*)    CO2 21 (*)    Glucose, Bld 142 (*)    Creatinine, Ser 1.09 (*)    Albumin 3.4 (*)    All other components within normal limits  CBC WITH DIFFERENTIAL/PLATELET - Abnormal; Notable for the following components:   RBC 3.27 (*)    Hemoglobin 10.4 (*)    HCT 30.3 (*)    Platelets 137 (*)    Neutro Abs 8.0 (*)    All other components within normal limits  URINALYSIS, ROUTINE W REFLEX MICROSCOPIC - Abnormal; Notable for the following components:    Protein, ur 30 (*)    All other components within normal limits  I-STAT BETA HCG BLOOD, ED ([facility], [facility], [facility] ONLY) - Abnormal; Notable for the following components:   I-stat hCG, quantitative 15.7 (*)    All other components within normal limits  SARS CORONAVIRUS 2 BY RT PCR  LIPASE, BLOOD  PREGNANCY, URINE  TROPONIN I (HIGH SENSITIVITY)  TROPONIN I (HIGH SENSITIVITY)    EKG None  Radiology DG Chest 2 View  Result Date: 10/02/2021 CLINICAL DATA:  Weakness, chest pain, syncope today. EXAM: CHEST - 2 VIEW COMPARISON:  12/05/2019. FINDINGS: The heart size and mediastinal  contours are within normal limits. Patchy airspace disease is noted at the lung bases bilaterally. No effusion or pneumothorax. No acute osseous abnormality. IMPRESSION: Patchy airspace disease at the lung bases, possible atelectasis or infiltrate. Electronically Signed   By: Brett Fairy M.D.   On: 10/02/2021 04:59    Procedures Procedures    Medications Ordered in ED Medications  ibuprofen (ADVIL) tablet 800 mg (800 mg Oral Given 10/02/21 0938)  sodium chloride 0.9 % bolus 1,000 mL (0 mLs Intravenous Stopped 10/02/21 1413)    ED Course/ Medical Decision Making/ A&P                           Medical Decision Making Amount and/or Complexity of Data Reviewed Labs: ordered.  Risk OTC drugs. Prescription drug management.   51 year old female presents emergency department with a near syncopal episode, reported to be orthostatic with EMS, received fluid prior to arrival.  Blood pressure stable on arrival, low-grade fever.  Complaining of respiratory symptoms with decreased p.o. intake over the past couple days.  Work-up from a cardiac standpoint is reassuring.  No concerning EKG changes.  No significant leukocytosis, troponin is negative, no findings of CHF.  Patient noted to have mild hyponatremia, most likely dehydration.  We continued IV hydration here.  Chest x-ray shows infiltrates in the lower lobes, consistent  with respiratory infection/pneumonia given her current presentation.  COVID is negative.  Patient is on room air, no respiratory distress, no hypoxia.  Stable to ambulate without difficulty.  Plan for discharge on oral antibiotics and outpatient follow-up for pneumonia.  Patient at this time appears safe and stable for discharge and close outpatient follow up. Discharge plan and strict return to ED precautions discussed, patient verbalizes understanding and agreement.        Final Clinical Impression(s) / ED Diagnoses Final diagnoses:  Community acquired pneumonia, unspecified laterality    Rx / DC Orders ED Discharge Orders          Ordered    doxycycline (VIBRAMYCIN) 100 MG capsule  2 times daily        10/02/21 1624    dextromethorphan-guaiFENesin (MUCINEX DM) 30-600 MG 12hr tablet  2 times daily        10/02/21 1624              Albie Bazin, Alvin Critchley, DO 10/02/21 1632

## 2021-10-08 ENCOUNTER — Ambulatory Visit (INDEPENDENT_AMBULATORY_CARE_PROVIDER_SITE_OTHER): Payer: Commercial Managed Care - HMO

## 2021-10-08 DIAGNOSIS — Z111 Encounter for screening for respiratory tuberculosis: Secondary | ICD-10-CM | POA: Diagnosis not present

## 2021-10-08 NOTE — Progress Notes (Signed)
Patient presents in nurse clinic for TB skin test. PPD placed in left forearm.  Patient to return on 6/14 to have site read.

## 2021-10-10 ENCOUNTER — Ambulatory Visit (INDEPENDENT_AMBULATORY_CARE_PROVIDER_SITE_OTHER): Payer: Commercial Managed Care - HMO

## 2021-10-10 DIAGNOSIS — Z111 Encounter for screening for respiratory tuberculosis: Secondary | ICD-10-CM

## 2021-10-10 LAB — TB SKIN TEST
Induration: 0 mm
TB Skin Test: NEGATIVE

## 2021-10-10 NOTE — Progress Notes (Signed)
Patient is here for a PPD read.  It was placed on 10/08/2021 in the left forearm @ 10 am.    PPD RESULTS:  Result: negative Induration: 0 mm  Letter created and given to patient for documentation purposes. Talbot Grumbling, RN

## 2021-10-25 ENCOUNTER — Other Ambulatory Visit: Payer: Self-pay | Admitting: Family Medicine

## 2021-10-26 ENCOUNTER — Telehealth: Payer: Self-pay | Admitting: Family Medicine

## 2021-10-26 NOTE — Telephone Encounter (Signed)
Received refill request on patient's prednisone which she has been taking intermittently for the last several years.  Have been working on weaning it and it has now been over 2 months since she got a prescription for the medication.  She reports that the only reason she continues to take it is whenever she has her skin breakout.  Discussed that she needs to be seen for further evaluation with her skin broken out and they can determine the best medication for that at the time.  She has not taken prednisone in several weeks.  Will not refill at this time.

## 2021-10-31 NOTE — Progress Notes (Signed)
    SUBJECTIVE:   CHIEF COMPLAINT / HPI: Pruritus  Pruritus Patient reports that she has been having a breakout of her skin.  She has taken prednisone for this in the past however previous provider did not recommend refilling this prescription given that she has been off of it for 2 months.  Patient reports that for the last 2 weeks she has been experiencing hyperpigmented rashes on her legs She states that she normally takes prednisone and these rashes go away along with the pruritus She reports that she has been stable off of prednisone and the rashes have returned within the last 2 weeks States that she is originally from this prednisone course for these rashes but has not followed up with dermatology  PERTINENT  PMH / PSH: Hypertension Chronic alcoholic pancreatitis Esophageal candidiasis Type 2 diabetes with neuropathy  OBJECTIVE:   BP 132/70   Pulse 88   Ht 5\' 9"  (1.753 m)   Wt 140 lb 6.4 oz (63.7 kg)   SpO2 99%   BMI 20.73 kg/m   Physical Exam Skin:    General: Skin is warm and dry.     Capillary Refill: Capillary refill takes less than 2 seconds.     Comments: Circular hyperpigmented lesions on patient's lower extremities Areas of hypopigmentation on her upper extremities Left lower extremity has erythematous macules and*hyperpigmented area      Media Information   Document Information     ASSESSMENT/PLAN:   Dermatitis Chronic dermatitis, previously treated with prednisone course Recommended that patient not continue prednisone for long peers of time due to side effects, patient voiced understanding We will prescribe triamcinolone ointment for patient to apply Patient should follow-up with dermatology to reassess appearance of rash     Ronnald Ramp, MD [facility]

## 2021-11-01 ENCOUNTER — Ambulatory Visit (INDEPENDENT_AMBULATORY_CARE_PROVIDER_SITE_OTHER): Payer: Commercial Managed Care - HMO | Admitting: Family Medicine

## 2021-11-01 ENCOUNTER — Encounter: Payer: Self-pay | Admitting: Family Medicine

## 2021-11-01 VITALS — BP 132/70 | HR 88 | Ht 69.0 in | Wt 140.4 lb

## 2021-11-01 DIAGNOSIS — L309 Dermatitis, unspecified: Secondary | ICD-10-CM

## 2021-11-01 MED ORDER — TRIAMCINOLONE ACETONIDE 0.5 % EX OINT: TOPICAL_OINTMENT | CUTANEOUS | 0 refills | Status: DC

## 2021-11-01 NOTE — Patient Instructions (Addendum)
I recommend that you be evaluated and I have dermatology clinic for a dermatology referral.  I think that we need to get a biopsy of the skin lesion to make sure that we are treating it appropriately.  In the meantime, we will prescribe a cream for you to put on this area to help with the itching.  Continue to moisturize the skin regularly at least twice per day and cover this with Vaseline  Please follow-up with our clinic in the next 2 weeks to assess this rash.

## 2021-11-03 NOTE — Assessment & Plan Note (Signed)
Chronic dermatitis, previously treated with prednisone course Recommended that patient not continue prednisone for long peers of time due to side effects, patient voiced understanding We will prescribe triamcinolone ointment for patient to apply Patient should follow-up with dermatology to reassess appearance of rash

## 2021-12-08 IMAGING — MG DIGITAL SCREENING BILAT W/ CAD
6 series · 6 of 6 positions shown · non-contrast
Comparison: None.

CLINICAL DATA: Screening.

EXAM:
DIGITAL SCREENING BILATERAL MAMMOGRAM WITH CAD

[R CC (1 of 2)]
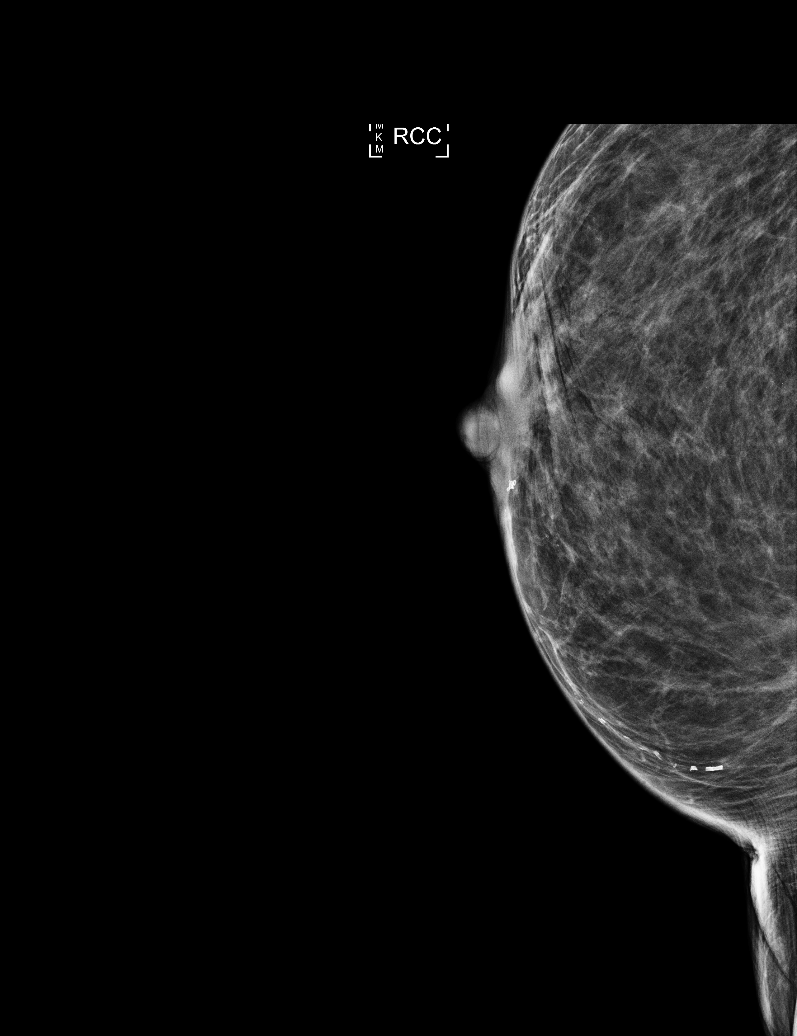

[L CC]
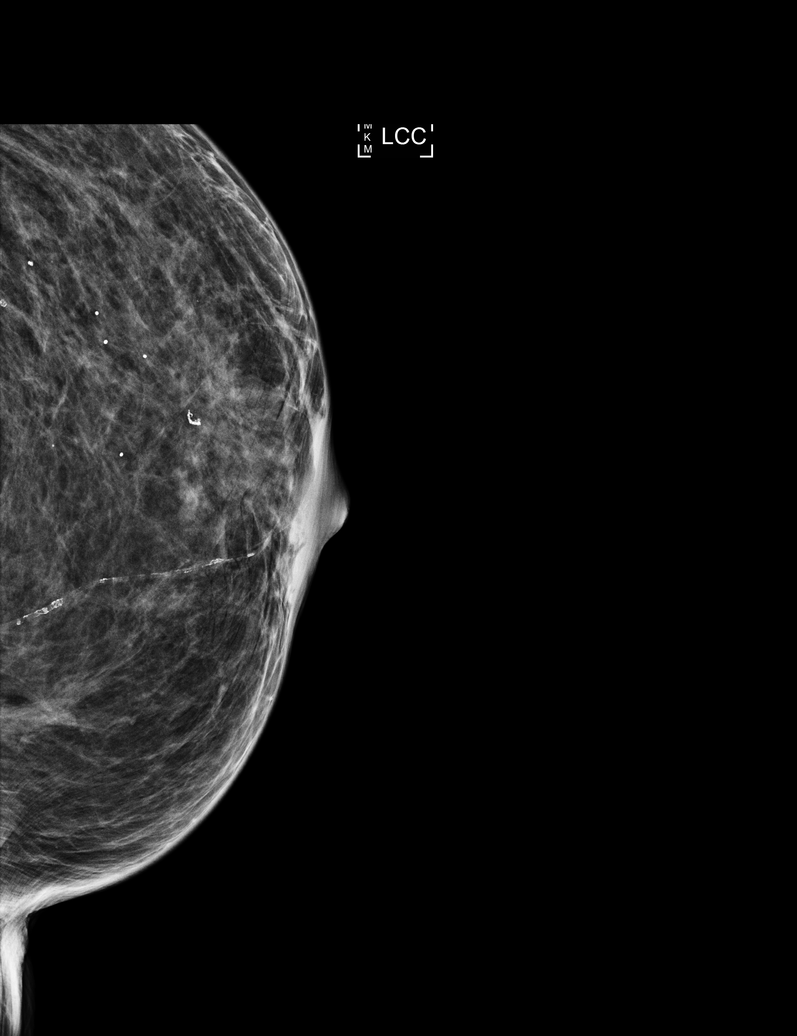

[L MLO]
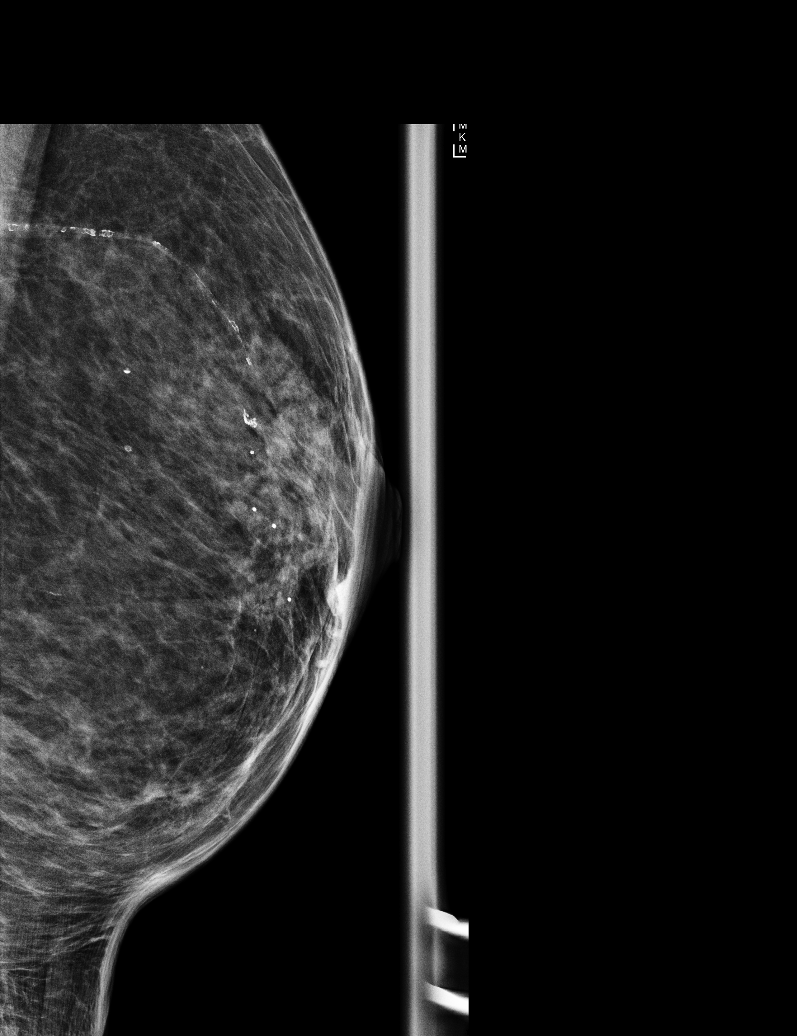

[R MLO (1 of 2)]
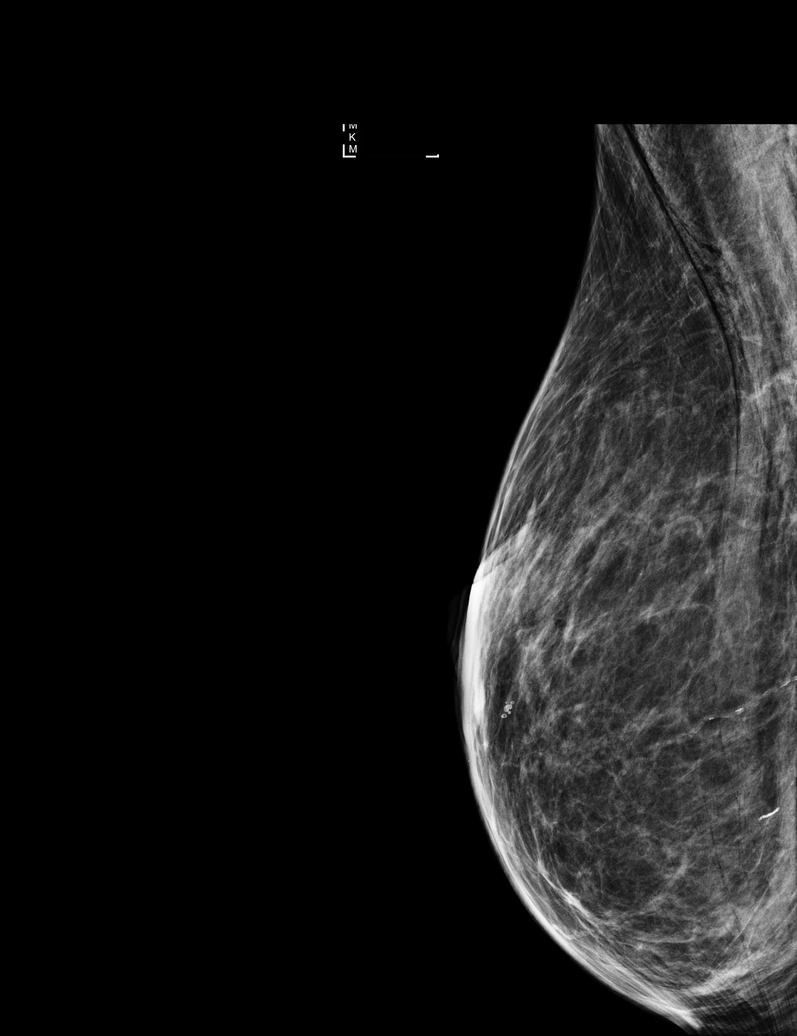

[R MLO (2 of 2)]
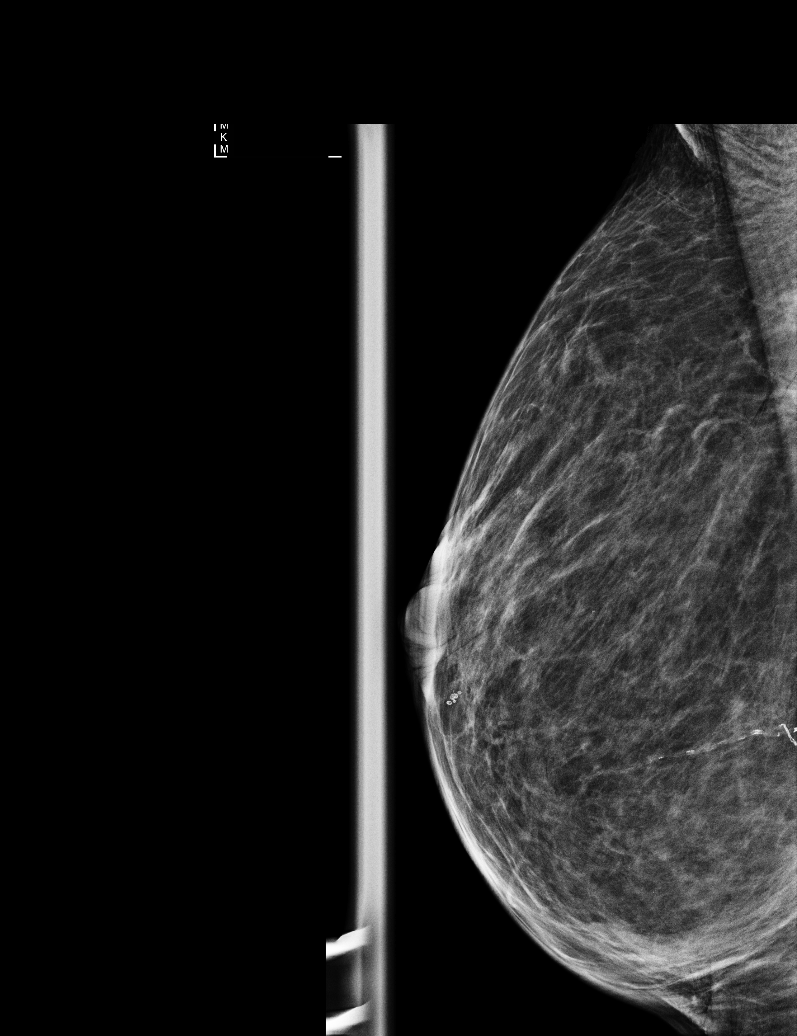

[R CC (2 of 2)]
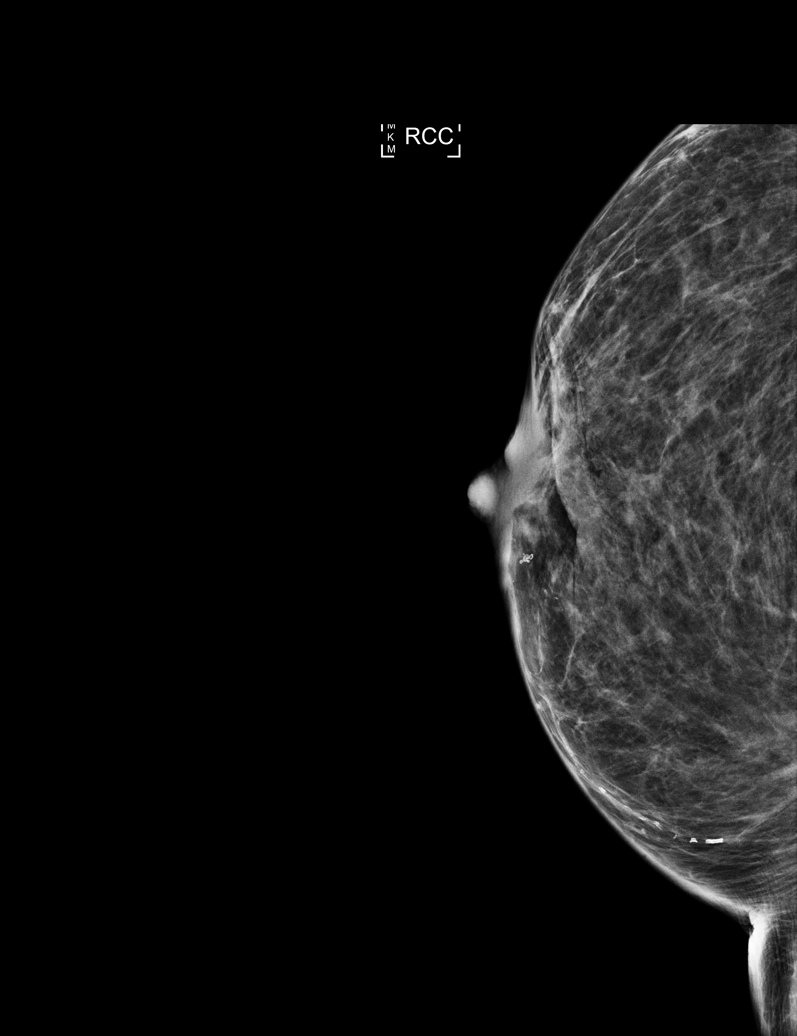

[6 of 6 positions shown; findings below may reference images not displayed]

ACR Breast Density Category c: The breast tissue is heterogeneously
dense, which may obscure small masses
FINDINGS: There are no findings suspicious for malignancy. Images were
processed with CAD.
IMPRESSION: No mammographic evidence of malignancy. A result letter of this
screening mammogram will be mailed directly to the patient.

RECOMMENDATION:
Screening mammogram in one year. (Code:[id number])

BI-RADS CATEGORY  1: Negative.

## 2022-02-20 ENCOUNTER — Ambulatory Visit (INDEPENDENT_AMBULATORY_CARE_PROVIDER_SITE_OTHER): Payer: Commercial Managed Care - HMO | Admitting: Family Medicine

## 2022-02-20 ENCOUNTER — Encounter: Payer: Self-pay | Admitting: Family Medicine

## 2022-02-20 VITALS — BP 116/68 | HR 82 | Ht 69.0 in | Wt 138.0 lb

## 2022-02-20 DIAGNOSIS — E119 Type 2 diabetes mellitus without complications: Secondary | ICD-10-CM | POA: Diagnosis not present

## 2022-02-20 DIAGNOSIS — L309 Dermatitis, unspecified: Secondary | ICD-10-CM | POA: Diagnosis not present

## 2022-02-20 LAB — POCT GLYCOSYLATED HEMOGLOBIN (HGB A1C): Hemoglobin A1C: 6.3 % — AB (ref 4.0–5.6)

## 2022-02-20 MED ORDER — CLOBETASOL PROPIONATE 0.05 % EX CREA: 1.0000 | TOPICAL_CREAM | Freq: Every day | CUTANEOUS | 0 refills | Status: DC

## 2022-02-20 NOTE — Patient Instructions (Signed)
Good to see you today - Thank you for coming in  Things we discussed today:  1) For your skin rashes, I recommend going to a Dermatologist. They provide further guidance on how best to treat your rashes.  - Start applying Clobetasol cream to affected skin areas once a day. You can stop once the rash improves.  -Avoid applying on face - Stop using the Triamcinolone cream  Follow-up with your PCP in the next month to discuss diabetes.

## 2022-02-23 NOTE — Assessment & Plan Note (Signed)
A1c 6.3 today, at goal.

## 2022-02-23 NOTE — Assessment & Plan Note (Addendum)
Pt has chronic scattered pruritic hyperpigmented and erythematous plaques scattered on extremities, palms and soles, trunk, back. Not on head or mucosal surfaces. She was previously treated with systemic prednisone, but stopped and switched to triamcinolone cream due to concern of chronic steroid use. She reports that the rash is not controlled with triamcinolone. She has had 2 prior biopsies, but did not have conclusive diagnoses. On exam, nails are splitting towards the ends. Highest on diagnosis is psoriasis given noted nail involvement. Will repeat biopsy for further workup. - Start clabetasol cream  - Stop triamcinolone - Referral to dermatology - Schedule with [facility] derm clinic to get repeat biopsies. Of note, pt had more active lesions on her shins and back during today's exam.

## 2022-02-23 NOTE — Progress Notes (Signed)
    SUBJECTIVE:   CHIEF COMPLAINT / HPI:   Christine [name] is a 51yo F h/f management of chronic dermatitis. She has a chronic pruritic rash that manifests of diffuse hyperpigmented and erythematous plaques scattered on UE, LE, trunk, abm, back, palms and soles. Does not involve head or mucosal surfaces. She has had this for many years. Was previously on systemic prednisone which helped, but was taken off due to concern for chronic steroid use. She was started on triamcinolone cream instead, which she finds minimally helpful.  She has had 2 prior biopsies that did not give conclusive diagnosis. She has not seen dermatology yet.  PERTINENT  PMH / PSH:  HTN, diabetes, HLD  OBJECTIVE:   BP 116/68   Pulse 82   Ht '5\' 9"'$  (1.753 m)   Wt 138 lb (62.6 kg)   SpO2 95%   BMI 20.38 kg/m   Gen: Thin woman. NAD. CV: RRR Resp: CTAB. Normal WOB on RA Skin: Scattered pruritic hyperpigmented and erythematous plaques scattered on extremities, palms and soles, trunk, back. Not on head or mucosal surfaces. Nails splitting towards the ends.             ASSESSMENT/PLAN:   Dermatitis Pt has chronic scattered pruritic hyperpigmented and erythematous plaques scattered on extremities, palms and soles, trunk, back. Not on head or mucosal surfaces. She was previously treated with systemic prednisone, but stopped and switched to triamcinolone cream due to concern of chronic steroid use. She reports that the rash is not controlled with triamcinolone. She has had 2 prior biopsies, but did not have conclusive diagnoses. On exam, nails are splitting towards the ends. Highest on diagnosis is psoriasis given noted nail involvement. Will repeat biopsy for further workup. - Start clabetasol cream  - Stop triamcinolone - Referral to dermatology - Schedule with [facility] derm clinic to get repeat biopsies. Of note, pt had more active lesions on her shins and back during today's exam.  Type 2 diabetes mellitus without  complication, without long-term current use of insulin (HCC) A1c 6.3 today, at goal.   Christine Dice, MD [place]

## 2022-02-28 ENCOUNTER — Ambulatory Visit (INDEPENDENT_AMBULATORY_CARE_PROVIDER_SITE_OTHER): Payer: Self-pay | Admitting: Family Medicine

## 2022-02-28 ENCOUNTER — Other Ambulatory Visit: Payer: Self-pay | Admitting: Family Medicine

## 2022-02-28 VITALS — BP 167/80 | HR 97 | Ht 69.0 in | Wt 143.8 lb

## 2022-02-28 DIAGNOSIS — L989 Disorder of the skin and subcutaneous tissue, unspecified: Secondary | ICD-10-CM | POA: Insufficient documentation

## 2022-02-28 NOTE — Progress Notes (Unsigned)
     SUBJECTIVE:   CHIEF COMPLAINT / HPI:   Lesions on legs, arms -First appeared about 3-4 months ago, scattered all over her body - including b/l palms, soles, legs, thighs, arms/elbows, abdomen, breast, and back -No lesions noted on face, scalp, or in mouth -Some dryness in right ear as well -Tried a course of oral prednisone which almost completely resolved the rash, but was taken off of this due to her diabetes; when she stopped taking it the lesions came back -Has since been using topical clobetasol without relief -Most of the lesions are itchy but are not painful. They occasionally bleed due to her scratching. No drainage otherwise -No recent fevers, medication changes -Endorses tobacco use but denies illicit drug use including IV drugs  PERTINENT  PMH / PSH: T2DM  OBJECTIVE:   BP (!) 167/80   Pulse 97   Ht '5\' 9"'$  (1.753 m)   Wt 143 lb 12.8 oz (65.2 kg)   SpO2 98%   BMI 21.24 kg/m    Gen: NAD CV: RRR no MRG Skin: Multiple scattered hyperpigmented macular lesions of various sizes and occasional excoriations, including b/l palms, soles, legs, thighs, arms/elbows, abdomen, breast, and back. Nontender to touch. No lesions noted on face, scalp, or oral mucosa                   ASSESSMENT/PLAN:   Skin lesions Assessment & Plan: Multiple skin lesions responsive to oral but not topical steroids. Unclear etiology but given involvement of palms/soles would like to rule out syphilis and HIV. Low suspicion for endocarditis given lack of murmur on exam and lack of IV drug use history. Given the widespread distribution would also consider vitamin deficiency. Will also perform punch biopsy to see if there is any underlying tissue diagnosis. Psoriasis is also a consideration given improvement with oral prednisone; however, will encourage continued topical use given hx of T2DM - will wait until patient sees dermatology to consider other systemic treatment if needed. Patient  has been referred to dermatology previously, encouraged followup and provided their number for patient to reach out  Orders: -     HIV Antibody (routine testing w rflx) -     RPR -     Dermatology pathology -     Vitamin B1; Future     Pre-op Diagnosis: unspecified skin lesions Post-op Diagnosis: Same Procedure: Punch Skin Biopsy Location: Left shin (LLE) Performing Physician: [name], MD Supervising Physician (if applicable): Dr. Gwendlyn Deutscher  Informed consent was obtained prior to the procedure. Time out performed. The area surrounding the skin lesion was prepared and cleaned with povidone-iodine swab stick and wiped clean with alcohol prep. The area was sufficiently anesthetized with 1% Lidocaine with epinephrine.  A size 97m disposable biopsy punch tool was used to obtain tissue sample and placed in labeled biopsy cup.  Silver nitrate and gauze  was used to obtain hemostasis. The site was not closed. The patient tolerated the procedure well without complications.  Standard post-procedure care was explained and return precautions given.   AAugust Albino MD [place]

## 2022-02-28 NOTE — Patient Instructions (Signed)
It was great to see you today! Thank you for choosing [facility] for your primary care. Christine [name] was seen for skin lesions.  Updates from today: We performed a punch biopsy of your left lower leg. We have sent out the sample and will let you know what the results say A referral was placed for you to see a dermatologist: Please reach out to Dr. Judith Blonder office at [phone] to schedule an appointment if you don't hear from them   If you haven't already, sign up for My Chart to have easy access to your labs results, and communication with your primary care physician.  We are checking some labs today. If they are abnormal, I will call you. If they are normal, I will send you a MyChart message (if it is active) or a letter in the mail. If you do not hear about your labs in the next 2 weeks, please call the office.   You should return to our clinic Return if symptoms worsen or fail to improve.  I recommend that you always bring your medications to each appointment as this makes it easy to ensure you are on the correct medications and helps [place] not miss refills when you need them.  Please arrive 15 minutes before your appointment to ensure smooth check in process.  We appreciate your efforts in making this happen.  Please call the clinic at [phone] if your symptoms worsen or you have any concerns.  Thank you for allowing me to participate in your care, Christine Albino, MD 02/28/2022, 3:30 PM PGY-1, [place]

## 2022-02-28 NOTE — Assessment & Plan Note (Addendum)
Multiple skin lesions responsive to oral but not topical steroids. Unclear etiology but given involvement of palms/soles would like to rule out syphilis and HIV. Low suspicion for endocarditis given lack of murmur on exam and lack of IV drug use history. Given the widespread distribution would also consider vitamin deficiency. Will also perform punch biopsy to see if there is any underlying tissue diagnosis. Psoriasis is also a consideration given improvement with oral prednisone; however, will encourage continued topical use given hx of T2DM - will wait until patient sees dermatology to consider other systemic treatment if needed. Patient has been referred to dermatology previously, encouraged followup and provided their number for patient to reach out

## 2022-03-01 ENCOUNTER — Other Ambulatory Visit: Payer: Medicaid Other

## 2022-03-07 ENCOUNTER — Telehealth: Payer: Self-pay | Admitting: Family Medicine

## 2022-03-07 DIAGNOSIS — L309 Dermatitis, unspecified: Secondary | ICD-10-CM

## 2022-03-07 MED ORDER — CLOBETASOL PROPIONATE 0.05 % EX CREA: 1.0000 | TOPICAL_CREAM | Freq: Every day | CUTANEOUS | 0 refills | Status: DC

## 2022-03-07 NOTE — Telephone Encounter (Signed)
Called patient to discussed recent skin biopsy findings.  Biopsy indicated either medication induced dermatitis or insect bite.  Per chart review lesions about ongoing for several months, unlikely due to insect bites.  No obvious culprits for medication induced dermatitis on medication list.  Discussed this with patient, and instructed to bring in all medications including supplements and over-the-counter medications at next appointment.  Scheduled appointment with Dr. Joeseph Amor for Monday, November 13.  Patient states she is almost out of clobetasol cream prescribed by Dr. Markus Jarvis, and it would not last until her appointment on Monday.  This does not cause lesions to go away however it is not helping with her pruritus.  Prescribed 15 g of clobetasol cream.  Preceptor this plan with Dr. Owens Shark.

## 2022-03-10 LAB — HIV ANTIBODY (ROUTINE TESTING W REFLEX): HIV Screen 4th Generation wRfx: NONREACTIVE

## 2022-03-10 LAB — RPR: RPR Ser Ql: NONREACTIVE

## 2022-03-10 LAB — VITAMIN B1

## 2022-03-11 ENCOUNTER — Ambulatory Visit: Payer: Medicaid Other | Admitting: Family Medicine

## 2022-03-11 NOTE — Progress Notes (Deleted)
  SUBJECTIVE:   CHIEF COMPLAINT / HPI:   Rash, ?medication-induced ***  PERTINENT  PMH / PSH: ***  Past Medical History:  Diagnosis Date   Alcohol abuse    Cellulitis 11/2016   RIGHT ARM   Chronic pancreatitis ([place])    Diabetes mellitus    Esophageal candidiasis ([place]) 03/12/2019   Hypertension    Substance abuse ([place])     OBJECTIVE:  There were no vitals taken for this visit.  General: NAD, pleasant, able to participate in exam Cardiac: RRR, no murmurs auscultated Respiratory: CTAB, normal WOB Abdomen: soft, non-tender, non-distended, normoactive bowel sounds Extremities: warm and well perfused, no edema or cyanosis Skin: warm and dry, no rashes noted Neuro: alert, no obvious focal deficits, speech normal Psych: Normal affect and mood  ASSESSMENT/PLAN:   There are no diagnoses linked to this encounter. No orders of the defined types were placed in this encounter.  No follow-ups on file.

## 2022-03-12 ENCOUNTER — Ambulatory Visit: Payer: Medicaid Other | Admitting: Family Medicine

## 2022-03-12 NOTE — Progress Notes (Deleted)
  SUBJECTIVE:   CHIEF COMPLAINT / HPI:   Rash, ?medication-induced  Dermatopathology result: There is a dermal mixed inflammatory infiltrate of lymphocytes, histiocytes and scattered eosinophils. This pattern is that of a dermal hypersensitivity reaction and may be the result of exogenous stimuli (such as an arthropod bite) or endogenous stimuli (such as a drug).  ***  PERTINENT  PMH / PSH: ***  Past Medical History:  Diagnosis Date   Alcohol abuse    Cellulitis 11/2016   RIGHT ARM   Chronic pancreatitis ([place])    Diabetes mellitus    Esophageal candidiasis ([place]) 03/12/2019   Hypertension    Substance abuse ([place])     OBJECTIVE:  There were no vitals taken for this visit.  General: NAD, pleasant, able to participate in exam Cardiac: RRR, no murmurs auscultated Respiratory: CTAB, normal WOB Abdomen: soft, non-tender, non-distended, normoactive bowel sounds Extremities: warm and well perfused, no edema or cyanosis Skin: warm and dry, no rashes noted Neuro: alert, no obvious focal deficits, speech normal Psych: Normal affect and mood  ASSESSMENT/PLAN:   There are no diagnoses linked to this encounter. No orders of the defined types were placed in this encounter.  Itchy rash with dermatopathology results suggestive of urticarial dermatitis versus other dermal hypersensitivity reaction. Feel that drug induced hypersensitivity is less likely as patient is only taking ***. No history of bug bites ***. Refractory to topical corticosteroids but was previously responsive to systemic steroids. Patient is scheduled to follow-up with dermatology***.  In the meantime, will prescribe another regimen of systemic steroids to relieve her symptoms.  Prednisone 40 mg x 2 days, 20 mg x 2 days, 10 mg x 2 days, 10 mg every other day for 4 weeks.  May also consider methotrexate weekly 10 to 15 mg.  If no resolution, patient may require other Biologics but will hopefully have dermatology follow-up  by then.   No follow-ups on file.

## 2022-03-13 ENCOUNTER — Encounter: Payer: Self-pay | Admitting: Family Medicine

## 2022-03-13 ENCOUNTER — Ambulatory Visit (INDEPENDENT_AMBULATORY_CARE_PROVIDER_SITE_OTHER): Payer: Self-pay | Admitting: Family Medicine

## 2022-03-13 VITALS — BP 160/93 | HR 89 | Ht 69.0 in | Wt 146.5 lb

## 2022-03-13 DIAGNOSIS — L989 Disorder of the skin and subcutaneous tissue, unspecified: Secondary | ICD-10-CM

## 2022-03-13 DIAGNOSIS — Z23 Encounter for immunization: Secondary | ICD-10-CM

## 2022-03-13 DIAGNOSIS — I152 Hypertension secondary to endocrine disorders: Secondary | ICD-10-CM

## 2022-03-13 DIAGNOSIS — E1159 Type 2 diabetes mellitus with other circulatory complications: Secondary | ICD-10-CM

## 2022-03-13 DIAGNOSIS — E119 Type 2 diabetes mellitus without complications: Secondary | ICD-10-CM

## 2022-03-13 MED ORDER — PREDNISONE 10 MG PO TABS
ORAL_TABLET | ORAL | 0 refills | Status: DC
  Filled 2022-03-22: qty 29, 36d supply, fill #0

## 2022-03-13 MED ORDER — BLOOD GLUCOSE MONITOR KIT: PACK | 0 refills | Status: AC

## 2022-03-13 NOTE — Patient Instructions (Addendum)
It was wonderful to see you today.  Please bring ALL of your medications with you to every visit.   Updates from today's visit:    Please review the prescription for prednisone and take it as written. Please don't use the clobetasol cream while using prednisone  Please keep a close eye on your blood sugars as we discussed and give [place] a call if your sugar stays very elevated >250s as you reduce your prednisone dose  You should get a call to schedule an appointment with the allergy clinic. Please follow up with dermatology as well when they're able to get you in.  You received your flu shot today   Please follow up in 2-3 weeks to discuss your blood pressure   Thank you for choosing [facility].   Please call [phone] with any questions about today's appointment.  Please be sure to schedule follow up at the front  desk before you leave today.   [name], MD  Family Medicine

## 2022-03-13 NOTE — Progress Notes (Signed)
SUBJECTIVE:   CHIEF COMPLAINT / HPI:   Rash -Previously seen in derm clinic 11/2, biopsy completed at that time (result below) -No progression/worsening in symptoms since derm clinic visit -No bleeding, pain, loss of sensation. Some pus drainage from lesions on leg. Pictures in media tab -Previously completely resolved with prednisone which she was taking for 1-2 years but rash returned after stopping steroids  Dermatopathology result: There is a dermal mixed inflammatory infiltrate of lymphocytes, histiocytes and scattered eosinophils. This pattern is that of a dermal hypersensitivity reaction and may be the result of exogenous stimuli (such as an arthropod bite) or endogenous stimuli (such as a drug).  T2DM -Only taking metformin and gabapentin, not taking jardiance -Unable to check sugars recently due to not having glucometer/test strips  HTN -BP elevated today, denies HA/vision changes, dizziness, lightheadedness, N/V, CP/SOB, abd pain -Would like to f/u at separate visit for BP mgmt  PERTINENT  PMH / PSH:   Past Medical History:  Diagnosis Date   Alcohol abuse    Cellulitis 11/2016   RIGHT ARM   Chronic pancreatitis ([place])    Diabetes mellitus    Esophageal candidiasis ([place]) 03/12/2019   Hypertension    Substance abuse (HCC)     OBJECTIVE:  BP (!) 160/93 Comment: provider informed  Pulse 89   Ht _0  (1.753 m)   Wt 146 lb 8 oz (66.5 kg)   SpO2 99%   BMI 21.63 kg/m   General: NAD, pleasant, able to participate in exam Respiratory: Normal WOB, no respiratory distress Skin: Pictures in media tab, unchanged appearance from prior visit.  Multiple scattered hyperpigmented macular lesions of various sizes and occasional excoriations, including bilateral palms, soles, legs, thighs, arms/elbows, abdomen, breast, and back.  Nontender to touch.  No lesions noted in face, scalp, oral mucosa.  ASSESSMENT/PLAN:   Skin lesions Assessment & Plan: Itchy rash with  dermatopathology results suggestive of urticarial dermatitis versus other dermal hypersensitivity reaction. Feel that drug induced hypersensitivity is less likely as patient is only taking metformin and gabapentin with no recent changes. No history of bug bites. Refractory to topical corticosteroids but was previously responsive to systemic steroids. Patient has been referred to dermatology but does not have an appointment yet.  In the meantime, will prescribe another regimen of systemic steroids to relieve her symptoms.  Prednisone 40 mg x 2 days, 20 mg x 2 days, 10 mg x 2 days, 10 mg every other day for 3 weeks. Will also refer to allergy for evaluation of possible hypersensitivity reaction. If no resolution, patient may require other Biologics but will hopefully have dermatology follow-up by then. -Prednisone taper as above, advised to dc clobetasol while on prednisone -encouraged keeping a close eye on blood sugars while on prednisone, sent refills of test strips and glucometer -referral to allergy -f/u with dermatology  Orders: -     predniSONE; Take 4 tablets (40 mg total) by mouth daily with breakfast for 2 days, THEN 2 tablets (20 mg total) daily with breakfast for 2 days, THEN 1 tablet (10 mg total) daily with breakfast for 2 days, THEN 1 tablet (10 mg total) every other day. Please keep an eye on your blood sugar while taking this medication and call our office if you start seeing numbers >250.Marland Kitchen  Dispense: 29 tablet; Refill: 0 -     Ambulatory referral to Allergy  Need for immunization against influenza -     Flu Vaccine QUAD 70moIM (Fluarix, Fluzone & Alfiuria Quad PF)  Encounter for immunization [employer] Fall 2023 Covid-19 Vaccine 78yr and older  Type 2 diabetes mellitus without complication, without long-term current use of insulin (HCC) -     blood glucose meter kit and supplies; Dispense based on patient and insurance preference. Use up to four times daily as directed.  Dispense:  1 each; Refill: 0  Hy[place]ension associated with diabetes ([place] Assessment & Plan: BP elevated to 160s/90s today, not currently on medication for HTN, not taking Jardiance. Denies severe HA, CP/SOB, vision changes, N/V. Pt requesting f/u in 2-3 weeks to discuss starting medication. -f/u 2-3 weeks for BP recheck and consider starting anithypertensive    Meds ordered this encounter  Medications   predniSONE (DELTASONE) 10 MG tablet    Sig: Take 4 tablets (40 mg total) by mouth daily with breakfast for 2 days, THEN 2 tablets (20 mg total) daily with breakfast for 2 days, THEN 1 tablet (10 mg total) daily with breakfast for 2 days, THEN 1 tablet (10 mg total) every other day. Please keep an eye on your blood sugar while taking this medication and call our office if you start seeing numbers >250..Marland Kitchen   Dispense:  29 tablet    Refill:  0   blood glucose meter kit and supplies KIT    Sig: Dispense based on patient and insurance preference. Use up to four times daily as directed.    Dispense:  1 each    Refill:  0    Order Specific Question:   Number of strips    Answer:   100    Order Specific Question:   Number of lancets    Answer:   100     Return in about 2 weeks (around 03/27/2022) for BP check and T2DM.

## 2022-03-13 NOTE — Assessment & Plan Note (Signed)
BP elevated to 160s/90s today, not currently on medication for HTN, not taking Jardiance. Denies severe HA, CP/SOB, vision changes, N/V. Pt requesting f/u in 2-3 weeks to discuss starting medication. -f/u 2-3 weeks for BP recheck and consider starting anithypertensive

## 2022-03-13 NOTE — Assessment & Plan Note (Addendum)
Itchy rash with dermatopathology results suggestive of urticarial dermatitis versus other dermal hypersensitivity reaction. No signs of infection today. Feel that drug induced hypersensitivity is less likely as patient is only taking metformin and gabapentin with no recent changes. No history of bug bites. Refractory to topical corticosteroids but was previously responsive to systemic steroids. Patient has been referred to dermatology but does not have an appointment yet.  In the meantime, will prescribe another regimen of systemic steroids to relieve her symptoms.  Prednisone 40 mg x 2 days, 20 mg x 2 days, 10 mg x 2 days, 10 mg every other day for 3 weeks. Will also refer to allergy for evaluation of possible hypersensitivity reaction. If no resolution, patient may require other Biologics but will hopefully have dermatology follow-up by then. -Prednisone taper as above, advised to dc clobetasol while on prednisone -encouraged keeping a close eye on blood sugars while on prednisone, sent refills of test strips and glucometer -referral to allergy -f/u with dermatology

## 2022-03-14 ENCOUNTER — Other Ambulatory Visit: Payer: Self-pay

## 2022-03-14 MED ORDER — ACCU-CHEK GUIDE VI STRP: ORAL_STRIP | 12 refills | Status: AC

## 2022-03-14 MED ORDER — ACCU-CHEK FASTCLIX LANCETS MISC: 2 refills | Status: AC

## 2022-03-14 NOTE — Telephone Encounter (Signed)
Patient calls nurse line due to not being able to pick up glucometer and supplies. Called pharmacy. They need separate prescription for strips and lancets.   Sent in prescription under preceptor ([place])  Talbot Grumbling, RN

## 2022-03-22 ENCOUNTER — Other Ambulatory Visit (HOSPITAL_COMMUNITY): Payer: Self-pay

## 2022-04-16 ENCOUNTER — Other Ambulatory Visit: Payer: Self-pay

## 2022-04-16 ENCOUNTER — Encounter (HOSPITAL_COMMUNITY): Payer: Self-pay

## 2022-04-16 ENCOUNTER — Ambulatory Visit (HOSPITAL_COMMUNITY)
Admission: EM | Admit: 2022-04-16 | Discharge: 2022-04-16 | Disposition: A | Discharge status: Home / Self Care | Payer: Medicaid Other | Attending: Emergency Medicine | Admitting: Emergency Medicine

## 2022-04-16 DIAGNOSIS — R21 Rash and other nonspecific skin eruption: Secondary | ICD-10-CM | POA: Diagnosis not present

## 2022-04-16 MED ORDER — PREDNISONE 20 MG PO TABS: 20.0000 mg | ORAL_TABLET | Freq: Every day | ORAL | 0 refills | Status: DC

## 2022-04-16 MED ORDER — CEPHALEXIN 500 MG PO CAPS: 500.0000 mg | ORAL_CAPSULE | Freq: Two times a day (BID) | ORAL | 0 refills | Status: DC

## 2022-04-16 MED ORDER — GABAPENTIN 300 MG PO CAPS: ORAL_CAPSULE | ORAL | 1 refills | Status: DC

## 2022-04-16 MED ORDER — HYDROCORTISONE 2.5 % EX LOTN: TOPICAL_LOTION | Freq: Two times a day (BID) | CUTANEOUS | 0 refills | Status: DC

## 2022-04-16 MED ORDER — HYDROXYZINE HCL 25 MG PO TABS: 25.0000 mg | ORAL_TABLET | Freq: Every evening | ORAL | 0 refills | Status: DC

## 2022-04-16 NOTE — ED Provider Notes (Signed)
[facility]    CSN: [id number] Arrival date & time: 04/16/22  1600      History   Chief Complaint Chief Complaint  Patient presents with   Rash    HPI Christine [name] is a 51 y.o. female.   HPI  Past Medical History:  Diagnosis Date   Alcohol abuse    Cellulitis 11/2016   RIGHT ARM   Chronic pancreatitis ([place])    Diabetes mellitus    Esophageal candidiasis ([place]) 03/12/2019   Hypertension    Substance abuse ([place])     Patient Active Problem List   Diagnosis Date Noted   Skin lesions 02/28/2022   Dermatitis 11/01/2021   Low back pain 07/31/2021   Hypertension associated with diabetes ([place])    Bacteremia, escherichia coli    Leukocytosis    Type 2 diabetes mellitus without complication, without long-term current use of insulin ([place])    Pyelonephritis 10/22/2020   Hyperkalemia 02/09/2020   Underweight 12/14/2019   Abnormal ankle brachial index (ABI) 10/06/2019   Shortness of breath 07/15/2019   Tachycardia 07/15/2019   Long term (current) use of systemic steroids 05/12/2019   Esophageal candidiasis ([place]) 03/12/2019   Fatigue 12/14/2018   Chronic cough 07/28/2018   Alcohol use 03/30/2018   Abnormal weight loss 01/13/2017   Chronic contact dermatitis 11/19/2016   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      Home Medications    Prior to Admission medications   Medication Sig Start Date End  Date Taking? Authorizing Provider  Accu-Chek FastClix Lancets MISC Please use to check blood sugar up to 4 times daily. E11.9 03/14/22   Zenia Resides, MD  blood glucose meter kit and supplies KIT Dispense based on patient and insurance preference. Use up to four times daily as directed. 03/13/22   [name], MD  Chlorphen-Phenyleph-ASA (ALKA-SELTZER PLUS COLD PO) Take 2 packets by mouth in the morning and at bedtime.    [provider]  clobetasol cream (TEMOVATE) 1.93 % Apply 1 Application topically daily. Apply to affected skin areas once a day until rash heals 03/07/22   Salvadore Oxford, MD  gabapentin (NEURONTIN) 300 MG capsule TAKE 1 CAPSULE BY MOUTH THREE TIMES A DAY Strength: 300 mg 04/16/22   Alen Bleacher, MD  glucose blood (ACCU-CHEK GUIDE) test strip Please use to check blood sugar up to 4 times daily. E11.9 03/14/22   Zenia Resides, MD  ibuprofen (ADVIL) 200 MG tablet Take 400 mg by mouth in the morning and at bedtime.    [provider]  OVER THE COUNTER MEDICATION Take 2 tablets by mouth daily as needed (cold symptoms). Pseudoephedrine po    [provider]    Family History History reviewed. No pertinent family history.  Social History Social History   Tobacco Use   Smoking status: Every Day    Packs/day: 1.00    Years: 22.00    Total pack years: 22.00  Types: Cigarettes   Smokeless tobacco: Never   Tobacco comments:    working on quitting  Vaping Use   Vaping Use: Never used  Substance Use Topics   Alcohol use: Yes    Alcohol/week: 0.0 standard drinks of alcohol    Comment: 11/2016 " i DRINK LESS THAN i USED TO,BUT i DRINK BEER EVERYDAY   Drug use: Not Currently    Comment: last use two weeks ago     Allergies   Patient has no known allergies.   Review of Systems Review of Systems   Physical Exam Triage Vital Signs ED Triage Vitals [04/16/22 1923]  Enc Vitals Group     BP (!) 191/87     Pulse Rate 94     Resp  18     Temp 98.4 F (36.9 C)     Temp Source Oral     SpO2 99 %     Weight      Height      Head Circumference      Peak Flow      Pain Score 10     Pain Loc      Pain Edu?      Excl. in [place]?    No data found.  Updated Vital Signs BP (!) 191/87 (BP Location: Left Arm)   Pulse 94   Temp 98.4 F (36.9 C) (Oral)   Resp 18   SpO2 99%   Visual Acuity Right Eye Distance:   Left Eye Distance:   Bilateral Distance:    Right Eye Near:   Left Eye Near:    Bilateral Near:     Physical Exam          UC Treatments / Results  Labs (all labs ordered are listed, but only abnormal results are displayed) Labs Reviewed - No data to display  EKG   Radiology No results found.  Procedures Procedures (including critical care time)  Medications Ordered in UC Medications - No data to display  Initial Impression / Assessment and Plan / UC Course  I have reviewed the triage vital signs and the nursing notes.  Pertinent labs & imaging results that were available during my care of the patient were reviewed by me and considered in my medical decision making (see chart for details).     Dermatology result from pathology result on 11/02 " There is a dermal mixed inflammatory infiltrate of lymphocytes, histiocytes and scattered eosinophils. This pattern is that of a dermal hypersensitivity reaction and may be the result of exogenous stimuli (such as an arthropod bite) or endogenous stimuli (such as a drug)." Final Clinical Impressions(s) / UC Diagnoses   Final diagnoses:  None   Discharge Instructions   None    ED Prescriptions   None    PDMP not reviewed this encounter.

## 2022-04-16 NOTE — ED Triage Notes (Signed)
Pt states seen and tx'd by her PCP on 03/13/2022 for same and it returned in a week after tx'd and now spreading. Pt c/o painful, itchy rash to bilateral lower legs, back, stomach, chest and arms.

## 2022-04-16 NOTE — Discharge Instructions (Addendum)
Keflex has been sent to the pharmacy, you will take this 2 times daily for the next 5 days.  A prednisone course has been sent to the pharmacy, you will take this in the morning for the next 5 days.  Hydrocortisone lotion has been sent to the pharmacy, you will place this at the site of rash 2 times daily.  Hydroxyzine has been sent for you for bedtime use, due to itching.  You will need to schedule an immediate follow-up with dermatology, please call the dermatology office tomorrow and schedule an appointment.

## 2022-05-14 ENCOUNTER — Emergency Department (HOSPITAL_COMMUNITY)
Admission: EM | Admit: 2022-05-14 | Discharge: 2022-05-14 | Disposition: A | Discharge status: Home / Self Care | Payer: No Typology Code available for payment source | Attending: Emergency Medicine | Admitting: Emergency Medicine

## 2022-05-14 ENCOUNTER — Encounter (HOSPITAL_COMMUNITY): Payer: Self-pay

## 2022-05-14 ENCOUNTER — Other Ambulatory Visit: Payer: Self-pay

## 2022-05-14 DIAGNOSIS — R21 Rash and other nonspecific skin eruption: Secondary | ICD-10-CM | POA: Insufficient documentation

## 2022-05-14 LAB — CBC WITH DIFFERENTIAL/PLATELET
Abs Immature Granulocytes: 0.02 10*3/uL (ref 0.00–0.07)
Basophils Absolute: 0 10*3/uL (ref 0.0–0.1)
Basophils Relative: 1 %
Eosinophils Absolute: 0.4 10*3/uL (ref 0.0–0.5)
Eosinophils Relative: 9 %
HCT: 30.5 % — ABNORMAL LOW (ref 36.0–46.0)
Hemoglobin: 10.3 g/dL — ABNORMAL LOW (ref 12.0–15.0)
Immature Granulocytes: 1 %
Lymphocytes Relative: 22 %
Lymphs Abs: 0.9 10*3/uL (ref 0.7–4.0)
MCH: 31.7 pg (ref 26.0–34.0)
MCHC: 33.8 g/dL (ref 30.0–36.0)
MCV: 93.8 fL (ref 80.0–100.0)
Monocytes Absolute: 0.5 10*3/uL (ref 0.1–1.0)
Monocytes Relative: 12 %
Neutro Abs: 2.3 10*3/uL (ref 1.7–7.7)
Neutrophils Relative %: 55 %
Platelets: 262 10*3/uL (ref 150–400)
RBC: 3.25 MIL/uL — ABNORMAL LOW (ref 3.87–5.11)
RDW: 13.9 % (ref 11.5–15.5)
WBC: 4.1 10*3/uL (ref 4.0–10.5)
nRBC: 0 % (ref 0.0–0.2)

## 2022-05-14 LAB — BASIC METABOLIC PANEL
Anion gap: 10 (ref 5–15)
BUN: 14 mg/dL (ref 6–20)
CO2: 20 mmol/L — ABNORMAL LOW (ref 22–32)
Calcium: 8.6 mg/dL — ABNORMAL LOW (ref 8.9–10.3)
Chloride: 109 mmol/L (ref 98–111)
Creatinine, Ser: 1.46 mg/dL — ABNORMAL HIGH (ref 0.44–1.00)
GFR, Estimated: 43 mL/min — ABNORMAL LOW (ref >60)
Glucose, Bld: 174 mg/dL — ABNORMAL HIGH (ref 70–99)
Potassium: 4.1 mmol/L (ref 3.5–5.1)
Sodium: 139 mmol/L (ref 135–145)

## 2022-05-14 MED ORDER — PREDNISONE 20 MG PO TABS: 20.0000 mg | ORAL_TABLET | Freq: Every day | ORAL | 0 refills | Status: DC

## 2022-05-14 MED ORDER — HYDROCORTISONE 1 % EX CREA
TOPICAL_CREAM | Freq: Two times a day (BID) | CUTANEOUS | Status: DC
  Administered 2022-05-14: 1 via TOPICAL
  Filled 2022-05-14: qty 28

## 2022-05-14 MED ORDER — HYDROXYZINE HCL 25 MG PO TABS: 25.0000 mg | ORAL_TABLET | Freq: Four times a day (QID) | ORAL | 0 refills | Status: DC

## 2022-05-14 MED ORDER — HYDROCORTISONE 2.5 % EX LOTN: TOPICAL_LOTION | Freq: Two times a day (BID) | CUTANEOUS | 0 refills | Status: AC

## 2022-05-14 MED ORDER — HYDROXYZINE HCL 10 MG PO TABS
10.0000 mg | ORAL_TABLET | Freq: Once | ORAL | Status: AC
  Administered 2022-05-14: 10 mg via ORAL
  Filled 2022-05-14: qty 1

## 2022-05-14 MED ORDER — HYDROXYZINE HCL 25 MG PO TABS: 25.0000 mg | ORAL_TABLET | Freq: Four times a day (QID) | ORAL | 1 refills | Status: DC | PRN

## 2022-05-14 MED ORDER — PREDNISONE 20 MG PO TABS
60.0000 mg | ORAL_TABLET | ORAL | Status: AC
  Administered 2022-05-14: 60 mg via ORAL
  Filled 2022-05-14: qty 3

## 2022-05-14 NOTE — ED Provider Notes (Signed)
[facility] Provider Note   CSN: [id number] Arrival date & time: 05/14/22  1041     History  Chief Complaint  Patient presents with   Rash    Christine [name] is a 52 y.o. female.  HPI Patient presents with concern of rash.  Over the past years, possibly 3, she has had outbreaks of skin lesions.  Today she presents with concern of diffuse pruritus with prominent dermatologic changes in her anterior shins, shoulders, arms.  No fever, nausea, vomiting.  She notes that she completed a course of antibiotics, topical and oral about a month ago after being seen in urgent care.  At that course of medication seemed to change her symptoms are markedly, but they have not returned since that time.  She has dermatology visit scheduled in 2 weeks, can follow-up with primary care as well.    Home Medications Prior to Admission medications   Medication Sig Start Date End Date Taking? Authorizing Provider  hydrOXYzine (ATARAX) 25 MG tablet Take 1 tablet (25 mg total) by mouth every 6 (six) hours. 05/14/22  Yes Carmin Muskrat, MD  Accu-Chek FastClix Lancets MISC Please use to check blood sugar up to 4 times daily. E11.9 03/14/22   Zenia Resides, MD  blood glucose meter kit and supplies KIT Dispense based on patient and insurance preference. Use up to four times daily as directed. 03/13/22   [name], MD  cephALEXin (KEFLEX) 500 MG capsule Take 1 capsule (500 mg total) by mouth 2 (two) times daily. 04/16/22   Flossie Dibble, NP  clobetasol cream (TEMOVATE) 2.75 % Apply 1 Application topically daily. Apply to affected skin areas once a day until rash heals 03/07/22   Salvadore Oxford, MD  gabapentin (NEURONTIN) 300 MG capsule TAKE 1 CAPSULE BY MOUTH THREE TIMES A DAY Strength: 300 mg 04/16/22   Alen Bleacher, MD  glucose blood (ACCU-CHEK GUIDE) test strip Please use to check blood sugar up to 4 times daily. E11.9 03/14/22   Zenia Resides, MD   hydrocortisone 2.5 % lotion Apply topically 2 (two) times daily. Place on sites of rash 2 times daily. 05/14/22   Carmin Muskrat, MD  hydrOXYzine (ATARAX) 25 MG tablet Take 1 tablet (25 mg total) by mouth every 6 (six) hours as needed for itching. 05/14/22 07/13/22  Redwine, Madison A, PA-C  ibuprofen (ADVIL) 200 MG tablet Take 400 mg by mouth in the morning and at bedtime.    [provider]  OVER THE COUNTER MEDICATION Take 2 tablets by mouth daily as needed (cold symptoms). Pseudoephedrine po    [provider]  predniSONE (DELTASONE) 20 MG tablet Take 1 tablet (20 mg total) by mouth daily. 05/14/22   Carmin Muskrat, MD      Allergies    Patient has no known allergies.    Review of Systems   Review of Systems  All other systems reviewed and are negative.   Physical Exam Updated Vital Signs BP (!) 179/93 (BP Location: Left Arm)   Pulse 89   Temp 97.6 F (36.4 C) (Oral)   Resp 18   SpO2 100%  Physical Exam Vitals and nursing note reviewed.  Constitutional:      General: She is not in acute distress.    Appearance: She is well-developed.  HENT:     Head: Normocephalic and atraumatic.  Eyes:     Conjunctiva/sclera: Conjunctivae normal.  Pulmonary:     Effort: Pulmonary effort is normal. No  respiratory distress.     Breath sounds: No stridor.  Abdominal:     General: There is no distension.  Skin:    General: Skin is warm and dry.       Neurological:     Mental Status: She is alert and oriented to person, place, and time.     Cranial Nerves: No cranial nerve deficit.  Psychiatric:        Mood and Affect: Mood normal.     ED Results / Procedures / Treatments   Labs (all labs ordered are listed, but only abnormal results are displayed) Labs Reviewed  CBC WITH DIFFERENTIAL/PLATELET - Abnormal; Notable for the following components:      Result Value   RBC 3.25 (*)    Hemoglobin 10.3 (*)    HCT 30.5 (*)    All other components within normal  limits  BASIC METABOLIC PANEL - Abnormal; Notable for the following components:   CO2 20 (*)    Glucose, Bld 174 (*)    Creatinine, Ser 1.46 (*)    Calcium 8.6 (*)    GFR, Estimated 43 (*)    All other components within normal limits    EKG None  Radiology No results found.  Procedures Procedures    Medications Ordered in ED Medications  predniSONE (DELTASONE) tablet 60 mg (has no administration in time range)  hydrocortisone cream 1 % (has no administration in time range)  hydrOXYzine (ATARAX) tablet 10 mg (has no administration in time range)    ED Course/ Medical Decision Making/ A&P                             Medical Decision Making With history of hypertension, ongoing challenges with dermatologic conditions presents with diffuse pruritus, cutaneous lesions.  No evidence for bacteremia, sepsis with no fever, no work breathing that is increased, no hypotension, no tachycardia.  Suspicion for psoriatic lesions given recurrence following cessation of steroids.  Patient started oral, topical meds, Atarax, will follow-up with dermatology and primary care.  Amount and/or Complexity of Data Reviewed External Data Reviewed: notes.    Details: Care notes from last month reviewed, as above  Risk Prescription drug management.  Final Clinical Impression(s) / ED Diagnoses Final diagnoses:  Rash    Rx / DC Orders ED Discharge Orders          Ordered    hydrOXYzine (ATARAX) 25 MG tablet  Every 6 hours PRN        05/14/22 1056    hydrocortisone 2.5 % lotion  2 times daily        05/14/22 1436    predniSONE (DELTASONE) 20 MG tablet  Daily        05/14/22 1436    hydrOXYzine (ATARAX) 25 MG tablet  Every 6 hours        05/14/22 1436              Carmin Muskrat, MD 05/14/22 1436

## 2022-05-14 NOTE — Discharge Instructions (Addendum)
Use all medication as we discussed and follow-up with your dermatologist in 2 weeks.  Return here for concerning changes in your condition.

## 2022-05-14 NOTE — ED Triage Notes (Signed)
Pt BIB [facility] from home d/t widespread rash that she has been trying to treat for the past couple of months. Her primary gave her prednisolone & the rash did not get any worse, then once finished it did worsen. Pt reports ankles are swollen & scaly & benadryl is not helping the constant itching. She came in today d/t the rash beginning to drain puss. A/Ox4, no fever, 174/100, 80 bpm, CBG 293.

## 2022-05-14 NOTE — ED Provider Triage Note (Signed)
Emergency Medicine Provider Triage Evaluation Note  Christine [name] , a 52 y.o. female  was evaluated in triage.  Patient complains of itchy rash.  She has been struggling with it for months at this point.  Has seen dermatology who did a biopsy and thinks she may have been bit by something.  She is having severe pain to her legs and itching all over.  Hydroxyzine helped her but she has run out of this.   I have refilled hydroxyzine from triage  Review of Systems  Positive:  Negative:   Physical Exam  BP (!) 179/93 (BP Location: Left Arm)   Pulse 89   Temp 97.6 F (36.4 C) (Oral)   Resp 18   SpO2 100%  Gen:   Awake, no distress   Resp:  Normal effort  MSK:   Moves extremities without difficulty  Other:  Scaly flaky rash to the anterior bilateral lower extremities.  Also some annular scaly lesions to the patient's back and abdomen.  Medical Decision Making  Medically screening exam initiated at 10:56 AM.  Appropriate orders placed.  Christine [name] was informed that the remainder of the evaluation will be completed by another provider, this initial triage assessment does not replace that evaluation, and the importance of remaining in the ED until their evaluation is complete.     Christine Hammock, PA-C 05/14/22 1057

## 2022-05-28 ENCOUNTER — Other Ambulatory Visit: Payer: Self-pay

## 2022-05-28 ENCOUNTER — Encounter: Payer: Self-pay | Admitting: Allergy & Immunology

## 2022-05-28 ENCOUNTER — Ambulatory Visit (INDEPENDENT_AMBULATORY_CARE_PROVIDER_SITE_OTHER): Payer: Medicaid Other | Admitting: Allergy & Immunology

## 2022-05-28 ENCOUNTER — Other Ambulatory Visit: Payer: Self-pay | Admitting: Allergy & Immunology

## 2022-05-28 VITALS — BP 128/80 | HR 102 | Temp 98.3°F | Resp 16 | Ht 69.0 in | Wt 145.1 lb

## 2022-05-28 DIAGNOSIS — R21 Rash and other nonspecific skin eruption: Secondary | ICD-10-CM | POA: Diagnosis not present

## 2022-05-28 DIAGNOSIS — L299 Pruritus, unspecified: Secondary | ICD-10-CM

## 2022-05-28 MED ORDER — TRIAMCINOLONE ACETONIDE 0.1 % EX OINT: TOPICAL_OINTMENT | CUTANEOUS | 1 refills | Status: DC

## 2022-05-28 MED ORDER — CETIRIZINE HCL 10 MG PO TABS: 10.0000 mg | ORAL_TABLET | Freq: Two times a day (BID) | ORAL | 1 refills | Status: DC

## 2022-05-28 NOTE — Telephone Encounter (Signed)
Pa for twice a day please

## 2022-05-28 NOTE — Patient Instructions (Addendum)
1. Rash - The rash looks more consistent with psoriasis. - Information on psoriasis provided.  - I am going to get the results from the Dermatologist to see what they were thinking. - Continue with hydroxyzine 25 mg as needed. - Add on cetirizine '10mg'$  twice daily to stay ahead of the itching. - Continue with moisturizing.  - Add on triamcinolone twice daily to the areas. - This just does not look like eczema with the distribution pattern on your skin, but we could consider adding on something like Dupixent to help with itch control.  - We rae going to get a lot of labs to rule out weird causes of rashes/itching. - We will call you in 1-2 weeks with the results of the testing.   2. Return in about 4 weeks (around 06/25/2022).    Please inform us of any Emergency Department visits, hospitalizations, or changes in symptoms. Call us before going to the ED for breathing or allergy symptoms since we might be able to fit you in for a sick visit. Feel free to contact us anytime with any questions, problems, or concerns.  It was a pleasure to meet you today!  Websites that have reliable patient information: 1. American Academy of Asthma, Allergy, and Immunology: www.aaaai.org 2. Food Allergy Research and Education (FARE): foodallergy.org 3. Mothers of Asthmatics: http://www.asthmacommunitynetwork.org 4. American College of Allergy, Asthma, and Immunology: www.acaai.org   COVID-19 Vaccine Information can be found at: ShippingScam.co.uk For questions related to vaccine distribution or appointments, please email [email or web address] or call [phone].   We realize that you might be concerned about having an allergic reaction to the COVID19 vaccines. To help with that concern, WE ARE OFFERING THE COVID19 VACCINES IN OUR OFFICE! Ask the front desk for dates!     "Like" [place] on Facebook and Instagram for our latest updates!      A  healthy democracy works best when [employer] participate! Make sure you are registered to vote! If you have moved or changed any of your contact information, you will need to get this updated before voting!  In some cases, you MAY be able to register to vote online: [email or web address]

## 2022-05-28 NOTE — Progress Notes (Signed)
NEW PATIENT  Date of Service/Encounter:  05/28/22  Consult requested by: Alen Bleacher, MD   Assessment:   Rash - ? psoriasis  Pruritus   Plan/Recommendations:    1. Rash - The rash looks more consistent with psoriasis. - Information on psoriasis provided.  - I am going to get the results from the Dermatologist to see what they were thinking. - Continue with hydroxyzine 25 mg as needed. - Add on cetirizine '10mg'$  twice daily to stay ahead of the itching. - Continue with moisturizing.  - Add on triamcinolone twice daily to the areas. - This just does not look like eczema with the distribution pattern on your skin, but we could consider adding on something like Dupixent to help with itch control.  - We rae going to get a lot of labs to rule out weird causes of rashes/itching. - We will call you in 1-2 weeks with the results of the testing.   2. Return in about 4 weeks (around 06/25/2022).   This note in its entirety was forwarded to the Provider who requested this consultation.  Subjective:   Christine [name] is a 52 y.o. female presenting today for evaluation of  Chief Complaint  Patient presents with   Allergy Testing   Rash   Pruritus   Angioedema    Left foot     Jaydon A Brandes has a history of the following: Patient Active Problem List   Diagnosis Date Noted   Skin lesions 02/28/2022   Dermatitis 11/01/2021   Low back pain 07/31/2021   Hypertension associated with diabetes ([place])    Bacteremia, escherichia coli    Leukocytosis    Type 2 diabetes mellitus without complication, without long-term current use of insulin ([place])    Pyelonephritis 10/22/2020   Hyperkalemia 02/09/2020   Underweight 12/14/2019   Abnormal ankle brachial index (ABI) 10/06/2019   Shortness of breath 07/15/2019   Tachycardia 07/15/2019   Long term (current) use of systemic steroids 05/12/2019   Esophageal candidiasis ([place]) 03/12/2019   Fatigue 12/14/2018   Chronic cough 07/28/2018    Alcohol use 03/30/2018   Abnormal weight loss 01/13/2017   Chronic contact dermatitis 11/19/2016   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  Christine [name] was referred by Alen Bleacher, MD.     Christine [name] is a 52 y.o. female presenting for an evaluation of a rash .  It has been going on for a couple of years in total. It comes and goes. Patient reports that it will be done for several weeks (with the prednisone courses). Once she is off of the prednisone, it gets worse. She has been on prednisone for a "long time". PCP has been giving her the prednisone. She has been trying to get weaned of this. She reports that she had foot swelling which started after the rash. She has not seen anyone besides the PCP and the Dermatologist for all of this. It does itch. She tries not to scratch it.   She had a biopsy done twice. She had a biopsy performed that showed  mixed inflammatory infiltrate with lymphocytes, histiocytes, and eosinophils.  There was dermal hypersensitivity and the differential included arthropod bite versus drug reaction. She denies any new medications. She does not think that this is related to medications.   No one in her family has this at all. It is not related to foods at all.  The whole time course of the rest is really hard to get a sense of. She does have some joint pains with this. This is generalized joint pain. She denies fevers.  She has never seen rheumatology.  She has no atopic history at all.  She has no  runny nose, sneezing, cough, or congestion.  She has never had allergy testing.  She eats all of the major food allergens without adverse event.  Otherwise, there is no history of other atopic diseases, including asthma, food allergies, drug allergies, environmental allergies, stinging insect allergies, or contact dermatitis. There is no significant infectious history. Vaccinations are up to date.    Past Medical History: Patient Active Problem List   Diagnosis Date Noted   Skin lesions 02/28/2022   Dermatitis 11/01/2021   Low back pain 07/31/2021   Hypertension associated with diabetes ([place])    Bacteremia, escherichia coli    Leukocytosis    Type 2 diabetes mellitus without complication, without long-term current use of insulin ([place])    Pyelonephritis 10/22/2020   Hyperkalemia 02/09/2020   Underweight 12/14/2019   Abnormal ankle brachial index (ABI) 10/06/2019   Shortness of breath 07/15/2019   Tachycardia 07/15/2019   Long term (current) use of systemic steroids 05/12/2019   Esophageal candidiasis ([place]) 03/12/2019   Fatigue 12/14/2018   Chronic cough 07/28/2018   Alcohol use 03/30/2018   Abnormal weight loss 01/13/2017   Chronic contact dermatitis 11/19/2016   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 If you have any questions, ask your nurse or doctor.          STOP taking these medications    cephALEXin 500  MG capsule Commonly known as: KEFLEX Stopped by: Valentina Shaggy, MD   clobetasol cream 0.05 % Commonly known as: TEMOVATE Stopped by: Valentina Shaggy, MD   predniSONE 20 MG tablet Commonly known as: DELTASONE Stopped by: Valentina Shaggy, MD       TAKE these medications    Accu-Chek FastClix Lancets Misc Please use to check blood sugar up to 4 times daily. E11.9   Accu-Chek Guide test strip Generic drug: glucose blood Please use to check blood sugar up to 4 times daily. E11.9   blood glucose meter kit and supplies Kit Dispense based on patient and insurance preference. Use up to four times daily as directed.   cetirizine 10 MG tablet Commonly known as: ZYRTEC Take 1 tablet (10 mg total) by mouth 2 (two) times daily. Started by: Valentina Shaggy, MD   gabapentin 300 MG capsule Commonly known as: NEURONTIN TAKE 1 CAPSULE  BY MOUTH THREE TIMES A DAY Strength: 300 mg   hydrocortisone 2.5 % lotion Apply topically 2 (two) times daily. Place on sites of rash 2 times daily.   hydrOXYzine 25 MG tablet Commonly known as: ATARAX Take 1 tablet (25 mg total) by mouth every 6 (six) hours as needed for itching.   hydrOXYzine 25 MG tablet Commonly known as: ATARAX Take 1 tablet (25 mg total) by mouth every 6 (six) hours.   ibuprofen 200 MG tablet Commonly known as: ADVIL Take 400 mg by mouth in the morning and at bedtime.   OVER THE COUNTER MEDICATION Take 2 tablets by mouth daily as needed (cold symptoms). Pseudoephedrine po   triamcinolone ointment 0.1 % Commonly known as: KENALOG 1 application 2 times daily as needed to affected area. Avoid face, armpit, and groin. Do not use for more than 2 weeks in a row. Started by: Valentina Shaggy, MD        Birth History: non-contributory  Developmental History: non-contributory  Past Surgical History: Past Surgical History:  Procedure Laterality Date   BREAST REDUCTION SURGERY       Family  History: History reviewed. No pertinent family history.   Social History: [place]a lives at home with her family.  She lives in a house.  There is carpeting throughout the house.  She has gas heating and central cooling.  There are dust mite covers on the bedding.  There is cigarette exposure.  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  Specifically, she works at [employer].  She has worked her on a number of occasions over the past several years, but none of them have correlated with her symptoms, worsening or improving.   Review of Systems  Constitutional: Negative.  Negative for chills, fever, malaise/fatigue and weight loss.  HENT: Negative.  Negative for congestion, ear discharge and ear pain.   Eyes:  Negative for pain, discharge and redness.  Respiratory:  Negative for cough, sputum production, shortness of breath and wheezing.   Cardiovascular: Negative.  Negative for chest pain and palpitations.  Gastrointestinal:  Negative for abdominal pain, constipation, diarrhea, heartburn, nausea and vomiting.  Skin:  Positive for itching and rash.  Neurological:  Negative for dizziness and headaches.  Endo/Heme/Allergies:  Negative for environmental allergies. Does not bruise/bleed easily.       Objective:   Blood pressure 128/80, pulse (!) 102, temperature 98.3 F (36.8 C), temperature source Temporal, resp. rate 16, height '5\' 9"'$  (1.753 m), weight 145 lb 1.6 oz (65.8 kg), SpO2 97 %. Body mass index is 21.43 kg/m.     Physical Exam Constitutional:      Appearance: She is well-developed.  HENT:     Head: Normocephalic and atraumatic.     Right Ear: Tympanic membrane, ear canal and external ear normal. No drainage, swelling or tenderness. Tympanic membrane is not injected, scarred, erythematous, retracted or bulging.     Left Ear: Tympanic membrane, ear canal and external ear normal. No drainage, swelling or tenderness. Tympanic membrane is not injected, scarred, erythematous, retracted or  bulging.     Nose: No nasal deformity, septal deviation, mucosal edema or rhinorrhea.     Right Sinus: No maxillary sinus tenderness or frontal sinus tenderness.     Left Sinus: No maxillary sinus tenderness or frontal sinus tenderness.     Mouth/Throat:     Mouth: Mucous membranes are not pale and not dry.     Pharynx: Uvula midline.  Eyes:     General:  Right eye: No discharge.        Left eye: No discharge.     Conjunctiva/sclera: Conjunctivae normal.     Right eye: Right conjunctiva is not injected. No chemosis.    Left eye: Left conjunctiva is not injected. No chemosis.    Pupils: Pupils are equal, round, and reactive to light.  Cardiovascular:     Rate and Rhythm: Normal rate and regular rhythm.     Heart sounds: Normal heart sounds.  Pulmonary:     Effort: Pulmonary effort is normal. No tachypnea, accessory muscle usage or respiratory distress.     Breath sounds: Normal breath sounds. No wheezing, rhonchi or rales.  Chest:     Chest wall: No tenderness.  Abdominal:     Tenderness: There is no abdominal tenderness. There is no guarding or rebound.  Lymphadenopathy:     Head:     Right side of head: No submandibular, tonsillar or occipital adenopathy.     Left side of head: No submandibular, tonsillar or occipital adenopathy.     Cervical: No cervical adenopathy.  Skin:    Coloration: Skin is not pale.     Findings: Rash present. No abrasion, erythema or petechiae. Rash is crusting, papular, purpuric and scaling. Rash is not urticarial or vesicular.     Comments: Multiple macerated rashes over her extensor surfaces.  On the lower extremities, it encompasses nearly her entire shin.  There is a silvery scale on the bilateral lower extremities.  See pictures below.  Neurological:     Mental Status: She is alert.        Diagnostic studies: labs sent instead         Salvatore Marvel, MD Allergy and [place] [facility] [place]

## 2022-06-27 ENCOUNTER — Encounter: Payer: Self-pay | Admitting: Allergy & Immunology

## 2022-06-27 ENCOUNTER — Other Ambulatory Visit: Payer: Self-pay

## 2022-06-27 ENCOUNTER — Ambulatory Visit (INDEPENDENT_AMBULATORY_CARE_PROVIDER_SITE_OTHER): Payer: BLUE CROSS/BLUE SHIELD | Admitting: Allergy & Immunology

## 2022-06-27 VITALS — BP 148/82 | HR 92 | Temp 98.5°F | Resp 18 | Ht 69.0 in | Wt 143.2 lb

## 2022-06-27 DIAGNOSIS — R21 Rash and other nonspecific skin eruption: Secondary | ICD-10-CM | POA: Diagnosis not present

## 2022-06-27 DIAGNOSIS — L299 Pruritus, unspecified: Secondary | ICD-10-CM

## 2022-06-27 DIAGNOSIS — R768 Other specified abnormal immunological findings in serum: Secondary | ICD-10-CM | POA: Diagnosis not present

## 2022-06-27 MED ORDER — CETIRIZINE HCL 10 MG PO TABS: 10.0000 mg | ORAL_TABLET | Freq: Two times a day (BID) | ORAL | 5 refills | Status: DC

## 2022-06-27 MED ORDER — HYDROXYZINE HCL 25 MG PO TABS: 25.0000 mg | ORAL_TABLET | Freq: Four times a day (QID) | ORAL | 3 refills | Status: AC | PRN

## 2022-06-27 NOTE — Patient Instructions (Addendum)
1. Rash - We are going to send you to see the new [facility] Dermatologist.  - Continue with hydroxyzine 25 mg as needed. - Continue with cetirizine '10mg'$  twice daily to stay ahead of the itching. - Continue with moisturizing.  - Continue with triamcinolone twice daily to the areas. - We are going to get a lot of labs to rule out weird causes of rashes/itching. - We will call you in 1-2 weeks with the results of the testing.   2. Return in about 3 months (around 09/25/2022).    Please inform us of any Emergency Department visits, hospitalizations, or changes in symptoms. Call us before going to the ED for breathing or allergy symptoms since we might be able to fit you in for a sick visit. Feel free to contact us anytime with any questions, problems, or concerns.  It was a pleasure to see you today!  Websites that have reliable patient information: 1. American Academy of Asthma, Allergy, and Immunology: www.aaaai.org 2. Food Allergy Research and Education (FARE): foodallergy.org 3. Mothers of Asthmatics: http://www.asthmacommunitynetwork.org 4. American College of Allergy, Asthma, and Immunology: www.acaai.org   COVID-19 Vaccine Information can be found at: ShippingScam.co.uk For questions related to vaccine distribution or appointments, please email [email or web address] or call [phone].   We realize that you might be concerned about having an allergic reaction to the COVID19 vaccines. To help with that concern, WE ARE OFFERING THE COVID19 VACCINES IN OUR OFFICE! Ask the front desk for dates!     "Like" [place] on Facebook and Instagram for our latest updates!      A healthy democracy works best when [employer] participate! Make sure you are registered to vote! If you have moved or changed any of your contact information, you will need to get this updated before voting!  In some cases, you MAY be able to register to vote online:  [email or web address]

## 2022-06-27 NOTE — Progress Notes (Signed)
FOLLOW UP  Date of Service/Encounter:  06/27/22   Assessment:   Rash - ? psoriasis   Pruritus   Plan/Recommendations:   1. Rash - We are going to send you to see the new [facility] D[facility]atologist.  - Continue with hydroxyzine 25 mg as needed. - Continue with cetirizine '10mg'$  twice daily to stay ahead of the itching. - Continue with moisturizing.  - Continue with triamcinolone twice daily to the areas. - We are going to get a lot of labs to rule out weird causes of rashes/itching. - We will call you in 1-2 weeks with the results of the testing.   2. Return in about 3 months (around 09/25/2022).    Subjective:   Christine [name] is a 52 y.o. female presenting today for follow up of  Chief Complaint  Patient presents with   RASH   Follow-up    Christine [name] has a history of the following: Patient Active Problem List   Diagnosis Date Noted   Skin lesions 02/28/2022   Dermatitis 11/01/2021   Low back pain 07/31/2021   Hypertension associated with diabetes ([place])    Bacteremia, escherichia coli    Leukocytosis    Type 2 diabetes mellitus without complication, without long-term current use of insulin ([place])    Pyelonephritis 10/22/2020   Hyperkalemia 02/09/2020   Underweight 12/14/2019   Abnormal ankle brachial index (ABI) 10/06/2019   Shortness of breath 07/15/2019   Tachycardia 07/15/2019   Long term (current) use of systemic steroids 05/12/2019   Esophageal candidiasis ([place]) 03/12/2019   Fatigue 12/14/2018   Chronic cough 07/28/2018   Alcohol use 03/30/2018   Abnormal weight loss 01/13/2017   Chronic contact dermatitis 11/19/2016   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  Christine [name] is a 52 y.o. female presenting for a follow up visit. She was last seen in January 2024. At that time, I did not feel that her rash looked consistent with AD, but rather looked more consistent with psoriasis. We ordered some labs which unfortunately were never collected. We continued with hydroxyzine daily and added on cetirizine BID. We added on TAC ointment as well. We did discuss starting Dupixent for itch control.   Since the last visit, she has not done well. She continues to have the rash and it has become more hard and smooth today. It is certainly not roughened like it was when I first met her. She remains interested in seeing another Dermatologist for evaluation of her rash. She has had a biopsy with resulting unexciting pathology report (see the first visit for the full details - drug rxn versus arthropod bite.   Skin Symptom History: She remains on the hydroxyzine '25mg'$  as needed. She does need a refill for this. She is still having a lot of itching. She is also on cetirizine '10mg'$  twice daily.   Otherwise, there have been no changes to her past medical history, surgical history, family history, or social history.    Review of Systems  Constitutional: Negative.  Negative for chills, fever, malaise/fatigue and weight loss.  HENT: Negative.  Negative for congestion, ear discharge and ear pain.   Eyes:  Negative for pain, discharge and redness.  Respiratory:  Negative for cough, sputum production, shortness of breath and wheezing.   Cardiovascular: Negative.  Negative for chest pain and palpitations.  Gastrointestinal:  Negative for abdominal pain, constipation, diarrhea, heartburn, nausea and vomiting.  Skin:  Positive for itching and rash.  Neurological:  Negative for dizziness and headaches.   Endo/Heme/Allergies:  Negative for environmental allergies. Does not bruise/bleed easily.       Objective:   Blood pressure (!) 148/82, pulse 92, temperature 98.5 F (36.9 C), temperature source Temporal, resp. rate 18, height '5\' 9"'$  (1.753 m), weight 143 lb 3.2 oz (65 kg), SpO2 100 %. Body mass index is 21.15 kg/m.    Physical Exam Constitutional:      Appearance: She is well-developed.  HENT:     Head: Normocephalic and atraumatic.     Right Ear: Tympanic membrane, ear canal and external ear normal. No drainage, swelling or tenderness. Tympanic membrane is not injected, scarred, erythematous, retracted or bulging.     Left Ear: Tympanic membrane, ear canal and external ear normal. No drainage, swelling or tenderness. Tympanic membrane is not injected, scarred, erythematous, retracted or bulging.     Nose: No nasal deformity, septal deviation, mucosal edema or rhinorrhea.     Right Sinus: No maxillary sinus tenderness or frontal sinus tenderness.     Left Sinus: No maxillary sinus tenderness or frontal sinus tenderness.     Mouth/Throat:     Mouth: Mucous membranes are not pale and not dry.     Pharynx: Uvula midline.  Eyes:     General:        Right eye: No discharge.        Left eye: No discharge.     Conjunctiva/sclera: Conjunctivae normal.     Right eye: Right conjunctiva is not injected. No chemosis.    Left eye: Left conjunctiva is not injected. No chemosis.    Pupils: Pupils are equal, round, and reactive to light.  Cardiovascular:     Rate and Rhythm: Normal rate and regular rhythm.     Heart sounds: Normal heart sounds.  Pulmonary:     Effort: Pulmonary effort is normal. No tachypnea, accessory muscle usage or respiratory distress.     Breath sounds: Normal breath sounds. No wheezing, rhonchi or rales.  Chest:     Chest wall: No tenderness.  Abdominal:     Tenderness: There is no abdominal tenderness. There is no guarding or rebound.  Lymphadenopathy:      Head:     Right side of head: No submandibular, tonsillar or occipital adenopathy.     Left side of head: No submandibular, tonsillar or occipital adenopathy.     Cervical: No cervical adenopathy.  Skin:    Coloration: Skin is not pale.     Findings: Rash present. No abrasion, erythema or petechiae. Rash is crusting, papular, purpuric and scaling. Rash is not urticarial or vesicular.     Comments: Multiple macerated rashes over her extensor surfaces.  On the lower extremities, it encompasses nearly her entire shin.  There is no oozing at all.   Neurological:     Mental Status: She is alert.      Diagnostic studies: labs sent instead        Salvatore Marvel, MD  Allergy and [place] [facility] [place]

## 2022-07-01 ENCOUNTER — Other Ambulatory Visit: Payer: Self-pay

## 2022-07-01 LAB — ALPHA-GAL PANEL
Allergen Lamb IgE: 0.16 kU/L — AB
Beef IgE: 0.16 kU/L — AB
IgE (Immunoglobulin E), Serum: 1550 IU/mL — ABNORMAL HIGH (ref 6–495)
O215-IgE Alpha-Gal: 0.15 kU/L — AB
Pork IgE: 0.22 kU/L — AB

## 2022-07-01 LAB — ALLERGENS W/COMP RFLX AREA 2

## 2022-07-01 MED ORDER — GABAPENTIN 300 MG PO CAPS: ORAL_CAPSULE | ORAL | 1 refills | Status: DC

## 2022-07-01 NOTE — Telephone Encounter (Signed)
Per fax received from pharmacy.   Last Rx written 03/20/2021 Last fill date 02/20/2022  Please send Rx if appropriate, Christine [name], [place]

## 2022-07-03 NOTE — Addendum Note (Signed)
Addended by: Valentina Shaggy on: 07/03/2022 09:18 AM   Modules accepted: Orders

## 2022-07-05 LAB — ALLERGENS W/COMP RFLX AREA 2
Alternaria Alternata IgE: 0.28 kU/L — AB
Aspergillus Fumigatus IgE: 0.3 kU/L — AB
Bermuda Grass IgE: 1.79 kU/L — AB
Cedar, Mountain IgE: 1.53 kU/L — AB
Cladosporium Herbarum IgE: 0.28 kU/L — AB
Cockroach, German IgE: 9.1 kU/L — AB
Common Silver Birch IgE: 1.74 kU/L — AB
Cottonwood IgE: 1.81 kU/L — AB
D Farinae IgE: 1.2 kU/L — AB
D Pteronyssinus IgE: 0.39 kU/L — AB
E001-IgE Cat Dander: 0.21 kU/L — AB
E005-IgE Dog Dander: 0.76 kU/L — AB
Elm, American IgE: 1.9 kU/L — AB
IgE (Immunoglobulin E), Serum: 1611 IU/mL — ABNORMAL HIGH (ref 6–495)
Johnson Grass IgE: 1.74 kU/L — AB
Maple/Box Elder IgE: 1.84 kU/L — AB
Mouse Urine IgE: 0.29 kU/L — AB
Oak, White IgE: 1.46 kU/L — AB
Pecan, Hickory IgE: 1.61 kU/L — AB
Penicillium Chrysogen IgE: 0.22 kU/L — AB
Pigweed, Rough IgE: 1.81 kU/L — AB
Ragweed, Short IgE: 1.83 kU/L — AB
Sheep Sorrel IgE Qn: 1.72 kU/L — AB
Timothy Grass IgE: 1.69 kU/L — AB
White Mulberry IgE: 1.44 kU/L — AB

## 2022-07-05 LAB — CMP14+EGFR
ALT: 9 IU/L (ref 0–32)
AST: 15 IU/L (ref 0–40)
Albumin/Globulin Ratio: 1 — ABNORMAL LOW (ref 1.2–2.2)
Albumin: 3.4 g/dL — ABNORMAL LOW (ref 3.8–4.9)
Alkaline Phosphatase: 258 IU/L — ABNORMAL HIGH (ref 44–121)
BUN/Creatinine Ratio: 9 (ref 9–23)
BUN: 12 mg/dL (ref 6–24)
Bilirubin Total: <0.2 mg/dL (ref 0.0–1.2)
CO2: 16 mmol/L — ABNORMAL LOW (ref 20–29)
Calcium: 9 mg/dL (ref 8.7–10.2)
Chloride: 110 mmol/L — ABNORMAL HIGH (ref 96–106)
Creatinine, Ser: 1.38 mg/dL — ABNORMAL HIGH (ref 0.57–1.00)
Globulin, Total: 3.3 g/dL (ref 1.5–4.5)
Glucose: 87 mg/dL (ref 70–99)
Potassium: 4.8 mmol/L (ref 3.5–5.2)
Sodium: 140 mmol/L (ref 134–144)
Total Protein: 6.7 g/dL (ref 6.0–8.5)
eGFR: 46 mL/min/{1.73_m2} — ABNORMAL LOW (ref >59)

## 2022-07-05 LAB — THYROID ANTIBODIES
Thyroglobulin Antibody: <1 IU/mL (ref 0.0–0.9)
Thyroperoxidase Ab SerPl-aCnc: <9 IU/mL (ref 0–34)

## 2022-07-05 LAB — CHRONIC URTICARIA: cu index: <2.6 (ref <10)

## 2022-07-05 LAB — CBC WITH DIFFERENTIAL
Basophils Absolute: 0 10*3/uL (ref 0.0–0.2)
Basos: 1 %
EOS (ABSOLUTE): 0.2 10*3/uL (ref 0.0–0.4)
Eos: 4 %
Hematocrit: 29.6 % — ABNORMAL LOW (ref 34.0–46.6)
Hemoglobin: 9.2 g/dL — ABNORMAL LOW (ref 11.1–15.9)
Immature Grans (Abs): 0 10*3/uL (ref 0.0–0.1)
Immature Granulocytes: 0 %
Lymphocytes Absolute: 1.1 10*3/uL (ref 0.7–3.1)
Lymphs: 20 %
MCH: 29.5 pg (ref 26.6–33.0)
MCHC: 31.1 g/dL — ABNORMAL LOW (ref 31.5–35.7)
MCV: 95 fL (ref 79–97)
Monocytes Absolute: 0.5 10*3/uL (ref 0.1–0.9)
Monocytes: 8 %
Neutrophils Absolute: 3.6 10*3/uL (ref 1.4–7.0)
Neutrophils: 67 %
RBC: 3.12 x10E6/uL — ABNORMAL LOW (ref 3.77–5.28)
RDW: 14.2 % (ref 11.7–15.4)
WBC: 5.5 10*3/uL (ref 3.4–10.8)

## 2022-07-05 LAB — ALLERGEN COMPONENT COMMENTS

## 2022-07-05 LAB — PANEL 606648
E101-IgE Can f 1: 0.29 kU/L — AB
E102-IgE Can f 2: 0.24 kU/L — AB
E221-IgE Can f 3: 0.23 kU/L — AB
E226-IgE Can f 5: 0.32 kU/L — AB

## 2022-07-05 LAB — SEDIMENTATION RATE: Sed Rate: 99 mm/hr — ABNORMAL HIGH (ref 0–40)

## 2022-07-05 LAB — TRYPTASE: Tryptase: 6.8 ug/L (ref 2.2–13.2)

## 2022-07-05 LAB — C-REACTIVE PROTEIN: CRP: 30 mg/L — ABNORMAL HIGH (ref 0–10)

## 2022-07-05 LAB — ANTINUCLEAR ANTIBODIES, IFA: ANA Titer 1: NEGATIVE

## 2022-07-05 MED ORDER — GABAPENTIN 300 MG PO CAPS: ORAL_CAPSULE | ORAL | 1 refills | Status: DC

## 2022-07-05 NOTE — Addendum Note (Signed)
Addended by: Christen Bame D on: 07/05/2022 04:21 PM   Modules accepted: Orders

## 2022-07-05 NOTE — Telephone Encounter (Signed)
Received request from pharmacy again.  Upon review medication was set to print instead of sending electronically.  Resent electronically. Christen Bame, CMA

## 2022-07-08 ENCOUNTER — Other Ambulatory Visit: Payer: Self-pay | Admitting: Vascular Surgery

## 2022-07-08 ENCOUNTER — Other Ambulatory Visit: Payer: Self-pay | Admitting: Family Medicine

## 2022-07-08 DIAGNOSIS — L309 Dermatitis, unspecified: Secondary | ICD-10-CM

## 2022-07-09 ENCOUNTER — Ambulatory Visit (INDEPENDENT_AMBULATORY_CARE_PROVIDER_SITE_OTHER): Payer: 59 | Admitting: Student

## 2022-07-09 VITALS — BP 172/91 | HR 88 | Ht 69.0 in | Wt 137.2 lb

## 2022-07-09 DIAGNOSIS — L989 Disorder of the skin and subcutaneous tissue, unspecified: Secondary | ICD-10-CM | POA: Diagnosis not present

## 2022-07-09 MED ORDER — TRIAMCINOLONE ACETONIDE 0.5 % EX OINT: 1.0000 | TOPICAL_OINTMENT | Freq: Two times a day (BID) | CUTANEOUS | 0 refills | Status: DC

## 2022-07-09 NOTE — Progress Notes (Signed)
    SUBJECTIVE:   CHIEF COMPLAINT / HPI: Rash  Ongoing rash:  Has had rash involving extremities and torso for many years. Was previously on oral steroids and after stopping the rash came back and has stayed like this. For past 1-2 months has been feeling worse. Hurts to stand at work. Has had some draining lesions. No fevers, no vomiting. Sensation of rash is itchy and painful. Has been using triamcinolone 0.1% BID and vaseline. No dramatic weight loss.  Seen in derm clinic 11/2-biopsy showed possible dermal hypersensitivity reaction vs exogenous stimuli (arthropod bite or endogenous stimuli (drug). Was referred to allergy - possible psoriasis - they have continued hydorxyzine and cetirizine, triamcinolone. Obtained many allergen labs. They have placed a dermatology referral to [facility].  Per allergy medicine MD regarding labs: "CRP (30) and ESR (99) were very elevated.  Thankfully, the tryptase which is a marker of mast cell disease was normal.  An alpha gal panel was slightly elevated, but I think this was just because of your elevated total IgE. Likewise, the entire environmental allergy panel was elevated, but again for each allergen, it was fairly low aside from cockroach.  I think this is a lot of nonspecific finding from your high total IgE.  I do not know why your IgE is so elevated, but we are going to send a serum protein electrophoresis or an SPEP to make sure there is no weird blood cancer, which can present with this. We just want to make sure we are ruling out everything."   PERTINENT  PMH / PSH: tobacco smoker  OBJECTIVE:   BP (!) 172/91   Pulse 88   Ht 5\' 9"  (1.753 m)   Wt 137 lb 4 oz (62.3 kg)   SpO2 99%   BMI 20.27 kg/m   General: NAD, awake, alert, responsive to questions Skin: Scaly dry hyperpigmented patches along all extremities, torso and back, no oral lesions, some lesions on palm            ASSESSMENT/PLAN:   Skin lesions Possible  psoriasis. Allergy medicine is working up with SPEP as patient had very high inflammatory markers. Dermatology referral in place-provided patient number to contact them. Suspect she may need biologics for management. Will reach out to allergy medicine about potential oral steroids in meantime although want to be cautious with concern for potential cancerous etiology. For now, will increase topical steroid to 0.5% triamcinolone BID and frequent emmollient use. No infected appearance to lesions currently.  -Will reach out to allergy medicine about dermatology referral and potential oral steroid use -triamcinolone to 0.5% BID -derm office number provided on AVS to set this up   Gerrit Heck, [place]

## 2022-07-09 NOTE — Assessment & Plan Note (Signed)
Possible psoriasis. Allergy medicine is working up with [facility] as patient had very high inflammatory markers. Dermatology referral in place-provided patient number to contact them. Suspect she may need biologics for management. Will reach out to allergy medicine about potential oral steroids in meantime although want to be cautious with concern for potential cancerous etiology. For now, will increase topical steroid to 0.5% triamcinolone BID and frequent emmollient use. No infected appearance to lesions currently.  -Will reach out to allergy medicine about dermatology referral and potential oral steroid use -triamcinolone to 0.5% BID -derm office number provided on AVS to set this up

## 2022-07-09 NOTE — Patient Instructions (Addendum)
It was great to see you! Thank you for allowing me to participate in your care!   Our plans for today:  - I am sending in a higher dose of steroid cream -I am going to send a message to your allergist as well if there is anything else we can try -The derm referral is to [phone]-please call to see if they can schedule you  Take care and seek immediate care sooner if you develop any concerns.  Gerrit Heck, MD

## 2022-07-10 LAB — IRON,TIBC AND FERRITIN PANEL
Ferritin: 85 ng/mL (ref 15–150)
Iron Saturation: 16 % (ref 15–55)
Iron: 48 ug/dL (ref 27–159)
Total Iron Binding Capacity: 292 ug/dL (ref 250–450)
UIBC: 244 ug/dL (ref 131–425)

## 2022-07-10 LAB — PROTEIN ELECTROPHORESIS, SERUM, WITH REFLEX
A/G Ratio: 0.8 (ref 0.7–1.7)
Albumin ELP: 3.1 g/dL (ref 2.9–4.4)
Alpha 1: 0.4 g/dL (ref 0.0–0.4)
Alpha 2: 0.8 g/dL (ref 0.4–1.0)
Beta: 1.5 g/dL — ABNORMAL HIGH (ref 0.7–1.3)
Gamma Globulin: 1 g/dL (ref 0.4–1.8)
Globulin, Total: 3.7 g/dL (ref 2.2–3.9)
Total Protein: 6.8 g/dL (ref 6.0–8.5)

## 2022-07-10 LAB — IGG, IGA, IGM
IgA/Immunoglobulin A, Serum: 724 mg/dL — ABNORMAL HIGH (ref 87–352)
IgG (Immunoglobin G), Serum: 1166 mg/dL (ref 586–1602)
IgM (Immunoglobulin M), Srm: 13 mg/dL — ABNORMAL LOW (ref 26–217)

## 2022-07-10 LAB — LACTATE DEHYDROGENASE: LDH: 180 IU/L (ref 119–226)

## 2022-07-11 ENCOUNTER — Other Ambulatory Visit: Payer: Self-pay

## 2022-07-11 MED ORDER — METFORMIN HCL ER 500 MG PO TB24: 500.0000 mg | ORAL_TABLET | Freq: Two times a day (BID) | ORAL | 2 refills | Status: DC

## 2022-07-12 ENCOUNTER — Telehealth: Payer: Self-pay | Admitting: Allergy & Immunology

## 2022-07-12 DIAGNOSIS — R778 Other specified abnormalities of plasma proteins: Secondary | ICD-10-CM

## 2022-07-12 DIAGNOSIS — D649 Anemia, unspecified: Secondary | ICD-10-CM

## 2022-07-12 NOTE — Telephone Encounter (Signed)
Patient called and stated that she would like a call back to go over her results. Patients call back number is [phone]

## 2022-07-12 NOTE — Telephone Encounter (Signed)
Spoke with patient, informed her that her labs have come back however the provider has not resulted them yet. I informed patient that I would send a message to the provider for him to result them and then we would give her a call back once he has. Patient verbalized understanding.

## 2022-07-15 ENCOUNTER — Telehealth: Payer: Self-pay | Admitting: Allergy & Immunology

## 2022-07-15 ENCOUNTER — Telehealth: Payer: Self-pay | Admitting: Internal Medicine

## 2022-07-15 NOTE — Telephone Encounter (Signed)
Patient would like a call back about her lab results whenever possible.

## 2022-07-15 NOTE — Telephone Encounter (Signed)
Patient called back in - DOB verified - advised provider has sent her a mychart message regarding her lab results.  Patient verbalized understanding, no further questions

## 2022-07-15 NOTE — Telephone Encounter (Signed)
scheduled per 3/15 referral, pt has been called and confirmed date and time. Pt is aware of location and to arrive early for check in

## 2022-07-17 ENCOUNTER — Other Ambulatory Visit: Payer: Self-pay | Admitting: Allergy & Immunology

## 2022-07-17 NOTE — Telephone Encounter (Signed)
Spoke to patient and she stated the medication (Hydroxyzine) isn't working at all, it seems to make it the itching worse. She also states her legs are also swelling pretty bad. The left leg stays swollen and hurts when she walks, the right leg swelling comes and goes.  Christine [name] [id number]

## 2022-07-17 NOTE — Addendum Note (Signed)
Addended by: Eloy End D on: 07/17/2022 05:36 PM   Modules accepted: Orders

## 2022-07-17 NOTE — Telephone Encounter (Signed)
Thank you :)

## 2022-07-17 NOTE — Telephone Encounter (Signed)
Patient called stating she was given a tablet and cream for itching on her last visit neither is working patient has swelling in her feet and Constant itching asking for a call from the nurse for these issues please advise

## 2022-07-17 NOTE — Telephone Encounter (Signed)
Let's change things up:  MORNING: Allegra two tablets + Pepcid 40mg    NOON: Singulair 10mg  + Xyzal two tablets  NIGHT: Allegra two tablets + Pepcid 40mg    I pended all of the scripts. Please confirm with the patient where they should be sent.   Salvatore Marvel, MD [facility]

## 2022-07-17 NOTE — Addendum Note (Signed)
Addended by: Valentina Shaggy on: 07/17/2022 05:06 PM   Modules accepted: Orders

## 2022-07-17 NOTE — Telephone Encounter (Addendum)
Spoke to patient and gave her the instructions Dr. Ernst Bowler provided. Patient confirmed pharmacy. She wanted to know if prednisone was going to be sent into the pharmacy. Advised patient at this time the doctor did not suggest it. Patient stated that she hopes that it gets sent in soon because she really needs it.    System will not let me complete order. It has given me a hard stop and will not let me override it due to the high dosage.

## 2022-07-18 ENCOUNTER — Telehealth: Payer: Self-pay | Admitting: Allergy & Immunology

## 2022-07-18 NOTE — Telephone Encounter (Signed)
I spoke with the patient and it was in regards to the prednisone being sent in and an ointment for the rash. She states that her feet are very swollen and her rash is very itchy and she is miserable. I saw in a refill encounter that Christine [name] was not able to order the prednisone because of the amount. Please advise as soon as possible so that we can get relief for the patient.

## 2022-07-18 NOTE — Telephone Encounter (Signed)
Patient is requesting a call back about medications.

## 2022-07-19 ENCOUNTER — Other Ambulatory Visit: Payer: Self-pay | Admitting: Allergy & Immunology

## 2022-07-19 MED ORDER — TRIAMCINOLONE ACETONIDE 0.1 % EX OINT: 1.0000 | TOPICAL_OINTMENT | Freq: Two times a day (BID) | CUTANEOUS | 0 refills | Status: DC | PRN

## 2022-07-19 MED ORDER — PREDNISONE 10 MG PO TABS: ORAL_TABLET | ORAL | 0 refills | Status: DC

## 2022-07-19 NOTE — Telephone Encounter (Signed)
Called and spoke with the patient and advised of medications being sent in, I did advise that if she needs anything for nights or weekends to please call us. Patient verbalized understanding.

## 2022-07-19 NOTE — Telephone Encounter (Signed)
I do not see any of this on my end at all. I reviewed the telephone notes and whatnot. I can send in a prednisone taper and a topical steroid.

## 2022-07-22 MED ORDER — MONTELUKAST SODIUM 10 MG PO TABS: 10.0000 mg | ORAL_TABLET | Freq: Every day | ORAL | 0 refills | Status: DC

## 2022-07-22 MED ORDER — FAMOTIDINE 40 MG PO TABS: 40.0000 mg | ORAL_TABLET | Freq: Two times a day (BID) | ORAL | 0 refills | Status: DC

## 2022-07-22 MED ORDER — FEXOFENADINE HCL 180 MG PO TABS: 360.0000 mg | ORAL_TABLET | Freq: Two times a day (BID) | ORAL | 0 refills | Status: DC

## 2022-07-22 MED ORDER — LEVOCETIRIZINE DIHYDROCHLORIDE 5 MG PO TABS: 10.0000 mg | ORAL_TABLET | Freq: Every day | ORAL | 0 refills | Status: DC

## 2022-08-02 ENCOUNTER — Other Ambulatory Visit: Payer: Self-pay

## 2022-08-02 DIAGNOSIS — D649 Anemia, unspecified: Secondary | ICD-10-CM

## 2022-08-05 ENCOUNTER — Inpatient Hospital Stay: Payer: 59 | Attending: Internal Medicine | Admitting: Internal Medicine

## 2022-08-05 ENCOUNTER — Inpatient Hospital Stay: Payer: 59

## 2022-08-05 ENCOUNTER — Other Ambulatory Visit: Payer: Self-pay

## 2022-08-05 ENCOUNTER — Encounter: Payer: Self-pay | Admitting: Internal Medicine

## 2022-08-05 VITALS — BP 144/83 | HR 92 | Temp 98.2°F | Resp 16 | Ht 69.0 in | Wt 137.2 lb

## 2022-08-05 DIAGNOSIS — D649 Anemia, unspecified: Secondary | ICD-10-CM | POA: Diagnosis not present

## 2022-08-05 DIAGNOSIS — N189 Chronic kidney disease, unspecified: Secondary | ICD-10-CM | POA: Diagnosis not present

## 2022-08-05 DIAGNOSIS — D539 Nutritional anemia, unspecified: Secondary | ICD-10-CM

## 2022-08-05 DIAGNOSIS — F17210 Nicotine dependence, cigarettes, uncomplicated: Secondary | ICD-10-CM | POA: Diagnosis not present

## 2022-08-05 LAB — COMPREHENSIVE METABOLIC PANEL
ALT: 11 U/L (ref 0–44)
AST: 16 U/L (ref 15–41)
Albumin: 3.6 g/dL (ref 3.5–5.0)
Alkaline Phosphatase: 171 U/L — ABNORMAL HIGH (ref 38–126)
Anion gap: 9 (ref 5–15)
BUN: 12 mg/dL (ref 6–20)
CO2: 23 mmol/L (ref 22–32)
Calcium: 9.2 mg/dL (ref 8.9–10.3)
Chloride: 105 mmol/L (ref 98–111)
Creatinine, Ser: 1.34 mg/dL — ABNORMAL HIGH (ref 0.44–1.00)
GFR, Estimated: 48 mL/min — ABNORMAL LOW (ref >60)
Glucose, Bld: 143 mg/dL — ABNORMAL HIGH (ref 70–99)
Potassium: 4.3 mmol/L (ref 3.5–5.1)
Sodium: 137 mmol/L (ref 135–145)
Total Bilirubin: 0.4 mg/dL (ref 0.3–1.2)
Total Protein: 7.3 g/dL (ref 6.5–8.1)

## 2022-08-05 LAB — CBC WITH DIFFERENTIAL/PLATELET
Abs Immature Granulocytes: 0.03 10*3/uL (ref 0.00–0.07)
Basophils Absolute: 0 10*3/uL (ref 0.0–0.1)
Basophils Relative: 0 %
Eosinophils Absolute: 0.4 10*3/uL (ref 0.0–0.5)
Eosinophils Relative: 4 %
HCT: 30.4 % — ABNORMAL LOW (ref 36.0–46.0)
Hemoglobin: 10.3 g/dL — ABNORMAL LOW (ref 12.0–15.0)
Immature Granulocytes: 0 %
Lymphocytes Relative: 13 %
Lymphs Abs: 1.1 10*3/uL (ref 0.7–4.0)
MCH: 31 pg (ref 26.0–34.0)
MCHC: 33.9 g/dL (ref 30.0–36.0)
MCV: 91.6 fL (ref 80.0–100.0)
Monocytes Absolute: 0.6 10*3/uL (ref 0.1–1.0)
Monocytes Relative: 7 %
Neutro Abs: 6.5 10*3/uL (ref 1.7–7.7)
Neutrophils Relative %: 76 %
Platelets: 325 10*3/uL (ref 150–400)
RBC: 3.32 MIL/uL — ABNORMAL LOW (ref 3.87–5.11)
RDW: 17.2 % — ABNORMAL HIGH (ref 11.5–15.5)
WBC: 8.5 10*3/uL (ref 4.0–10.5)
nRBC: 0 % (ref 0.0–0.2)

## 2022-08-05 LAB — VITAMIN B12: Vitamin B-12: 148 pg/mL — ABNORMAL LOW (ref 180–914)

## 2022-08-05 LAB — IRON AND IRON BINDING CAPACITY (CC-WL,HP ONLY)
Iron: 42 ug/dL (ref 28–170)
Saturation Ratios: 12 % (ref 10.4–31.8)
TIBC: 346 ug/dL (ref 250–450)
UIBC: 304 ug/dL (ref 148–442)

## 2022-08-05 LAB — FOLATE: Folate: 27.1 ng/mL (ref >5.9)

## 2022-08-05 LAB — FERRITIN: Ferritin: 69 ng/mL (ref 11–307)

## 2022-08-05 NOTE — Progress Notes (Signed)
[facility] [phone]   [phone]) [phone]  CONSULT NOTE  REFERRING PHYSICIAN: Dr. Malachi BondsJoel [name]  REASON FOR CONSULTATION:  52 years old African-American female with persistent anemia  HPI Christine [name] is a 52 y.o. female with past medical history significant for angioedema, cellulitis of the right arm, chronic pancreatitis, hypertension, diabetes mellitus as well as history of substance abuse.  The patient was seen by her primary care physician for routine evaluation and she was found to have persistent rash all over her upper extremities as well as the back.  She was referred to Dr. Dellis [name] for evaluation of her condition and during routine blood work her CBC showed persistent anemia with hemoglobin of 9.2 and hematocrit 29.6%.  Her iron study showed normal serum iron of 48 with iron saturation of 16% and ferritin level of 85.  She was referred to me today for evaluation and recommendation regarding her condition. When seen today the patient is feeling fine except for fatigue.  She also has some occasional lightheadedness and diarrhea but no craving for ice.  She never had a colonoscopy or any gastrointestinal blood work.  She has no chest pain but has shortness of breath with exertion with mild cough and no hemoptysis.  She noticed blood in her stool several times likely secondary to hemorrhoids.  She has no nausea, vomiting, diarrhea or constipation.  She has no headache or visual changes. Family history significant for mother died from alcoholic liver cirrhosis.  She does not know her father's history and she had a brother who had cancer. The patient is single and has 3 children.  She currently works in a [employer].  She has a history of smoking 0.5-1 pack/day she also drinks beer 3 cans every day.  She has a history of marijuana abuse in the past.  HPI  Past Medical History:  Diagnosis Date   Alcohol abuse    Angio-edema    Cellulitis 11/2016    RIGHT ARM   Chronic pancreatitis    Diabetes mellitus    Esophageal candidiasis 03/12/2019   Hypertension    Substance abuse     Past Surgical History:  Procedure Laterality Date   BREAST REDUCTION SURGERY      History reviewed. No pertinent family history.  Social History Social History   Tobacco Use   Smoking status: Every Day    Packs/day: 1.00    Years: 22.00    Additional pack years: 0.00    Total pack years: 22.00    Types: Cigarettes    Passive exposure: Current   Smokeless tobacco: Never   Tobacco comments:    working on quitting  Vaping Use   Vaping Use: Never used  Substance Use Topics   Alcohol use: Yes    Alcohol/week: 0.0 standard drinks of alcohol    Comment: 11/2016 " i DRINK LESS THAN i USED TO,BUT i DRINK BEER EVERYDAY   Drug use: Not Currently    Comment: last use two weeks ago    No Known Allergies  Current Outpatient Medications  Medication Sig Dispense Refill   famotidine (PEPCID) 40 MG tablet Take 1 tablet (40 mg total) by mouth 2 (two) times daily. 60 tablet 0   fexofenadine (ALLEGRA ALLERGY) 180 MG tablet Take 2 tablets (360 mg total) by mouth in the morning and at bedtime. 120 tablet 0   gabapentin (NEURONTIN) 300 MG capsule TAKE 1 CAPSULE BY MOUTH THREE TIMES A DAY Strength: 300 mg 270 capsule  1   glucose blood (ACCU-CHEK GUIDE) test strip Please use to check blood sugar up to 4 times daily. E11.9 100 each 12   hydrocortisone 2.5 % lotion Apply topically 2 (two) times daily. Place on sites of rash 2 times daily. 118 mL 0   levocetirizine (XYZAL) 5 MG tablet Take 2 tablets (10 mg total) by mouth daily. 60 tablet 0   metFORMIN (GLUCOPHAGE-XR) 500 MG 24 hr tablet Take 1 tablet (500 mg total) by mouth 2 (two) times daily with a meal. 60 tablet 2   montelukast (SINGULAIR) 10 MG tablet Take 1 tablet (10 mg total) by mouth at bedtime. 30 tablet 0   rosuvastatin (CRESTOR) 10 MG tablet Take 10 mg by mouth daily.     triamcinolone ointment (KENALOG)  0.1 % APPLY TOPICALLY 2 TIMES DAILY AS NEEDED. DO NOT USE ON THE FACE, ARM PITS, OR GROIN. 454 g 0   Accu-Chek FastClix Lancets MISC Please use to check blood sugar up to 4 times daily. E11.9 (Patient not taking: Reported on 08/05/2022) 102 each 2   blood glucose meter kit and supplies KIT Dispense based on patient and insurance preference. Use up to four times daily as directed. (Patient not taking: Reported on 08/05/2022) 1 each 0   cetirizine (ZYRTEC) 10 MG tablet Take 1 tablet (10 mg total) by mouth 2 (two) times daily. 60 tablet 5   hydrOXYzine (ATARAX) 25 MG tablet Take 1 tablet (25 mg total) by mouth every 6 (six) hours as needed for itching. (Patient not taking: Reported on 08/05/2022) 120 tablet 3   No current facility-administered medications for this visit.    Review of Systems  Constitutional: positive for fatigue Eyes: negative Ears, nose, mouth, throat, and face: negative Respiratory: positive for dyspnea on exertion Cardiovascular: negative Gastrointestinal: positive for melena Genitourinary:negative Integument/breast: negative Hematologic/lymphatic: negative Musculoskeletal:negative Neurological: negative Behavioral/Psych: negative Endocrine: negative Allergic/Immunologic: negative  Physical Exam  MIW:OEHOZ, healthy, no distress, well nourished, and well developed SKIN: skin color, texture, turgor are normal, no rashes or significant lesions HEAD: Normocephalic, No masses, lesions, tenderness or abnormalities EYES: normal, PERRLA, Conjunctiva are pink and non-injected EARS: External ears normal, Canals clear OROPHARYNX:no exudate, no erythema, and lips, buccal mucosa, and tongue normal  NECK: supple, no adenopathy, no JVD LYMPH:  no palpable lymphadenopathy, no hepatosplenomegaly BREAST:not examined LUNGS: clear to auscultation , and palpation HEART: regular rate & rhythm, no murmurs, and no gallops ABDOMEN:abdomen soft, non-tender, normal bowel sounds, and no masses  or organomegaly BACK: Back symmetric, no curvature., No CVA tenderness EXTREMITIES:no joint deformities, effusion, or inflammation, no edema  NEURO: alert & oriented x 3 with fluent speech, no focal motor/sensory deficits  PERFORMANCE STATUS: ECOG 1  LABORATORY DATA: Lab Results  Component Value Date   WBC 8.5 08/05/2022   HGB 10.3 (L) 08/05/2022   HCT 30.4 (L) 08/05/2022   MCV 91.6 08/05/2022   PLT 325 08/05/2022      Chemistry      Component Value Date/Time   NA 137 08/05/2022 1056   NA 140 06/27/2022 1455   K 4.3 08/05/2022 1056   CL 105 08/05/2022 1056   CO2 23 08/05/2022 1056   BUN 12 08/05/2022 1056   BUN 12 06/27/2022 1455   CREATININE 1.34 (H) 08/05/2022 1056   CREATININE 0.88 08/31/2014 1143      Component Value Date/Time   CALCIUM 9.2 08/05/2022 1056   ALKPHOS 171 (H) 08/05/2022 1056   AST 16 08/05/2022 1056   ALT 11 08/05/2022  1056   BILITOT 0.4 08/05/2022 1056   BILITOT <0.2 06/27/2022 1455       RADIOGRAPHIC STUDIES: No results found.  ASSESSMENT: This is a 52 years old African-American female with multiple medical condition as well as mild renal insufficiency presented for evaluation of persistent anemia. This is likely anemia of chronic disease versus gastrointestinal blood loss.  PLAN: I had a lengthy discussion with the patient today about her current condition and further investigation to identify the etiology of her anemia. I ordered several studies today including repeat CBC which showed hemoglobin of 10.3 and hematocrit of 30.4%. Comprehensive metabolic panel showed elevated blood sugar of 143 and serum creatinine of 1.34.  Iron study, ferritin, vitamin B12 and serum folate are still pending. I referred the patient to gastroenterology for evaluation of the gastrointestinal blood loss as the patient noticed blood in her stool.  She never had a colonoscopy in the past. I recommended for the patient to start taking over-the-counter oral iron tablet  1 tablet p.o. daily with vitamin C or orange juice. I will see her back for follow-up visit in 3 months for evaluation and repeat blood work. She was advised to call immediately if she has any other concerning symptoms in the interval. The patient voices understanding of current disease status and treatment options and is in agreement with the current care plan.  All questions were answered. The patient knows to call the clinic with any problems, questions or concerns. We can certainly see the patient much sooner if necessary.  Thank you so much for allowing me to participate in the care of Christine [name]. I will continue to follow up the patient with you and assist in her care.  The total time spent in the appointment was 60 minutes.  Disclaimer: This note was dictated with voice recognition software. Similar sounding words can inadvertently be transcribed and may not be corrected upon review.   Lajuana Matte August 05, 2022, 11:55 AM

## 2022-08-13 ENCOUNTER — Ambulatory Visit (INDEPENDENT_AMBULATORY_CARE_PROVIDER_SITE_OTHER): Payer: 59 | Admitting: Dermatology

## 2022-08-13 ENCOUNTER — Encounter: Payer: Self-pay | Admitting: Dermatology

## 2022-08-13 VITALS — BP 169/86

## 2022-08-13 DIAGNOSIS — L409 Psoriasis, unspecified: Secondary | ICD-10-CM

## 2022-08-13 MED ORDER — CALCIPOTRIENE 0.005 % EX CREA: TOPICAL_CREAM | Freq: Every day | CUTANEOUS | 0 refills | Status: DC

## 2022-08-13 MED ORDER — CLOBETASOL PROP EMOLLIENT BASE 0.05 % EX CREA: 1.0000 | TOPICAL_CREAM | Freq: Two times a day (BID) | CUTANEOUS | 0 refills | Status: DC

## 2022-08-13 NOTE — Progress Notes (Unsigned)
   New Patient Visit   Subjective  Christine [name] is a 52 y.o. female who presents for the following: Rash and swelling on legs, arms, abdomen, back 4-5 months. She states the symptoms started on her legs 5 years ago.  Past treatment was with Prednisone orally for unknown duration. Symptoms returned when stopping Prednisone. She was also treated with Hydrocortisone 2.5% lotion, Triamcinolone 0.1% ointment. She is currently taking Hydroxyzine twice daily and is applying Triamcinolone ointment.   The following portions of the chart were reviewed this encounter and updated as appropriate: medications, allergies, medical history  Review of Systems:  No other skin or systemic complaints except as noted in HPI or Assessment and Plan.  Objective  Well appearing patient in no apparent distress; mood and affect are within normal limits.   A focused examination was performed of the following areas: Arms, legs, torso, extremities.  Relevant exam findings are noted in the Assessment and Plan.    Assessment & Plan   PSORIASIS Exam: Well-demarcated erythematous papules/plaques with silvery scale, guttate pink scaly papules. 40% BSA  PGA 3%  Flared  patient denies joint pain***  Psoriasis is a chronic non-curable, but treatable genetic/hereditary disease that may have other systemic features affecting other organ systems such as joints (Psoriatic Arthritis). It is associated with an increased risk of inflammatory bowel disease, heart disease, non-alcoholic fatty liver disease, and depression.  Treatments include light and laser treatments; topical medications; and systemic medications including oral and injectables.  Treatment Plan: Calcipotriene ointment daily x 2 weeks then Clobetasol ointment  twice daily   Advised reducing salt intake, wearing compression stockings. Elevating lower legs when possible. Discussed eventual treatment with Biologic injectable.        No follow-ups on  file.  Jaclynn Guarneri, CMA, am acting as scribe for Langston Reusing, MD.   Documentation: I have reviewed the above documentation for accuracy and completeness, and I agree with the above.  Langston Reusing, MD

## 2022-08-14 ENCOUNTER — Encounter: Payer: Self-pay | Admitting: Dermatology

## 2022-08-14 ENCOUNTER — Telehealth: Payer: Self-pay

## 2022-08-15 NOTE — Telephone Encounter (Signed)
error 

## 2022-08-19 ENCOUNTER — Other Ambulatory Visit: Payer: Self-pay | Admitting: Dermatology

## 2022-08-19 DIAGNOSIS — L409 Psoriasis, unspecified: Secondary | ICD-10-CM | POA: Diagnosis not present

## 2022-08-21 LAB — ACUTE VIRAL HEPATITIS (HAV, HBV, HCV)
HCV Ab: NONREACTIVE
Hep A IgM: NEGATIVE
Hep B C IgM: NEGATIVE
Hepatitis B Surface Ag: NEGATIVE

## 2022-08-21 LAB — QUANTIFERON-TB GOLD PLUS
QuantiFERON Mitogen Value: >10 IU/mL
QuantiFERON Nil Value: 0.12 IU/mL
QuantiFERON TB1 Ag Value: 0.12 IU/mL
QuantiFERON TB2 Ag Value: 0.1 IU/mL
QuantiFERON-TB Gold Plus: NEGATIVE

## 2022-08-21 LAB — HCV INTERPRETATION

## 2022-09-03 ENCOUNTER — Ambulatory Visit (INDEPENDENT_AMBULATORY_CARE_PROVIDER_SITE_OTHER): Payer: Medicaid Other | Admitting: Dermatology

## 2022-09-03 ENCOUNTER — Ambulatory Visit: Payer: Medicaid Other | Admitting: Dermatology

## 2022-09-03 VITALS — BP 161/98

## 2022-09-03 DIAGNOSIS — L409 Psoriasis, unspecified: Secondary | ICD-10-CM | POA: Diagnosis not present

## 2022-09-03 DIAGNOSIS — L089 Local infection of the skin and subcutaneous tissue, unspecified: Secondary | ICD-10-CM | POA: Diagnosis not present

## 2022-09-03 MED ORDER — CALCIPOTRIENE 0.005 % EX OINT: TOPICAL_OINTMENT | Freq: Two times a day (BID) | CUTANEOUS | 3 refills | Status: AC

## 2022-09-03 MED ORDER — TALTZ 80 MG/ML ~~LOC~~ SOAJ
80.0000 mg | Freq: Once | SUBCUTANEOUS | 0 refills | Status: DC
Start: 2022-09-03 — End: 2022-09-03

## 2022-09-03 MED ORDER — TALTZ 80 MG/ML ~~LOC~~ SOAJ
80.0000 mg | SUBCUTANEOUS | 1 refills | Status: DC
Start: 2022-09-03 — End: 2022-09-03

## 2022-09-03 MED ORDER — COSENTYX SENSOREADY (300 MG) 150 MG/ML ~~LOC~~ SOAJ
300.0000 mg | SUBCUTANEOUS | 3 refills | Status: DC
Start: 2022-09-03 — End: 2023-06-16

## 2022-09-03 MED ORDER — CHLORHEXIDINE GLUCONATE 4 % EX SOLN: Freq: Every day | CUTANEOUS | 2 refills | Status: AC | PRN

## 2022-09-03 MED ORDER — COSENTYX SENSOREADY (300 MG) 150 MG/ML ~~LOC~~ SOAJ
300.0000 mg | SUBCUTANEOUS | 0 refills | Status: DC
Start: 2022-09-03 — End: 2023-06-16

## 2022-09-03 MED ORDER — MUPIROCIN 2 % EX OINT: 1.0000 | TOPICAL_OINTMENT | Freq: Two times a day (BID) | CUTANEOUS | 1 refills | Status: DC

## 2022-09-03 MED ORDER — DOXYCYCLINE HYCLATE 100 MG PO TABS: 100.0000 mg | ORAL_TABLET | Freq: Two times a day (BID) | ORAL | 0 refills | Status: AC

## 2022-09-03 MED ORDER — TALTZ 80 MG/ML ~~LOC~~ SOAJ
80.0000 mg | SUBCUTANEOUS | 3 refills | Status: DC
Start: 2022-09-03 — End: 2022-09-03

## 2022-09-03 NOTE — Patient Instructions (Signed)
Due to recent changes in healthcare laws, you may see results of your pathology and/or laboratory studies on MyChart before the doctors have had a chance to review them. We understand that in some cases there may be results that are confusing or concerning to you. Please understand that not all results are received at the same time and often the doctors may need to interpret multiple results in order to provide you with the best plan of care or course of treatment. Therefore, we ask that you please give us 2 business days to thoroughly review all your results before contacting the office for clarification. Should we see a critical lab result, you will be contacted sooner.   If You Need Anything After Your Visit  If you have any questions or concerns for your doctor, please call our main line at [phone] If no one answers, please leave a voicemail as directed and we will return your call as soon as possible. Messages left after 4 pm will be answered the following business day.   You may also send us a message via MyChart. We typically respond to MyChart messages within 1-2 business days.  For prescription refills, please ask your pharmacy to contact our office. Our fax number is [phone].  If you have an urgent issue when the clinic is closed that cannot wait until the next business day, you can page your doctor at the number below.    Please note that while we do our best to be available for urgent issues outside of office hours, we are not available 24/7.   If you have an urgent issue and are unable to reach us, you may choose to seek medical care at your doctor's office, retail clinic, urgent care center, or emergency room.  If you have a medical emergency, please immediately call 911 or go to the emergency department. In the event of inclement weather, please call our main line at [phone] for an update on the status of any delays or closures.  Dermatology Medication Tips: Please  keep the boxes that topical medications come in in order to help keep track of the instructions about where and how to use these. Pharmacies typically print the medication instructions only on the boxes and not directly on the medication tubes.   If your medication is too expensive, please contact our office at [phone] or send us a message through MyChart.   We are unable to tell what your co-pay for medications will be in advance as this is different depending on your insurance coverage. However, we may be able to find a substitute medication at lower cost or fill out paperwork to get insurance to cover a needed medication.   If a prior authorization is required to get your medication covered by your insurance company, please allow us 1-2 business days to complete this process.  Drug prices often vary depending on where the prescription is filled and some pharmacies may offer cheaper prices.  The website www.goodrx.com contains coupons for medications through different pharmacies. The prices here do not account for what the cost may be with help from insurance (it may be cheaper with your insurance), but the website can give you the price if you did not use any insurance.  - You can print the associated coupon and take it with your prescription to the pharmacy.  - You may also stop by our office during regular business hours and pick up a GoodRx coupon card.  - If you need your   prescription sent electronically to a different pharmacy, notify our office through [facility] MyChart or by phone at [phone]     

## 2022-09-03 NOTE — Progress Notes (Signed)
   Follow-Up Visit   Subjective  Christine [name] is a 52 y.o. female who presents for the following: Psoriasis 3 week follow up - she has been treating with Clobetasol and Calcipotriene ointments. She has been mixing these together and applying twice daily. She says the she is better than she was but it looks like it may be flaring again.  Pt does admit to joint pain / stiffness in the morning (ankles, hands)  The following portions of the chart were reviewed this encounter and updated as appropriate: medications, allergies, medical history  Review of Systems:  No other skin or systemic complaints except as noted in HPI or Assessment and Plan.  Objective  Well appearing patient in no apparent distress; mood and affect are within normal limits.  Areas Examined: Feet and legs  Relevant exam findings are noted in the Assessment and Plan.      Assessment & Plan   Local infection of skin and subcutaneous tissue  Related Procedures Aerobic Culture  Psoriasis  Related Medications Secukinumab, 300 MG Dose, (COSENTYX SENSOREADY, 300 MG,) 150 MG/ML SOAJ Inject 2 mLs (300 mg total) into the skin as directed. On week 0, 1, 2, 3 and 4.  Secukinumab, 300 MG Dose, (COSENTYX SENSOREADY, 300 MG,) 150 MG/ML SOAJ Inject 2 mLs (300 mg total) into the skin every 28 (twenty-eight) days. For maintenance.    PSORIASIS Hyperpigmented patches of legs. 15% BSA. PGA Score 3  Slightly improved from last visit.  Positive joint pain and pain when standing for long periods of time  Treatment Plan: We will plan to start Cosentyx pending insurance approval.  Continue Calcipotriene ointment twice daily while taking a break from clobetasol steroid cream  Counseling on psoriasis and coordination of care  psoriasis is a chronic non-curable, but treatable genetic/hereditary disease that may have other systemic features affecting other organ systems such as joints (Psoriatic Arthritis). It is associated  with an increased risk of inflammatory bowel disease, heart disease, non-alcoholic fatty liver disease, and depression.  Treatments include light and laser treatments; topical medications; and systemic medications including oral and injectables.  Skin Infection Exam: 1.0 cm ulceration down to the level of deep dermis  Treatment Plan: Aerobic culture performed today  Start Doxycycline 100 mg 1 po bid with food and plenty of fluid x 10 days  Apply Mupirocin ointment bid to ulcerated area  Wash foot daily with Hibiclens     Return in about 1 month (around 10/04/2022) for Psoriasis.  I, Joanie Coddington, CMA, am acting as scribe for Langston Reusing, MD .   Documentation: I have reviewed the above documentation for accuracy and completeness, and I agree with the above.  Langston Reusing, MD

## 2022-09-04 ENCOUNTER — Other Ambulatory Visit: Payer: Self-pay | Admitting: Vascular Surgery

## 2022-09-04 ENCOUNTER — Other Ambulatory Visit: Payer: Self-pay | Admitting: Student

## 2022-09-04 ENCOUNTER — Other Ambulatory Visit: Payer: Self-pay | Admitting: Dermatology

## 2022-09-04 ENCOUNTER — Other Ambulatory Visit: Payer: Self-pay | Admitting: Family Medicine

## 2022-09-04 DIAGNOSIS — L989 Disorder of the skin and subcutaneous tissue, unspecified: Secondary | ICD-10-CM

## 2022-09-05 ENCOUNTER — Telehealth: Payer: Self-pay | Admitting: Dermatology

## 2022-09-05 LAB — AEROBIC CULTURE

## 2022-09-05 NOTE — Progress Notes (Signed)
Hi Dacey,  The culture from the wound on your foot grew out a bacteria called Staphylococcus aureus.  Fortunately, it is cover by the oral and topical antibiotic prescribed at your office visit so please continue taking them and finish the full course.    Stay Well,  Dr. Onalee Hua

## 2022-09-05 NOTE — Telephone Encounter (Signed)
.    [facility] called on 09/05/22 and spoke with pharmacist to clarify rx.  Thanks!

## 2022-09-05 NOTE — Telephone Encounter (Signed)
Pt must get refills from PCP.

## 2022-09-05 NOTE — Telephone Encounter (Signed)
Hiadi from [facility] left a voicemail in regards to patient [employer]. They are requesting a call back in regards to some of patient prescriptions. Call back number [phone]

## 2022-09-17 ENCOUNTER — Encounter: Payer: Self-pay | Admitting: Dermatology

## 2022-09-19 ENCOUNTER — Telehealth: Payer: Self-pay

## 2022-09-19 NOTE — Telephone Encounter (Signed)
Patient LVM on nurse line asking for a call back.   I called back, however no answer or option for VM.

## 2022-09-20 ENCOUNTER — Ambulatory Visit: Payer: 59

## 2022-09-20 NOTE — Progress Notes (Deleted)
    SUBJECTIVE:   CHIEF COMPLAINT / HPI:   Seen by dermatology 09/03/22 was started on doxycycline 100 BID for 10 days, mupirocin BID  and hibiclens. Started on immunologic secukinumab (Cosentyx) for psoriasis at that visit.   PERTINENT  PMH / PSH: ***  OBJECTIVE:   There were no vitals taken for this visit.  ***  ASSESSMENT/PLAN:   No problem-specific Assessment & Plan notes found for this encounter.     Levin Erp, MD [facility]

## 2022-09-26 ENCOUNTER — Ambulatory Visit: Payer: Medicaid Other | Admitting: Allergy & Immunology

## 2022-09-26 ENCOUNTER — Ambulatory Visit: Payer: Medicaid Other | Admitting: Allergy

## 2022-10-02 ENCOUNTER — Ambulatory Visit: Payer: Commercial Managed Care - HMO | Admitting: Allergy

## 2022-10-07 ENCOUNTER — Encounter: Payer: Self-pay | Admitting: Dermatology

## 2022-10-07 ENCOUNTER — Ambulatory Visit (INDEPENDENT_AMBULATORY_CARE_PROVIDER_SITE_OTHER): Payer: 59 | Admitting: Dermatology

## 2022-10-07 DIAGNOSIS — F17210 Nicotine dependence, cigarettes, uncomplicated: Secondary | ICD-10-CM

## 2022-10-07 DIAGNOSIS — L089 Local infection of the skin and subcutaneous tissue, unspecified: Secondary | ICD-10-CM

## 2022-10-07 DIAGNOSIS — L97529 Non-pressure chronic ulcer of other part of left foot with unspecified severity: Secondary | ICD-10-CM | POA: Diagnosis not present

## 2022-10-07 DIAGNOSIS — L409 Psoriasis, unspecified: Secondary | ICD-10-CM

## 2022-10-07 MED ORDER — MUPIROCIN 2 % EX OINT
1.0000 | TOPICAL_OINTMENT | Freq: Two times a day (BID) | CUTANEOUS | 1 refills | Status: AC
Start: 2022-10-07 — End: ?

## 2022-10-07 NOTE — Progress Notes (Signed)
   Follow-Up Visit   Subjective  Christine [name] is a 52 y.o. female who presents for the following: Psoriasis  Patient present today for follow up visit for Psoriasis. Patient was last evaluated on 09/03/22. Patient reports sxs are flared. Patient reports denies medication changes. At her previous office visit she was advised to start using Calcipotriene ointment twice daily while taking a break from Clobetasol Cream. Patient reports she started back using the Clobetasol cream 2 times daily last week. Patient reports a wound on left foot that she's been treating at home for 1 week that she is also putting Clobetasol cream & Mupirocin on the wound as well. Patient reports that she has completed the course of doxycyline.  The following portions of the chart were reviewed this encounter and updated as appropriate: medications, allergies, medical history  Review of Systems:  No other skin or systemic complaints except as noted in HPI or Assessment and Plan.  Objective  Well appearing patient in no apparent distress; mood and affect are within normal limits.  A focused examination was performed of the following areas: Legs and feet  Relevant exam findings are noted in the Assessment and Plan.         Assessment & Plan   PSORIASIS Exam:  Hyperpigmented patches of legs 15% BSA.  Flared  Patient reports joint pain  Psoriasis is a chronic non-curable, but treatable genetic/hereditary disease that may have other systemic features affecting other organ systems such as joints (Psoriatic Arthritis). It is associated with an increased risk of inflammatory bowel disease, heart disease, non-alcoholic fatty liver disease, and depression.  Treatments include light and laser treatments; topical medications; and systemic medications including oral and injectables.  Treatment Plan: -Pt's rx for Cosentyx was transferred to [facility]  -Advised pt to call [facility] for Cosentix update  Wound Exam:  healing ulceration on sole of left foot at base of 1st digit  Treatment Plan: - Referral to podiatry for extensive wound treatment - Patient educated the importance of quitting Smoking to improve chances of wound healing - Patient educated on avoiding peroxide and alcohol soaks. to stop using Clobetasol  - Recommended Cerve SA Cleanser to soften callus on feet to prevent Skin on feet from cracking and opening   Local infection of skin and subcutaneous tissue  Related Medications mupirocin ointment (BACTROBAN) 2 % Apply 1 Application topically 2 (two) times daily. Apply to affected area of left foot   No follow-ups on file.   Documentation: I have reviewed the above documentation for accuracy and completeness, and I agree with the above.  Langston Reusing, DO

## 2022-10-07 NOTE — Patient Instructions (Signed)
Due to recent changes in healthcare laws, you may see results of your pathology and/or laboratory studies on MyChart before the doctors have had a chance to review them. We understand that in some cases there may be results that are confusing or concerning to you. Please understand that not all results are received at the same time and often the doctors may need to interpret multiple results in order to provide you with the best plan of care or course of treatment. Therefore, we ask that you please give us 2 business days to thoroughly review all your results before contacting the office for clarification. Should we see a critical lab result, you will be contacted sooner.   If You Need Anything After Your Visit  If you have any questions or concerns for your doctor, please call our main line at [phone] If no one answers, please leave a voicemail as directed and we will return your call as soon as possible. Messages left after 4 pm will be answered the following business day.   You may also send us a message via MyChart. We typically respond to MyChart messages within 1-2 business days.  For prescription refills, please ask your pharmacy to contact our office. Our fax number is [phone].  If you have an urgent issue when the clinic is closed that cannot wait until the next business day, you can page your doctor at the number below.    Please note that while we do our best to be available for urgent issues outside of office hours, we are not available 24/7.   If you have an urgent issue and are unable to reach us, you may choose to seek medical care at your doctor's office, retail clinic, urgent care center, or emergency room.  If you have a medical emergency, please immediately call 911 or go to the emergency department. In the event of inclement weather, please call our main line at [phone] for an update on the status of any delays or closures.  Dermatology Medication Tips: Please  keep the boxes that topical medications come in in order to help keep track of the instructions about where and how to use these. Pharmacies typically print the medication instructions only on the boxes and not directly on the medication tubes.   If your medication is too expensive, please contact our office at [phone] or send us a message through MyChart.   We are unable to tell what your co-pay for medications will be in advance as this is different depending on your insurance coverage. However, we may be able to find a substitute medication at lower cost or fill out paperwork to get insurance to cover a needed medication.   If a prior authorization is required to get your medication covered by your insurance company, please allow us 1-2 business days to complete this process.  Drug prices often vary depending on where the prescription is filled and some pharmacies may offer cheaper prices.  The website www.goodrx.com contains coupons for medications through different pharmacies. The prices here do not account for what the cost may be with help from insurance (it may be cheaper with your insurance), but the website can give you the price if you did not use any insurance.  - You can print the associated coupon and take it with your prescription to the pharmacy.  - You may also stop by our office during regular business hours and pick up a GoodRx coupon card.  - If you need your   prescription sent electronically to a different pharmacy, notify our office through [facility] MyChart or by phone at [phone]     

## 2022-10-10 ENCOUNTER — Telehealth: Payer: Self-pay | Admitting: Dermatology

## 2022-10-10 NOTE — Telephone Encounter (Signed)
Patient called in advising that she called the number that she was provided, so she can request her medication but she doesn't recall what the name of the medication is. If someone could reach out to patient to advise? Best number to contact [phone]

## 2022-10-10 NOTE — Telephone Encounter (Signed)
Hi Christine [name], We saw this pt on Monday.  Can you call and remind her of the following Rx was transferred to [facility]:  Transfer Call: Call to Patient Complete Transfer Info: Name: [facility] Phone: [phone] Fax: [phone]

## 2022-10-14 ENCOUNTER — Telehealth: Payer: Self-pay | Admitting: Internal Medicine

## 2022-10-14 NOTE — Telephone Encounter (Signed)
Called patient regarding upcoming July appointments, patient is notified. 

## 2022-10-28 ENCOUNTER — Other Ambulatory Visit: Payer: Self-pay | Admitting: Podiatry

## 2022-10-28 ENCOUNTER — Ambulatory Visit (INDEPENDENT_AMBULATORY_CARE_PROVIDER_SITE_OTHER): Payer: Medicaid Other | Admitting: Podiatry

## 2022-10-28 ENCOUNTER — Inpatient Hospital Stay (HOSPITAL_COMMUNITY)
Admission: EM | Admit: 2022-10-28 | Discharge: 2022-11-02 | DRG: 617 | Disposition: A | Discharge status: Home / Self Care | Payer: 59 | Attending: Family Medicine | Admitting: Family Medicine

## 2022-10-28 ENCOUNTER — Encounter (HOSPITAL_COMMUNITY): Payer: Self-pay

## 2022-10-28 ENCOUNTER — Other Ambulatory Visit: Payer: Self-pay

## 2022-10-28 ENCOUNTER — Ambulatory Visit (INDEPENDENT_AMBULATORY_CARE_PROVIDER_SITE_OTHER): Payer: Medicaid Other

## 2022-10-28 DIAGNOSIS — F1010 Alcohol abuse, uncomplicated: Secondary | ICD-10-CM | POA: Diagnosis present

## 2022-10-28 DIAGNOSIS — M869 Osteomyelitis, unspecified: Secondary | ICD-10-CM | POA: Diagnosis not present

## 2022-10-28 DIAGNOSIS — Z23 Encounter for immunization: Secondary | ICD-10-CM

## 2022-10-28 DIAGNOSIS — M86172 Other acute osteomyelitis, left ankle and foot: Secondary | ICD-10-CM

## 2022-10-28 DIAGNOSIS — E1151 Type 2 diabetes mellitus with diabetic peripheral angiopathy without gangrene: Secondary | ICD-10-CM | POA: Diagnosis present

## 2022-10-28 DIAGNOSIS — E1140 Type 2 diabetes mellitus with diabetic neuropathy, unspecified: Secondary | ICD-10-CM | POA: Diagnosis present

## 2022-10-28 DIAGNOSIS — D649 Anemia, unspecified: Secondary | ICD-10-CM

## 2022-10-28 DIAGNOSIS — Z7984 Long term (current) use of oral hypoglycemic drugs: Secondary | ICD-10-CM

## 2022-10-28 DIAGNOSIS — E13621 Other specified diabetes mellitus with foot ulcer: Principal | ICD-10-CM

## 2022-10-28 DIAGNOSIS — Z789 Other specified health status: Secondary | ICD-10-CM | POA: Diagnosis present

## 2022-10-28 DIAGNOSIS — F17210 Nicotine dependence, cigarettes, uncomplicated: Secondary | ICD-10-CM | POA: Diagnosis present

## 2022-10-28 DIAGNOSIS — I739 Peripheral vascular disease, unspecified: Secondary | ICD-10-CM | POA: Insufficient documentation

## 2022-10-28 DIAGNOSIS — Z79899 Other long term (current) drug therapy: Secondary | ICD-10-CM

## 2022-10-28 DIAGNOSIS — L409 Psoriasis, unspecified: Secondary | ICD-10-CM | POA: Insufficient documentation

## 2022-10-28 DIAGNOSIS — M009 Pyogenic arthritis, unspecified: Secondary | ICD-10-CM | POA: Diagnosis present

## 2022-10-28 DIAGNOSIS — L97529 Non-pressure chronic ulcer of other part of left foot with unspecified severity: Secondary | ICD-10-CM | POA: Diagnosis present

## 2022-10-28 DIAGNOSIS — I10 Essential (primary) hypertension: Secondary | ICD-10-CM | POA: Diagnosis present

## 2022-10-28 DIAGNOSIS — E1169 Type 2 diabetes mellitus with other specified complication: Secondary | ICD-10-CM | POA: Diagnosis not present

## 2022-10-28 DIAGNOSIS — E538 Deficiency of other specified B group vitamins: Secondary | ICD-10-CM | POA: Diagnosis present

## 2022-10-28 DIAGNOSIS — E11621 Type 2 diabetes mellitus with foot ulcer: Secondary | ICD-10-CM | POA: Diagnosis present

## 2022-10-28 DIAGNOSIS — I70202 Unspecified atherosclerosis of native arteries of extremities, left leg: Secondary | ICD-10-CM | POA: Diagnosis present

## 2022-10-28 LAB — COMPREHENSIVE METABOLIC PANEL
ALT: 8 U/L (ref 0–44)
AST: 15 U/L (ref 15–41)
Albumin: 2.6 g/dL — ABNORMAL LOW (ref 3.5–5.0)
Alkaline Phosphatase: 167 U/L — ABNORMAL HIGH (ref 38–126)
Anion gap: 9 (ref 5–15)
BUN: 12 mg/dL (ref 6–20)
CO2: 18 mmol/L — ABNORMAL LOW (ref 22–32)
Calcium: 8.3 mg/dL — ABNORMAL LOW (ref 8.9–10.3)
Chloride: 104 mmol/L (ref 98–111)
Creatinine, Ser: 1.24 mg/dL — ABNORMAL HIGH (ref 0.44–1.00)
GFR, Estimated: 53 mL/min — ABNORMAL LOW (ref >60)
Glucose, Bld: 149 mg/dL — ABNORMAL HIGH (ref 70–99)
Potassium: 4.2 mmol/L (ref 3.5–5.1)
Sodium: 131 mmol/L — ABNORMAL LOW (ref 135–145)
Total Bilirubin: 0.3 mg/dL (ref 0.3–1.2)
Total Protein: 7 g/dL (ref 6.5–8.1)

## 2022-10-28 LAB — CBC WITH DIFFERENTIAL/PLATELET
Abs Immature Granulocytes: 0.03 10*3/uL (ref 0.00–0.07)
Basophils Absolute: 0 10*3/uL (ref 0.0–0.1)
Basophils Relative: 0 %
Eosinophils Absolute: 0.1 10*3/uL (ref 0.0–0.5)
Eosinophils Relative: 2 %
HCT: 26.8 % — ABNORMAL LOW (ref 36.0–46.0)
Hemoglobin: 8.4 g/dL — ABNORMAL LOW (ref 12.0–15.0)
Immature Granulocytes: 0 %
Lymphocytes Relative: 23 %
Lymphs Abs: 1.6 10*3/uL (ref 0.7–4.0)
MCH: 29.9 pg (ref 26.0–34.0)
MCHC: 31.3 g/dL (ref 30.0–36.0)
MCV: 95.4 fL (ref 80.0–100.0)
Monocytes Absolute: 0.6 10*3/uL (ref 0.1–1.0)
Monocytes Relative: 9 %
Neutro Abs: 4.6 10*3/uL (ref 1.7–7.7)
Neutrophils Relative %: 66 %
Platelets: 263 10*3/uL (ref 150–400)
RBC: 2.81 MIL/uL — ABNORMAL LOW (ref 3.87–5.11)
RDW: 16.2 % — ABNORMAL HIGH (ref 11.5–15.5)
WBC: 7 10*3/uL (ref 4.0–10.5)
nRBC: 0 % (ref 0.0–0.2)

## 2022-10-28 LAB — HEMOGLOBIN A1C
Hgb A1c MFr Bld: 6.2 % — ABNORMAL HIGH (ref 4.8–5.6)
Mean Plasma Glucose: 131.24 mg/dL

## 2022-10-28 LAB — HCG, SERUM, QUALITATIVE: Preg, Serum: NEGATIVE

## 2022-10-28 LAB — LACTIC ACID, PLASMA: Lactic Acid, Venous: 1.2 mmol/L (ref 0.5–1.9)

## 2022-10-28 MED ORDER — ENOXAPARIN SODIUM 40 MG/0.4ML IJ SOSY
40.0000 mg | PREFILLED_SYRINGE | INTRAMUSCULAR | Status: DC
  Administered 2022-10-28 – 2022-10-30 (×3): 40 mg via SUBCUTANEOUS
  Filled 2022-10-28 (×3): qty 0.4

## 2022-10-28 MED ORDER — GABAPENTIN 300 MG PO CAPS
300.0000 mg | ORAL_CAPSULE | Freq: Three times a day (TID) | ORAL | Status: DC
  Administered 2022-10-28 – 2022-11-02 (×14): 300 mg via ORAL
  Filled 2022-10-28 (×14): qty 1

## 2022-10-28 MED ORDER — PNEUMOCOCCAL 20-VAL CONJ VACC 0.5 ML IM SUSY
0.5000 mL | PREFILLED_SYRINGE | INTRAMUSCULAR | Status: AC
  Administered 2022-10-29: 0.5 mL via INTRAMUSCULAR
  Filled 2022-10-28: qty 0.5

## 2022-10-28 MED ORDER — THIAMINE MONONITRATE 100 MG PO TABS
100.0000 mg | ORAL_TABLET | Freq: Every day | ORAL | Status: DC
  Administered 2022-10-28 – 2022-11-02 (×6): 100 mg via ORAL
  Filled 2022-10-28 (×6): qty 1

## 2022-10-28 MED ORDER — METRONIDAZOLE 500 MG PO TABS
500.0000 mg | ORAL_TABLET | Freq: Two times a day (BID) | ORAL | Status: DC
  Administered 2022-10-28 – 2022-10-31 (×6): 500 mg via ORAL
  Filled 2022-10-28 (×6): qty 1

## 2022-10-28 MED ORDER — ADULT MULTIVITAMIN W/MINERALS CH
1.0000 | ORAL_TABLET | Freq: Every day | ORAL | Status: DC
  Administered 2022-10-28 – 2022-11-02 (×6): 1 via ORAL
  Filled 2022-10-28 (×6): qty 1

## 2022-10-28 MED ORDER — FOLIC ACID 1 MG PO TABS
1.0000 mg | ORAL_TABLET | Freq: Every day | ORAL | Status: DC
  Administered 2022-10-28 – 2022-11-02 (×6): 1 mg via ORAL
  Filled 2022-10-28 (×6): qty 1

## 2022-10-28 MED ORDER — ROSUVASTATIN CALCIUM 5 MG PO TABS
10.0000 mg | ORAL_TABLET | Freq: Every day | ORAL | Status: DC
  Administered 2022-10-28 – 2022-11-02 (×6): 10 mg via ORAL
  Filled 2022-10-28 (×6): qty 2

## 2022-10-28 MED ORDER — SODIUM CHLORIDE 0.9 % IV SOLN
2.0000 g | INTRAVENOUS | Status: DC
  Administered 2022-10-28 – 2022-10-30 (×3): 2 g via INTRAVENOUS
  Filled 2022-10-28 (×3): qty 20

## 2022-10-28 NOTE — Assessment & Plan Note (Addendum)
Noted on outpatient imaging. In the setting of diabetic foot wound.  There is obvious cortical erosion at the lateral aspect of the proximal phalanx and metatarsal head of the left foot.  In office ABIs suggestive of arterial disease which will further complicate her ability to heal any wound.  A surface aerobic culture of the wound taken about 2 months ago grew MSSA. -Admit to MedSurg -Podiatry will see while inpatient -MRI foot  -Initiating therapy with CTX and Flagyl, can narrow as able -Consult to WOC for maceration between toes  -Continue gabapentin for neuropathic pain

## 2022-10-28 NOTE — Assessment & Plan Note (Signed)
Seems this has been a long-standing issue for her. Saw Dr. Arbutus Ped in April and was recommended to take iron daily. Do not believe she has been taking this. Hgb 8.4 today, was 10.3 at her last visit with Dr. Arbutus Ped. - Will obtain iron studies

## 2022-10-28 NOTE — Progress Notes (Signed)
Chief Complaint  Patient presents with   Wound Check    Pt is a diabetic with a wound on the bottom of her left toe and a place between her toe that is not healing causing her some discomfort     Subjective:  52 y.o. female new patient with PMHx diabetes mellitus, last A1c 02/20/2022 was 6.3, presenting for worsening infection to the left forefoot.  Patient has noticed it for a few months now.  Denies history of injury.  She has noticed increased pain and tenderness associated to the left foot.  Currently denies fever chills nausea.   Past Medical History:  Diagnosis Date   Alcohol abuse    Angio-edema    Cellulitis 11/2016   RIGHT ARM   Chronic pancreatitis (HCC)    Diabetes mellitus    Esophageal candidiasis (HCC) 03/12/2019   Hypertension    Substance abuse (HCC)     Past Surgical History:  Procedure Laterality Date   BREAST REDUCTION SURGERY     No Known Allergies   Objective/Physical Exam General: The patient is alert and oriented x3 in no acute distress.  Dermatology:  Ulcer noted with maceration to the interdigital webspace between the great toe and the second digit.  Serous drainage noted.  No significant malodor.  At the moment there is no purulence.  Vascular: LT foot and leg is warm compared to the contralateral limb with increased edema.  Concern for cellulitis due to underlying osteomyelitis and ulcer. VAS [place] ABI W/WO TBI 03/14/2021 ABI Findings:  +---------+------------------+-----+--------+--------+  Right   Rt Pressure (mmHg)IndexWaveformComment   +---------+------------------+-----+--------+--------+  Brachial 166                                      +---------+------------------+-----+--------+--------+  PTA     128               0.77 biphasic          +---------+------------------+-----+--------+--------+  DP      137               0.83 biphasic          +---------+------------------+-----+--------+--------+  Great Toe103                0.62                   +---------+------------------+-----+--------+--------+   +---------+------------------+-----+--------+-------+  Left    Lt Pressure (mmHg)IndexWaveformComment  +---------+------------------+-----+--------+-------+  Brachial 166                                     +---------+------------------+-----+--------+-------+  PTA     137               0.83 biphasic         +---------+------------------+-----+--------+-------+  DP      139               0.84 biphasic         +---------+------------------+-----+--------+-------+  Great Toe106               0.64                  +---------+------------------+-----+--------+-------+   +-------+-----------+-----------+------------+------------+  ABI/TBIToday's ABIToday's TBIPrevious ABIPrevious TBI  +-------+-----------+-----------+------------+------------+  Right 0.83       0.62  0.88        0.66          +-------+-----------+-----------+------------+------------+  Left  0.84       0.64       0.96        0.69          +-------+-----------+-----------+------------+------------+  Summary:  Right: Resting right ankle-brachial index indicates mild right lower  extremity arterial disease. The right toe-brachial index is abnormal.   Left: Resting left ankle-brachial index indicates mild left lower  extremity arterial disease. The left toe-brachial index is abnormal.   Neurological: Light touch and protective threshold diminished bilaterally.   Musculoskeletal Exam: No prior amputations.  Patient ambulatory  Radiographic exam LT foot 10/28/2022: Osseous erosion and fragmentation noted around the lateral aspect of the proximal phalanx and metatarsal head left foot.  No identifiable gas within the tissue.  Clinically this correlates directly with the draining sinus and ulcer to the interdigital webspace. IMPRESSION: Acute osteomyelitis left great toe first  metatarsophalangeal joint.  Assessment: 1.  Ulcer interdigital webspace left foot secondary to diabetes mellitus 2. diabetes mellitus w/ peripheral neuropathy.  A1c 02/20/2022 was 6.3 3.  Peripheral vascular disease   Plan of Care:  -Patient was evaluated.  X-rays reviewed.  Comprehensive diabetic foot exam performed today -Due to the underlying osteomyelitis with increased warmth and swelling to the left lower extremity; complicated by concern for peripheral vascular disease, I do recommend that the patient goes immediately to the emergency department for full evaluation and workup.  Explained to the patient that if this does not get treated and managed promptly infection can spread and she could potentially lose her leg and this could become life-threatening.  Patient understands -Dressings applied.  Postsurgical shoe dispensed.  WBAT -Explained to the patient that she will likely need at least a partial first ray amputation based on radiographic findings of the underlying osteomyelitis. -Patient states that she will go to the [facility] ED today.  Recommend: MRI LT FOOT WO CONTRAST, updated ABI, start on IV abx, please consult podiatry when patient is admitted.     Felecia Shelling, DPM [facility]  Dr. Felecia Shelling, DPM    [address] [place], [place] [address]                Office [phone]  Fax [phone]

## 2022-10-28 NOTE — ED Notes (Signed)
ED TO INPATIENT HANDOFF REPORT  ED Nurse Name and Phone #: [phone]  S Name/Age/Gender Christine [name] 52 y.o. female Room/Bed: 019C/019C  Code Status   Code Status: Full Code  Home/SNF/Other Home Patient oriented to: self, place, time, and situation Is this baseline? Yes   Triage Complete: Triage complete  Chief Complaint Osteomyelitis [facility]) [M86.9]  Triage Note Pt came in via POV d/t her foot doctor telling her to come into ED after xray's they performed in the office. Pt reports her Lt great toe is black & there is puss coming out of the top of the great toe as well. A/Ox4, denies pain currently, decreased sensation in that toe, denies fevers.    Allergies No Known Allergies  Level of Care/Admitting Diagnosis ED Disposition     ED Disposition  Admit   Condition  --   Comment  Hospital Area: [facility] [[id number]]  Level of Care: Med-Surg [16]  May place patient in observation at [facility] or [facility] if equivalent level of care is available:: No  Covid Evaluation: Asymptomatic - no recent exposure (last 10 days) testing not required  Diagnosis: Osteomyelitis [facility]) [[id number]]  Admitting Physician: Alicia Amel [[id number]]  Attending Physician: Nestor Ramp [[id number]]          B Medical/Surgery History Past Medical History:  Diagnosis Date   Alcohol abuse    Angio-edema    Cellulitis 11/2016   RIGHT ARM   Chronic pancreatitis (HCC)    Diabetes mellitus    Esophageal candidiasis (HCC) 03/12/2019   Hypertension    Substance abuse (HCC)    Past Surgical History:  Procedure Laterality Date   BREAST REDUCTION SURGERY       A IV Location/Drains/Wounds Patient Lines/Drains/Airways Status     Active Line/Drains/Airways     Name Placement date Placement time Site Days   Peripheral IV 10/28/22 20 G Left;Posterior Wrist 10/28/22  1713  Wrist  less than 1   Wound / Incision (Open or Dehisced) 12/03/16 Non-pressure wound Arm Right;Upper  12/03/16  1730  Arm  2155   Wound / Incision (Open or Dehisced) 12/03/16 Non-pressure wound Arm Right 12/03/16  1730  Arm  2155   Wound / Incision (Open or Dehisced) 12/03/16 Non-pressure wound Leg Right 12/03/16  1730  Leg  2155   Wound / Incision (Open or Dehisced) 12/03/16 Non-pressure wound Leg Left;Lower 12/03/16  1730  Leg  2155   Wound / Incision (Open or Dehisced) 12/03/16 Leg Left;Mid 12/03/16  1730  Leg  2155   Wound / Incision (Open or Dehisced) 12/03/16 Non-pressure wound Leg Left;Lateral 12/03/16  1730  Leg  2155            Intake/Output Last 24 hours No intake or output data in the 24 hours ending 10/28/22 1726  Labs/Imaging Results for orders placed or performed during the hospital encounter of 10/28/22 (from the past 48 hour(s))  Lactic acid, plasma     Status: None   Collection Time: 10/28/22  2:28 PM  Result Value Ref Range   Lactic Acid, Venous 1.2 0.5 - 1.9 mmol/L    Comment: Performed at [facility], [address], [place], [place] [address]  Comprehensive metabolic panel     Status: Abnormal   Collection Time: 10/28/22  2:39 PM  Result Value Ref Range   Sodium 131 (L) 135 - 145 mmol/L   Potassium 4.2 3.5 - 5.1 mmol/L   Chloride 104 98 -  111 mmol/L   CO2 18 (L) 22 - 32 mmol/L   Glucose, Bld 149 (H) 70 - 99 mg/dL    Comment: Glucose reference range applies only to samples taken after fasting for at least 8 hours.   BUN 12 6 - 20 mg/dL   Creatinine, Ser 1.61 (H) 0.44 - 1.00 mg/dL   Calcium 8.3 (L) 8.9 - 10.3 mg/dL   Total Protein 7.0 6.5 - 8.1 g/dL   Albumin 2.6 (L) 3.5 - 5.0 g/dL   AST 15 15 - 41 U/L   ALT 8 0 - 44 U/L   Alkaline Phosphatase 167 (H) 38 - 126 U/L   Total Bilirubin 0.3 0.3 - 1.2 mg/dL   GFR, Estimated 53 (L) >60 mL/min    Comment: (NOTE) Calculated using the CKD-EPI Creatinine Equation (2021)    Anion gap 9 5 - 15    Comment: Performed at [facility], [address], [place], [place] [address]  CBC with Differential      Status: Abnormal   Collection Time: 10/28/22  2:39 PM  Result Value Ref Range   WBC 7.0 4.0 - 10.5 K/uL   RBC 2.81 (L) 3.87 - 5.11 MIL/uL   Hemoglobin 8.4 (L) 12.0 - 15.0 g/dL   HCT 54.0 (L) 98.1 - 19.1 %   MCV 95.4 80.0 - 100.0 fL   MCH 29.9 26.0 - 34.0 pg   MCHC 31.3 30.0 - 36.0 g/dL   RDW 47.8 (H) 29.5 - 62.1 %   Platelets 263 150 - 400 K/uL   nRBC 0.0 0.0 - 0.2 %   Neutrophils Relative % 66 %   Neutro Abs 4.6 1.7 - 7.7 K/uL   Lymphocytes Relative 23 %   Lymphs Abs 1.6 0.7 - 4.0 K/uL   Monocytes Relative 9 %   Monocytes Absolute 0.6 0.1 - 1.0 K/uL   Eosinophils Relative 2 %   Eosinophils Absolute 0.1 0.0 - 0.5 K/uL   Basophils Relative 0 %   Basophils Absolute 0.0 0.0 - 0.1 K/uL   Immature Granulocytes 0 %   Abs Immature Granulocytes 0.03 0.00 - 0.07 K/uL    Comment: Performed at [facility], [address], [place], [place] [address]   DG Foot Complete Left  Result Date: 10/28/2022 Please see detailed radiograph report in office note.   Pending Labs Unresulted Labs (From admission, onward)     Start     Ordered   10/29/22 0500  CBC  Tomorrow morning,   R        10/28/22 1718   10/29/22 0500  Basic metabolic panel  Tomorrow morning,   R        10/28/22 1718   10/28/22 1718  Hemoglobin A1c  Once,   R        10/28/22 1718   10/28/22 1617  Blood Cultures x 2 sites  BLOOD CULTURE X 2,   STAT      10/28/22 1617   10/28/22 1617  hCG, serum, qualitative  Once,   URGENT        10/28/22 1617            Vitals/Pain Today's Vitals   10/28/22 1418 10/28/22 1426 10/28/22 1630 10/28/22 1700  BP: 132/82  (!) 155/83 (!) 153/92  Pulse: 90  85 91  Resp: 18  16 14   Temp: 98 F (36.7 C)     TempSrc: Oral     SpO2: 100%  99% 100%  PainSc:  0-No pain      Isolation Precautions No active isolations  Medications Medications  cefTRIAXone (ROCEPHIN) 2 g in sodium chloride 0.9 % 100 mL IVPB (2 g Intravenous New Bag/Given 10/28/22 1714)    And  metroNIDAZOLE  (FLAGYL) tablet 500 mg (has no administration in time range)  rosuvastatin (CRESTOR) tablet 10 mg (has no administration in time range)  gabapentin (NEURONTIN) capsule 300 mg (has no administration in time range)  enoxaparin (LOVENOX) injection 40 mg (has no administration in time range)    Mobility walks     Focused Assessments Peripheral Vascular - pedal pulses intact   R Recommendations: See Admitting Provider Note  Report given to:   Additional Notes:

## 2022-10-28 NOTE — Progress Notes (Signed)
FMTS Interim Progress Note  S: In to evaluate patient at the bedside for nighttime rounds with Dr.Baloch.  Resting comfortably in bed with multiple blankets on.  She does say that she feels cold.  Otherwise, denies pain or other complaints.  She had questions about the plan going forward with her foot wound.  O: BP (!) 162/90   Pulse 90   Temp 97.6 F (36.4 C) (Axillary)   Resp 14   Ht 5' 9.5" (1.765 m)   Wt 63.5 kg   SpO2 100%   BMI 20.38 kg/m   General: Well-appearing, pleasant, no distress Respiratory: Normal work of breathing on room air Extremities: Left foot ulcer, no purulence.   A/P: 52 year old female with osteomyelitis.  Clinically stable.  No signs of systemic infection at this time. -Podiatry to see patient in the a.m. -Continue ceftriaxone and Flagyl for now -Gabapentin for neuropathic pain -Monitor vital signs  Alcohol use Patient with significant daily alcohol use disorder and history of chronic alcoholic pancreatitis.  No signs of withdrawal at this time. -CIWA without Ativan  Darral Dash, DO 10/28/2022, 10:03 PM PGY-3, [facility] Service pager [phone]

## 2022-10-28 NOTE — ED Provider Notes (Signed)
[place] EMERGENCY DEPARTMENT AT [facility] Provider Note   CSN: [id number] Arrival date & time: 10/28/22  1333     History {Add pertinent medical, surgical, social history, OB history to HPI:1} Chief Complaint  Patient presents with   abnormal scan    Christine [name] is a 52 y.o. female.  52 year old with a history of diabetes here for left lower extremity diabetic foot ulcer.  The history is provided by the patient.       Home Medications Prior to Admission medications   Medication Sig Start Date End Date Taking? Authorizing Provider  Accu-Chek FastClix Lancets MISC Please use to check blood sugar up to 4 times daily. E11.9 Patient not taking: Reported on 08/05/2022 03/14/22   Moses Manners, MD  blood glucose meter kit and supplies KIT Dispense based on patient and insurance preference. Use up to four times daily as directed. Patient not taking: Reported on 08/05/2022 03/13/22   Vonna Drafts, MD  calcipotriene (DOVONOX) 0.005 % cream APPLY TOPICALLY DAILY. APPLY DAILY TO AFFECTED AREAS DAILY X 2 WEEKS THEN START CLOBETASOL 09/04/22   Terri Piedra, DO  calcipotriene (DOVONOX) 0.005 % ointment Apply topically 2 (two) times daily. 09/03/22   Terri Piedra, DO  cetirizine (ZYRTEC) 10 MG tablet Take 1 tablet (10 mg total) by mouth 2 (two) times daily. 06/27/22 07/27/22  Alfonse Spruce, MD  chlorhexidine (HIBICLENS) 4 % external liquid Apply topically daily as needed. 09/03/22   Terri Piedra, DO  CLOBETASOL PROPIONATE E 0.05 % emollient cream APPLY TO AFFECTED AREA TWICE A DAY 09/04/22   Terri Piedra, DO  famotidine (PEPCID) 40 MG tablet Take 1 tablet (40 mg total) by mouth 2 (two) times daily. 07/22/22 08/21/22  Alfonse Spruce, MD  fexofenadine [facility] ALLERGY) 180 MG tablet Take 2 tablets (360 mg total) by mouth in the morning and at bedtime. Patient not taking: Reported on 08/13/2022 07/22/22 08/21/22  Alfonse Spruce, MD  gabapentin  (NEURONTIN) 300 MG capsule TAKE 1 CAPSULE BY MOUTH THREE TIMES A DAY Strength: 300 mg 07/05/22   Jerre Simon, MD  glucose blood (ACCU-CHEK GUIDE) test strip Please use to check blood sugar up to 4 times daily. E11.9 03/14/22   Moses Manners, MD  hydrocortisone 2.5 % lotion Apply topically 2 (two) times daily. Place on sites of rash 2 times daily. 05/14/22   Gerhard Munch, MD  hydrOXYzine (ATARAX) 25 MG tablet Take 25 mg by mouth every 6 (six) hours as needed. 09/04/22   [provider]  levocetirizine (XYZAL) 5 MG tablet Take 2 tablets (10 mg total) by mouth daily. Patient not taking: Reported on 08/13/2022 07/22/22 08/21/22  Alfonse Spruce, MD  metFORMIN (GLUCOPHAGE-XR) 500 MG 24 hr tablet Take 1 tablet (500 mg total) by mouth 2 (two) times daily with a meal. 07/11/22 10/09/22  Jerre Simon, MD  montelukast (SINGULAIR) 10 MG tablet Take 1 tablet (10 mg total) by mouth at bedtime. Patient not taking: Reported on 08/13/2022 07/22/22   Alfonse Spruce, MD  mupirocin ointment (BACTROBAN) 2 % Apply 1 Application topically 2 (two) times daily. Apply to affected area of left foot 10/07/22   Terri Piedra, DO  rosuvastatin (CRESTOR) 10 MG tablet Take 10 mg by mouth daily. 05/06/22   [provider]  Secukinumab, 300 MG Dose, (COSENTYX SENSOREADY, 300 MG,) 150 MG/ML SOAJ Inject 2 mLs (300 mg total) into the skin as directed. On week 0, 1, 2, 3  and 4. 09/03/22   Terri Piedra, DO  Secukinumab, 300 MG Dose, (COSENTYX SENSOREADY, 300 MG,) 150 MG/ML SOAJ Inject 2 mLs (300 mg total) into the skin every 28 (twenty-eight) days. For maintenance. 09/03/22   Terri Piedra, DO  triamcinolone ointment (KENALOG) 0.1 % APPLY TOPICALLY 2 TIMES DAILY AS NEEDED. DO NOT USE ON THE FACE, ARM PITS, OR GROIN. Patient not taking: Reported on 08/13/2022 07/22/22   Alfonse Spruce, MD  triamcinolone ointment (KENALOG) 0.5 % APPLY TO AFFECTED AREA TWICE A DAY 09/05/22   Jerre Simon, MD       Allergies    Patient has no known allergies.    Review of Systems   Review of Systems  Physical Exam Updated Vital Signs BP 132/82 (BP Location: Right Arm)   Pulse 90   Temp 98 F (36.7 C) (Oral)   Resp 18   SpO2 100%  Physical Exam  ED Results / Procedures / Treatments   Labs (all labs ordered are listed, but only abnormal results are displayed) Labs Reviewed  COMPREHENSIVE METABOLIC PANEL - Abnormal; Notable for the following components:      Result Value   Sodium 131 (*)    CO2 18 (*)    Glucose, Bld 149 (*)    Creatinine, Ser 1.24 (*)    Calcium 8.3 (*)    Albumin 2.6 (*)    Alkaline Phosphatase 167 (*)    GFR, Estimated 53 (*)    All other components within normal limits  CBC WITH DIFFERENTIAL/PLATELET - Abnormal; Notable for the following components:   RBC 2.81 (*)    Hemoglobin 8.4 (*)    HCT 26.8 (*)    RDW 16.2 (*)    All other components within normal limits  LACTIC ACID, PLASMA    EKG None  Radiology DG Foot Complete Left  Result Date: 10/28/2022 Please see detailed radiograph report in office note.   Procedures Procedures  {Document cardiac monitor, telemetry assessment procedure when appropriate:1}  Medications Ordered in ED Medications - No data to display  ED Course/ Medical Decision Making/ A&P   {   Click here for ABCD2, HEART and other calculatorsREFRESH Note before signing :1}                          Medical Decision Making Amount and/or Complexity of Data Reviewed Labs: ordered.   ***  {Document critical care time when appropriate:1} {Document review of labs and clinical decision tools ie heart score, Chads2Vasc2 etc:1}  {Document your independent review of radiology images, and any outside records:1} {Document your discussion with family members, caretakers, and with consultants:1} {Document social determinants of health affecting pt's care:1} {Document your decision making why or why not admission, treatments were  needed:1} Final Clinical Impression(s) / ED Diagnoses Final diagnoses:  None    Rx / DC Orders ED Discharge Orders     None

## 2022-10-28 NOTE — H&P (Signed)
Hospital Admission History and Physical Service Pager: [phone]  Patient name: Christine [name] Medical record number: [id number] Date of Birth: [birth year] Age: 52 y.o. Gender: female  Primary Care Provider: Jerre Simon, MD Consultants: Podiatry Code Status: Full Preferred Emergency Contact: Christine [name] [phone]  Chief Complaint: Foot pain, sent by podiatrist for concern for osteomyelitis of diabetic foot wound   Assessment and Plan: Christine [name] is a 52 y.o. female presenting with diabetic foot infection, found to have concern for osteomyelitis on imaging as an outpatient.  Hospital Problem List      Hospital     * (Principal) Osteomyelitis [facility])     Noted on outpatient imaging. In the setting of diabetic foot wound.   There is obvious cortical erosion at the lateral aspect of the proximal  phalanx and metatarsal head of the left foot.  In office ABIs suggestive  of arterial disease which will further complicate her ability to heal any  wound.  A surface aerobic culture of the wound taken about 2 months ago  grew MSSA. -Admit to MedSurg -Podiatry will see while inpatient -MRI foot  -Initiating therapy with CTX and Flagyl, can narrow as able -Consult to WOC for maceration between toes  -Continue gabapentin for neuropathic pain        Diabetes mellitus with neuropathy (HCC)     Last A1c was 6.3% in October of 2023. - Repeat A1c - Trend glucose, if multiple elevated readings, can add SSI  - Continue statin         ANEMIA     Seems this has been a long-standing issue for her. Saw Dr. Arbutus Ped in  April and was recommended to take iron daily. Do not believe she has been  taking this. Hgb 8.4 today, was 10.3 at her last visit with Dr. Arbutus Ped. - Will obtain iron studies        Essential hypertension, benign     Not presently on any antihypertensives.  - Monitor BP, low threshold to add antihypertensive agent         Alcohol use     Tells me she drinks 48oz  of Natural Light daily. Per chart review a hx  of heavy drinking with prior chronic alcoholic pancreatitis.  - CIWA without Ativan  - Thiamine, folate, MVI         Peripheral vascular disease (HCC)     ABI of the right was 0.77, TBI was 0.62. ABI of the left was 0.83, TBI was 0.64. -Patient is presently on a statin       FEN/GI: Carb-modified diet VTE Prophylaxis: Lovenox  Disposition: Med-Surg  History of Present Illness:  Christine [name] is a 52 y.o. female presenting with a worsening left foot diabetic wound.  Had previously been followed by both dermatologist and podiatry.  X-ray at the podiatrist office today showed evidence of osteomyelitis and she was recommended for admission.  She has pain over more or less the whole foot. She denies any fever, chills.  In the ED, lab workup was significant for Cr 1.24, around baseline. WBC WNL at 7.0. Anemic to 8.4. Blood cultures collected.   Review Of Systems: Per HPI with the following additions:  Review of Systems  Constitutional:  Negative for chills and fever.  Musculoskeletal:  Positive for joint pain.  Neurological:  Negative for weakness.  All other systems reviewed and are negative.    Pertinent Past Medical History: DM2 Hx of etOH abuse with alcoholic pancreatitis  Remainder reviewed in history tab.   Pertinent Past Surgical History: Remainder reviewed in history tab.   Pertinent Social History: Tobacco use: Yes- 1/2 ppd Alcohol use: Yes, 48oz beer daily Other Substance use: Hx of MJ use  Lives with boyfriend Christine [name]   Pertinent Family History: Remainder reviewed in history tab.   Important Outpatient Medications: Prescribed Secukinumab.  Remainder reviewed in medication history.   Objective: BP (!) 153/92   Pulse 91   Temp 98 F (36.7 C) (Oral)   Resp 14   SpO2 100%  Exam: General: In good spirits ,well-appearing  Eyes: Anicteric, EOMs intact  ENTM: MMM Neck: Supple, no LAD Cardiovascular:  RRR, no murmur. 1+ DP pulses bilaterally.  Respiratory: Normal WOB on RA, lungs clear throughout  Gastrointestinal: Non-tender, non-distended  MSK: L foot with generalized tenderness, there is a macerated ulcer between the first and second toes. There is no drainage at present. Also with significant callus to the plantar aspect of the first MTP joint.  Derm: Without rash or excoriation Neuro: Distal sensation diminished on bilateral feed   Psych: Mood and affect are appropriate to situation       Labs:  CBC BMET  Recent Labs  Lab 10/28/22 1439  WBC 7.0  HGB 8.4*  HCT 26.8*  PLT 263   Recent Labs  Lab 10/28/22 1439  NA 131*  K 4.2  CL 104  CO2 18*  BUN 12  CREATININE 1.24*  GLUCOSE 149*  CALCIUM 8.3*     EKG:  None performed   Imaging Studies Performed: Radiographic exam LT foot 10/28/2022: Osseous erosion and fragmentation noted around the lateral aspect of the proximal phalanx and metatarsal head left foot.  No identifiable gas within the tissue.  Clinically this correlates directly with the draining sinus and ulcer to the interdigital webspace. IMPRESSION: Acute osteomyelitis left great toe first metatarsophalangeal joint.   Independently reviewed and agree with podiatrist's interpretation.    Christine Amel, MD 10/28/2022, 6:05 PM PGY-2, [place] [facility]  FPTS Intern pager: [phone], text pages welcome Secure chat group [facility] Teaching Service

## 2022-10-28 NOTE — ED Notes (Signed)
Tray ordered.

## 2022-10-28 NOTE — Assessment & Plan Note (Addendum)
Tells me she drinks 48oz of Natural Light daily. Per chart review a hx of heavy drinking with prior chronic alcoholic pancreatitis.  - CIWA without Ativan  - Thiamine, folate, MVI

## 2022-10-28 NOTE — Assessment & Plan Note (Signed)
ABI of the right was 0.77, TBI was 0.62. ABI of the left was 0.83, TBI was 0.64. -Patient is presently on a statin

## 2022-10-28 NOTE — Assessment & Plan Note (Signed)
Not presently on any antihypertensives.  - Monitor BP, low threshold to add antihypertensive agent

## 2022-10-28 NOTE — Assessment & Plan Note (Addendum)
Last A1c was 6.3% in October of 2023. - Repeat A1c - Trend glucose, if multiple elevated readings, can add SSI  - Continue statin

## 2022-10-28 NOTE — ED Triage Notes (Signed)
Pt came in via POV d/t her foot doctor telling her to come into ED after xray's they performed in the office. Pt reports her Lt great toe is black & there is puss coming out of the top of the great toe as well. A/Ox4, denies pain currently, decreased sensation in that toe, denies fevers.

## 2022-10-28 NOTE — Hospital Course (Addendum)
Christine [name] is a 52 y.o. female with history of diabetes, anemia, hypertension, alcohol use who was admitted to the family medicine teaching service at [facility] for diabetic left foot infection and concern for osteomyelitis.  Hospital course is as follows:  Osteomyelitis Patient presented to ED with left diabetic foot infection with concern for underlying osteomyelitis.  No signs of sepsis.  Started empiric antibiotics Rocephin and Flagyl until wound culture returned.  MRI of the left foot confirmed osteomyelitis podiatry consulted for amputation.  ABIs abnormal and vascular consulted for arteriogram. In the course of the admission patient remained stable and without evidence of systemic infection. Rocephin and Flagyl were discontinued and PO Augmentin started instead for good anaerobic coverage in anticipation of surgery. Left great toe amputation completed on 7/6. Podiatry recommends 1 week of Augmentin and follow up outpatient 1 week after discharge. Vascular surgery recommending continuing ASA and Plavix for peripheral vascular disease.   Diabetes mellitus with neuropathy Patient is on metformin outpatient. A1c is 6.2.  10/30/22: Fasting Glucose 220, so restarted home metformin. Blood sugar continued to rise to 354 so started novolog for good glycemic control with upcoming surgery. No further hyperglycemic episodes while hospitalized. She was discharged on home medications.   Anemia This is a longstanding problem for this patient.  Previously thought to be iron deficiency anemia.  During this admission iron studies were WNL, but patient was found to have low B12 (105). Given one time B12 IM injection. Follow up CBC outpatient.   Peripheral vascular disease ABIs abnormal.  Vascular consulted and ordered arteriogram prior to surgery. Arteriogram showed multiple occluded and stenoses vessels but nothing significant to prevent planned toe amputation. Continue ASA and Plavix.    Items for  follow-up Recommend referral to Nephrology for CKD Follow up with Podiatry in 1 week PCP to monitor B12 levels, anemia Patient taking Goodie powders on admission, counsel on discontinuing.  Continue education about alcohol cessation.

## 2022-10-29 ENCOUNTER — Observation Stay (HOSPITAL_COMMUNITY): Payer: 59

## 2022-10-29 ENCOUNTER — Ambulatory Visit: Payer: Medicaid Other | Admitting: Student

## 2022-10-29 DIAGNOSIS — F1010 Alcohol abuse, uncomplicated: Secondary | ICD-10-CM | POA: Diagnosis present

## 2022-10-29 DIAGNOSIS — E538 Deficiency of other specified B group vitamins: Secondary | ICD-10-CM | POA: Diagnosis present

## 2022-10-29 DIAGNOSIS — I70245 Atherosclerosis of native arteries of left leg with ulceration of other part of foot: Secondary | ICD-10-CM | POA: Diagnosis not present

## 2022-10-29 DIAGNOSIS — Z79899 Other long term (current) drug therapy: Secondary | ICD-10-CM | POA: Diagnosis not present

## 2022-10-29 DIAGNOSIS — L97524 Non-pressure chronic ulcer of other part of left foot with necrosis of bone: Secondary | ICD-10-CM | POA: Diagnosis not present

## 2022-10-29 DIAGNOSIS — I7090 Unspecified atherosclerosis: Secondary | ICD-10-CM | POA: Diagnosis not present

## 2022-10-29 DIAGNOSIS — Z23 Encounter for immunization: Secondary | ICD-10-CM | POA: Diagnosis not present

## 2022-10-29 DIAGNOSIS — I70202 Unspecified atherosclerosis of native arteries of extremities, left leg: Secondary | ICD-10-CM | POA: Diagnosis present

## 2022-10-29 DIAGNOSIS — M869 Osteomyelitis, unspecified: Secondary | ICD-10-CM | POA: Diagnosis present

## 2022-10-29 DIAGNOSIS — E11621 Type 2 diabetes mellitus with foot ulcer: Secondary | ICD-10-CM

## 2022-10-29 DIAGNOSIS — M009 Pyogenic arthritis, unspecified: Secondary | ICD-10-CM | POA: Diagnosis present

## 2022-10-29 DIAGNOSIS — F17210 Nicotine dependence, cigarettes, uncomplicated: Secondary | ICD-10-CM | POA: Diagnosis present

## 2022-10-29 DIAGNOSIS — E1140 Type 2 diabetes mellitus with diabetic neuropathy, unspecified: Secondary | ICD-10-CM | POA: Diagnosis present

## 2022-10-29 DIAGNOSIS — E1151 Type 2 diabetes mellitus with diabetic peripheral angiopathy without gangrene: Secondary | ICD-10-CM | POA: Diagnosis present

## 2022-10-29 DIAGNOSIS — Z7984 Long term (current) use of oral hypoglycemic drugs: Secondary | ICD-10-CM | POA: Diagnosis not present

## 2022-10-29 DIAGNOSIS — M868X7 Other osteomyelitis, ankle and foot: Secondary | ICD-10-CM | POA: Diagnosis not present

## 2022-10-29 DIAGNOSIS — L97529 Non-pressure chronic ulcer of other part of left foot with unspecified severity: Secondary | ICD-10-CM | POA: Diagnosis not present

## 2022-10-29 DIAGNOSIS — E1169 Type 2 diabetes mellitus with other specified complication: Secondary | ICD-10-CM | POA: Diagnosis present

## 2022-10-29 DIAGNOSIS — E13621 Other specified diabetes mellitus with foot ulcer: Secondary | ICD-10-CM | POA: Diagnosis not present

## 2022-10-29 DIAGNOSIS — I10 Essential (primary) hypertension: Secondary | ICD-10-CM | POA: Diagnosis present

## 2022-10-29 LAB — BASIC METABOLIC PANEL
Anion gap: 7 (ref 5–15)
BUN: 15 mg/dL (ref 6–20)
CO2: 19 mmol/L — ABNORMAL LOW (ref 22–32)
Calcium: 8.3 mg/dL — ABNORMAL LOW (ref 8.9–10.3)
Chloride: 110 mmol/L (ref 98–111)
Creatinine, Ser: 1.28 mg/dL — ABNORMAL HIGH (ref 0.44–1.00)
GFR, Estimated: 51 mL/min — ABNORMAL LOW (ref >60)
Glucose, Bld: 133 mg/dL — ABNORMAL HIGH (ref 70–99)
Potassium: 4.6 mmol/L (ref 3.5–5.1)
Sodium: 136 mmol/L (ref 135–145)

## 2022-10-29 LAB — CBC
HCT: 25.4 % — ABNORMAL LOW (ref 36.0–46.0)
Hemoglobin: 8.3 g/dL — ABNORMAL LOW (ref 12.0–15.0)
MCH: 30.3 pg (ref 26.0–34.0)
MCHC: 32.7 g/dL (ref 30.0–36.0)
MCV: 92.7 fL (ref 80.0–100.0)
Platelets: 260 10*3/uL (ref 150–400)
RBC: 2.74 MIL/uL — ABNORMAL LOW (ref 3.87–5.11)
RDW: 16.2 % — ABNORMAL HIGH (ref 11.5–15.5)
WBC: 4.8 10*3/uL (ref 4.0–10.5)
nRBC: 0 % (ref 0.0–0.2)

## 2022-10-29 LAB — RETICULOCYTES
Immature Retic Fract: 11.1 % (ref 2.3–15.9)
RBC.: 2.77 MIL/uL — ABNORMAL LOW (ref 3.87–5.11)
Retic Count, Absolute: 30.5 10*3/uL (ref 19.0–186.0)
Retic Ct Pct: 1.1 % (ref 0.4–3.1)

## 2022-10-29 LAB — VAS US ABI WITH/WO TBI
Left ABI: 0.73
Right ABI: 0.79

## 2022-10-29 LAB — IRON AND TIBC
Iron: 42 ug/dL (ref 28–170)
Saturation Ratios: 16 % (ref 10.4–31.8)
TIBC: 256 ug/dL (ref 250–450)
UIBC: 214 ug/dL

## 2022-10-29 LAB — VITAMIN B12: Vitamin B-12: 105 pg/mL — ABNORMAL LOW (ref 180–914)

## 2022-10-29 LAB — FERRITIN: Ferritin: 50 ng/mL (ref 11–307)

## 2022-10-29 LAB — FOLATE: Folate: 28.4 ng/mL (ref >5.9)

## 2022-10-29 MED ORDER — CYANOCOBALAMIN 1000 MCG/ML IJ SOLN
1000.0000 ug | Freq: Once | INTRAMUSCULAR | Status: AC
  Administered 2022-10-29: 1000 ug via INTRAMUSCULAR
  Filled 2022-10-29: qty 1

## 2022-10-29 MED ORDER — GADOBUTROL 1 MMOL/ML IV SOLN
6.0000 mL | Freq: Once | INTRAVENOUS | Status: AC | PRN
  Administered 2022-10-29: 6 mL via INTRAVENOUS

## 2022-10-29 MED ORDER — ACETAMINOPHEN 325 MG PO TABS
650.0000 mg | ORAL_TABLET | Freq: Four times a day (QID) | ORAL | Status: DC | PRN
  Administered 2022-10-29 – 2022-10-31 (×5): 650 mg via ORAL
  Filled 2022-10-29 (×5): qty 2

## 2022-10-29 MED ORDER — SPIRITUS FRUMENTI
1.0000 | Freq: Every day | ORAL | Status: DC
  Administered 2022-10-29 – 2022-11-01 (×4): 1 via ORAL
  Filled 2022-10-29 (×6): qty 1

## 2022-10-29 NOTE — Progress Notes (Signed)
Pt returned to room 5C8C from MRI.  Pt eating breakfast at this time.  Bed in low position; call bell within reach.

## 2022-10-29 NOTE — Assessment & Plan Note (Signed)
Last A1c was 6.3% in October of 2023. - Repeat A1c - Trend glucose, if multiple elevated readings, can add SSI  - Continue statin

## 2022-10-29 NOTE — Consult Note (Signed)
WOC Nurse Consult Note: Reason for Consult:patient with areas of full thickness wounds with eschar to plantar aspects of feet, fissure between 1st and 2nd digit of left foot Wound type: neuropathic, trauma Pressure Injury POA: N/A Measurement:Bedside RN to measure and document measurements on Nursing Flow Sheet with application of first dressings today Wound bed: fissure: pink wound bed surrounded by maceration.  Plantar wounds with eschar Drainage (amount, consistency, odor) small serous to fissure Periwound: as noted above, maceration Dressing procedure/placement/frequency: I have provided guidance for nursing in the care of the foot lesions, including a daily cleanse with soap and water followed by rinse and thorough dry. The dried areas will be painted with a povidone-iodine swabstick and allowed to air dry. The fissure between the first and second digits on the left foot with be dressed with a silver hydrofiber (Aquacel Ag+ Advantage) strip, covered with dry gauze and secured with paper tape.  Patient has been seen by podiatry (Dr. Andrez Grime) and a surgical procedure is likely forthcoming based on radiographic findings of underlying osteomyelitis.   WOC nursing team will not follow, but will remain available to this patient, the nursing and medical teams.  Please re-consult if needed.  Thank you for inviting [place] to participate in this patient's Plan of Care.  Ladona Mow, MSN, RN, CNS, GNP, Leda Min, [employer], [employer] phone:  [phone]

## 2022-10-29 NOTE — Progress Notes (Signed)
Daily Progress Note Intern Pager: [phone]  Patient name: Christine [name] Medical record number: [id number] Date of birth: 09/27/1970 Age: 52 y.o. Gender: female  Primary Care Provider: Jerre Simon, MD Consultants: Podiatry Code Status: FULL   Assessment and Plan: 52 year old female with osteomyelitis. Patient with significant daily alcohol use disorder and history of chronic alcoholic pancreatitis.    Hospital Problem List      Hospital     * (Principal) Osteomyelitis [facility])     Noted on outpatient imaging. In the setting of diabetic foot wound.   There is obvious cortical erosion at the lateral aspect of the proximal  phalanx and metatarsal head of the left foot.  In office ABIs suggestive  of arterial disease which will further complicate her ability to heal any  wound.  A surface aerobic culture of the wound taken about 2 months ago  grew MSSA. -Admit to MedSurg -Podiatry will see while inpatient -MRI foot  -Initiating therapy with CTX and Flagyl, can narrow as able -Consult to WOC for maceration between toes  -Continue gabapentin for neuropathic pain        Diabetes mellitus with neuropathy (HCC)     Last A1c was 6.3% in October of 2023. - Repeat A1c - Trend glucose, if multiple elevated readings, can add SSI  - Continue statin         ANEMIA     Seems this has been a long-standing issue for her. Saw Dr. Arbutus Ped in  April and was recommended to take iron daily. Do not believe she has been  taking this. Hgb 8.4 today, was 10.3 at her last visit with Dr. Arbutus Ped.  Iron studies WNL. Patient is B12 deficient. - Order 1 time IM B12        Essential hypertension, benign     Not presently on any antihypertensives.  - Monitor BP, low threshold to add antihypertensive agent         Alcohol use     Tells me she drinks 48oz of Natural Light daily. Per chart review a hx  of heavy drinking with prior chronic alcoholic pancreatitis.  - CIWA without Ativan  -  Thiamine, folate, MVI  - Orders for 1 beer with dinner to minimize concerns of precipitating  withdrawal        Peripheral vascular disease (HCC)     ABI of the right was 0.77, TBI was 0.62. ABI of the left was 0.83, TBI was 0.64. -Patient is presently on a statin        Diabetic ulcer of left foot (HCC)    FEN/GI: Carb modified diet PPx: Lovenox Dispo:Home pending clinical improvement . Barriers include glycemic control, blood culture results.   Subjective:  Patient is sitting up in bed.  She reports some mild discomfort that is well-managed with gabapentin at this time.  She has no complaints at this time.  Objective: Temp:  [97.6 F (36.4 C)-98.4 F (36.9 C)] 98.4 F (36.9 C) (07/02 0713) Pulse Rate:  [81-96] 82 (07/02 0713) Resp:  [14-18] 18 (07/02 0713) BP: (132-168)/(71-92) 161/85 (07/02 0713) SpO2:  [99 %-100 %] 100 % (07/02 0713) Weight:  [63.5 kg] 63.5 kg (07/01 1826) Physical Exam: General: Alert and oriented, no acute distress Cardiovascular: RRR Respiratory: CTA bilaterally Abdomen: Soft, nondistended, nontender Extremities: Moves all extremities equally, left foot wrapped  Laboratory: Most recent CBC Lab Results  Component Value Date   WBC 4.8 10/29/2022   HGB 8.3 (L) 10/29/2022  HCT 25.4 (L) 10/29/2022   MCV 92.7 10/29/2022   PLT 260 10/29/2022   Most recent BMP    Latest Ref Rng & Units 10/29/2022    4:29 AM  BMP  Glucose 70 - 99 mg/dL 161   BUN 6 - 20 mg/dL 15   Creatinine 0.96 - 1.00 mg/dL 0.45   Sodium 409 - 811 mmol/L 136   Potassium 3.5 - 5.1 mmol/L 4.6   Chloride 98 - 111 mmol/L 110   CO2 22 - 32 mmol/L 19   Calcium 8.9 - 10.3 mg/dL 8.3     Other pertinent labs  B12: 105   Imaging: Left Foot MRI 10/29/22: "IMPRESSION: 1. Soft tissue wound along the plantar aspect of the great toe with surrounding cellulitis. Large first MTP joint effusion with erosive changes along the plantar lateral aspect of the base of the first proximal  phalanx and bone marrow edema throughout the first proximal phalanx and metatarsal head consistent with septic arthritis and osteomyelitis. 2. Bone marrow edema throughout the medial and lateral hallux sesamoids with an erosion of the plantar aspect of the lateral hallux sesamoid consistent with osteomyelitis. 3. A 1.8 x 1 x 2.2 cm complex peripherally enhancing fluid collection along the plantar aspect of the first MTP joint consistent with an abscess extending distally along the plantar lateral aspect of the great toe and between the first webspace with a 11 x 7 mm abscess. Along the plantar medial aspect of the first metatarsal neck there is a 10 x 7 mm abscess deep to the adductor tendon. Edema in the adductor hallucis brevis concerning for myositis. 4. Mild bone marrow edema in the second metatarsal head which may be reactive versus secondary to early osteomyelitis given the adjacent inflammation. 5. Flexor hallucis longus tendon is attenuated at the level of the first proximal phalanx concerning for a high-grade partial versus complete tear."   Cyndia Skeeters, DO 10/29/2022, 12:34 PM  PGY-1, [facility] FPTS Intern pager: [phone], text pages welcome Secure chat group [facility] [address] Teaching Service

## 2022-10-29 NOTE — Progress Notes (Signed)
ABI w/ TBI study completed.   Please see CV Proc for preliminary results.   Raheen Capili, RDMS, RVT  

## 2022-10-29 NOTE — TOC Progression Note (Addendum)
Transition of Care [facility]) - Progression Note    Patient Details  Name: Christine [name] MRN: [id number] Date of Birth: [birth year]  Transition of [facility]) CM/SW Contact  Odeth Bry A [place], [place] Phone Number: 10/29/2022, 3:27 PM  Clinical Narrative:     CSW was informed by nursing that pt wanted to speak with a Child psychotherapist. CSW met with pt at beside. She is from home and has assistance for transportation at discharge.   She stated that she needed information about food stamps. CSW provided  contact information for [place] social services and put resources in pt's AVS.  No other needs identified. TOC will sign off, please consult again if other TOC needs arise.   Expected Discharge Plan: Home/Self Care Barriers to Discharge: Continued Medical Work up  Expected Discharge Plan and Services       Living arrangements for the past 2 months: Single Family Home                                       Social Determinants of Health (SDOH) Interventions SDOH Screenings   Food Insecurity: No Food Insecurity (10/28/2022)  Housing: Patient Declined (10/28/2022)  Transportation Needs: No Transportation Needs (10/28/2022)  Utilities: Not At Risk (10/28/2022)  Depression (PHQ2-9): High Risk (07/09/2022)  Tobacco Use: High Risk (10/28/2022)    Readmission Risk Interventions     No data to display

## 2022-10-29 NOTE — Consult Note (Signed)
Subjective:  Patient ID: Christine [name], female    DOB: 1970/05/16,  MRN: [id number]  Patient with past medical history of DM type 2, alcohol abuse, PVD, HTN and anemia seen at beside today for concern of diabetic foot infection and osteomyelitis. Patient known to our practice and was sent from clinic yesterday by Dr. Logan Bores for worsening wound and concern for osteomyelitis. Patient relates currently no pain in her foot. Does relates some tightness around her ankle.    Past Medical History:  Diagnosis Date   Alcohol abuse    Angio-edema    Cellulitis 11/2016   RIGHT ARM   Chronic pancreatitis (HCC)    Diabetes mellitus    Esophageal candidiasis (HCC) 03/12/2019   Hypertension    Substance abuse [facility])      Past Surgical History:  Procedure Laterality Date   BREAST REDUCTION SURGERY         Latest Ref Rng & Units 10/29/2022    4:29 AM 10/28/2022    2:39 PM 08/05/2022   10:56 AM  CBC  WBC 4.0 - 10.5 K/uL 4.8  7.0  8.5   Hemoglobin 12.0 - 15.0 g/dL 8.3  8.4  40.9   Hematocrit 36.0 - 46.0 % 25.4  26.8  30.4   Platelets 150 - 400 K/uL 260  263  325        Latest Ref Rng & Units 10/29/2022    4:29 AM 10/28/2022    2:39 PM 08/05/2022   10:56 AM  BMP  Glucose 70 - 99 mg/dL 811  914  782   BUN 6 - 20 mg/dL 15  12  12    Creatinine 0.44 - 1.00 mg/dL 9.56  2.13  0.86   Sodium 135 - 145 mmol/L 136  131  137   Potassium 3.5 - 5.1 mmol/L 4.6  4.2  4.3   Chloride 98 - 111 mmol/L 110  104  105   CO2 22 - 32 mmol/L 19  18  23    Calcium 8.9 - 10.3 mg/dL 8.3  8.3  9.2      Objective:   Vitals:   10/29/22 0607 10/29/22 0713  BP: (!) 155/84 (!) 161/85  Pulse: 81 82  Resp: 18 18  Temp: 98.2 F (36.8 C) 98.4 F (36.9 C)  SpO2: 100% 100%    General:AA&O x 3. Normal mood and affect   Vascular: DP and PT non palpable bilateral. Brisk capillary refill to all digits. Pedal hair present   Neruological. Epicritic sensation grossly intact.   Derm: Ulceration noted in the first interspace  with maceration and drainage. No malodor and no purulence. Darkening noted around the medial first MPJ.   MSK: MMT 5/5 in dorsiflexion, plantar flexion, inversion and eversion. Normal joint ROM without pain or crepitus.        Awaiting ABI and MRI.   Left foot radiograph in clinic shows erosive changes at the proximal phalanx of left hallux concerning for osteomyelitis.   Assessment & Plan:  Patient was evaluated and treated and all questions answered.  DX: Left diabetic foot infection with osteomyelitis  Wound care: betadine, DSD  Antibiotics: Continue IV antibiotics currently had ceftriaxone and flagyl.  Discussed with patient diagnosis and treatment options.  Imaging reviewed. Concern for osteomyelitis on X-ray. Awaiting MRI.  Concern for PAD as well and history of diminished ABIs. Awaiting updated ABIs potentially may need vascular consult and possible intervention.  Discussed with patient that ultimately she will need amputation of the left great  toe. Will await ABI results and MRI results. If ABIs diminished and vascular intervention needed will plan for surgery following vascular intervention.   Patient in agreement with plan and all questions answered.  Podiatry will continue to follow.    Louann Sjogren, DPM  Accessible via secure chat for questions or concerns.

## 2022-10-29 NOTE — Consult Note (Addendum)
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  Although she has a palpable left dorsalis pedis pulse, her toe pressure on the left is 48 mmHg suggesting an adequate circulation for healing.  In addition she has dampened monophasic signals in the left foot.  I have recommended arteriography and possible intervention.  Given the holiday Thursday we cannot schedule this until Friday.  I have reviewed the indications for arteriography.  We discussed the potential complications including but not limited to bleeding, arterial injury, arterial thrombosis and renal insufficiency.  We have also discussed the potential complications of angioplasty and stenting.  She is agreeable to proceeding on Friday.  Christine Caraway, MD 3:30 PM   Reason for Consult:  left foot ulcerations Requesting Physician:  Christine [name] MRN #:  [id number]  History of Present Illness: This is a 52 y.o. female who presented to the hospital with diabetic left foot infection and concerns for osteomyelitis.  She has been following with podiatry as she had xray at their office with concerns for osteomyelitis and she was sent to ED for admission.  An aerobic culture was taken about 2 months ago and this grew MSSA.  She was originally seen in our office in 2021 for claudication.  It was recommended she continue with daily walking program, continuing asa/statin.  She was last seen in our office in November 2022 at which time she was having increased claudication with stairs and could walk about 5 minutes on ground level before having to rest.  She was not having any non healing wounds.  She has hx of known bilateral SFA occlusions.    She states that the ulcer on her foot has been there for a couple of months.  She thought it was getting  better but her foot kept getting darker and darker.  She does continue to smoke about 7-8 cigarettes per day.  She has not been taking asa but has been taking her statin.   She has hx of chronic iron deficiency anemia.  Creatinine today is 1.28, which is down from earlier this year.   She does drink 48 oz beer daily and continues to smoke cigarettes. She is currently on CIWA protocol.    The pt is on a statin for cholesterol management.  The pt is not on a daily aspirin.   Other AC:  none The pt is not on medication for hypertension.   The pt is  on medication for diabetes PTA. Tobacco hx:  current  Past Medical History:  Diagnosis Date   Alcohol abuse    Angio-edema    Cellulitis 11/2016   RIGHT ARM   Chronic pancreatitis (HCC)    Diabetes mellitus    Esophageal candidiasis (HCC) 03/12/2019   Hypertension    Substance abuse (HCC)     Past Surgical History:  Procedure Laterality Date   BREAST REDUCTION SURGERY      No Known Allergies  Prior to Admission medications   Medication Sig Start Date End Date Taking? Authorizing Provider  Aspirin-Acetaminophen-Caffeine (GOODY HEADACHE PO) Take 1 Dose by mouth daily as needed (pain).   Yes [provider]  calcipotriene (DOVONOX) 0.005 % cream APPLY TOPICALLY DAILY. APPLY DAILY TO AFFECTED AREAS DAILY X 2 WEEKS THEN START CLOBETASOL Patient taking differently: Apply 1 Application topically 2 (two) times  daily. Apply daily to affected areas daily x 2 weeks then start Clobetasol 09/04/22  Yes Terri Piedra, DO  calcipotriene (DOVONOX) 0.005 % ointment Apply topically 2 (two) times daily. Patient taking differently: Apply 1 application  topically 2 (two) times daily. 09/03/22  Yes Terri Piedra, DO  CLOBETASOL PROPIONATE E 0.05 % emollient cream APPLY TO AFFECTED AREA TWICE A DAY Patient taking differently: Apply 1 Application topically 2 (two) times daily. 09/04/22  Yes Terri Piedra, DO  gabapentin (NEURONTIN) 300 MG  capsule TAKE 1 CAPSULE BY MOUTH THREE TIMES A DAY Strength: 300 mg Patient taking differently: Take 300 mg by mouth 3 (three) times daily. 07/05/22  Yes Jerre Simon, MD  hydrocortisone 2.5 % lotion Apply topically 2 (two) times daily. Place on sites of rash 2 times daily. Patient taking differently: Apply 1 application  topically 2 (two) times daily. Place on sites of rash 2 times daily. 05/14/22  Yes Gerhard Munch, MD  hydrOXYzine (ATARAX) 25 MG tablet Take 25 mg by mouth every 6 (six) hours as needed for anxiety or itching. 09/04/22  Yes [provider]  metFORMIN (GLUCOPHAGE-XR) 500 MG 24 hr tablet Take 1 tablet (500 mg total) by mouth 2 (two) times daily with a meal. Patient taking differently: Take 1,000 mg by mouth 2 (two) times daily with a meal. 07/11/22 10/28/22 Yes Jerre Simon, MD  mupirocin ointment (BACTROBAN) 2 % Apply 1 Application topically 2 (two) times daily. Apply to affected area of left foot 10/07/22  Yes Terri Piedra, DO  rosuvastatin (CRESTOR) 10 MG tablet Take 10 mg by mouth daily. 05/06/22  Yes [provider]  Accu-Chek FastClix Lancets MISC Please use to check blood sugar up to 4 times daily. E11.9 Patient not taking: Reported on 08/05/2022 03/14/22   Moses Manners, MD  blood glucose meter kit and supplies KIT Dispense based on patient and insurance preference. Use up to four times daily as directed. Patient not taking: Reported on 08/05/2022 03/13/22   Vonna Drafts, MD  cetirizine (ZYRTEC) 10 MG tablet Take 1 tablet (10 mg total) by mouth 2 (two) times daily. Patient not taking: Reported on 10/28/2022 06/27/22 07/27/22  Alfonse Spruce, MD  chlorhexidine (HIBICLENS) 4 % external liquid Apply topically daily as needed. Patient not taking: Reported on 10/28/2022 09/03/22   Terri Piedra, DO  famotidine (PEPCID) 40 MG tablet Take 1 tablet (40 mg total) by mouth 2 (two) times daily. Patient not taking: Reported on 10/28/2022 07/22/22 08/21/22  Alfonse Spruce, MD  fexofenadine [facility] ALLERGY) 180 MG tablet Take 2 tablets (360 mg total) by mouth in the morning and at bedtime. Patient not taking: Reported on 08/13/2022 07/22/22 08/21/22  Alfonse Spruce, MD  glucose blood (ACCU-CHEK GUIDE) test strip Please use to check blood sugar up to 4 times daily. E11.9 03/14/22   Moses Manners, MD  levocetirizine (XYZAL) 5 MG tablet Take 2 tablets (10 mg total) by mouth daily. Patient not taking: Reported on 08/13/2022 07/22/22 08/21/22  Alfonse Spruce, MD  montelukast (SINGULAIR) 10 MG tablet Take 1 tablet (10 mg total) by mouth at bedtime. Patient not taking: Reported on 08/13/2022 07/22/22   Alfonse Spruce, MD  Secukinumab, 300 MG Dose, (COSENTYX SENSOREADY, 300 MG,) 150 MG/ML SOAJ Inject 2 mLs (300 mg total) into the skin as directed. On week 0, 1, 2, 3 and 4. Patient not taking: Reported on 10/28/2022 09/03/22   Terri Piedra, DO  Secukinumab, 300  MG Dose, (COSENTYX SENSOREADY, 300 MG,) 150 MG/ML SOAJ Inject 2 mLs (300 mg total) into the skin every 28 (twenty-eight) days. For maintenance. Patient not taking: Reported on 10/28/2022 09/03/22   Terri Piedra, DO  triamcinolone ointment (KENALOG) 0.1 % APPLY TOPICALLY 2 TIMES DAILY AS NEEDED. DO NOT USE ON THE FACE, ARM PITS, OR GROIN. Patient not taking: Reported on 08/13/2022 07/22/22   Alfonse Spruce, MD  triamcinolone ointment (KENALOG) 0.5 % APPLY TO AFFECTED AREA TWICE A DAY Patient not taking: Reported on 10/28/2022 09/05/22   Jerre Simon, MD    Social History   Socioeconomic History   Marital status: Single    Spouse name: Not on file   Number of children: 3   Years of education: Not on file   Highest education level: Not on file  Occupational History   Occupation: personal care assistant  Tobacco Use   Smoking status: Every Day    Packs/day: 1.00    Years: 22.00    Additional pack years: 0.00    Total pack years: 22.00    Types: Cigarettes    Passive  exposure: Current   Smokeless tobacco: Never   Tobacco comments:    working on quitting  Vaping Use   Vaping Use: Never used  Substance and Sexual Activity   Alcohol use: Yes    Alcohol/week: 0.0 standard drinks of alcohol    Comment: 11/2016 " i DRINK LESS THAN i USED TO,BUT i DRINK BEER EVERYDAY   Drug use: Not Currently    Comment: last use two weeks ago   Sexual activity: Not on file  Other Topics Concern   Not on file  Social History Narrative   Single, 2 sons one daughter. She is working as a Engineer, agricultural. She is to drink several beers a day but stopped recently. It is September 2014. 4 caffeinated beverages daily.   Social Determinants of Health   Financial Resource Strain: Not on file  Food Insecurity: No Food Insecurity (10/28/2022)   Hunger Vital Sign    Worried About Running Out of Food in the Last Year: Never true    Ran Out of Food in the Last Year: Never true  Transportation Needs: No Transportation Needs (10/28/2022)   PRAPARE - Administrator, Civil Service (Medical): No    Lack of Transportation (Non-Medical): No  Physical Activity: Not on file  Stress: Not on file  Social Connections: Not on file  Intimate Partner Violence: Not At Risk (10/28/2022)   Humiliation, Afraid, Rape, and Kick questionnaire    Fear of Current or Ex-Partner: No    Emotionally Abused: No    Physically Abused: No    Sexually Abused: No    Family hx is negative for AAA  ROS: [x]  Positive   [ ]  Negative   [ ]  All sytems reviewed and are negative  Cardiac: []  chest pain/pressure []  hx MI []  SOB   Vascular: [x]  pain in legs while walking []  pain in legs at rest []  pain in legs at night [x]  non-healing ulcers []  hx of DVT []  swelling in legs  Pulmonary: []  asthma/wheezing []  home O2  Neurologic: []  hx of CVA []  mini stroke   Hematologic: []  hx of cancer  Endocrine:   [x]  diabetes []  thyroid disease  GI []  GERD [x]  hx pancreatitis  GU: []   CKD/renal failure []  HD--[]  M/W/F or []  T/T/S  Psychiatric: []  anxiety []  depression  Musculoskeletal: []  arthritis []  joint  pain  Integumentary: []  rashes []  ulcers  Constitutional: []  fever  []  chills  Physical Examination  Vitals:   10/29/22 0713 10/29/22 1241  BP: (!) 161/85 (!) 143/76  Pulse: 82 78  Resp: 18 18  Temp: 98.4 F (36.9 C) 98 F (36.7 C)  SpO2: 100% 100%   Body mass index is 20.38 kg/m.  General:  WDWN in NAD Gait: Not observed HENT: WNL, normocephalic Pulmonary: normal non-labored breathing Cardiac: regular, without carotid bruits Abdomen:  soft, NT; aortic pulse is not palpable Skin: without rashes Vascular Exam/Pulses:  Right Left  Radial 2+ (normal) 2+ (normal)  Femoral 2+ (normal) 2+ (normal)  Popliteal Unable to palpate Unable to palpate  DP 2+ (normal) 1+ (weak)  PT Unable to palpate Unable to palpate   Extremities: motor and sensory are in tact     Musculoskeletal: no muscle wasting or atrophy  Neurologic: A&O X 3 Psychiatric:  The pt has Normal affect.   CBC    Component Value Date/Time   WBC 4.8 10/29/2022 0429   RBC 2.74 (L) 10/29/2022 0429   RBC 2.77 (L) 10/29/2022 0429   HGB 8.3 (L) 10/29/2022 0429   HGB 9.2 (L) 06/27/2022 1455   HCT 25.4 (L) 10/29/2022 0429   HCT 29.6 (L) 06/27/2022 1455   PLT 260 10/29/2022 0429   PLT 475 (H) 02/04/2020 1157   MCV 92.7 10/29/2022 0429   MCV 95 06/27/2022 1455   MCH 30.3 10/29/2022 0429   MCHC 32.7 10/29/2022 0429   RDW 16.2 (H) 10/29/2022 0429   RDW 14.2 06/27/2022 1455   LYMPHSABS 1.6 10/28/2022 1439   LYMPHSABS 1.1 06/27/2022 1455   MONOABS 0.6 10/28/2022 1439   EOSABS 0.1 10/28/2022 1439   EOSABS 0.2 06/27/2022 1455   BASOSABS 0.0 10/28/2022 1439   BASOSABS 0.0 06/27/2022 1455    BMET    Component Value Date/Time   NA 136 10/29/2022 0429   NA 140 06/27/2022 1455   K 4.6 10/29/2022 0429   CL 110 10/29/2022 0429   CO2 19 (L) 10/29/2022 0429   GLUCOSE 133 (H)  10/29/2022 0429   BUN 15 10/29/2022 0429   BUN 12 06/27/2022 1455   CREATININE 1.28 (H) 10/29/2022 0429   CREATININE 0.88 08/31/2014 1143   CALCIUM 8.3 (L) 10/29/2022 0429   GFRNONAA 51 (L) 10/29/2022 0429   GFRNONAA 81 10/28/2013 1150   GFRAA 102 02/09/2020 1015   GFRAA >89 10/28/2013 1150      Non-Invasive Vascular Imaging:   ABI/TBI 10/29/2022 Right:  0.79/0.48 toe pressure 78 monophasic waveforms Left:  0.73/0.30 toe pressure 48 dampened monophasic waveforms  ABI/TBI 03/14/2021 Right: 0.83/0.62 toe pressure 103 biphasic waveforms Left: 0.84/0.64 toe pressure 106 biphasic waveforms  ASSESSMENT/PLAN: This is a 52 y.o. female with non healing left foot ulcerations in setting of DM and PAD  -pt has ulceration left foot that has been present for a couple of months and not improving.  She does have a 1+ palpable left DP pulse and 2+ palpable right DP pulse.  Pt will need arteriogram to evaluate blood flow. -discussed importance of smoking cessation and being at risk for limb loss especially in light of her diabetes.  Discussed this also puts her at risk for MI and stroke as well as cancers.  She has cut back to about 7-8 cigarettes per day. -she has been compliant with her statin but she is not taking a baby asa.  Recommend asa 81mg  daily  -Dr. Edilia Bo to evaluate pt and  determine further plan   Doreatha Massed, PA-C [facility] [phone]

## 2022-10-29 NOTE — H&P (View-Only) (Signed)
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  Although she has a palpable left dorsalis pedis pulse, her toe pressure on the left is 48 mmHg suggesting an adequate circulation for healing.  In addition she has dampened monophasic signals in the left foot.  I have recommended arteriography and possible intervention.  Given the holiday Thursday we cannot schedule this until Friday.  I have reviewed the indications for arteriography.  We discussed the potential complications including but not limited to bleeding, arterial injury, arterial thrombosis and renal insufficiency.  We have also discussed the potential complications of angioplasty and stenting.  She is agreeable to proceeding on Friday.  Chris Clarie Camey, MD 3:30 PM   Reason for Consult:  left foot ulcerations Requesting Physician:  Spencer MRN #:  [id number]  History of Present Illness: This is a 51 y.o. female who presented to the hospital with diabetic left foot infection and concerns for osteomyelitis.  She has been following with podiatry as she had xray at their office with concerns for osteomyelitis and she was sent to ED for admission.  An aerobic culture was taken about 2 months ago and this grew MSSA.  She was originally seen in our office in 2021 for claudication.  It was recommended she continue with daily walking program, continuing asa/statin.  She was last seen in our office in November 2022 at which time she was having increased claudication with stairs and could walk about 5 minutes on ground level before having to rest.  She was not having any non healing wounds.  She has hx of known bilateral SFA occlusions.    She states that the ulcer on her foot has been there for a couple of months.  She thought it was getting  better but her foot kept getting darker and darker.  She does continue to smoke about 7-8 cigarettes per day.  She has not been taking asa but has been taking her statin.   She has hx of chronic iron deficiency anemia.  Creatinine today is 1.28, which is down from earlier this year.   She does drink 48 oz beer daily and continues to smoke cigarettes. She is currently on CIWA protocol.    The pt is on a statin for cholesterol management.  The pt is not on a daily aspirin.   Other AC:  none The pt is not on medication for hypertension.   The pt is  on medication for diabetes PTA. Tobacco hx:  current  Past Medical History:  Diagnosis Date   Alcohol abuse    Angio-edema    Cellulitis 11/2016   RIGHT ARM   Chronic pancreatitis (HCC)    Diabetes mellitus    Esophageal candidiasis (HCC) 03/12/2019   Hypertension    Substance abuse (HCC)     Past Surgical History:  Procedure Laterality Date   BREAST REDUCTION SURGERY      No Known Allergies  Prior to Admission medications   Medication Sig Start Date End Date Taking? Authorizing Provider  Aspirin-Acetaminophen-Caffeine (GOODY HEADACHE PO) Take 1 Dose by mouth daily as needed (pain).   Yes [provider]  calcipotriene (DOVONOX) 0.005 % cream APPLY TOPICALLY DAILY. APPLY DAILY TO AFFECTED AREAS DAILY X 2 WEEKS THEN START CLOBETASOL Patient taking differently: Apply 1 Application topically 2 (two) times   daily. Apply daily to affected areas daily x 2 weeks then start Clobetasol 09/04/22  Yes David, Jennifer N, DO  calcipotriene (DOVONOX) 0.005 % ointment Apply topically 2 (two) times daily. Patient taking differently: Apply 1 application  topically 2 (two) times daily. 09/03/22  Yes David, Jennifer N, DO  CLOBETASOL PROPIONATE E 0.05 % emollient cream APPLY TO AFFECTED AREA TWICE A DAY Patient taking differently: Apply 1 Application topically 2 (two) times daily. 09/04/22  Yes David, Jennifer N, DO  gabapentin (NEURONTIN) 300 MG  capsule TAKE 1 CAPSULE BY MOUTH THREE TIMES A DAY Strength: 300 mg Patient taking differently: Take 300 mg by mouth 3 (three) times daily. 07/05/22  Yes Norbert, John, MD  hydrocortisone 2.5 % lotion Apply topically 2 (two) times daily. Place on sites of rash 2 times daily. Patient taking differently: Apply 1 application  topically 2 (two) times daily. Place on sites of rash 2 times daily. 05/14/22  Yes Lockwood, Robert, MD  hydrOXYzine (ATARAX) 25 MG tablet Take 25 mg by mouth every 6 (six) hours as needed for anxiety or itching. 09/04/22  Yes [provider]  metFORMIN (GLUCOPHAGE-XR) 500 MG 24 hr tablet Take 1 tablet (500 mg total) by mouth 2 (two) times daily with a meal. Patient taking differently: Take 1,000 mg by mouth 2 (two) times daily with a meal. 07/11/22 10/28/22 Yes Norbert, John, MD  mupirocin ointment (BACTROBAN) 2 % Apply 1 Application topically 2 (two) times daily. Apply to affected area of left foot 10/07/22  Yes David, Jennifer N, DO  rosuvastatin (CRESTOR) 10 MG tablet Take 10 mg by mouth daily. 05/06/22  Yes [provider]  Accu-Chek FastClix Lancets MISC Please use to check blood sugar up to 4 times daily. E11.9 Patient not taking: Reported on 08/05/2022 03/14/22   Hensel, William A, MD  blood glucose meter kit and supplies KIT Dispense based on patient and insurance preference. Use up to four times daily as directed. Patient not taking: Reported on 08/05/2022 03/13/22   Mahmood, Atif, MD  cetirizine (ZYRTEC) 10 MG tablet Take 1 tablet (10 mg total) by mouth 2 (two) times daily. Patient not taking: Reported on 10/28/2022 06/27/22 07/27/22  [name], Joel Louis, MD  chlorhexidine (HIBICLENS) 4 % external liquid Apply topically daily as needed. Patient not taking: Reported on 10/28/2022 09/03/22   David, Jennifer N, DO  famotidine (PEPCID) 40 MG tablet Take 1 tablet (40 mg total) by mouth 2 (two) times daily. Patient not taking: Reported on 10/28/2022 07/22/22 08/21/22  [name],  Joel Louis, MD  fexofenadine (ALLEGRA ALLERGY) 180 MG tablet Take 2 tablets (360 mg total) by mouth in the morning and at bedtime. Patient not taking: Reported on 08/13/2022 07/22/22 08/21/22  [name], Joel Louis, MD  glucose blood (ACCU-CHEK GUIDE) test strip Please use to check blood sugar up to 4 times daily. E11.9 03/14/22   Hensel, William A, MD  levocetirizine (XYZAL) 5 MG tablet Take 2 tablets (10 mg total) by mouth daily. Patient not taking: Reported on 08/13/2022 07/22/22 08/21/22  [name], Joel Louis, MD  montelukast (SINGULAIR) 10 MG tablet Take 1 tablet (10 mg total) by mouth at bedtime. Patient not taking: Reported on 08/13/2022 07/22/22   [name], Joel Louis, MD  Secukinumab, 300 MG Dose, (COSENTYX SENSOREADY, 300 MG,) 150 MG/ML SOAJ Inject 2 mLs (300 mg total) into the skin as directed. On week 0, 1, 2, 3 and 4. Patient not taking: Reported on 10/28/2022 09/03/22   David, Jennifer N, DO  Secukinumab, 300   MG Dose, (COSENTYX SENSOREADY, 300 MG,) 150 MG/ML SOAJ Inject 2 mLs (300 mg total) into the skin every 28 (twenty-eight) days. For maintenance. Patient not taking: Reported on 10/28/2022 09/03/22   David, Jennifer N, DO  triamcinolone ointment (KENALOG) 0.1 % APPLY TOPICALLY 2 TIMES DAILY AS NEEDED. DO NOT USE ON THE FACE, ARM PITS, OR GROIN. Patient not taking: Reported on 08/13/2022 07/22/22   [name], Joel Louis, MD  triamcinolone ointment (KENALOG) 0.5 % APPLY TO AFFECTED AREA TWICE A DAY Patient not taking: Reported on 10/28/2022 09/05/22   Norbert, John, MD    Social History   Socioeconomic History   Marital status: Single    Spouse name: Not on file   Number of children: 3   Years of education: Not on file   Highest education level: Not on file  Occupational History   Occupation: personal care assistant  Tobacco Use   Smoking status: Every Day    Packs/day: 1.00    Years: 22.00    Additional pack years: 0.00    Total pack years: 22.00    Types: Cigarettes    Passive  exposure: Current   Smokeless tobacco: Never   Tobacco comments:    working on quitting  Vaping Use   Vaping Use: Never used  Substance and Sexual Activity   Alcohol use: Yes    Alcohol/week: 0.0 standard drinks of alcohol    Comment: 11/2016 " i DRINK LESS THAN i USED TO,BUT i DRINK BEER EVERYDAY   Drug use: Not Currently    Comment: last use two weeks ago   Sexual activity: Not on file  Other Topics Concern   Not on file  Social History Narrative   Single, 2 sons one daughter. She is working as a personal care assistant. She is to drink several beers a day but stopped recently. It is September 2014. 4 caffeinated beverages daily.   Social Determinants of Health   Financial Resource Strain: Not on file  Food Insecurity: No Food Insecurity (10/28/2022)   Hunger Vital Sign    Worried About Running Out of Food in the Last Year: Never true    Ran Out of Food in the Last Year: Never true  Transportation Needs: No Transportation Needs (10/28/2022)   PRAPARE - Transportation    Lack of Transportation (Medical): No    Lack of Transportation (Non-Medical): No  Physical Activity: Not on file  Stress: Not on file  Social Connections: Not on file  Intimate Partner Violence: Not At Risk (10/28/2022)   Humiliation, Afraid, Rape, and Kick questionnaire    Fear of Current or Ex-Partner: No    Emotionally Abused: No    Physically Abused: No    Sexually Abused: No    Family hx is negative for AAA  ROS: [x] Positive   [ ] Negative   [ ] All sytems reviewed and are negative  Cardiac: [] chest pain/pressure [] hx MI [] SOB   Vascular: [x] pain in legs while walking [] pain in legs at rest [] pain in legs at night [x] non-healing ulcers [] hx of DVT [] swelling in legs  Pulmonary: [] asthma/wheezing [] home O2  Neurologic: [] hx of CVA [] mini stroke   Hematologic: [] hx of cancer  Endocrine:   [x] diabetes [] thyroid disease  GI [] GERD [x] hx pancreatitis  GU: []  CKD/renal failure [] HD--[] M/W/F or [] T/T/S  Psychiatric: [] anxiety [] depression  Musculoskeletal: [] arthritis [] joint   pain  Integumentary: [] rashes [] ulcers  Constitutional: [] fever  [] chills  Physical Examination  Vitals:   10/29/22 0713 10/29/22 1241  BP: (!) 161/85 (!) 143/76  Pulse: 82 78  Resp: 18 18  Temp: 98.4 F (36.9 C) 98 F (36.7 C)  SpO2: 100% 100%   Body mass index is 20.38 kg/m.  General:  WDWN in NAD Gait: Not observed HENT: WNL, normocephalic Pulmonary: normal non-labored breathing Cardiac: regular, without carotid bruits Abdomen:  soft, NT; aortic pulse is not palpable Skin: without rashes Vascular Exam/Pulses:  Right Left  Radial 2+ (normal) 2+ (normal)  Femoral 2+ (normal) 2+ (normal)  Popliteal Unable to palpate Unable to palpate  DP 2+ (normal) 1+ (weak)  PT Unable to palpate Unable to palpate   Extremities: motor and sensory are in tact     Musculoskeletal: no muscle wasting or atrophy  Neurologic: A&O X 3 Psychiatric:  The pt has Normal affect.   CBC    Component Value Date/Time   WBC 4.8 10/29/2022 0429   RBC 2.74 (L) 10/29/2022 0429   RBC 2.77 (L) 10/29/2022 0429   HGB 8.3 (L) 10/29/2022 0429   HGB 9.2 (L) 06/27/2022 1455   HCT 25.4 (L) 10/29/2022 0429   HCT 29.6 (L) 06/27/2022 1455   PLT 260 10/29/2022 0429   PLT 475 (H) 02/04/2020 1157   MCV 92.7 10/29/2022 0429   MCV 95 06/27/2022 1455   MCH 30.3 10/29/2022 0429   MCHC 32.7 10/29/2022 0429   RDW 16.2 (H) 10/29/2022 0429   RDW 14.2 06/27/2022 1455   LYMPHSABS 1.6 10/28/2022 1439   LYMPHSABS 1.1 06/27/2022 1455   MONOABS 0.6 10/28/2022 1439   EOSABS 0.1 10/28/2022 1439   EOSABS 0.2 06/27/2022 1455   BASOSABS 0.0 10/28/2022 1439   BASOSABS 0.0 06/27/2022 1455    BMET    Component Value Date/Time   NA 136 10/29/2022 0429   NA 140 06/27/2022 1455   K 4.6 10/29/2022 0429   CL 110 10/29/2022 0429   CO2 19 (L) 10/29/2022 0429   GLUCOSE 133 (H)  10/29/2022 0429   BUN 15 10/29/2022 0429   BUN 12 06/27/2022 1455   CREATININE 1.28 (H) 10/29/2022 0429   CREATININE 0.88 08/31/2014 1143   CALCIUM 8.3 (L) 10/29/2022 0429   GFRNONAA 51 (L) 10/29/2022 0429   GFRNONAA 81 10/28/2013 1150   GFRAA 102 02/09/2020 1015   GFRAA >89 10/28/2013 1150      Non-Invasive Vascular Imaging:   ABI/TBI 10/29/2022 Right:  0.79/0.48 toe pressure 78 monophasic waveforms Left:  0.73/0.30 toe pressure 48 dampened monophasic waveforms  ABI/TBI 03/14/2021 Right: 0.83/0.62 toe pressure 103 biphasic waveforms Left: 0.84/0.64 toe pressure 106 biphasic waveforms  ASSESSMENT/PLAN: This is a 51 y.o. female with non healing left foot ulcerations in setting of DM and PAD  -pt has ulceration left foot that has been present for a couple of months and not improving.  She does have a 1+ palpable left DP pulse and 2+ palpable right DP pulse.  Pt will need arteriogram to evaluate blood flow. -discussed importance of smoking cessation and being at risk for limb loss especially in light of her diabetes.  Discussed this also puts her at risk for MI and stroke as well as cancers.  She has cut back to about 7-8 cigarettes per day. -she has been compliant with her statin but she is not taking a baby asa.  Recommend asa 81mg daily  -Dr. Khamya Topp to evaluate pt and   determine further plan   Samantha Rhyne, PA-C [facility] [phone]   

## 2022-10-29 NOTE — Progress Notes (Signed)
Pt returned to room 5C-08C from vascular.  Medications administered without difficulty.  BL Feet washed with soap and warm water, rinsed and patted dry.  Wound care completed by this RN per order.  Pt tolerated well.  Pt sitting in bed comfortable watching TV. Heels floated on pillows. Bed in low position, Call bell and bedside table within reach. Extra linens placed at bedside.

## 2022-10-29 NOTE — Progress Notes (Signed)
Pt A/Ox3; pleasant and cooperative with care.  Denies any pain at this time.  Pt transported from room 5C8C to MRI via w/c accompanied by transporter x1.

## 2022-10-29 NOTE — Assessment & Plan Note (Signed)
Seems this has been a long-standing issue for her. Saw Dr. Arbutus Ped in April and was recommended to take iron daily. Do not believe she has been taking this. Hgb 8.4 today, was 10.3 at her last visit with Dr. Arbutus Ped. Iron studies WNL. Patient is B12 deficient. - Order 1 time IM B12

## 2022-10-29 NOTE — Assessment & Plan Note (Signed)
Tells me she drinks 48oz of Natural Light daily. Per chart review a hx of heavy drinking with prior chronic alcoholic pancreatitis.  - CIWA without Ativan  - Thiamine, folate, MVI  - Orders for 1 beer with dinner to minimize concerns of precipitating withdrawal

## 2022-10-29 NOTE — Progress Notes (Addendum)
Pt transported from room 5C8C to vascular for ABI study via w/c accompanied by transporter x1.

## 2022-10-29 NOTE — Assessment & Plan Note (Signed)
Noted on outpatient imaging. In the setting of diabetic foot wound.  There is obvious cortical erosion at the lateral aspect of the proximal phalanx and metatarsal head of the left foot.  In office ABIs suggestive of arterial disease which will further complicate her ability to heal any wound.  A surface aerobic culture of the wound taken about 2 months ago grew MSSA. -Admit to MedSurg -Podiatry will see while inpatient -MRI foot  -Initiating therapy with CTX and Flagyl, can narrow as able -Consult to WOC for maceration between toes  -Continue gabapentin for neuropathic pain

## 2022-10-29 NOTE — Assessment & Plan Note (Signed)
Not presently on any antihypertensives.  - Monitor BP, low threshold to add antihypertensive agent  

## 2022-10-30 DIAGNOSIS — E13621 Other specified diabetes mellitus with foot ulcer: Secondary | ICD-10-CM

## 2022-10-30 DIAGNOSIS — M869 Osteomyelitis, unspecified: Secondary | ICD-10-CM | POA: Diagnosis not present

## 2022-10-30 DIAGNOSIS — L97529 Non-pressure chronic ulcer of other part of left foot with unspecified severity: Secondary | ICD-10-CM

## 2022-10-30 DIAGNOSIS — I70245 Atherosclerosis of native arteries of left leg with ulceration of other part of foot: Secondary | ICD-10-CM | POA: Diagnosis not present

## 2022-10-30 LAB — BASIC METABOLIC PANEL
Anion gap: 7 (ref 5–15)
BUN: 16 mg/dL (ref 6–20)
CO2: 20 mmol/L — ABNORMAL LOW (ref 22–32)
Calcium: 8.2 mg/dL — ABNORMAL LOW (ref 8.9–10.3)
Chloride: 105 mmol/L (ref 98–111)
Creatinine, Ser: 1.44 mg/dL — ABNORMAL HIGH (ref 0.44–1.00)
GFR, Estimated: 44 mL/min — ABNORMAL LOW (ref >60)
Glucose, Bld: 220 mg/dL — ABNORMAL HIGH (ref 70–99)
Potassium: 4.3 mmol/L (ref 3.5–5.1)
Sodium: 132 mmol/L — ABNORMAL LOW (ref 135–145)

## 2022-10-30 LAB — CULTURE, BLOOD (ROUTINE X 2): Culture: NO GROWTH

## 2022-10-30 MED ORDER — METFORMIN HCL ER 500 MG PO TB24
1000.0000 mg | ORAL_TABLET | Freq: Two times a day (BID) | ORAL | Status: DC
  Administered 2022-10-30 – 2022-10-31 (×3): 1000 mg via ORAL
  Filled 2022-10-30 (×5): qty 2

## 2022-10-30 MED ORDER — AMMONIUM LACTATE 12 % EX LOTN
TOPICAL_LOTION | CUTANEOUS | Status: DC | PRN
  Administered 2022-10-30: 1 via TOPICAL
  Filled 2022-10-30: qty 225

## 2022-10-30 NOTE — Assessment & Plan Note (Signed)
Noted on outpatient imaging. In the setting of diabetic foot wound.  There is obvious cortical erosion at the lateral aspect of the proximal phalanx and metatarsal head of the left foot.  In office ABIs suggestive of arterial disease which will further complicate her ability to heal any wound.  A surface aerobic culture of the wound taken about 2 months ago grew MSSA. -Podiatry will see while inpatient, appreciate recommendations -Initiating therapy with CTX and Flagyl, can narrow as able -Consult to WOC for maceration between toes  -Continue gabapentin for neuropathic pain

## 2022-10-30 NOTE — Assessment & Plan Note (Addendum)
A1c 6.2 on 10/28/22. Glucose 354 this AM.  - order novolog for glycemic control in anticipation of surgery - Continue statin  - Continue home metformin 

## 2022-10-30 NOTE — Assessment & Plan Note (Signed)
There is evidence of osteomyelitis on MRI affecting the left hallux. Patient is scheduled for Angiogram 7/5, with surgery pending further evaluation. -Continue therapy with CTX and Flagyl, can narrow as able -Continue gabapentin for neuropathic pain - appreciate Podiatry recs

## 2022-10-30 NOTE — Progress Notes (Addendum)
Daily Progress Note Intern Pager: [phone]  Patient name: AVELLA RICHMEIER Medical record number: [id number] Date of birth: 1970/10/15 Age: 52 y.o. Gender: female  Primary Care Provider: Jerre Simon, MD Consultants: Podiatry Code Status: FULL  Assessment and Plan: 52 year old female with osteomyelitis. Patient with significant daily alcohol use disorder and history of chronic alcoholic pancreatitis.   Hospital Problem List      Hospital     * (Principal) Osteomyelitis [facility])     There is evidence of osteomyelitis on MRI affecting the left hallux.  Patient is scheduled for Angiogram 7/5, with surgery pending further  evaluation. -Continue therapy with CTX and Flagyl, can narrow as able -Continue gabapentin for neuropathic pain - appreciate Podiatry recs        Diabetes mellitus with neuropathy (HCC)     A1c 6.2 on 10/28/22 - Trend glucose, if multiple elevated readings, can add SSI  - Continue statin  -Restart home metformin        ANEMIA     Seems this has been a long-standing issue for her. Saw Dr. Arbutus Ped in  April and was recommended to take iron daily. Do not believe she has been  taking this. Hgb 8.4 today, was 10.3 at her last visit with Dr. Arbutus Ped.  Iron studies WNL. Patient is B12 deficient. - Given 1 time IM B12        Essential hypertension, benign     Not presently on any antihypertensives.  - Monitor BP, low threshold to add antihypertensive agent         Alcohol use     Tells me she drinks 48oz of Natural Light daily. Per chart review a hx  of heavy drinking with prior chronic alcoholic pancreatitis.  - CIWA without Ativan  - Thiamine, folate, MVI  - Orders for 1 beer with dinner to minimize concerns of precipitating  withdrawal        Peripheral vascular disease (HCC)     ABI of the right was 0.77, TBI was 0.62. ABI of the left was 0.83, TBI was 0.64. -Patient is presently on a statin - arteriogram planned for 7/5        Diabetic ulcer of  left foot (HCC)    FEN/GI: Carb modified diet PPx: Lovenox Dispo:Home pending clinical improvement . Barriers include glycemic control, blood culture results.   Subjective:  Patient seen today lying in bed.  She is in good spirits and reports no concerns overnight.  She did receive her beer with dinner last night.  Objective: Temp:  [97.9 F (36.6 C)-99.1 F (37.3 C)] 98.3 F (36.8 C) (07/03 0957) Pulse Rate:  [74-84] 83 (07/03 0957) Resp:  [18] 18 (07/03 0957) BP: (125-143)/(76-85) 134/79 (07/03 0957) SpO2:  [97 %-100 %] 97 % (07/03 0957) Physical Exam: General: Well-appearing, no acute distress Cardio: RRR, no murmurs on exam. Pulm: Clear, no wheezing, no crackles. No increased work of breathing. Extremities: Left foot wrapped Abdominal: bowel sounds present, soft, non-tender, non-distended Neuro: alert and oriented x3, speech normal in content. Psych:  Cognition and judgment appear intact. Alert, communicative  and cooperative with normal attention span and concentration. No apparent delusions, illusions, hallucinations    Laboratory: Most recent CBC Lab Results  Component Value Date   WBC 4.8 10/29/2022   HGB 8.3 (L) 10/29/2022   HCT 25.4 (L) 10/29/2022   MCV 92.7 10/29/2022   PLT 260 10/29/2022   Most recent BMP    Latest Ref Rng & Units 10/30/2022  12:37 AM  BMP  Glucose 70 - 99 mg/dL 161   BUN 6 - 20 mg/dL 16   Creatinine 0.96 - 1.00 mg/dL 0.45   Sodium 409 - 811 mmol/L 132   Potassium 3.5 - 5.1 mmol/L 4.3   Chloride 98 - 111 mmol/L 105   CO2 22 - 32 mmol/L 20   Calcium 8.9 - 10.3 mg/dL 8.2      Cyndia Skeeters, DO 10/30/2022, 12:52 PM  PGY-1, [facility] FPTS Intern pager: [phone], text pages welcome Secure chat group [facility] Teaching Service

## 2022-10-30 NOTE — Assessment & Plan Note (Signed)
Tells me she drinks 48oz of Natural Light daily. Per chart review a hx of heavy drinking with prior chronic alcoholic pancreatitis. Multiple CIWAs of 0, so discontinued CIWA scores. - Thiamine, folate, MVI  - Orders for 1 beer with dinner to minimize concerns of precipitating withdrawal

## 2022-10-30 NOTE — Assessment & Plan Note (Signed)
Seems this has been a long-standing issue for her. Saw Dr. Arbutus Ped in April and was recommended to take iron daily. Do not believe she has been taking this. Hgb 8.4 today, was 10.3 at her last visit with Dr. Arbutus Ped. Iron studies WNL. Patient is B12 deficient. - Given 1 time IM B12

## 2022-10-30 NOTE — Assessment & Plan Note (Signed)
A1c 6.2 on 10/28/22. Glucose 354 this AM.  - order novolog for glycemic control in anticipation of surgery - Continue statin  - Continue home metformin

## 2022-10-30 NOTE — Assessment & Plan Note (Signed)
ABI of the right was 0.77, TBI was 0.62. ABI of the left was 0.83, TBI was 0.64. -Patient is presently on a statin - arteriogram planned for 7/5 

## 2022-10-30 NOTE — Assessment & Plan Note (Signed)
Not presently on any antihypertensives.  - Monitor BP, low threshold to add antihypertensive agent  

## 2022-10-30 NOTE — Assessment & Plan Note (Signed)
Seems this has been a long-standing issue for her. Saw Dr. Mohamed in April and was recommended to take iron daily. Do not believe she has been taking this. Hgb 8.4 today, was 10.3 at her last visit with Dr. Mohamed. Iron studies WNL. Patient is B12 deficient. - Given 1 time IM B12 

## 2022-10-30 NOTE — Assessment & Plan Note (Signed)
Not presently on any antihypertensives.  - Monitor BP, low threshold to add antihypertensive agent

## 2022-10-30 NOTE — Progress Notes (Signed)
Subjective:  Patient ID: Christine [name], female    DOB: 12-09-1970,  MRN: [id number]  Patient seen at bedside this morning. Doing well. Plan for vascular intervention Friday 7/5.  Past Medical History:  Diagnosis Date   Alcohol abuse    Angio-edema    Cellulitis 11/2016   RIGHT ARM   Chronic pancreatitis (HCC)    Diabetes mellitus    Esophageal candidiasis (HCC) 03/12/2019   Hypertension    Substance abuse [facility])      Past Surgical History:  Procedure Laterality Date   BREAST REDUCTION SURGERY         Latest Ref Rng & Units 10/29/2022    4:29 AM 10/28/2022    2:39 PM 08/05/2022   10:56 AM  CBC  WBC 4.0 - 10.5 K/uL 4.8  7.0  8.5   Hemoglobin 12.0 - 15.0 g/dL 8.3  8.4  40.9   Hematocrit 36.0 - 46.0 % 25.4  26.8  30.4   Platelets 150 - 400 K/uL 260  263  325        Latest Ref Rng & Units 10/30/2022   12:37 AM 10/29/2022    4:29 AM 10/28/2022    2:39 PM  BMP  Glucose 70 - 99 mg/dL 811  914  782   BUN 6 - 20 mg/dL 16  15  12    Creatinine 0.44 - 1.00 mg/dL 9.56  2.13  0.86   Sodium 135 - 145 mmol/L 132  136  131   Potassium 3.5 - 5.1 mmol/L 4.3  4.6  4.2   Chloride 98 - 111 mmol/L 105  110  104   CO2 22 - 32 mmol/L 20  19  18    Calcium 8.9 - 10.3 mg/dL 8.2  8.3  8.3      Objective:   Vitals:   10/30/22 0345 10/30/22 0717  BP: 136/76 (!) 143/85  Pulse: 78 74  Resp: 18 18  Temp: 98.6 F (37 C) 98.8 F (37.1 C)  SpO2: 100% 98%     General:AA&O x 3. Normal mood and affect    Vascular: DP and PT non palpable bilateral. Brisk capillary refill to all digits. Pedal hair present    Neruological. Epicritic sensation grossly intact.    Derm: Ulceration noted in the first interspace with maceration and drainage. No malodor and no purulence. Darkening noted around the medial first MPJ.    MSK: MMT 5/5 in dorsiflexion, plantar flexion, inversion and eversion. Normal joint ROM without pain or crepitus.    ABI  ABI Findings:   +---------+------------------+-----+----------+--------+  Right   Rt Pressure (mmHg)IndexWaveform  Comment   +---------+------------------+-----+----------+--------+  Brachial 160                    triphasic           +---------+------------------+-----+----------+--------+  PTA     110               0.68 monophasic          +---------+------------------+-----+----------+--------+  DP      127               0.79 monophasic          +---------+------------------+-----+----------+--------+  Great Toe78                0.48 Abnormal            +---------+------------------+-----+----------+--------+   +---------+------------------+-----+-------------------+-------+  Left    Lt Pressure (mmHg)IndexWaveform  Comment  +---------+------------------+-----+-------------------+-------+  Brachial 161                    triphasic                   +---------+------------------+-----+-------------------+-------+  PTA     117               0.73 dampened monophasic         +---------+------------------+-----+-------------------+-------+  DP      113               0.70 dampened monophasic         +---------+------------------+-----+-------------------+-------+  Great Toe48                0.30 Abnormal                    +---------+------------------+-----+-------------------+-------+   +-------+-----------+-----------+------------+------------+  ABI/TBIToday's ABIToday's TBIPrevious ABIPrevious TBI  +-------+-----------+-----------+------------+------------+  Right 0.79       0.48       0.83        0.62          +-------+-----------+-----------+------------+------------+  Left  0.73       0.30       0.84        0.64          +-------+-----------+-----------+------------+------------+       Right ABIs appear mildly decreased. Left ABIs appear mildly decreased  compared to prior study on 03-14-2021.     Summary:  Right: Resting right ankle-brachial index indicates moderate right lower  extremity arterial disease. The right toe-brachial index is abnormal.   Left: Resting left ankle-brachial index indicates moderate left lower  extremity arterial disease. The left toe-brachial index is abnormal.   MRI  IMPRESSION: 1. Soft tissue wound along the plantar aspect of the great toe with surrounding cellulitis. Large first MTP joint effusion with erosive changes along the plantar lateral aspect of the base of the first proximal phalanx and bone marrow edema throughout the first proximal phalanx and metatarsal head consistent with septic arthritis and osteomyelitis. 2. Bone marrow edema throughout the medial and lateral hallux sesamoids with an erosion of the plantar aspect of the lateral hallux sesamoid consistent with osteomyelitis. 3. A 1.8 x 1 x 2.2 cm complex peripherally enhancing fluid collection along the plantar aspect of the first MTP joint consistent with an abscess extending distally along the plantar lateral aspect of the great toe and between the first webspace with a 11 x 7 mm abscess. Along the plantar medial aspect of the first metatarsal neck there is a 10 x 7 mm abscess deep to the adductor tendon. Edema in the adductor hallucis brevis concerning for myositis. 4. Mild bone marrow edema in the second metatarsal head which may be reactive versus secondary to early osteomyelitis given the adjacent inflammation. 5. Flexor hallucis longus tendon is attenuated at the level of the first proximal phalanx concerning for a high-grade partial versus complete tear.  Assessment & Plan:  Patient was evaluated and treated and all questions answered.  DX: Left diabetic foot infection with osteomyelitis  Wound care: betadine, DSD changed daily.  Antibiotics: Continue IV antibiotics currently had ceftriaxone and flagyl.  Discussed with patient diagnosis and treatment options.   Imaging reviewed. Concern for osteomyelitis on X-ray. MRI with osteomyeltiis of hallux and first metatarsal.   Patient seen by vascular appreciated recommendations and will be going for angiography on Friday with possible intervention.  Discussed  with patient that ultimately she will need amputation of the left first partial ray.  Patient appears stable currently and will plan for amputation following vascular intervention.  Patient in agreement with plan and all questions answered.  Podiatry will continue to follow.   Louann Sjogren, DPM  Accessible via secure chat for questions or concerns.

## 2022-10-30 NOTE — Assessment & Plan Note (Signed)
Tells me she drinks 48oz of Natural Light daily. Per chart review a hx of heavy drinking with prior chronic alcoholic pancreatitis.  - CIWA without Ativan  - Thiamine, folate, MVI  - Orders for 1 beer with dinner to minimize concerns of precipitating withdrawal 

## 2022-10-30 NOTE — Progress Notes (Signed)
  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  Her toe pressure is 48 mmHg.  For this reason I recommended arteriography which is scheduled for Friday.  I discussed the indications for the procedure and the potential complications and she is agreeable to proceed.  All of her questions were answered.  LEFT GREAT TOE WOUND WITH OSTEOMYELITIS: Her MRI shows evidence of septic arthritis and osteomyelitis of the first metatarsal head.  Podiatry is following.  SUBJECTIVE:   No complaints this morning.  PHYSICAL EXAM:   Vitals:   10/29/22 2317 10/30/22 0345 10/30/22 0717 10/30/22 0957  BP: 127/77 136/76 (!) 143/85 134/79  Pulse: 83 78 74 83  Resp: 18 18 18 18   Temp: 98.7 F (37.1 C) 98.6 F (37 C) 98.8 F (37.1 C) 98.3 F (36.8 C)  TempSrc: Oral Oral Oral   SpO2: 100% 100% 98% 97%  Weight:      Height:       No change in the left foot wound.  LABS:   Lab Results  Component Value Date   WBC 4.8 10/29/2022   HGB 8.3 (L) 10/29/2022   HCT 25.4 (L) 10/29/2022   MCV 92.7 10/29/2022   PLT 260 10/29/2022   Lab Results  Component Value Date   CREATININE 1.44 (H) 10/30/2022   PROBLEM LIST:    Principal Problem:   Osteomyelitis (HCC) Active Problems:   Diabetes mellitus with neuropathy (HCC)   ANEMIA   Essential hypertension, benign   Alcohol use   Peripheral vascular disease (HCC)   Diabetic ulcer of left foot (HCC)   CURRENT MEDS:    enoxaparin (LOVENOX) injection  40 mg Subcutaneous Q24H   folic acid  1 mg Oral Daily   gabapentin  300 mg Oral TID   metroNIDAZOLE  500 mg Oral Q12H   multivitamin with minerals  1 tablet Oral Daily   rosuvastatin  10 mg Oral Daily   spiritus frumenti  1 each Oral QHS   thiamine  100 mg Oral Daily    Waverly Ferrari Office: [phone] 10/30/2022

## 2022-10-31 DIAGNOSIS — E13621 Other specified diabetes mellitus with foot ulcer: Secondary | ICD-10-CM | POA: Diagnosis not present

## 2022-10-31 DIAGNOSIS — M869 Osteomyelitis, unspecified: Secondary | ICD-10-CM | POA: Diagnosis not present

## 2022-10-31 DIAGNOSIS — L97529 Non-pressure chronic ulcer of other part of left foot with unspecified severity: Secondary | ICD-10-CM | POA: Diagnosis not present

## 2022-10-31 LAB — GLUCOSE, CAPILLARY
Glucose-Capillary: 195 mg/dL — ABNORMAL HIGH (ref 70–99)
Glucose-Capillary: 241 mg/dL — ABNORMAL HIGH (ref 70–99)
Glucose-Capillary: 274 mg/dL — ABNORMAL HIGH (ref 70–99)

## 2022-10-31 LAB — BASIC METABOLIC PANEL
Anion gap: 9 (ref 5–15)
BUN: 19 mg/dL (ref 6–20)
CO2: 18 mmol/L — ABNORMAL LOW (ref 22–32)
Calcium: 8.4 mg/dL — ABNORMAL LOW (ref 8.9–10.3)
Chloride: 103 mmol/L (ref 98–111)
Creatinine, Ser: 1.45 mg/dL — ABNORMAL HIGH (ref 0.44–1.00)
GFR, Estimated: 44 mL/min — ABNORMAL LOW (ref >60)
Glucose, Bld: 354 mg/dL — ABNORMAL HIGH (ref 70–99)
Potassium: 4.1 mmol/L (ref 3.5–5.1)
Sodium: 130 mmol/L — ABNORMAL LOW (ref 135–145)

## 2022-10-31 MED ORDER — AMOXICILLIN-POT CLAVULANATE 875-125 MG PO TABS
1.0000 | ORAL_TABLET | Freq: Two times a day (BID) | ORAL | Status: DC
  Administered 2022-10-31 – 2022-11-02 (×5): 1 via ORAL
  Filled 2022-10-31 (×6): qty 1

## 2022-10-31 MED ORDER — INSULIN ASPART 100 UNIT/ML IJ SOLN
0.0000 [IU] | Freq: Three times a day (TID) | INTRAMUSCULAR | Status: DC
  Administered 2022-10-31: 3 [IU] via SUBCUTANEOUS
  Administered 2022-10-31: 5 [IU] via SUBCUTANEOUS
  Administered 2022-11-01: 2 [IU] via SUBCUTANEOUS
  Administered 2022-11-01: 8 [IU] via SUBCUTANEOUS
  Administered 2022-11-02: 3 [IU] via SUBCUTANEOUS

## 2022-10-31 NOTE — Assessment & Plan Note (Addendum)
There is evidence of osteomyelitis on MRI affecting the left hallux. -Angiogram today, with surgery pending further evaluation. -continue PO Augmentin (7/4 - ) -Continue gabapentin for neuropathic pain - appreciate Podiatry recs

## 2022-10-31 NOTE — Assessment & Plan Note (Signed)
ABI of the right was 0.77, TBI was 0.62. ABI of the left was 0.83, TBI was 0.64. -Patient is presently on a statin - arteriogram planned for 7/5

## 2022-10-31 NOTE — Progress Notes (Addendum)
Daily Progress Note Intern Pager: [phone]  Patient name: Christine [name] Medical record number: [id number] Date of birth: 1971-04-16 Age: 52 y.o. Gender: female  Primary Care Provider: Jerre Simon, MD Consultants: Podiatry Code Status: Full   Assessment and Plan: 52 year old female with osteomyelitis. Patient with significant daily alcohol use disorder and history of chronic alcoholic pancreatitis.     Hospital Problem List      Hospital     * (Principal) Osteomyelitis [facility])     There is evidence of osteomyelitis on MRI affecting the left hallux.  Patient is scheduled for Angiogram 7/5, with surgery pending further  evaluation. -Continue therapy with CTX and Flagyl (7/1 - ), can narrow as able -Continue gabapentin for neuropathic pain - appreciate Podiatry recs        Diabetes mellitus with neuropathy (HCC)     A1c 6.2 on 10/28/22. Glucose 354 this AM.  - order novolog for glycemic control in anticipation of surgery - Continue statin  - Continue home metformin        ANEMIA     Seems this has been a long-standing issue for her. Saw Dr. Arbutus Ped in  April and was recommended to take iron daily. Do not believe she has been  taking this. Hgb 8.4 today, was 10.3 at her last visit with Dr. Arbutus Ped.  Iron studies WNL. Patient is B12 deficient. - Given 1 time IM B12        Essential hypertension, benign     Not presently on any antihypertensives.  - Monitor BP, low threshold to add antihypertensive agent         Alcohol use     Tells me she drinks 48oz of Natural Light daily. Per chart review a hx  of heavy drinking with prior chronic alcoholic pancreatitis. Multiple  CIWAs of 0, so discontinued CIWA scores. - Thiamine, folate, MVI  - Orders for 1 beer with dinner to minimize concerns of precipitating  withdrawal        Peripheral vascular disease (HCC)     ABI of the right was 0.77, TBI was 0.62. ABI of the left was 0.83, TBI was 0.64. -Patient is presently on  a statin - arteriogram planned for 7/5        Diabetic ulcer of left foot (HCC)    FEN/GI: Carb modified diet PPx: Lovenox Dispo: Home pending clinical improvement.  Barriers include glycemic control, blood culture results.  Subjective:  Patient seen this morning in room.  She is very pleasant and states she is doing well and has been up ambulating.  She expects to have company today.  She does not have pain and denies any other concerns at this time.  Objective: Temp:  [97.7 F (36.5 C)-99.3 F (37.4 C)] 97.7 F (36.5 C) (07/04 0335) Pulse Rate:  [70-83] 70 (07/04 0736) Resp:  [17-18] 17 (07/04 0736) BP: (134-158)/(79-91) 154/85 (07/04 0736) SpO2:  [97 %-100 %] 100 % (07/04 0736) Physical Exam: General: Well-appearing, no acute distress Cardio: RRR Pulm: CTA bilaterally, no increased work of breathing Abdominal: soft, non-tender Extremities: no peripheral edema, left foot wrapped Neuro: alert and oriented x3, speech normal in content Psych:  Cognition and judgment appear intact. Alert, communicative  and cooperative.  Laboratory: Most recent CBC Lab Results  Component Value Date   WBC 4.8 10/29/2022   HGB 8.3 (L) 10/29/2022   HCT 25.4 (L) 10/29/2022   MCV 92.7 10/29/2022   PLT 260 10/29/2022   Most recent BMP  Latest Ref Rng & Units 10/31/2022   12:34 AM  BMP  Glucose 70 - 99 mg/dL 657   BUN 6 - 20 mg/dL 19   Creatinine 8.46 - 1.00 mg/dL 9.62   Sodium 952 - 841 mmol/L 130   Potassium 3.5 - 5.1 mmol/L 4.1   Chloride 98 - 111 mmol/L 103   CO2 22 - 32 mmol/L 18   Calcium 8.9 - 10.3 mg/dL 8.4      Cyndia Skeeters, DO 10/31/2022, 9:40 AM  PGY-1, [facility] FPTS Intern pager: [phone], text pages welcome Secure chat group [facility] Teaching Service

## 2022-10-31 NOTE — Progress Notes (Signed)
  Progress Note    10/31/2022 11:59 AM * No surgery found *  Subjective: No overnight issues  Vitals:   10/31/22 0335 10/31/22 0736  BP: (!) 144/81 (!) 154/85  Pulse: 76 70  Resp: 18 17  Temp: 97.7 F (36.5 C)   SpO2: 100% 100%    Physical Exam: Awake alert and oriented Nonlabored respirations   CBC    Component Value Date/Time   WBC 4.8 10/29/2022 0429   RBC 2.74 (L) 10/29/2022 0429   RBC 2.77 (L) 10/29/2022 0429   HGB 8.3 (L) 10/29/2022 0429   HGB 9.2 (L) 06/27/2022 1455   HCT 25.4 (L) 10/29/2022 0429   HCT 29.6 (L) 06/27/2022 1455   PLT 260 10/29/2022 0429   PLT 475 (H) 02/04/2020 1157   MCV 92.7 10/29/2022 0429   MCV 95 06/27/2022 1455   MCH 30.3 10/29/2022 0429   MCHC 32.7 10/29/2022 0429   RDW 16.2 (H) 10/29/2022 0429   RDW 14.2 06/27/2022 1455   LYMPHSABS 1.6 10/28/2022 1439   LYMPHSABS 1.1 06/27/2022 1455   MONOABS 0.6 10/28/2022 1439   EOSABS 0.1 10/28/2022 1439   EOSABS 0.2 06/27/2022 1455   BASOSABS 0.0 10/28/2022 1439   BASOSABS 0.0 06/27/2022 1455    BMET    Component Value Date/Time   NA 130 (L) 10/31/2022 0034   NA 140 06/27/2022 1455   K 4.1 10/31/2022 0034   CL 103 10/31/2022 0034   CO2 18 (L) 10/31/2022 0034   GLUCOSE 354 (H) 10/31/2022 0034   BUN 19 10/31/2022 0034   BUN 12 06/27/2022 1455   CREATININE 1.45 (H) 10/31/2022 0034   CREATININE 0.88 08/31/2014 1143   CALCIUM 8.4 (L) 10/31/2022 0034   GFRNONAA 44 (L) 10/31/2022 0034   GFRNONAA 81 10/28/2013 1150   GFRAA 102 02/09/2020 1015   GFRAA >89 10/28/2013 1150    INR No results found for: "INR"  No intake or output data in the 24 hours ending 10/31/22 1159   Assessment:  52 y.o. female is here with chronic left lower extremity limb threatening ischemia with first metatarsal wound and dampened monophasic waveforms with toe pressure of 48.  Plan: N.p.o. past midnight for procedure tomorrow with Dr. Rayann Heman C. Randie Heinz, MD [facility] Office: [phone] Pager: [phone]  10/31/2022 11:59 AM

## 2022-10-31 NOTE — Plan of Care (Signed)
Patient alert/oriented X4. Patient compliant with medication administration and ambulated around halls independently. Patient properly covered with insulin administration and was up in chair for the majority of the shift. Patient signed consent for arteriogram and knows to remain NPO past midnight. Oncoming shift will be notified. VSS, will continue to monitor.   Problem: Education: Goal: Knowledge of General Education information will improve Description: Including pain rating scale, medication(s)/side effects and non-pharmacologic comfort measures Outcome: Progressing   Problem: Health Behavior/Discharge Planning: Goal: Ability to manage health-related needs will improve Outcome: Progressing   Problem: Clinical Measurements: Goal: Ability to maintain clinical measurements within normal limits will improve Outcome: Progressing   Problem: Clinical Measurements: Goal: Will remain free from infection Outcome: Progressing   Problem: Clinical Measurements: Goal: Diagnostic test results will improve Outcome: Progressing   Problem: Clinical Measurements: Goal: Respiratory complications will improve Outcome: Progressing   Problem: Clinical Measurements: Goal: Cardiovascular complication will be avoided Outcome: Progressing   Problem: Activity: Goal: Risk for activity intolerance will decrease Outcome: Progressing   Problem: Nutrition: Goal: Adequate nutrition will be maintained Outcome: Progressing   Problem: Coping: Goal: Level of anxiety will decrease Outcome: Progressing   Problem: Elimination: Goal: Will not experience complications related to bowel motility Outcome: Progressing   Problem: Elimination: Goal: Will not experience complications related to urinary retention Outcome: Progressing   Problem: Pain Managment: Goal: General experience of comfort will improve Outcome: Progressing   Problem: Safety: Goal: Ability to remain free from injury will  improve Outcome: Progressing   Problem: Skin Integrity: Goal: Risk for impaired skin integrity will decrease Outcome: Progressing   Problem: Education: Goal: Ability to describe self-care measures that may prevent or decrease complications (Diabetes Survival Skills Education) will improve Outcome: Progressing   Problem: Education: Goal: Individualized Educational Video(s) Outcome: Progressing   Problem: Coping: Goal: Ability to adjust to condition or change in health will improve Outcome: Progressing   Problem: Fluid Volume: Goal: Ability to maintain a balanced intake and output will improve Outcome: Progressing   Problem: Health Behavior/Discharge Planning: Goal: Ability to identify and utilize available resources and services will improve Outcome: Progressing   Problem: Health Behavior/Discharge Planning: Goal: Ability to manage health-related needs will improve Outcome: Progressing   Problem: Metabolic: Goal: Ability to maintain appropriate glucose levels will improve Outcome: Progressing   Problem: Nutritional: Goal: Maintenance of adequate nutrition will improve Outcome: Progressing   Problem: Nutritional: Goal: Progress toward achieving an optimal weight will improve Outcome: Progressing   Problem: Skin Integrity: Goal: Risk for impaired skin integrity will decrease Outcome: Progressing   Problem: Tissue Perfusion: Goal: Adequacy of tissue perfusion will improve Outcome: Progressing

## 2022-11-01 ENCOUNTER — Ambulatory Visit (HOSPITAL_COMMUNITY): Admission: RE | Admit: 2022-11-01 | Payer: Medicaid Other | Source: Home / Self Care | Admitting: Surgery

## 2022-11-01 ENCOUNTER — Encounter (HOSPITAL_COMMUNITY)
Admission: EM | Disposition: A | Discharge status: Home / Self Care | Payer: Self-pay | Source: Home / Self Care | Attending: Family Medicine

## 2022-11-01 ENCOUNTER — Encounter (HOSPITAL_COMMUNITY): Payer: Self-pay | Admitting: Student

## 2022-11-01 DIAGNOSIS — L97529 Non-pressure chronic ulcer of other part of left foot with unspecified severity: Secondary | ICD-10-CM | POA: Diagnosis not present

## 2022-11-01 DIAGNOSIS — E13621 Other specified diabetes mellitus with foot ulcer: Secondary | ICD-10-CM | POA: Diagnosis not present

## 2022-11-01 DIAGNOSIS — I70245 Atherosclerosis of native arteries of left leg with ulceration of other part of foot: Secondary | ICD-10-CM | POA: Diagnosis not present

## 2022-11-01 DIAGNOSIS — M869 Osteomyelitis, unspecified: Secondary | ICD-10-CM | POA: Diagnosis not present

## 2022-11-01 HISTORY — PX: ABDOMINAL AORTOGRAM W/LOWER EXTREMITY: CATH118223

## 2022-11-01 LAB — CBC
HCT: 24.9 % — ABNORMAL LOW (ref 36.0–46.0)
HCT: 28.1 % — ABNORMAL LOW (ref 36.0–46.0)
Hemoglobin: 7.9 g/dL — ABNORMAL LOW (ref 12.0–15.0)
Hemoglobin: 8.8 g/dL — ABNORMAL LOW (ref 12.0–15.0)
MCH: 29.8 pg (ref 26.0–34.0)
MCH: 29.7 pg (ref 26.0–34.0)
MCHC: 31.7 g/dL (ref 30.0–36.0)
MCHC: 31.3 g/dL (ref 30.0–36.0)
MCV: 94 fL (ref 80.0–100.0)
MCV: 94.9 fL (ref 80.0–100.0)
Platelets: 274 10*3/uL (ref 150–400)
Platelets: 273 10*3/uL (ref 150–400)
RBC: 2.65 MIL/uL — ABNORMAL LOW (ref 3.87–5.11)
RBC: 2.96 MIL/uL — ABNORMAL LOW (ref 3.87–5.11)
RDW: 16.1 % — ABNORMAL HIGH (ref 11.5–15.5)
RDW: 16.3 % — ABNORMAL HIGH (ref 11.5–15.5)
WBC: 6.6 10*3/uL (ref 4.0–10.5)
WBC: 7.6 10*3/uL (ref 4.0–10.5)
nRBC: 0 % (ref 0.0–0.2)
nRBC: 0 % (ref 0.0–0.2)

## 2022-11-01 LAB — BASIC METABOLIC PANEL
Anion gap: 9 (ref 5–15)
BUN: 17 mg/dL (ref 6–20)
CO2: 19 mmol/L — ABNORMAL LOW (ref 22–32)
Calcium: 8.8 mg/dL — ABNORMAL LOW (ref 8.9–10.3)
Chloride: 104 mmol/L (ref 98–111)
Creatinine, Ser: 1.34 mg/dL — ABNORMAL HIGH (ref 0.44–1.00)
GFR, Estimated: 48 mL/min — ABNORMAL LOW (ref >60)
Glucose, Bld: 252 mg/dL — ABNORMAL HIGH (ref 70–99)
Potassium: 4.6 mmol/L (ref 3.5–5.1)
Sodium: 132 mmol/L — ABNORMAL LOW (ref 135–145)

## 2022-11-01 LAB — GLUCOSE, CAPILLARY
Glucose-Capillary: 138 mg/dL — ABNORMAL HIGH (ref 70–99)
Glucose-Capillary: 72 mg/dL (ref 70–99)
Glucose-Capillary: 219 mg/dL — ABNORMAL HIGH (ref 70–99)
Glucose-Capillary: 130 mg/dL — ABNORMAL HIGH (ref 70–99)

## 2022-11-01 LAB — POCT ACTIVATED CLOTTING TIME: Activated Clotting Time: 220 seconds

## 2022-11-01 SURGERY — ABDOMINAL AORTOGRAM W/LOWER EXTREMITY
Anesthesia: LOCAL

## 2022-11-01 MED ORDER — MIDAZOLAM HCL 2 MG/2ML IJ SOLN
INTRAMUSCULAR | Status: AC
  Filled 2022-11-01: qty 2

## 2022-11-01 MED ORDER — ACETAMINOPHEN 325 MG PO TABS: 650.0000 mg | ORAL_TABLET | ORAL | Status: DC | PRN

## 2022-11-01 MED ORDER — HYDRALAZINE HCL 20 MG/ML IJ SOLN: 5.0000 mg | INTRAMUSCULAR | Status: DC | PRN

## 2022-11-01 MED ORDER — FENTANYL CITRATE (PF) 100 MCG/2ML IJ SOLN
INTRAMUSCULAR | Status: AC
  Filled 2022-11-01: qty 2

## 2022-11-01 MED ORDER — HEPARIN SODIUM (PORCINE) 1000 UNIT/ML IJ SOLN
INTRAMUSCULAR | Status: DC | PRN
  Administered 2022-11-01: 6500 [IU] via INTRAVENOUS
  Administered 2022-11-01: 2000 [IU] via INTRAVENOUS

## 2022-11-01 MED ORDER — LIDOCAINE HCL (PF) 1 % IJ SOLN
INTRAMUSCULAR | Status: AC
  Filled 2022-11-01: qty 30

## 2022-11-01 MED ORDER — LIDOCAINE HCL (PF) 1 % IJ SOLN
INTRAMUSCULAR | Status: DC | PRN
  Administered 2022-11-01: 2 mL

## 2022-11-01 MED ORDER — HYDRALAZINE HCL 20 MG/ML IJ SOLN
INTRAMUSCULAR | Status: DC | PRN
  Administered 2022-11-01 (×2): 10 mg via INTRAVENOUS

## 2022-11-01 MED ORDER — LABETALOL HCL 5 MG/ML IV SOLN
INTRAVENOUS | Status: AC
  Filled 2022-11-01: qty 4

## 2022-11-01 MED ORDER — HEPARIN (PORCINE) IN NACL 1000-0.9 UT/500ML-% IV SOLN
INTRAVENOUS | Status: DC | PRN
  Administered 2022-11-01 (×2): 500 mL

## 2022-11-01 MED ORDER — OXYCODONE HCL 5 MG PO TABS
5.0000 mg | ORAL_TABLET | ORAL | Status: DC | PRN
  Administered 2022-11-01 – 2022-11-02 (×2): 5 mg via ORAL
  Filled 2022-11-01 (×2): qty 1

## 2022-11-01 MED ORDER — SODIUM CHLORIDE 0.9 % WEIGHT BASED INFUSION
1.0000 mL/kg/h | INTRAVENOUS | Status: AC
  Administered 2022-11-01: 1 mL/kg/h via INTRAVENOUS

## 2022-11-01 MED ORDER — LABETALOL HCL 5 MG/ML IV SOLN
INTRAVENOUS | Status: DC | PRN
  Administered 2022-11-01: 10 mg via INTRAVENOUS

## 2022-11-01 MED ORDER — ASPIRIN 81 MG PO CHEW
CHEWABLE_TABLET | ORAL | Status: DC | PRN
  Administered 2022-11-01: 81 mg via ORAL

## 2022-11-01 MED ORDER — IODIXANOL 320 MG/ML IV SOLN
INTRAVENOUS | Status: DC | PRN
  Administered 2022-11-01: 105 mL via INTRAVENOUS

## 2022-11-01 MED ORDER — CLOPIDOGREL BISULFATE 75 MG PO TABS: 300.0000 mg | ORAL_TABLET | Freq: Once | ORAL | Status: DC

## 2022-11-01 MED ORDER — SODIUM CHLORIDE 0.9% FLUSH
3.0000 mL | Freq: Two times a day (BID) | INTRAVENOUS | Status: DC
  Administered 2022-11-01 – 2022-11-02 (×2): 3 mL via INTRAVENOUS

## 2022-11-01 MED ORDER — SODIUM CHLORIDE 0.9% FLUSH: 3.0000 mL | INTRAVENOUS | Status: DC | PRN

## 2022-11-01 MED ORDER — SODIUM CHLORIDE 0.9 % IV SOLN: 250.0000 mL | INTRAVENOUS | Status: DC | PRN

## 2022-11-01 MED ORDER — LABETALOL HCL 5 MG/ML IV SOLN: 10.0000 mg | INTRAVENOUS | Status: DC | PRN

## 2022-11-01 MED ORDER — CLOPIDOGREL BISULFATE 300 MG PO TABS
ORAL_TABLET | ORAL | Status: AC
  Filled 2022-11-01: qty 1

## 2022-11-01 MED ORDER — MIDAZOLAM HCL 2 MG/2ML IJ SOLN
INTRAMUSCULAR | Status: DC | PRN
  Administered 2022-11-01: 2 mg via INTRAVENOUS
  Administered 2022-11-01: 1 mg via INTRAVENOUS

## 2022-11-01 MED ORDER — CLOPIDOGREL BISULFATE 75 MG PO TABS
75.0000 mg | ORAL_TABLET | Freq: Every day | ORAL | Status: DC
  Administered 2022-11-02: 75 mg via ORAL
  Filled 2022-11-01: qty 1

## 2022-11-01 MED ORDER — ONDANSETRON HCL 4 MG/2ML IJ SOLN: 4.0000 mg | Freq: Four times a day (QID) | INTRAMUSCULAR | Status: DC | PRN

## 2022-11-01 MED ORDER — SODIUM CHLORIDE 0.9 % IV SOLN: INTRAVENOUS | Status: DC

## 2022-11-01 MED ORDER — FENTANYL CITRATE (PF) 100 MCG/2ML IJ SOLN
INTRAMUSCULAR | Status: DC | PRN
  Administered 2022-11-01: 25 ug via INTRAVENOUS
  Administered 2022-11-01: 50 ug via INTRAVENOUS

## 2022-11-01 MED ORDER — ASPIRIN 81 MG PO CHEW
CHEWABLE_TABLET | ORAL | Status: AC
  Filled 2022-11-01: qty 1

## 2022-11-01 MED ORDER — CLOPIDOGREL BISULFATE 300 MG PO TABS
ORAL_TABLET | ORAL | Status: DC | PRN
  Administered 2022-11-01: 300 mg via ORAL

## 2022-11-01 MED ORDER — MORPHINE SULFATE (PF) 2 MG/ML IV SOLN
2.0000 mg | INTRAVENOUS | Status: DC | PRN
  Administered 2022-11-01: 2 mg via INTRAVENOUS
  Filled 2022-11-01: qty 1

## 2022-11-01 MED ORDER — VITAMIN B-12 1000 MCG PO TABS
1000.0000 ug | ORAL_TABLET | Freq: Every day | ORAL | Status: DC
  Administered 2022-11-01 – 2022-11-02 (×2): 1000 ug via ORAL
  Filled 2022-11-01 (×2): qty 1

## 2022-11-01 MED ORDER — HYDRALAZINE HCL 20 MG/ML IJ SOLN
INTRAMUSCULAR | Status: AC
  Filled 2022-11-01: qty 1

## 2022-11-01 MED ORDER — CLOPIDOGREL BISULFATE 75 MG PO TABS: 75.0000 mg | ORAL_TABLET | Freq: Every day | ORAL | Status: DC

## 2022-11-01 MED ORDER — ASPIRIN 81 MG PO TBEC
81.0000 mg | DELAYED_RELEASE_TABLET | Freq: Every day | ORAL | Status: DC
  Administered 2022-11-02: 81 mg via ORAL
  Filled 2022-11-01: qty 1

## 2022-11-01 SURGICAL SUPPLY — 27 items
BALLN MUSTANG 4X150X135 (BALLOONS) ×1
BALLN MUSTANG 5X150X135 (BALLOONS) ×1
BALLOON MUSTANG 4X150X135 (BALLOONS) IMPLANT
BALLOON MUSTANG 5X150X135 (BALLOONS) IMPLANT
CATH OMNI FLUSH 5F 65CM (CATHETERS) IMPLANT
CATH QUICKCROSS SUPP .035X90CM (MICROCATHETER) IMPLANT
DEVICE TORQUE SEADRAGON GRN (MISCELLANEOUS) IMPLANT
DEVICE VASC CLSR CELT ART 6 (Vascular Products) IMPLANT
GUIDEWIRE ANGLED .035X150CM (WIRE) IMPLANT
KIT ENCORE 26 ADVANTAGE (KITS) IMPLANT
KIT MICROPUNCTURE NIT STIFF (SHEATH) IMPLANT
KIT PV (KITS) ×1 IMPLANT
SHEATH CATAPULT 6FR 45 (SHEATH) IMPLANT
SHEATH PINNACLE 5F 10CM (SHEATH) IMPLANT
SHEATH PINNACLE 6F 10CM (SHEATH) IMPLANT
SHEATH PROBE COVER 6X72 (BAG) IMPLANT
STENT ELUVIA 6X100X130 (Permanent Stent) IMPLANT
STENT ELUVIA 6X150X130 (Permanent Stent) IMPLANT
STENT ELUVIA 6X40X130 (Permanent Stent) IMPLANT
STOPCOCK MORSE 400PSI 3WAY (MISCELLANEOUS) IMPLANT
SYR MEDRAD MARK 7 150ML (SYRINGE) ×1 IMPLANT
TRANSDUCER W/STOPCOCK (MISCELLANEOUS) ×1 IMPLANT
TRAY PV CATH (CUSTOM PROCEDURE TRAY) ×1 IMPLANT
TUBING CIL FLEX 10 FLL-RA (TUBING) IMPLANT
WIRE BENTSON .035X145CM (WIRE) IMPLANT
WIRE HI TORQ VERSACORE 300 (WIRE) IMPLANT
WIRE SPARTACORE .014X300CM (WIRE) IMPLANT

## 2022-11-01 NOTE — Progress Notes (Signed)
Daily Progress Note Intern Pager: [phone]  Patient name: Christine [name] Medical record number: [id number] Date of birth: [birth year] Age: 52 y.o. Gender: female  Primary Care Provider: Jerre Simon, MD Consultants: Podiatry Code Status: Full   Assessment and Plan: 52 year old female with osteomyelitis. Patient with significant daily alcohol use disorder and history of chronic alcoholic pancreatitis.   Patient n.p.o. since midnight in anticipation of arteriogram this a.m.    Hospital Problem List      Hospital     * (Principal) Osteomyelitis [facility])     There is evidence of osteomyelitis on MRI affecting the left hallux. -Angiogram today, with surgery pending further evaluation. -continue PO Augmentin (7/4 - ) -Continue gabapentin for neuropathic pain - appreciate Podiatry recs        Diabetes mellitus with neuropathy (HCC)     A1c 6.2 on 10/28/22. Glucose 252 this AM. - continue novolog for glycemic control in anticipation of surgery - Continue statin  - Continue home metformin        ANEMIA     Seems this has been a long-standing issue for her. Iron studies WNL.  Patient is B12 deficient. Given 1 time IM B12.        Essential hypertension, benign     Not presently on any antihypertensives.  - Monitor BP, low threshold to add antihypertensive agent         Alcohol use     Tells me she drinks 48oz of Natural Light daily. Per chart review a hx  of heavy drinking with prior chronic alcoholic pancreatitis. Multiple  CIWAs of 0, so discontinued CIWA scores. - Thiamine, folate, MVI  - Orders for 1 beer with dinner to minimize concerns of precipitating  withdrawal        Peripheral vascular disease (HCC)     ABI of the right was 0.77, TBI was 0.62. ABI of the left was 0.83, TBI was 0.64. -Patient is presently on a statin - arteriogram today        Diabetic ulcer of left foot (HCC)    FEN/GI: Carb modified diet PPx: Lovenox Dispo: Home pending clinical  improvement.  Barriers include glycemic control, blood culture results  Subjective:  Patient seen in room today up and ambulating.  She is in good spirits and states she feels well.  She had visitors yesterday.  Without complaint.  Objective: Temp:  [97.6 F (36.4 C)-99 F (37.2 C)] 98.4 F (36.9 C) (07/05 0710) Pulse Rate:  [75-99] 75 (07/05 0710) Resp:  [16-18] 16 (07/05 0710) BP: (138-162)/(77-85) 162/85 (07/05 0710) SpO2:  [100 %] 100 % (07/05 0710) Physical Exam: General: well-appearing, no acute distress Cardio: RRR, no murmurs on exam. Pulm: CTA bilaterally. No increased work of breathing Abdominal: soft, non-tender, non-distended Extremities: no peripheral edema.  Left foot wrapped. Neuro: alert and oriented x3, speech normal in content. Psych:  Cognition and judgment appear intact. Alert, communicative  and cooperative.  Laboratory: Most recent CBC Lab Results  Component Value Date   WBC 6.6 11/01/2022   HGB 7.9 (L) 11/01/2022   HCT 24.9 (L) 11/01/2022   MCV 94.0 11/01/2022   PLT 274 11/01/2022   Most recent BMP    Latest Ref Rng & Units 11/01/2022    3:39 AM  BMP  Glucose 70 - 99 mg/dL 629   BUN 6 - 20 mg/dL 17   Creatinine 5.28 - 1.00 mg/dL 4.13   Sodium 244 - 010 mmol/L 132   Potassium 3.5 -  5.1 mmol/L 4.6   Chloride 98 - 111 mmol/L 104   CO2 22 - 32 mmol/L 19   Calcium 8.9 - 10.3 mg/dL 8.8      Cyndia Skeeters, DO 11/01/2022, 8:43 AM  PGY-1, [facility] FPTS Intern pager: [phone], text pages welcome Secure chat group [facility] Teaching Service

## 2022-11-01 NOTE — Assessment & Plan Note (Signed)
Tells me she drinks 48oz of Natural Light daily. Per chart review a hx of heavy drinking with prior chronic alcoholic pancreatitis. Multiple CIWAs of 0, so discontinued CIWA scores. - Thiamine, folate, MVI  - Orders for 1 beer with dinner to minimize concerns of precipitating withdrawal 

## 2022-11-01 NOTE — Op Note (Signed)
Patient name: Christine [name] MRN: [id number] DOB: 10-Apr-1971 Sex: female  11/01/2022 Pre-operative Diagnosis: Left foot ulcer Post-operative diagnosis:  Same Surgeon:  [name] Procedure Performed:  1.  Ultrasound-guided access, right femoral artery  2.  Aortobifemoral angiogram  3.  Left leg angiogram  4.  Selective injection with catheter in the left popliteal artery  5.  Stent, left superficial femoral and popliteal artery  6.  Conscious sedation, 101 minutes  7.  Closure device, Celt   Indications: This is a 52 year old female with limb threatening ischemia and ulceration to the left foot.  She comes in today for angiogram and possible intervention  Procedure:  The patient was identified in the holding area and taken to room 8.  The patient was then placed supine on the table and prepped and draped in the usual sterile fashion.  A time out was called.  Conscious sedation was administered with the use of IV fentanyl and Versed under continuous physician and nurse monitoring.  Heart rate, blood pressure, and oxygen saturation were continuously monitored.  Total sedation time was 101 minutes.  Ultrasound was used to evaluate the right common femoral artery.  It was patent .  A digital ultrasound image was acquired.  A micropuncture needle was used to access the right common femoral artery under ultrasound guidance.  An 018 wire was advanced without resistance and a micropuncture sheath was placed.  The 018 wire was removed and a benson wire was placed.  The micropuncture sheath was exchanged for a 5 french sheath.  An omniflush catheter was advanced over the wire to the level of L-1.  An abdominal angiogram was obtained.  Next, using the omniflush catheter and a benson wire, the aortic bifurcation was crossed and the catheter was placed into theleft external iliac artery and left runoff was obtained.    Findings:   Aortogram: No significant Renal Artery Stenosis Was Visualized.  The  Infrarenal Abdominal Aorta Is Widely Patent.  Bilateral Common and external iliac arteries are widely patent.  Bilateral common femoral arteries widely patent.  Right Lower Extremity: Not evaluated due to contrast utilization for left leg intervention  Left Lower Extremity: Left common femoral and profundofemoral artery are widely patent.  There is a flush occlusion of the left superficial femoral artery.  There is reconstitution at the adductor canal.  There is approximately 50% stenosis which is calcified in the below-knee popliteal artery with three-vessel runoff  Intervention: After the above images were acquired the decision was made to proceed with intervention.  A 6 French 45 cm sheath was inserted.  The patient was fully heparinized.  Using an 035 Glidewire and quick cross catheter, subintimal recanalization was performed with telemetry in the above-knee popliteal artery which was confirmed with contrast injection with a catheter at this level.  A versa core wire was then placed.  Next the subintimal tract was dilated with a 4 mm balloon.  I then placed overlapping 6 mm Eluvia stents.  The stents were postdilated with a 5 mm balloon and completion imaging was performed.  There still appeared to be irregular lumen at the distal extent of the stents and so I elected to extend this with a 6 x 40 Eluvia postdilated with a 5 mm balloon.  Patient now has inline flow through the superficial femoral artery.  There is slight encroachment on the profundofemoral artery from her SFA stent which was necessary to treat the ostial component.  There is also about a 50%  lesion in the below-knee popliteal artery.  I elected to not address this currently because she has excellent flow to the foot.  She send wires were exchanged out for short 6 and a Celt was used for closure.  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  I elected not to address this at this time because she had Perfusion to the foot.  Potentially this could be treated at a later date if necessary from an endovascular approach.  #3  Three-vessel runoff with filling of the plantar arch on the left  #4  No significant aortoiliac occlusive disease    V. [name], M.D., [facility] [place] Office: [phone] Pager:  [phone]

## 2022-11-01 NOTE — Assessment & Plan Note (Signed)
Not presently on any antihypertensives.  - Monitor BP, low threshold to add antihypertensive agent  

## 2022-11-01 NOTE — Assessment & Plan Note (Signed)
A1c 6.2 on 10/28/22. Glucose 252 this AM. - continue novolog for glycemic control in anticipation of surgery - Continue statin  - Continue home metformin

## 2022-11-01 NOTE — Progress Notes (Addendum)
Subjective:  Patient ID: Christine [name], female    DOB: 12/24/1970,  MRN: [id number]  Patient seen at bedside this morning. Doing well. Plan for vascular intervention today.   Past Medical History:  Diagnosis Date   Alcohol abuse    Angio-edema    Cellulitis 11/2016   RIGHT ARM   Chronic pancreatitis (HCC)    Diabetes mellitus    Esophageal candidiasis (HCC) 03/12/2019   Hypertension    Substance abuse (HCC)      Past Surgical History:  Procedure Laterality Date   BREAST REDUCTION SURGERY         Latest Ref Rng & Units 11/01/2022    3:39 AM 10/29/2022    4:29 AM 10/28/2022    2:39 PM  CBC  WBC 4.0 - 10.5 K/uL 6.6  4.8  7.0   Hemoglobin 12.0 - 15.0 g/dL 7.9  8.3  8.4   Hematocrit 36.0 - 46.0 % 24.9  25.4  26.8   Platelets 150 - 400 K/uL 274  260  263        Latest Ref Rng & Units 11/01/2022    3:39 AM 10/31/2022   12:34 AM 10/30/2022   12:37 AM  BMP  Glucose 70 - 99 mg/dL 252  354  220   BUN 6 - 20 mg/dL 17  19  16   Creatinine 0.44 - 1.00 mg/dL 1.34  1.45  1.44   Sodium 135 - 145 mmol/L 132  130  132   Potassium 3.5 - 5.1 mmol/L 4.6  4.1  4.3   Chloride 98 - 111 mmol/L 104  103  105   CO2 22 - 32 mmol/L 19  18  20   Calcium 8.9 - 10.3 mg/dL 8.8  8.4  8.2      Objective:   Vitals:   11/01/22 0307 11/01/22 0710  BP: 138/77 (!) 162/85  Pulse: 77 75  Resp: 18 16  Temp: 97.7 F (36.5 C) 98.4 F (36.9 C)  SpO2: 100% 100%     General:AA&O x 3. Normal mood and affect    Vascular: DP and PT non palpable bilateral. Brisk capillary refill to all digits. Pedal hair present    Neruological. Epicritic sensation grossly intact.    Derm: Ulceration noted in the first interspace with maceration and drainage. No malodor and no purulence. Darkening noted around the medial first MPJ.    MSK: MMT 5/5 in dorsiflexion, plantar flexion, inversion and eversion. Normal joint ROM without pain or crepitus.    ABI  ABI Findings:   +---------+------------------+-----+----------+--------+  Right   Rt Pressure (mmHg)IndexWaveform  Comment   +---------+------------------+-----+----------+--------+  Brachial 160                    triphasic           +---------+------------------+-----+----------+--------+  PTA     110               0.68 monophasic          +---------+------------------+-----+----------+--------+  DP      127               0.79 monophasic          +---------+------------------+-----+----------+--------+  Great Toe78                0.48 Abnormal            +---------+------------------+-----+----------+--------+   +---------+------------------+-----+-------------------+-------+  Left    Lt Pressure (mmHg)IndexWaveform             Comment  +---------+------------------+-----+-------------------+-------+  Brachial 161                    triphasic                   +---------+------------------+-----+-------------------+-------+  PTA     117               0.73 dampened monophasic         +---------+------------------+-----+-------------------+-------+  DP      113               0.70 dampened monophasic         +---------+------------------+-----+-------------------+-------+  Great Toe48                0.30 Abnormal                    +---------+------------------+-----+-------------------+-------+   +-------+-----------+-----------+------------+------------+  ABI/TBIToday's ABIToday's TBIPrevious ABIPrevious TBI  +-------+-----------+-----------+------------+------------+  Right 0.79       0.48       0.83        0.62          +-------+-----------+-----------+------------+------------+  Left  0.73       0.30       0.84        0.64          +-------+-----------+-----------+------------+------------+       Right ABIs appear mildly decreased. Left ABIs appear mildly decreased  compared to prior study on 03-14-2021.     Summary:  Right: Resting right ankle-brachial index indicates moderate right lower  extremity arterial disease. The right toe-brachial index is abnormal.   Left: Resting left ankle-brachial index indicates moderate left lower  extremity arterial disease. The left toe-brachial index is abnormal.   MRI  IMPRESSION: 1. Soft tissue wound along the plantar aspect of the great toe with surrounding cellulitis. Large first MTP joint effusion with erosive changes along the plantar lateral aspect of the base of the first proximal phalanx and bone marrow edema throughout the first proximal phalanx and metatarsal head consistent with septic arthritis and osteomyelitis. 2. Bone marrow edema throughout the medial and lateral hallux sesamoids with an erosion of the plantar aspect of the lateral hallux sesamoid consistent with osteomyelitis. 3. A 1.8 x 1 x 2.2 cm complex peripherally enhancing fluid collection along the plantar aspect of the first MTP joint consistent with an abscess extending distally along the plantar lateral aspect of the great toe and between the first webspace with a 11 x 7 mm abscess. Along the plantar medial aspect of the first metatarsal neck there is a 10 x 7 mm abscess deep to the adductor tendon. Edema in the adductor hallucis brevis concerning for myositis. 4. Mild bone marrow edema in the second metatarsal head which may be reactive versus secondary to early osteomyelitis given the adjacent inflammation. 5. Flexor hallucis longus tendon is attenuated at the level of the first proximal phalanx concerning for a high-grade partial versus complete tear.  Assessment & Plan:  Patient was evaluated and treated and all questions answered.  DX: Left diabetic foot infection with osteomyelitis  Wound care: betadine, DSD changed daily.  Antibiotics: Continue IV antibiotics currently had ceftriaxone and flagyl.  Discussed with patient diagnosis and treatment options.   Imaging reviewed. Concern for osteomyelitis on X-ray. MRI with osteomyeltiis of hallux and first metatarsal.   Patient seen by vascular appreciated recommendations and will be going for angiography today.  Discussed with patient as long   as everything goes well with vascular can plan for toe amputation tomorrow morning. Patient in agreement with plan.  Discussed with patient that ultimately she will need amputation of the left first partial ray.   Patient in agreement with plan and all questions answered.  NPO after midnight tonight.   Emmali Karow, DPM  Accessible via secure chat for questions or concerns. 

## 2022-11-01 NOTE — Progress Notes (Signed)
   11/01/22 1337  Spiritual Encounters  Type of Visit Initial  Care provided to: Patient  Referral source Nurse (RN/NT/LPN)  Reason for visit Advance directives  OnCall Visit No  Advance Directives (For Healthcare)  Does Patient Have a Medical Advance Directive? No  Would patient like information on creating a medical advance directive? Yes (Inpatient - patient requests chaplain consult to create a medical advance directive)   Chaplain provided advance directives (HCPOA/Living Will) education. Chaplain left AD paperwork with patient. Chaplain informed patient to let staff know if/when she would like to complete and notarize paperwork.   [name], Chaplain Resident [phone]

## 2022-11-01 NOTE — Plan of Care (Signed)
Patient alert/oriented X4. Patient compliant with medication administration with sips of water. Patient remained NPO since midnight. VSS, patient belongings packed up at bedside. Consent signed.   Problem: Education: Goal: Knowledge of General Education information will improve Description: Including pain rating scale, medication(s)/side effects and non-pharmacologic comfort measures Outcome: Progressing   Problem: Health Behavior/Discharge Planning: Goal: Ability to manage health-related needs will improve Outcome: Progressing   Problem: Clinical Measurements: Goal: Ability to maintain clinical measurements within normal limits will improve Outcome: Progressing   Problem: Clinical Measurements: Goal: Will remain free from infection Outcome: Progressing   Problem: Clinical Measurements: Goal: Diagnostic test results will improve Outcome: Progressing   Problem: Clinical Measurements: Goal: Respiratory complications will improve Outcome: Progressing   Problem: Clinical Measurements: Goal: Cardiovascular complication will be avoided Outcome: Progressing   Problem: Activity: Goal: Risk for activity intolerance will decrease Outcome: Progressing   Problem: Nutrition: Goal: Adequate nutrition will be maintained Outcome: Progressing   Problem: Coping: Goal: Level of anxiety will decrease Outcome: Progressing   Problem: Elimination: Goal: Will not experience complications related to bowel motility Outcome: Progressing   Problem: Elimination: Goal: Will not experience complications related to urinary retention Outcome: Progressing   Problem: Pain Managment: Goal: General experience of comfort will improve Outcome: Progressing   Problem: Safety: Goal: Ability to remain free from injury will improve Outcome: Progressing   Problem: Education: Goal: Ability to describe self-care measures that may prevent or decrease complications (Diabetes Survival Skills Education)  will improve Outcome: Progressing   Problem: Education: Goal: Individualized Educational Video(s) Outcome: Progressing   Problem: Coping: Goal: Ability to adjust to condition or change in health will improve Outcome: Progressing   Problem: Fluid Volume: Goal: Ability to maintain a balanced intake and output will improve Outcome: Progressing   Problem: Health Behavior/Discharge Planning: Goal: Ability to identify and utilize available resources and services will improve Outcome: Progressing   Problem: Health Behavior/Discharge Planning: Goal: Ability to manage health-related needs will improve Outcome: Progressing   Problem: Metabolic: Goal: Ability to maintain appropriate glucose levels will improve Outcome: Progressing   Problem: Nutritional: Goal: Maintenance of adequate nutrition will improve Outcome: Progressing   Problem: Nutritional: Goal: Progress toward achieving an optimal weight will improve Outcome: Progressing   Problem: Tissue Perfusion: Goal: Adequacy of tissue perfusion will improve Outcome: Progressing

## 2022-11-01 NOTE — Assessment & Plan Note (Signed)
ABI of the right was 0.77, TBI was 0.62. ABI of the left was 0.83, TBI was 0.64. -Patient is presently on a statin - arteriogram today

## 2022-11-01 NOTE — Assessment & Plan Note (Addendum)
Seems this has been a long-standing issue for her. Iron studies WNL. Patient is B12 deficient. Given 1 time IM B12.

## 2022-11-01 NOTE — Interval H&P Note (Signed)
History and Physical Interval Note:  11/01/2022 7:43 AM  Christine [name]  has presented today for surgery, with the diagnosis of pad with ulceration left leg.  The various methods of treatment have been discussed with the patient and family. After consideration of risks, benefits and other options for treatment, the patient has consented to  Procedure(s): ABDOMINAL AORTOGRAM W/LOWER EXTREMITY (N/A) as a surgical intervention.  The patient's history has been reviewed, patient examined, no change in status, stable for surgery.  I have reviewed the patient's chart and labs.  Questions were answered to the patient's satisfaction.     [name]

## 2022-11-01 NOTE — H&P (View-Only) (Signed)
Subjective:  Patient ID: Christine [name], female    DOB: 10/15/1970,  MRN: [id number]  Patient seen at bedside this morning. Doing well. Plan for vascular intervention today.   Past Medical History:  Diagnosis Date   Alcohol abuse    Angio-edema    Cellulitis 11/2016   RIGHT ARM   Chronic pancreatitis (HCC)    Diabetes mellitus    Esophageal candidiasis (HCC) 03/12/2019   Hypertension    Substance abuse [facility])      Past Surgical History:  Procedure Laterality Date   BREAST REDUCTION SURGERY         Latest Ref Rng & Units 11/01/2022    3:39 AM 10/29/2022    4:29 AM 10/28/2022    2:39 PM  CBC  WBC 4.0 - 10.5 K/uL 6.6  4.8  7.0   Hemoglobin 12.0 - 15.0 g/dL 7.9  8.3  8.4   Hematocrit 36.0 - 46.0 % 24.9  25.4  26.8   Platelets 150 - 400 K/uL 274  260  263        Latest Ref Rng & Units 11/01/2022    3:39 AM 10/31/2022   12:34 AM 10/30/2022   12:37 AM  BMP  Glucose 70 - 99 mg/dL 409  811  914   BUN 6 - 20 mg/dL 17  19  16    Creatinine 0.44 - 1.00 mg/dL 7.82  9.56  2.13   Sodium 135 - 145 mmol/L 132  130  132   Potassium 3.5 - 5.1 mmol/L 4.6  4.1  4.3   Chloride 98 - 111 mmol/L 104  103  105   CO2 22 - 32 mmol/L 19  18  20    Calcium 8.9 - 10.3 mg/dL 8.8  8.4  8.2      Objective:   Vitals:   11/01/22 0307 11/01/22 0710  BP: 138/77 (!) 162/85  Pulse: 77 75  Resp: 18 16  Temp: 97.7 F (36.5 C) 98.4 F (36.9 C)  SpO2: 100% 100%     General:AA&O x 3. Normal mood and affect    Vascular: DP and PT non palpable bilateral. Brisk capillary refill to all digits. Pedal hair present    Neruological. Epicritic sensation grossly intact.    Derm: Ulceration noted in the first interspace with maceration and drainage. No malodor and no purulence. Darkening noted around the medial first MPJ.    MSK: MMT 5/5 in dorsiflexion, plantar flexion, inversion and eversion. Normal joint ROM without pain or crepitus.    ABI  ABI Findings:   +---------+------------------+-----+----------+--------+  Right   Rt Pressure (mmHg)IndexWaveform  Comment   +---------+------------------+-----+----------+--------+  Brachial 160                    triphasic           +---------+------------------+-----+----------+--------+  PTA     110               0.68 monophasic          +---------+------------------+-----+----------+--------+  DP      127               0.79 monophasic          +---------+------------------+-----+----------+--------+  Great Toe78                0.48 Abnormal            +---------+------------------+-----+----------+--------+   +---------+------------------+-----+-------------------+-------+  Left    Lt Pressure (mmHg)IndexWaveform  Comment  +---------+------------------+-----+-------------------+-------+  Brachial 161                    triphasic                   +---------+------------------+-----+-------------------+-------+  PTA     117               0.73 dampened monophasic         +---------+------------------+-----+-------------------+-------+  DP      113               0.70 dampened monophasic         +---------+------------------+-----+-------------------+-------+  Great Toe48                0.30 Abnormal                    +---------+------------------+-----+-------------------+-------+   +-------+-----------+-----------+------------+------------+  ABI/TBIToday's ABIToday's TBIPrevious ABIPrevious TBI  +-------+-----------+-----------+------------+------------+  Right 0.79       0.48       0.83        0.62          +-------+-----------+-----------+------------+------------+  Left  0.73       0.30       0.84        0.64          +-------+-----------+-----------+------------+------------+       Right ABIs appear mildly decreased. Left ABIs appear mildly decreased  compared to prior study on 03-14-2021.     Summary:  Right: Resting right ankle-brachial index indicates moderate right lower  extremity arterial disease. The right toe-brachial index is abnormal.   Left: Resting left ankle-brachial index indicates moderate left lower  extremity arterial disease. The left toe-brachial index is abnormal.   MRI  IMPRESSION: 1. Soft tissue wound along the plantar aspect of the great toe with surrounding cellulitis. Large first MTP joint effusion with erosive changes along the plantar lateral aspect of the base of the first proximal phalanx and bone marrow edema throughout the first proximal phalanx and metatarsal head consistent with septic arthritis and osteomyelitis. 2. Bone marrow edema throughout the medial and lateral hallux sesamoids with an erosion of the plantar aspect of the lateral hallux sesamoid consistent with osteomyelitis. 3. A 1.8 x 1 x 2.2 cm complex peripherally enhancing fluid collection along the plantar aspect of the first MTP joint consistent with an abscess extending distally along the plantar lateral aspect of the great toe and between the first webspace with a 11 x 7 mm abscess. Along the plantar medial aspect of the first metatarsal neck there is a 10 x 7 mm abscess deep to the adductor tendon. Edema in the adductor hallucis brevis concerning for myositis. 4. Mild bone marrow edema in the second metatarsal head which may be reactive versus secondary to early osteomyelitis given the adjacent inflammation. 5. Flexor hallucis longus tendon is attenuated at the level of the first proximal phalanx concerning for a high-grade partial versus complete tear.  Assessment & Plan:  Patient was evaluated and treated and all questions answered.  DX: Left diabetic foot infection with osteomyelitis  Wound care: betadine, DSD changed daily.  Antibiotics: Continue IV antibiotics currently had ceftriaxone and flagyl.  Discussed with patient diagnosis and treatment options.   Imaging reviewed. Concern for osteomyelitis on X-ray. MRI with osteomyeltiis of hallux and first metatarsal.   Patient seen by vascular appreciated recommendations and will be going for angiography today.  Discussed with patient as long  as everything goes well with vascular can plan for toe amputation tomorrow morning. Patient in agreement with plan.  Discussed with patient that ultimately she will need amputation of the left first partial ray.   Patient in agreement with plan and all questions answered.  NPO after midnight tonight.   Louann Sjogren, DPM  Accessible via secure chat for questions or concerns.

## 2022-11-02 ENCOUNTER — Inpatient Hospital Stay (HOSPITAL_COMMUNITY): Payer: 59

## 2022-11-02 ENCOUNTER — Other Ambulatory Visit (HOSPITAL_COMMUNITY): Payer: Self-pay

## 2022-11-02 ENCOUNTER — Encounter (HOSPITAL_COMMUNITY): Payer: Self-pay | Admitting: Student

## 2022-11-02 ENCOUNTER — Inpatient Hospital Stay (HOSPITAL_COMMUNITY): Payer: 59 | Admitting: Anesthesiology

## 2022-11-02 ENCOUNTER — Encounter (HOSPITAL_COMMUNITY)
Admission: EM | Disposition: A | Discharge status: Home / Self Care | Payer: Self-pay | Source: Home / Self Care | Attending: Family Medicine

## 2022-11-02 DIAGNOSIS — L97529 Non-pressure chronic ulcer of other part of left foot with unspecified severity: Secondary | ICD-10-CM

## 2022-11-02 DIAGNOSIS — I10 Essential (primary) hypertension: Secondary | ICD-10-CM

## 2022-11-02 DIAGNOSIS — E13621 Other specified diabetes mellitus with foot ulcer: Secondary | ICD-10-CM

## 2022-11-02 DIAGNOSIS — M869 Osteomyelitis, unspecified: Secondary | ICD-10-CM | POA: Diagnosis not present

## 2022-11-02 DIAGNOSIS — M868X7 Other osteomyelitis, ankle and foot: Secondary | ICD-10-CM

## 2022-11-02 DIAGNOSIS — E1169 Type 2 diabetes mellitus with other specified complication: Secondary | ICD-10-CM

## 2022-11-02 DIAGNOSIS — E11621 Type 2 diabetes mellitus with foot ulcer: Secondary | ICD-10-CM

## 2022-11-02 DIAGNOSIS — F17210 Nicotine dependence, cigarettes, uncomplicated: Secondary | ICD-10-CM

## 2022-11-02 HISTORY — PX: AMPUTATION: SHX166

## 2022-11-02 LAB — SURGICAL PCR SCREEN
MRSA, PCR: NEGATIVE
Staphylococcus aureus: NEGATIVE

## 2022-11-02 LAB — CULTURE, BLOOD (ROUTINE X 2)
Culture: NO GROWTH
Special Requests: ADEQUATE
Special Requests: ADEQUATE

## 2022-11-02 LAB — GLUCOSE, CAPILLARY
Glucose-Capillary: 156 mg/dL — ABNORMAL HIGH (ref 70–99)
Glucose-Capillary: 111 mg/dL — ABNORMAL HIGH (ref 70–99)

## 2022-11-02 LAB — LIPID PANEL
Cholesterol: 76 mg/dL (ref 0–200)
HDL: 47 mg/dL (ref >40)
LDL Cholesterol: 18 mg/dL (ref 0–99)
Total CHOL/HDL Ratio: 1.6 RATIO
Triglycerides: 53 mg/dL (ref <150)
VLDL: 11 mg/dL (ref 0–40)

## 2022-11-02 LAB — AEROBIC/ANAEROBIC CULTURE W GRAM STAIN (SURGICAL/DEEP WOUND): Gram Stain: NONE SEEN

## 2022-11-02 SURGERY — AMPUTATION, FOOT, RAY
Anesthesia: Monitor Anesthesia Care | Laterality: Left

## 2022-11-02 MED ORDER — OXYCODONE HCL 5 MG PO TABS
5.0000 mg | ORAL_TABLET | Freq: Four times a day (QID) | ORAL | 0 refills | Status: AC | PRN
  Filled 2022-11-02: qty 24, 4d supply, fill #0

## 2022-11-02 MED ORDER — LACTATED RINGERS IV SOLN: INTRAVENOUS | Status: DC | PRN

## 2022-11-02 MED ORDER — BUPIVACAINE HCL (PF) 0.5 % IJ SOLN
INTRAMUSCULAR | Status: AC
  Filled 2022-11-02: qty 20

## 2022-11-02 MED ORDER — METFORMIN HCL ER 500 MG PO TB24
1000.0000 mg | ORAL_TABLET | Freq: Two times a day (BID) | ORAL | Status: DC
  Filled 2022-11-02: qty 2

## 2022-11-02 MED ORDER — OXYCODONE HCL 5 MG/5ML PO SOLN: 5.0000 mg | Freq: Once | ORAL | Status: DC | PRN

## 2022-11-02 MED ORDER — ACETAMINOPHEN 325 MG PO TABS: 650.0000 mg | ORAL_TABLET | ORAL | Status: AC | PRN

## 2022-11-02 MED ORDER — FENTANYL CITRATE (PF) 100 MCG/2ML IJ SOLN: 25.0000 ug | INTRAMUSCULAR | Status: DC | PRN

## 2022-11-02 MED ORDER — MIDAZOLAM HCL 2 MG/2ML IJ SOLN
INTRAMUSCULAR | Status: AC
  Filled 2022-11-02: qty 2

## 2022-11-02 MED ORDER — ACETAMINOPHEN 10 MG/ML IV SOLN: 1000.0000 mg | Freq: Once | INTRAVENOUS | Status: DC | PRN

## 2022-11-02 MED ORDER — PROMETHAZINE HCL 25 MG/ML IJ SOLN: 6.2500 mg | INTRAMUSCULAR | Status: DC | PRN

## 2022-11-02 MED ORDER — AMISULPRIDE (ANTIEMETIC) 5 MG/2ML IV SOLN: 10.0000 mg | Freq: Once | INTRAVENOUS | Status: DC | PRN

## 2022-11-02 MED ORDER — CLOPIDOGREL BISULFATE 75 MG PO TABS
75.0000 mg | ORAL_TABLET | Freq: Every day | ORAL | 0 refills | Status: DC
  Filled 2022-11-02: qty 30, 30d supply, fill #0

## 2022-11-02 MED ORDER — 0.9 % SODIUM CHLORIDE (POUR BTL) OPTIME
TOPICAL | Status: DC | PRN
  Administered 2022-11-02: 1000 mL

## 2022-11-02 MED ORDER — PROPOFOL 500 MG/50ML IV EMUL
INTRAVENOUS | Status: DC | PRN
  Administered 2022-11-02: 75 ug/kg/min via INTRAVENOUS

## 2022-11-02 MED ORDER — PROPOFOL 10 MG/ML IV BOLUS
INTRAVENOUS | Status: DC | PRN
  Administered 2022-11-02: 30 mg via INTRAVENOUS

## 2022-11-02 MED ORDER — AMOXICILLIN-POT CLAVULANATE 875-125 MG PO TABS
1.0000 | ORAL_TABLET | Freq: Two times a day (BID) | ORAL | 0 refills | Status: AC
  Filled 2022-11-02: qty 14, 7d supply, fill #0

## 2022-11-02 MED ORDER — LACTATED RINGERS IV SOLN: INTRAVENOUS | Status: DC

## 2022-11-02 MED ORDER — ASPIRIN 81 MG PO TBEC
81.0000 mg | DELAYED_RELEASE_TABLET | Freq: Every day | ORAL | 0 refills | Status: AC
  Filled 2022-11-02: qty 30, 30d supply, fill #0

## 2022-11-02 MED ORDER — LIDOCAINE HCL (PF) 2 % IJ SOLN
INTRAMUSCULAR | Status: DC | PRN
  Administered 2022-11-02: 10 mL via INTRAMUSCULAR

## 2022-11-02 MED ORDER — CHLORHEXIDINE GLUCONATE 0.12 % MT SOLN
OROMUCOSAL | Status: AC
  Filled 2022-11-02: qty 15

## 2022-11-02 MED ORDER — CHLORHEXIDINE GLUCONATE 0.12 % MT SOLN
15.0000 mL | Freq: Once | OROMUCOSAL | Status: AC
  Administered 2022-11-02: 15 mL via OROMUCOSAL

## 2022-11-02 MED ORDER — CYANOCOBALAMIN 1000 MCG PO TABS
1000.0000 ug | ORAL_TABLET | Freq: Every day | ORAL | 0 refills | Status: DC
  Filled 2022-11-02: qty 30, 30d supply, fill #0

## 2022-11-02 MED ORDER — LIDOCAINE HCL 2 % IJ SOLN
INTRAMUSCULAR | Status: AC
  Filled 2022-11-02: qty 20

## 2022-11-02 MED ORDER — MIDAZOLAM HCL 2 MG/2ML IJ SOLN
INTRAMUSCULAR | Status: DC | PRN
  Administered 2022-11-02: 2 mg via INTRAVENOUS

## 2022-11-02 MED ORDER — OXYCODONE HCL 5 MG PO TABS: 5.0000 mg | ORAL_TABLET | Freq: Once | ORAL | Status: DC | PRN

## 2022-11-02 MED ORDER — ORAL CARE MOUTH RINSE: 15.0000 mL | Freq: Once | OROMUCOSAL | Status: AC

## 2022-11-02 SURGICAL SUPPLY — 58 items
APL PRP STRL LF DISP 70% ISPRP (MISCELLANEOUS) ×1
BAG COUNTER SPONGE SURGICOUNT (BAG) ×1 IMPLANT
BAG SPNG CNTER NS LX DISP (BAG) ×1
BLADE LONG MED 31X9 (MISCELLANEOUS) IMPLANT
BLADE SAW SGTL 83.5X18.5 (BLADE) IMPLANT
BNDG CMPR 5X4 KNIT ELC UNQ LF (GAUZE/BANDAGES/DRESSINGS) ×1
BNDG CMPR 5X6 CHSV STRCH STRL (GAUZE/BANDAGES/DRESSINGS)
BNDG CMPR 9X4 STRL LF SNTH (GAUZE/BANDAGES/DRESSINGS)
BNDG COHESIVE 6X5 TAN ST LF (GAUZE/BANDAGES/DRESSINGS) IMPLANT
BNDG ELASTIC 4INX 5YD STR LF (GAUZE/BANDAGES/DRESSINGS) IMPLANT
BNDG ELASTIC 4X5.8 VLCR STR LF (GAUZE/BANDAGES/DRESSINGS) IMPLANT
BNDG ESMARK 4X9 LF (GAUZE/BANDAGES/DRESSINGS) IMPLANT
BNDG GAUZE DERMACEA FLUFF 4 (GAUZE/BANDAGES/DRESSINGS) IMPLANT
BNDG GZE DERMACEA 4 6PLY (GAUZE/BANDAGES/DRESSINGS) ×1
CHLORAPREP W/TINT 26 (MISCELLANEOUS) ×1 IMPLANT
COVER SURGICAL LIGHT HANDLE (MISCELLANEOUS) ×1 IMPLANT
CUFF TOURN SGL QUICK 18X4 (TOURNIQUET CUFF) IMPLANT
CUFF TOURN SGL QUICK 34 (TOURNIQUET CUFF)
CUFF TRNQT CYL 34X4.125X (TOURNIQUET CUFF) IMPLANT
DRAPE OEC MINIVIEW 54X84 (DRAPES) IMPLANT
DRSG ADAPTIC 3X8 NADH LF (GAUZE/BANDAGES/DRESSINGS) IMPLANT
ELECT CAUTERY BLADE 6.4 (BLADE) ×1 IMPLANT
ELECT REM PT RETURN 9FT ADLT (ELECTROSURGICAL) ×1
ELECTRODE REM PT RTRN 9FT ADLT (ELECTROSURGICAL) ×1 IMPLANT
GAUZE PACKING IODOFORM 1/2INX (GAUZE/BANDAGES/DRESSINGS) IMPLANT
GAUZE PAD ABD 8X10 STRL (GAUZE/BANDAGES/DRESSINGS) IMPLANT
GAUZE SPONGE 4X4 12PLY STRL (GAUZE/BANDAGES/DRESSINGS) IMPLANT
GAUZE XEROFORM 1X8 LF (GAUZE/BANDAGES/DRESSINGS) IMPLANT
GLOVE BIO SURGEON STRL SZ7.5 (GLOVE) ×1 IMPLANT
GLOVE BIOGEL PI IND STRL 7.5 (GLOVE) ×1 IMPLANT
GOWN STRL REUS W/ TWL LRG LVL3 (GOWN DISPOSABLE) ×2 IMPLANT
GOWN STRL REUS W/TWL LRG LVL3 (GOWN DISPOSABLE) ×2
HANDPIECE INTERPULSE COAX TIP (DISPOSABLE)
KIT BASIN OR (CUSTOM PROCEDURE TRAY) ×1 IMPLANT
KIT TURNOVER KIT B (KITS) ×1 IMPLANT
MANIFOLD NEPTUNE II (INSTRUMENTS) ×1 IMPLANT
NDL HYPO 25GX1X1/2 BEV (NEEDLE) ×1 IMPLANT
NEEDLE HYPO 25GX1X1/2 BEV (NEEDLE) ×1 IMPLANT
NS IRRIG 1000ML POUR BTL (IV SOLUTION) ×1 IMPLANT
PACK ORTHO EXTREMITY (CUSTOM PROCEDURE TRAY) ×1 IMPLANT
PAD ARMBOARD 7.5X6 YLW CONV (MISCELLANEOUS) ×2 IMPLANT
PAD CAST 4YDX4 CTTN HI CHSV (CAST SUPPLIES) IMPLANT
PADDING CAST COTTON 4X4 STRL (CAST SUPPLIES)
PADDING CAST COTTON 6X4 STRL (CAST SUPPLIES) IMPLANT
SET HNDPC FAN SPRY TIP SCT (DISPOSABLE) IMPLANT
SOL PREP POV-IOD 4OZ 10% (MISCELLANEOUS) ×1 IMPLANT
SOL SCRUB PVP POV-IOD 4OZ 7.5% (MISCELLANEOUS) ×1
SOLUTION SCRB POV-IOD 4OZ 7.5% (MISCELLANEOUS) ×1 IMPLANT
STAPLER VISISTAT 35W (STAPLE) IMPLANT
SUT ETHILON 3 0 FSL (SUTURE) IMPLANT
SUT PROLENE 3 0 PS 2 (SUTURE) IMPLANT
SWAB COLLECTION DEVICE MRSA (MISCELLANEOUS) ×1 IMPLANT
SWAB CULTURE ESWAB REG 1ML (MISCELLANEOUS) ×1 IMPLANT
SYR CONTROL 10ML LL (SYRINGE) ×1 IMPLANT
TOWEL GREEN STERILE (TOWEL DISPOSABLE) ×1 IMPLANT
TOWEL GREEN STERILE FF (TOWEL DISPOSABLE) ×1 IMPLANT
TUBE CONNECTING 12X1/4 (SUCTIONS) ×1 IMPLANT
YANKAUER SUCT BULB TIP NO VENT (SUCTIONS) ×1 IMPLANT

## 2022-11-02 NOTE — Discharge Summary (Signed)
Family Medicine Teaching [facility] Discharge Summary  Patient name: Christine [name] Medical record number: [id number] Date of birth: Sep 05, 1970 Age: 52 y.o. Gender: female Date of Admission: 10/28/2022  Date of Discharge: 11/02/22 Admitting Physician: Alicia Amel, MD  Primary Care Provider: Jerre Simon, MD Consultants: Podiatry  Indication for Hospitalization: Diabetic L foot wound with osteomyelitis  Brief Hospital Course:  GARI VEITH is a 52 y.o. female with history of diabetes, anemia, hypertension, alcohol use who was admitted to the family medicine teaching service at [facility] for diabetic left foot infection and concern for osteomyelitis.  Hospital course is as follows:  Osteomyelitis Patient presented to ED with left diabetic foot infection with concern for underlying osteomyelitis.  No signs of sepsis.  Started empiric antibiotics Rocephin and Flagyl until wound culture returned.  MRI of the left foot confirmed osteomyelitis podiatry consulted for amputation.  ABIs abnormal and vascular consulted for arteriogram. In the course of the admission patient remained stable and without evidence of systemic infection. Rocephin and Flagyl were discontinued and PO Augmentin started instead for good anaerobic coverage in anticipation of surgery. Left great toe amputation completed on 7/6. Podiatry recommends 1 week of Augmentin and follow up outpatient 1 week after discharge. Vascular surgery recommending continuing ASA and Plavix for peripheral vascular disease.   Diabetes mellitus with neuropathy Patient is on metformin outpatient. A1c is 6.2.  10/30/22: Fasting Glucose 220, so restarted home metformin. Blood sugar continued to rise to 354 so started novolog for good glycemic control with upcoming surgery. No further hyperglycemic episodes while hospitalized. She was discharged on home medications.   Anemia This is a longstanding problem for this patient.  Previously  thought to be iron deficiency anemia.  During this admission iron studies were WNL, but patient was found to have low B12 (105). Given one time B12 IM injection. Follow up CBC outpatient.   Peripheral vascular disease ABIs abnormal.  Vascular consulted and ordered arteriogram prior to surgery. Arteriogram showed multiple occluded and stenoses vessels but nothing significant to prevent planned toe amputation. Continue ASA and Plavix.    Items for follow-up Recommend referral to Nephrology for CKD Follow up with Podiatry in 1 week PCP to monitor B12 levels, anemia Patient taking Goodie powders on admission, counsel on discontinuing.  Continue education about alcohol cessation.   Discharge Diagnoses/Problem List:  Principal Problem:   Osteomyelitis (HCC) Active Problems:   Diabetes mellitus with neuropathy (HCC)   ANEMIA   Essential hypertension, benign   Alcohol use   Peripheral vascular disease (HCC)   Diabetic ulcer of left foot (HCC)   Disposition: home  Discharge Condition: stable  Discharge Exam:  General: well-appearing, no acute distress Cardio: RRR, no murmurs on exam. Pulm: CTA bilaterally. No increased work of breathing Extremities: no peripheral edema. L foot wrapped. Neuro: alert and oriented x3, speech normal in content. Psych:  Cognition and judgment appear intact. Alert, communicative  and cooperative.  Issues for Follow Up:  1. Follow up with Podiatry 1 week after discharge. 2. Recommend referral to Nephrology for CKD  Significant Procedures:  11/02/22: Left partial first ray amputation  Significant Labs and Imaging:  Recent Labs  Lab 11/01/22 0339 11/01/22 1507  WBC 6.6 7.6  HGB 7.9* 8.8*  HCT 24.9* 28.1*  PLT 274 273   Recent Labs  Lab 11/01/22 0339  NA 132*  K 4.6  CL 104  CO2 19*  GLUCOSE 252*  BUN 17  CREATININE 1.34*  CALCIUM  8.8*     Discharge Medications:  Allergies as of 11/02/2022   No Known Allergies      Medication List      STOP taking these medications    cetirizine 10 MG tablet Commonly known as: ZYRTEC   famotidine 40 MG tablet Commonly known as: Pepcid   fexofenadine 180 MG tablet Commonly known as: Allegra Allergy   GOODY HEADACHE PO   levocetirizine 5 MG tablet Commonly known as: XYZAL   montelukast 10 MG tablet Commonly known as: Singulair       TAKE these medications    Accu-Chek FastClix Lancets Misc Please use to check blood sugar up to 4 times daily. E11.9   Accu-Chek Guide test strip Generic drug: glucose blood Please use to check blood sugar up to 4 times daily. E11.9   acetaminophen 325 MG tablet Commonly known as: TYLENOL Take 2 tablets (650 mg total) by mouth every 4 (four) hours as needed for headache or mild pain.   amoxicillin-clavulanate 875-125 MG tablet Commonly known as: AUGMENTIN Take 1 tablet by mouth every 12 (twelve) hours for 7 days.   aspirin EC 81 MG tablet Take 1 tablet (81 mg total) by mouth daily. Swallow whole. Start taking on: November 03, 2022   blood glucose meter kit and supplies Kit Dispense based on patient and insurance preference. Use up to four times daily as directed.   calcipotriene 0.005 % ointment Commonly known as: DOVONOX Apply topically 2 (two) times daily. What changed: how much to take   calcipotriene 0.005 % cream Commonly known as: DOVONOX APPLY TOPICALLY DAILY. APPLY DAILY TO AFFECTED AREAS DAILY X 2 WEEKS THEN START CLOBETASOL What changed:  how much to take when to take this   chlorhexidine 4 % external liquid Commonly known as: Hibiclens Apply topically daily as needed.   Clobetasol Propionate E 0.05 % emollient cream Generic drug: Clobetasol Prop Emollient Base APPLY TO AFFECTED AREA TWICE A DAY What changed: See the new instructions.   clopidogrel 75 MG tablet Commonly known as: PLAVIX Take 1 tablet (75 mg total) by mouth daily. Start taking on: November 03, 2022   Cosentyx Sensoready (300 MG) 150 MG/ML  Soaj Generic drug: Secukinumab (300 MG Dose) Inject 2 mLs (300 mg total) into the skin as directed. On week 0, 1, 2, 3 and 4.   Cosentyx Sensoready (300 MG) 150 MG/ML Soaj Generic drug: Secukinumab (300 MG Dose) Inject 2 mLs (300 mg total) into the skin every 28 (twenty-eight) days. For maintenance.   cyanocobalamin 1000 MCG tablet Take 1 tablet (1,000 mcg total) by mouth daily. Start taking on: November 03, 2022   gabapentin 300 MG capsule Commonly known as: NEURONTIN TAKE 1 CAPSULE BY MOUTH THREE TIMES A DAY Strength: 300 mg What changed:  how much to take how to take this when to take this additional instructions   hydrocortisone 2.5 % lotion Apply topically 2 (two) times daily. Place on sites of rash 2 times daily. What changed: how much to take   hydrOXYzine 25 MG tablet Commonly known as: ATARAX Take 25 mg by mouth every 6 (six) hours as needed for anxiety or itching.   metFORMIN 500 MG 24 hr tablet Commonly known as: GLUCOPHAGE-XR Take 1 tablet (500 mg total) by mouth 2 (two) times daily with a meal. What changed: how much to take   mupirocin ointment 2 % Commonly known as: BACTROBAN Apply 1 Application topically 2 (two) times daily. Apply to affected area of left foot  oxyCODONE 5 MG immediate release tablet Commonly known as: Oxy IR/ROXICODONE Take 1-2 tablets (5-10 mg total) by mouth every 6 (six) hours as needed for up to 3 days for moderate pain.   rosuvastatin 10 MG tablet Commonly known as: CRESTOR Take 10 mg by mouth daily.   triamcinolone ointment 0.1 % Commonly known as: KENALOG APPLY TOPICALLY 2 TIMES DAILY AS NEEDED. DO NOT USE ON THE FACE, ARM PITS, OR GROIN.   triamcinolone ointment 0.5 % Commonly known as: KENALOG APPLY TO AFFECTED AREA TWICE A DAY        Discharge Instructions: Please refer to Patient Instructions section of EMR for full details.  Patient was counseled important signs and symptoms that should prompt return to medical care,  changes in medications, dietary instructions, activity restrictions, and follow up appointments.   Follow-Up Appointments:  Follow-up Information     Jerre Simon, MD. Go on 11/06/2022.   Specialty: Family Medicine Why: Appointment scheduled at 9:10am Contact information: [address] [place] [address] [phone]                 Glendale Chard, DO 11/02/2022, 11:30 AM PGY-2, [facility]

## 2022-11-02 NOTE — Assessment & Plan Note (Signed)
There is evidence of osteomyelitis on MRI affecting the left hallux. -toe amputation today -continue PO Augmentin (7/4 - ) -Continue gabapentin for neuropathic pain - appreciate Podiatry recs

## 2022-11-02 NOTE — Anesthesia Procedure Notes (Signed)
Procedure Name: MAC Date/Time: 11/02/2022 8:48 AM  Performed by: Gwenyth Allegra, CRNAPre-anesthesia Checklist: Patient identified, Emergency Drugs available, Suction available, Patient being monitored and Timeout performed Patient Re-evaluated:Patient Re-evaluated prior to induction Oxygen Delivery Method: Nasal cannula Preoxygenation: Pre-oxygenation with 100% oxygen Induction Type: IV induction

## 2022-11-02 NOTE — Op Note (Addendum)
OPERATIVE REPORT Patient name: Christine [name] MRN: [id number] DOB: [birth year]  DOS:  11/02/22  Preop Dx: Diabetic ulcer of left foot associated with other specified diabetes mellitus, unspecified part of foot, unspecified ulcer stage (HCC)  Osteomyelitis of left foot, unspecified type (HCC) Postop Dx: same  Procedure:  1. Left partial first ray amputation   Surgeon: Louann Sjogren, DPM  Anesthesia: 50-50 mixture of 2% lidocaine plain with 0.5% Marcaine plain totaling 10 infiltrated in the patient's left lower extremity  Hemostasis: No TQ necessary   EBL: 20 mL Materials: n/a Injectables: as above Pathology: Left hallux for pathology and residual right first metatarsal for culture   Condition: The patient tolerated the procedure and anesthesia well. No complications noted or reported   Justification for procedure: The patient is a 52 y.o. female who presents today for surgical treatment of osteomyelitis.  All conservative modalities of been unsuccessful in providing any sort of satisfactory alleviation of symptoms with the patient. The patient was told benefits as well as possible side effects of the surgery. The patient consented for surgical correction. The patient consent form was reviewed. All patient questions were answered. No guarantees were expressed or implied. The patient and the surgeon both signed the patient consent form with the witness present and placed in the patient's chart.   Procedure in Detail: The patient was brought to the operating room, placed in the operating table in the supine position at which time an aseptic scrub and drape were performed about the patient's respective lower extremity after anesthesia was induced as described above. Attention was then directed to the surgical area where procedure number one commenced.  Procedure #1:   Attention was directed to the left first digit were an incision was preformed encircling the base of the toe and  extending proximally medial to the first metatarsal. Incision was made down to bone and digit was amputated at the level of the metatarsophalangeal joint. After the toe was disarticulated it was passed off to the back table to be sent to pathology for further evaluation. The extensor and flexor tendons were identified and resected as far proximally as possible. The metatarsal head was dissected and resected with sagital saw to healthy hard bbone. Any necrotic tissue was removed to healthy bleeding tissue. The  residual metatarsal head was identified and noted to be hard. The area was irrigated copiously sterile saline and residual bone was sent to microbiology. Any bleeders noted were cauterized as necessary. The skin was re-approximated utilizing 3-0 nylon suture.    Dry sterile compressive dressings were then applied to all previously mentioned incision sites about the patient's lower extremity. The patient was then transferred from the operating room to the recovery room having tolerated the procedure and anesthesia well. All vital signs are stable. After a brief stay in the recovery room the patient was readmitted to the floor.    Disposition:  Patient will be ok for discharge from podiatry standpoint and will need one week of Augmentin upon discharge. She will be heel weight bearing in surgical shoe. She will keep dressing clean dry and intact until follow-up in our office one week from discharge.     Louann Sjogren, DPM [facility]  Dr. Louann Sjogren, DPM    [address]  [place], [place] [address]                Office [phone]  Fax [phone]

## 2022-11-02 NOTE — Transfer of Care (Signed)
Immediate Anesthesia Transfer of Care Note  Patient: Christine [name]  Procedure(s) Performed: AMPUTATION RAY (Left)  Patient Location: PACU  Anesthesia Type:MAC  Level of Consciousness: awake, alert , and oriented  Airway & Oxygen Therapy: Patient Spontanous Breathing  Post-op Assessment: Report given to RN  Post vital signs: Reviewed and stable  Last Vitals:  Vitals Value Taken Time  BP 123/73 11/02/22 0917  Temp    Pulse 86 11/02/22 0919  Resp 18 11/02/22 0919  SpO2 95 % 11/02/22 0919  Vitals shown include unvalidated device data.  Last Pain:  Vitals:   11/02/22 0822  TempSrc: Oral  PainSc: 0-No pain         Complications: No notable events documented.

## 2022-11-02 NOTE — Anesthesia Postprocedure Evaluation (Signed)
Anesthesia Post Note  Patient: Christine [name]  Procedure(s) Performed: AMPUTATION RAY (Left)     Patient location during evaluation: PACU Anesthesia Type: MAC Level of consciousness: awake Pain management: pain level controlled Vital Signs Assessment: post-procedure vital signs reviewed and stable Respiratory status: spontaneous breathing, nonlabored ventilation and respiratory function stable Cardiovascular status: blood pressure returned to baseline and stable Postop Assessment: no apparent nausea or vomiting Anesthetic complications: no   No notable events documented.  Last Vitals:  Vitals:   11/02/22 1045 11/02/22 1100  BP: (!) 153/82   Pulse:  87  Resp:    Temp:    SpO2:  100%    Last Pain:  Vitals:   11/02/22 1048  TempSrc:   PainSc: 6                  Michaelina Blandino P Othel Dicostanzo

## 2022-11-02 NOTE — Assessment & Plan Note (Signed)
Multiple CIWAs of 0, so discontinued CIWA scores. - Thiamine, folate, MVI

## 2022-11-02 NOTE — Discharge Instructions (Addendum)
Dear Christine [name],   Thank you for letting us participate in your care! In this section, you will find a brief hospital admission summary of why you were admitted to the hospital, what happened during your admission, your diagnosis/diagnoses, and recommended follow up.  Primary diagnosis: Osteomyelitis Treatment plan: You had surgery with Podiatry to amputate your toe.    POST-HOSPITAL & CARE INSTRUCTIONS We recommend following up with your PCP within 1 week from being discharged from the hospital. Please let PCP/Specialists know of any changes in medications that were made which you will be able to see in the medications section of this packet. Please also follow up with Podiatry 1 week after discharge. You have been prescribed an antibiotic called Augmentin to take for 1 week after surgery. Please take this as prescribed and call your doctor if you have any questions or concerns. We also recommend that you make an appointment with Nephrology, the kidney doctors, to discuss your chronic kidney disease.  DOCTOR'S APPOINTMENTS & FOLLOW UP Future Appointments  Date Time Provider Department Center  11/05/2022  9:10 AM Christine Simon, MD [facility]  11/05/2022  1:30 PM [facility] None  11/05/2022  2:00 PM Christine Gaul, MD [facility] None  12/12/2022  3:30 PM Christine Boop, MD [facility]     Thank you for choosing [facility]! Take care and be well!  Family Medicine Teaching Service Inpatient Team [place]  [facility]  [address] [place], [place] [address] [phone]

## 2022-11-02 NOTE — Anesthesia Preprocedure Evaluation (Addendum)
Anesthesia Evaluation  Patient identified by MRN, date of birth, ID band Patient awake    Reviewed: Allergy & Precautions, NPO status , Patient's Chart, lab work & pertinent test results  Airway Mallampati: II  TM Distance: >3 FB Neck ROM: Full    Dental no notable dental hx.    Pulmonary Current Smoker and Patient abstained from smoking.   Pulmonary exam normal        Cardiovascular hypertension, Normal cardiovascular exam     Neuro/Psych  PSYCHIATRIC DISORDERS  Depression    negative neurological ROS     GI/Hepatic negative GI ROS,,,(+)     substance abuse    Endo/Other  diabetes, Oral Hypoglycemic Agents    Renal/GU Renal disease     Musculoskeletal negative musculoskeletal ROS (+)    Abdominal   Peds  Hematology  (+) Blood dyscrasia, anemia   Anesthesia Other Findings Left foot osteomyelitis  Reproductive/Obstetrics                             Anesthesia Physical Anesthesia Plan  ASA: 3  Anesthesia Plan: MAC   Post-op Pain Management:    Induction: Intravenous  PONV Risk Score and Plan: 1 and Ondansetron, Dexamethasone, Propofol infusion, Midazolam and Treatment may vary due to age or medical condition  Airway Management Planned: Simple Face Mask  Additional Equipment:   Intra-op Plan:   Post-operative Plan:   Informed Consent: I have reviewed the patients History and Physical, chart, labs and discussed the procedure including the risks, benefits and alternatives for the proposed anesthesia with the patient or authorized representative who has indicated his/her understanding and acceptance.     Dental advisory given  Plan Discussed with: CRNA  Anesthesia Plan Comments:        Anesthesia Quick Evaluation

## 2022-11-02 NOTE — Interval H&P Note (Signed)
History and Physical Interval Note:  11/02/2022 8:19 AM  Christine [name]  has presented today for surgery, with the diagnosis of Left foot osteomyelitis.  The various methods of treatment have been discussed with the patient and family. After consideration of risks, benefits and other options for treatment, the patient has consented to  Procedure(s): AMPUTATION RAY (Left) as a surgical intervention.  The patient's history has been reviewed, patient examined, no change in status, stable for surgery.  I have reviewed the patient's chart and labs.  Questions were answered to the patient's satisfaction.     Louann Sjogren

## 2022-11-02 NOTE — Brief Op Note (Signed)
10/28/2022 - 11/02/2022  8:21 AM  PATIENT:  Christine [name]  52 y.o. female  PRE-OPERATIVE DIAGNOSIS:  Left foot osteomyelitis  POST-OPERATIVE DIAGNOSIS:  * No post-op diagnosis entered *  PROCEDURE:  Procedure(s): AMPUTATION RAY (Left)  SURGEON:  Surgeon(s) and Role:    * Louann Sjogren, DPM - Primary  PHYSICIAN ASSISTANT:   ASSISTANTS: none   ANESTHESIA:   local and MAC  EBL:  20 ml   BLOOD ADMINISTERED:none  DRAINS: none   LOCAL MEDICATIONS USED:  MARCAINE   , LIDOCAINE , and Amount: 10  ml  SPECIMEN:  Source of Specimen:  Left hallux for pathology and residual first metatarsal for culture   DISPOSITION OF SPECIMEN:   pathology and culture   COUNTS:  YES  TOURNIQUET:  * No tourniquets in log *  DICTATION: .Note written in EPIC  PLAN OF CARE: Admit to inpatient   PATIENT DISPOSITION:  PACU - hemodynamically stable.   Delay start of Pharmacological VTE agent (>24hrs) due to surgical blood loss or risk of bleeding: no

## 2022-11-02 NOTE — Progress Notes (Signed)
Patient is scheduled for a surgical procedure for left great toe. No presurgical order ordered. Sikora MB paged. No new orders at this time.

## 2022-11-02 NOTE — Progress Notes (Signed)
  Progress Note    11/02/2022 10:22 AM Day of Surgery  Subjective: Patient evaluated in PACU without any complaints  Vitals:   11/02/22 0930 11/02/22 0945  BP: 128/73 137/78  Pulse: 82 83  Resp: 13 11  Temp:    SpO2: 97% 98%    Physical Exam: Awake and alert Dressing on left foot Strong anterior tibial signal at the ankle  CBC    Component Value Date/Time   WBC 7.6 11/01/2022 1507   RBC 2.96 (L) 11/01/2022 1507   HGB 8.8 (L) 11/01/2022 1507   HGB 9.2 (L) 06/27/2022 1455   HCT 28.1 (L) 11/01/2022 1507   HCT 29.6 (L) 06/27/2022 1455   PLT 273 11/01/2022 1507   PLT 475 (H) 02/04/2020 1157   MCV 94.9 11/01/2022 1507   MCV 95 06/27/2022 1455   MCH 29.7 11/01/2022 1507   MCHC 31.3 11/01/2022 1507   RDW 16.3 (H) 11/01/2022 1507   RDW 14.2 06/27/2022 1455   LYMPHSABS 1.6 10/28/2022 1439   LYMPHSABS 1.1 06/27/2022 1455   MONOABS 0.6 10/28/2022 1439   EOSABS 0.1 10/28/2022 1439   EOSABS 0.2 06/27/2022 1455   BASOSABS 0.0 10/28/2022 1439   BASOSABS 0.0 06/27/2022 1455    BMET    Component Value Date/Time   NA 132 (L) 11/01/2022 0339   NA 140 06/27/2022 1455   K 4.6 11/01/2022 0339   CL 104 11/01/2022 0339   CO2 19 (L) 11/01/2022 0339   GLUCOSE 252 (H) 11/01/2022 0339   BUN 17 11/01/2022 0339   BUN 12 06/27/2022 1455   CREATININE 1.34 (H) 11/01/2022 0339   CREATININE 0.88 08/31/2014 1143   CALCIUM 8.8 (L) 11/01/2022 0339   GFRNONAA 48 (L) 11/01/2022 0339   GFRNONAA 81 10/28/2013 1150   GFRAA 102 02/09/2020 1015   GFRAA >89 10/28/2013 1150    INR No results found for: "INR"   Intake/Output Summary (Last 24 hours) at 11/02/2022 1022 Last data filed at 11/02/2022 0920 Gross per 24 hour  Intake 1789.88 ml  Output --  Net 1789.88 ml     Assessment/plan:  52 y.o. female is s/p left SFA stenting and now left partial first ray amputation.  She is optimized from an arterial standpoint and on aspirin, Plavix and statin.  Vascular surgery will be available as  needed over the weekend     [place] C. Randie Heinz, MD [facility] Office: [phone] Pager: [phone]  11/02/2022 10:22 AM

## 2022-11-02 NOTE — Assessment & Plan Note (Signed)
A1c 6.2 on 10/28/22. Glucose 252 this AM. - continue novolog for glycemic control in anticipation of surgery - Continue statin  - Continue home metformin 

## 2022-11-02 NOTE — Assessment & Plan Note (Signed)
ABI of the right was 0.77, TBI was 0.62. ABI of the left was 0.83, TBI was 0.64. Arteriogram yesterday sufficient for patient to proceed with amputation today. -Patient is presently on a statin

## 2022-11-02 NOTE — Progress Notes (Signed)
Daily Progress Note Intern Pager: [phone]  Patient name: Christine [name] Medical record number: [id number] Date of birth: 07/09/1970 Age: 52 y.o. Gender: female  Primary Care Provider: Jerre Simon, MD Consultants: Podiatry Code Status: Full  Assessment and Plan: 52 year old female with osteomyelitis. Patient with significant daily alcohol use disorder and history of chronic alcoholic pancreatitis.   Patient n.p.o. since midnight in anticipation of amputation of left great toe this morning.  Also held metformin for the same.  Hospital Problem List      Hospital     * (Principal) Osteomyelitis [facility])     There is evidence of osteomyelitis on MRI affecting the left hallux. -toe amputation today -continue PO Augmentin (7/4 - ) -Continue gabapentin for neuropathic pain - appreciate Podiatry recs        Diabetes mellitus with neuropathy (HCC)     A1c 6.2 on 10/28/22. Glucose 252 this AM. - continue novolog for glycemic control in anticipation of surgery - Continue statin  - Continue home metformin        ANEMIA     Seems this has been a long-standing issue for her. Iron studies WNL.  Patient is B12 deficient. Given 1 time IM B12.        Essential hypertension, benign     Not presently on any antihypertensives.  - Monitor BP, low threshold to add antihypertensive agent         Alcohol use     Multiple CIWAs of 0, so discontinued CIWA scores. - Thiamine, folate, MVI         Peripheral vascular disease (HCC)     ABI of the right was 0.77, TBI was 0.62. ABI of the left was 0.83, TBI was 0.64. Arteriogram yesterday sufficient  for patient to proceed with amputation today. -Patient is presently on a statin        Diabetic ulcer of left foot (HCC)    FEN/GI: Carb modified diet.  N.p.o. since midnight. PPx: Lovenox Dispo: Home pending clinical improvement postop.  Barriers include glycemic control.  Subjective:  Patient seen this morning in her room prior to  surgery. She is up walking around and in good spirits. She is without complaint.  Objective: Temp:  [97.7 F (36.5 C)-99.2 F (37.3 C)] 97.7 F (36.5 C) (07/06 0918) Pulse Rate:  [63-91] 82 (07/06 0930) Resp:  [0-19] 13 (07/06 0930) BP: (123-196)/(73-97) 128/73 (07/06 0930) SpO2:  [91 %-100 %] 97 % (07/06 0930) Physical Exam: General: well-appearing, no acute distress Cardio: RRR, no murmurs on exam. Pulm: CTA bilaterally. No increased work of breathing Extremities: no peripheral edema. L foot wrapped. Neuro: alert and oriented x3, speech normal in content. Psych:  Cognition and judgment appear intact. Alert, communicative  and cooperative.  Laboratory: Most recent CBC Lab Results  Component Value Date   WBC 7.6 11/01/2022   HGB 8.8 (L) 11/01/2022   HCT 28.1 (L) 11/01/2022   MCV 94.9 11/01/2022   PLT 273 11/01/2022   Most recent BMP    Latest Ref Rng & Units 11/01/2022    3:39 AM  BMP  Glucose 70 - 99 mg/dL 147   BUN 6 - 20 mg/dL 17   Creatinine 8.29 - 1.00 mg/dL 5.62   Sodium 130 - 865 mmol/L 132   Potassium 3.5 - 5.1 mmol/L 4.6   Chloride 98 - 111 mmol/L 104   CO2 22 - 32 mmol/L 19   Calcium 8.9 - 10.3 mg/dL 8.8  Cyndia Skeeters, DO 11/02/2022, 9:36 AM  PGY-1, [facility] FPTS Intern pager: [phone], text pages welcome Secure chat group [facility] Teaching Service

## 2022-11-03 ENCOUNTER — Encounter (HOSPITAL_COMMUNITY): Payer: Self-pay | Admitting: Podiatry

## 2022-11-04 ENCOUNTER — Telehealth: Payer: Self-pay

## 2022-11-04 ENCOUNTER — Encounter (HOSPITAL_COMMUNITY): Payer: Self-pay | Admitting: Surgery

## 2022-11-04 LAB — AEROBIC/ANAEROBIC CULTURE W GRAM STAIN (SURGICAL/DEEP WOUND)

## 2022-11-04 NOTE — Transitions of Care (Post Inpatient/ED Visit) (Signed)
11/04/2022  Name: Christine [name] MRN: [id number] DOB: 1970/07/13  Today's TOC FU Call Status: Today's TOC FU Call Status:: Successful TOC FU Call Competed TOC FU Call Complete Date: 11/04/22  Transition Care Management Follow-up Telephone Call Date of Discharge: 11/02/22 Discharge Facility: [facility]) Type of Discharge: Inpatient Admission Primary Inpatient Discharge Diagnosis:: DM with foot ulcer How have you been since you were released from the hospital?: Better Any questions or concerns?: No  Items Reviewed: Did you receive and understand the discharge instructions provided?: Yes Medications obtained,verified, and reconciled?: Yes (Medications Reviewed) Any new allergies since your discharge?: No Dietary orders reviewed?: Yes Do you have support at home?: Yes People in Home: spouse  Medications Reviewed Today: Medications Reviewed Today     Reviewed by Karena Addison, LPN (Licensed Practical Nurse) on 11/04/22 at [phone]  Med List Status: <None>   Medication Order Taking? Sig Documenting Provider Last Dose Status Informant  Accu-Chek FastClix Lancets MISC [id number] No Please use to check blood sugar up to 4 times daily. E11.9  Patient not taking: Reported on 08/05/2022   Moses Manners, MD Not Taking Active Self  acetaminophen (TYLENOL) 325 MG tablet [id number]  Take 2 tablets (650 mg total) by mouth every 4 (four) hours as needed for headache or mild pain. Glendale Chard, DO  Active   amoxicillin-clavulanate (AUGMENTIN) 875-125 MG tablet [id number]  Take 1 tablet by mouth every 12 (twelve) hours for 7 days. Glendale Chard, DO  Active   aspirin EC 81 MG tablet [id number]  Take 1 tablet (81 mg total) by mouth daily. Swallow whole. Glendale Chard, DO  Active   blood glucose meter kit and supplies KIT [id number] No Dispense based on patient and insurance preference. Use up to four times daily as directed.  Patient not taking: Reported on 08/05/2022   Vonna Drafts, MD Not Taking  Active Self           Med Note Carole Civil Aug 05, 2022 11:49 AM) Meter not working. PCP office is " ordering me a new one"  calcipotriene (DOVONOX) 0.005 % cream [id number] No APPLY TOPICALLY DAILY. APPLY DAILY TO AFFECTED AREAS DAILY X 2 WEEKS THEN START CLOBETASOL  Patient taking differently: Apply 1 Application topically 2 (two) times daily. Apply daily to affected areas daily x 2 weeks then start Clobetasol   Terri Piedra, DO 10/28/2022 Active Self  calcipotriene (DOVONOX) 0.005 % ointment [id number] No Apply topically 2 (two) times daily.  Patient taking differently: Apply 1 application  topically 2 (two) times daily.   Terri Piedra, DO 10/28/2022 Active Self  chlorhexidine (HIBICLENS) 4 % external liquid [id number] No Apply topically daily as needed.  Patient not taking: Reported on 10/28/2022   Terri Piedra, DO Not Taking Active Self  CLOBETASOL PROPIONATE E 0.05 % emollient cream [id number] No APPLY TO AFFECTED AREA TWICE A DAY  Patient taking differently: Apply 1 Application topically 2 (two) times daily.   Terri Piedra, DO 10/28/2022 Active Self  clopidogrel (PLAVIX) 75 MG tablet [id number]  Take 1 tablet (75 mg total) by mouth daily. Glendale Chard, DO  Active   cyanocobalamin 1000 MCG tablet [id number]  Take 1 tablet (1,000 mcg total) by mouth daily. Glendale Chard, DO  Active   gabapentin (NEURONTIN) 300 MG capsule [id number] No TAKE 1 CAPSULE BY MOUTH THREE TIMES A DAY Strength: 300 mg  Patient taking differently: Take 300 mg by mouth 3 (three) times daily.   Elliot Gurney,  John, MD 10/28/2022 Active Self  glucose blood (ACCU-CHEK GUIDE) test strip [id number] No Please use to check blood sugar up to 4 times daily. E11.9 Moses Manners, MD Taking Active Self  hydrocortisone 2.5 % lotion [id number] No Apply topically 2 (two) times daily. Place on sites of rash 2 times daily.  Patient taking differently: Apply 1 application  topically 2 (two) times daily. Place on sites of rash  2 times daily.   Gerhard Munch, MD 10/28/2022 Active Self  hydrOXYzine (ATARAX) 25 MG tablet [id number] No Take 25 mg by mouth every 6 (six) hours as needed for anxiety or itching. [provider] 10/27/2022 Active Self  metFORMIN (GLUCOPHAGE-XR) 500 MG 24 hr tablet [id number] No Take 1 tablet (500 mg total) by mouth 2 (two) times daily with a meal.  Patient taking differently: Take 1,000 mg by mouth 2 (two) times daily with a meal.   Jerre Simon, MD 10/26/2022 Expired 10/28/22 2359 Self           Med Note Jomarie Longs, REGEENA   Mon Oct 28, 2022  5:43 PM) Patient taking 1000 mg 2 times daily  mupirocin ointment (BACTROBAN) 2 % [id number] No Apply 1 Application topically 2 (two) times daily. Apply to affected area of left foot Terri Piedra, DO 10/28/2022 Active Self  oxyCODONE (OXY IR/ROXICODONE) 5 MG immediate release tablet [id number]  Take 1-2 tablets (5-10 mg total) by mouth every 6 (six) hours as needed for up to 3 days for moderate pain.   Active   rosuvastatin (CRESTOR) 10 MG tablet [id number] No Take 10 mg by mouth daily. [provider] 10/27/2022 Active Self  Secukinumab, 300 MG Dose, (COSENTYX SENSOREADY, 300 MG,) 150 MG/ML SOAJ [id number] No Inject 2 mLs (300 mg total) into the skin as directed. On week 0, 1, 2, 3 and 4.  Patient not taking: Reported on 10/28/2022   Terri Piedra, DO Not Taking Active Self  Secukinumab, 300 MG Dose, (COSENTYX SENSOREADY, 300 MG,) 150 MG/ML SOAJ [id number] No Inject 2 mLs (300 mg total) into the skin every 28 (twenty-eight) days. For maintenance.  Patient not taking: Reported on 10/28/2022   Terri Piedra, DO Not Taking Active Self  triamcinolone ointment (KENALOG) 0.1 % [id number] No APPLY TOPICALLY 2 TIMES DAILY AS NEEDED. DO NOT USE ON THE FACE, ARM PITS, OR GROIN.  Patient not taking: Reported on 08/13/2022   Alfonse Spruce, MD Not Taking Active Self  triamcinolone ointment (KENALOG) 0.5 % [id number] No APPLY TO AFFECTED AREA TWICE  A DAY  Patient not taking: Reported on 10/28/2022   Jerre Simon, MD Not Taking Active Self            Home Care and Equipment/Supplies: Were Home Health Services Ordered?: NA Any new equipment or medical supplies ordered?: NA  Functional Questionnaire: Do you need assistance with bathing/showering or dressing?: No Do you need assistance with meal preparation?: No Do you need assistance with eating?: No Do you have difficulty maintaining continence: No Do you need assistance with getting out of bed/getting out of a chair/moving?: No Do you have difficulty managing or taking your medications?: No  Follow up appointments reviewed: PCP Follow-up appointment confirmed?: Yes Date of PCP follow-up appointment?: 11/06/22 Follow-up Provider: [facility] Follow-up appointment confirmed?: Yes Date of Specialist follow-up appointment?: 11/06/22 Follow-Up Specialty Provider:: [facility] Do you need transportation to your follow-up appointment?: No Do you understand care options if your condition(s) worsen?: Yes-patient verbalized understanding    SIGNATURE Sandor Arboleda  Deloria Lair, LPN [facility] Nurse Health Advisor Direct Dial [phone]

## 2022-11-05 ENCOUNTER — Inpatient Hospital Stay: Payer: Self-pay | Admitting: Student

## 2022-11-05 ENCOUNTER — Inpatient Hospital Stay: Payer: Medicaid Other | Admitting: Internal Medicine

## 2022-11-05 ENCOUNTER — Inpatient Hospital Stay: Payer: Medicaid Other

## 2022-11-05 ENCOUNTER — Telehealth: Payer: Self-pay | Admitting: Internal Medicine

## 2022-11-05 LAB — AEROBIC/ANAEROBIC CULTURE W GRAM STAIN (SURGICAL/DEEP WOUND)

## 2022-11-05 NOTE — Telephone Encounter (Signed)
Patient was unavailable for original appointment time; patient is aware of rescheduled appointment times/dates

## 2022-11-06 LAB — SURGICAL PATHOLOGY

## 2022-11-11 ENCOUNTER — Encounter: Payer: Self-pay | Admitting: Podiatry

## 2022-11-11 ENCOUNTER — Ambulatory Visit (INDEPENDENT_AMBULATORY_CARE_PROVIDER_SITE_OTHER): Payer: Medicaid Other

## 2022-11-11 ENCOUNTER — Ambulatory Visit (INDEPENDENT_AMBULATORY_CARE_PROVIDER_SITE_OTHER): Payer: Medicaid Other | Admitting: Podiatry

## 2022-11-11 DIAGNOSIS — Z9889 Other specified postprocedural states: Secondary | ICD-10-CM | POA: Diagnosis not present

## 2022-11-11 NOTE — Progress Notes (Signed)
Subjective:  Patient ID: Christine [name], female    DOB: 08-11-1970,  MRN: [id number]  Chief Complaint  Patient presents with   Routine Post Op    hospital post op/ dos 7.6.2024/ left ray amputation    DOS: 11/02/22 Procedure: Left partial first ray amputation  52 y.o. female returns for POV#1. Relates doing well and managing pain.   Review of Systems: Negative except as noted in the HPI. Denies N/V/F/Ch.  Past Medical History:  Diagnosis Date   Alcohol abuse    Angio-edema    Cellulitis 11/2016   RIGHT ARM   Chronic pancreatitis (HCC)    Diabetes mellitus    Esophageal candidiasis (HCC) 03/12/2019   Hypertension    Substance abuse (HCC)     Current Outpatient Medications:    Accu-Chek FastClix Lancets MISC, Please use to check blood sugar up to 4 times daily. E11.9 (Patient not taking: Reported on 08/05/2022), Disp: 102 each, Rfl: 2   acetaminophen (TYLENOL) 325 MG tablet, Take 2 tablets (650 mg total) by mouth every 4 (four) hours as needed for headache or mild pain., Disp: , Rfl:    aspirin EC 81 MG tablet, Take 1 tablet (81 mg total) by mouth daily. Swallow whole., Disp: 30 tablet, Rfl: 0   blood glucose meter kit and supplies KIT, Dispense based on patient and insurance preference. Use up to four times daily as directed. (Patient not taking: Reported on 08/05/2022), Disp: 1 each, Rfl: 0   calcipotriene (DOVONOX) 0.005 % cream, APPLY TOPICALLY DAILY. APPLY DAILY TO AFFECTED AREAS DAILY X 2 WEEKS THEN START CLOBETASOL (Patient taking differently: Apply 1 Application topically 2 (two) times daily. Apply daily to affected areas daily x 2 weeks then start Clobetasol), Disp: 60 g, Rfl: 0   calcipotriene (DOVONOX) 0.005 % ointment, Apply topically 2 (two) times daily. (Patient taking differently: Apply 1 application  topically 2 (two) times daily.), Disp: 60 g, Rfl: 3   chlorhexidine (HIBICLENS) 4 % external liquid, Apply topically daily as needed. (Patient not taking: Reported on  10/28/2022), Disp: 118 mL, Rfl: 2   CLOBETASOL PROPIONATE E 0.05 % emollient cream, APPLY TO AFFECTED AREA TWICE A DAY (Patient taking differently: Apply 1 Application topically 2 (two) times daily.), Disp: 60 g, Rfl: 0   clopidogrel (PLAVIX) 75 MG tablet, Take 1 tablet (75 mg total) by mouth daily., Disp: 30 tablet, Rfl: 0   cyanocobalamin 1000 MCG tablet, Take 1 tablet (1,000 mcg total) by mouth daily., Disp: 30 tablet, Rfl: 0   gabapentin (NEURONTIN) 300 MG capsule, TAKE 1 CAPSULE BY MOUTH THREE TIMES A DAY Strength: 300 mg (Patient taking differently: Take 300 mg by mouth 3 (three) times daily.), Disp: 270 capsule, Rfl: 1   glucose blood (ACCU-CHEK GUIDE) test strip, Please use to check blood sugar up to 4 times daily. E11.9, Disp: 100 each, Rfl: 12   hydrocortisone 2.5 % lotion, Apply topically 2 (two) times daily. Place on sites of rash 2 times daily. (Patient taking differently: Apply 1 application  topically 2 (two) times daily. Place on sites of rash 2 times daily.), Disp: 118 mL, Rfl: 0   hydrOXYzine (ATARAX) 25 MG tablet, Take 25 mg by mouth every 6 (six) hours as needed for anxiety or itching., Disp: , Rfl:    metFORMIN (GLUCOPHAGE-XR) 500 MG 24 hr tablet, Take 1 tablet (500 mg total) by mouth 2 (two) times daily with a meal. (Patient taking differently: Take 1,000 mg by mouth 2 (two) times daily with a  meal.), Disp: 60 tablet, Rfl: 2   mupirocin ointment (BACTROBAN) 2 %, Apply 1 Application topically 2 (two) times daily. Apply to affected area of left foot, Disp: 30 g, Rfl: 1   rosuvastatin (CRESTOR) 10 MG tablet, Take 10 mg by mouth daily., Disp: , Rfl:    Secukinumab, 300 MG Dose, (COSENTYX SENSOREADY, 300 MG,) 150 MG/ML SOAJ, Inject 2 mLs (300 mg total) into the skin as directed. On week 0, 1, 2, 3 and 4. (Patient not taking: Reported on 10/28/2022), Disp: 10 mL, Rfl: 0   Secukinumab, 300 MG Dose, (COSENTYX SENSOREADY, 300 MG,) 150 MG/ML SOAJ, Inject 2 mLs (300 mg total) into the skin every  28 (twenty-eight) days. For maintenance. (Patient not taking: Reported on 10/28/2022), Disp: 2 mL, Rfl: 3   triamcinolone ointment (KENALOG) 0.1 %, APPLY TOPICALLY 2 TIMES DAILY AS NEEDED. DO NOT USE ON THE FACE, ARM PITS, OR GROIN. (Patient not taking: Reported on 08/13/2022), Disp: 454 g, Rfl: 0   triamcinolone ointment (KENALOG) 0.5 %, APPLY TO AFFECTED AREA TWICE A DAY (Patient not taking: Reported on 10/28/2022), Disp: 30 g, Rfl: 0  Social History   Tobacco Use  Smoking Status Every Day   Current packs/day: 1.00   Average packs/day: 1 pack/day for 22.0 years (22.0 ttl pk-yrs)   Types: Cigarettes   Passive exposure: Current  Smokeless Tobacco Never  Tobacco Comments   working on quitting    No Known Allergies Objective:  There were no vitals filed for this visit. There is no height or weight on file to calculate BMI. Constitutional Well developed. Well nourished.  Vascular Foot warm and well perfused. Capillary refill normal to all digits.   Neurologic Normal speech. Oriented to person, place, and time. Epicritic sensation to light touch grossly present bilaterally.  Dermatologic Skin healing well without signs of infection. Skin edges well coapted without signs of infection.  Orthopedic: Tenderness to palpation noted about the surgical site.   Radiographs: Interval resection of first ray Assessment:   1. Post-operative state    Plan:  Patient was evaluated and treated and all questions answered.  S/p foot surgery left -Progressing as expected post-operatively. -WB Status: WBAT in surgical shoe -Sutures: intact. -Medications: n/a -Foot redressed.  Follow-up in 2 weeks for suture removal.    No follow-ups on file.

## 2022-11-12 ENCOUNTER — Ambulatory Visit: Payer: Medicaid Other | Admitting: Student

## 2022-11-12 ENCOUNTER — Encounter: Payer: Self-pay | Admitting: Student

## 2022-11-12 ENCOUNTER — Telehealth: Payer: Self-pay

## 2022-11-12 VITALS — BP 140/60 | HR 82 | Ht 69.0 in | Wt 140.2 lb

## 2022-11-12 DIAGNOSIS — E1122 Type 2 diabetes mellitus with diabetic chronic kidney disease: Secondary | ICD-10-CM

## 2022-11-12 DIAGNOSIS — F32A Depression, unspecified: Secondary | ICD-10-CM

## 2022-11-12 DIAGNOSIS — N181 Chronic kidney disease, stage 1: Secondary | ICD-10-CM

## 2022-11-12 DIAGNOSIS — D649 Anemia, unspecified: Secondary | ICD-10-CM

## 2022-11-12 DIAGNOSIS — I10 Essential (primary) hypertension: Secondary | ICD-10-CM | POA: Diagnosis not present

## 2022-11-12 MED ORDER — LOSARTAN POTASSIUM 25 MG PO TABS
25.0000 mg | ORAL_TABLET | Freq: Every day | ORAL | 3 refills | Status: DC
Start: 2022-11-12 — End: 2022-11-26

## 2022-11-12 MED ORDER — FLUOXETINE HCL 20 MG PO TABS
ORAL_TABLET | ORAL | 0 refills | Status: DC
Start: 2022-11-12 — End: 2022-12-02

## 2022-11-12 NOTE — Assessment & Plan Note (Addendum)
Baseline creatinine in the 1.30s, which most recent GFR of 48.  Suspect patient most likely having chronic kidney disease secondary to long-term type 2 diabetes. -Started patient on low-dose losartan 25 mg daily -Placed referral to nephrology.

## 2022-11-12 NOTE — Assessment & Plan Note (Signed)
Patient with a PHQ-9 score.  Suspect patient have underlying depression given her reports of anhedonia, poor sleep, and new identified stressor of recent left toe amputation.  Is not on any current medications and no signs of SI/HI.  Will start patient on an SSRI and discussed with patient she would benefit from counseling which which she was agreeable to. -Rx Prozac 20 mg daily for 7 days, increase to 40 mg daily if tolerated. -Provided patient with list of therapist -Follow-up in 3-4 weeks.

## 2022-11-12 NOTE — Assessment & Plan Note (Signed)
Patient's blood pressure today is elevated x 2 at 145/83.  Not on any home blood pressure medications.  Denies any vision changes, chest pains or dyspnea.  Given her history of diabetes she would benefit from an ARB.  Will start patient on losartan 25 mg. -Rx losartan 25 mg daily -Encourage patient to keep a blood pressure diary for follow-up -Follow-up in 1 week to check BMP

## 2022-11-12 NOTE — Telephone Encounter (Signed)
Called patient and LVM informing her to make appointment for a LAB visit per Dr. Elliot Gurney.  Glennie Hawk, CMA

## 2022-11-12 NOTE — Patient Instructions (Addendum)
It was wonderful to see you today. Thank you for allowing me to be a part of your care. Below is a short summary of what we discussed at your visit today:  Glad to hear that your toe is doing fine and pain is well-managed.  Your blood pressure today is elevated I have started you on losartan 25 mg.  Please make sure you check your blood pressures daily for the next 2 weeks and follow-up with me in 2 weeks.  I have also sent a referral to nephrology to manage your kidney.  Today we are obtaining labs to check your blood counts.  I have also started you on Prozac which is a medication to help with depression.  You should take 20 mg daily for 7 days and if you are tolerating well then increase to 40 mg daily.  I have also included a list of therapist who you should call and schedule an appointment weight.  Follow-up in 10 days.  Please bring all of your medications to every appointment!  If you have any questions or concerns, please do not hesitate to contact us via phone or MyChart message.   Jerre Simon, MD [facility]    Therapy and Counseling Resources Most providers on this list will take Medicaid. Patients with commercial insurance or Medicare should contact their insurance company to get a list of in network providers.  [facility] (takes children) Location 1: [address] [place], [place] [address] Location 2: [address] [place], [place] [address] [phone]   [facility] (spanish speaking therapist available)(habla espanol)(take medicare and medicaid)  [address], [place], [place] [address], [email or web address] [email or web address] [phone]  [facility]:Psychiatry and Counseling [address] [place], [place] [address] [phone]  [facility]   [address], Suite [place], [place] [address]      [phone]  [facility] (spanish available) [address]  [place], [place]  [address] [phone]  [facility] (take [facility] and medicare) [address]  [place], [place] [address]       [phone]     [facility] (virtual only) [phone]  Jovita Kussmaul [facility] [address], [place], [place] [phone]  [facility]:  [address] [place] [phone]  [facility]:  [address] [place] [phone]  [facility] (under & uninsured) [address]   [place] [phone]    [email or web address]    [facility] [address]  [place]    [phone]  [facility] -[address]     Phone:  [phone]     [facility]  [address]  [phone]   [facility] [address]      [place], [place] [phone]  [facility]: [address] [place], [place], [place]  [phone]   [facility] (Spanish therapist) [email or web address]  [address] [place]  [address]   [place] [address]    [phone]    [facility]   [address],  [place], [place]  [phone]   [facility]  [address] [place]  [phone]  [facility]  [address] [place], [place]        [phone]  Open Access/Walk In Clinic under & uninsured  [place]  [facility]  [address] [place], [place] [phone] Crisis [phone]  [facility],  (Spanish)   [address], [place]: [phone]) 8:30 - 12; 1 - 2:30  [facility] [employer],  [address] [place], [place]    [phone]):8:30 - 12; 2 - 3PM  [facility] [employer],  [address],  [place]; [phone]):   Mon - Fri 8 AM - 5 PM  [facility] [address] [place]  MWF 12:30 to 3:00 or call  to schedule an appointment  [phone]  Specific Provider options Psychology Today  [email or web address] click on find a therapist  enter your zip code left side and select or tailor a therapist for your specific need.   [facility] Provider Directory [email or web address]  (Medicaid)   Follow all drop down to find a provider  Social Support program [facility] [phone] or PhotoSolver.pl [address], [place], [place] Recovery support and educational   24- Hour Availability:   [facility]  [address] [place], [place] [phone] Crisis [phone]  [facility] [phone]  [place]  [phone]   [facility]  [phone] (after hours)  Therapeutic Alternative/Mobile Crisis   [phone]  [place] National Suicide Hotline  [phone] Len Childs)  Call 911 or go to emergency room  [facility]  [phone]);  [place] and [employer]  [phone]); [place], [place], [place], [place], [place], [place], [place]

## 2022-11-12 NOTE — Telephone Encounter (Signed)
-----   Message from Jerre Simon sent at 11/12/2022 12:43 PM EDT ----- Please can you call this patient to return in a week for lab since we started her on that new blood pressure medication today.  JN

## 2022-11-12 NOTE — Assessment & Plan Note (Signed)
Found to be anemic during her hospitalization with hemoglobin of 8.8.  Suspect her anemia could be a combination of chronic alcohol use vs anemia of chronic disease given history of chronic kidney disease.  She is currently asymptomatic.  Will obtain CBC at this visit.

## 2022-11-12 NOTE — Progress Notes (Signed)
    SUBJECTIVE:   CHIEF COMPLAINT / HPI:   52 year old female presenting today for hospital follow-up.  She was admitted due to concerns of left great toe osteomyelitis requiring amputation.  She has had follow-up with podiatry recently who report post up care has been good with plans for follow-up in 2 weeks for stitch removal.  Patient reports she is overall doing well, no concerns at this time.  Alcohol use: Reports she has significantly cut down on her alcohol use since her discharge from the hospital.  She is currently only taking about 2 cans of Miller light daily.  He is also planning on cutting down even more.  Has not had any signs of withdrawal.  Depression Patient reports she has been feeling down lately especially since her left toe amputation.  This has got worried and also her drinking being identified as a problem.  Patient reports she is mostly been keeping to herself and has not been as active as usual.  She has not had extended days of sleep last night, she occasionally gets 6 to 7 hours of sleep sometimes.  Denies any HI/SI.  No identifiable coping mechanism, usually used to be able to see her grandkids but haven't seen them lately.   PERTINENT  PMH / PSH: Reviewed  OBJECTIVE:   BP (!) 140/60   Pulse 82   Ht 5\' 9"  (1.753 m)   Wt 140 lb 4 oz (63.6 kg)   SpO2 96%   BMI 20.71 kg/m    Flowsheet Row Office Visit from 11/12/2022 in [facility]  PHQ-9 Total Score 15        Physical Exam General: Alert, well appearing, NAD, Oriented x4 Cardiovascular: RRR, No Murmurs, Normal S2/S2 Respiratory: CTAB, No wheezing or Rales Abdomen: No distension or tenderness Extremities: No edema on extremities   Skin: Warm and dry  ASSESSMENT/PLAN:   Essential hypertension, benign Patient's blood pressure today is elevated x 2 at 145/83.  Not on any home blood pressure medications.  Denies any vision changes, chest pains or dyspnea.  Given her history of  diabetes she would benefit from an ARB.  Will start patient on losartan 25 mg. 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  Suspect patient most likely having chronic kidney disease secondary to long-term type 2 diabetes. -Started patient on low-dose losartan 25 mg daily -Placed referral to nephrology.  ANEMIA Found to be anemic during her hospitalization with hemoglobin of 8.8.  Suspect her anemia could be a combination of chronic alcohol use vs anemia of chronic disease given history of chronic kidney disease.  She is currently asymptomatic.  Will obtain CBC at this visit.  Depression Patient with a PHQ-9 score.  Suspect patient have underlying depression given her reports of anhedonia, poor sleep, and new identified stressor of recent left toe amputation.  Is not on any current medications and no signs of SI/HI.  Will start patient on an SSRI and discussed with patient she would benefit from counseling which which she was agreeable to. -Rx Prozac 20 mg daily for 7 days, increase to 40 mg daily if tolerated. -Provided patient with list of therapist -Follow-up in 3-4 weeks.     Jerre Simon, MD [facility]

## 2022-11-13 ENCOUNTER — Telehealth: Payer: Self-pay | Admitting: Student

## 2022-11-13 DIAGNOSIS — D649 Anemia, unspecified: Secondary | ICD-10-CM

## 2022-11-13 LAB — CBC
Hematocrit: 24.8 % — ABNORMAL LOW (ref 34.0–46.6)
Hemoglobin: 7.6 g/dL — ABNORMAL LOW (ref 11.1–15.9)
MCH: 28.4 pg (ref 26.6–33.0)
MCHC: 30.6 g/dL — ABNORMAL LOW (ref 31.5–35.7)
MCV: 93 fL (ref 79–97)
Platelets: 455 10*3/uL — ABNORMAL HIGH (ref 150–450)
RBC: 2.68 x10E6/uL — CL (ref 3.77–5.28)
RDW: 15.5 % — ABNORMAL HIGH (ref 11.7–15.4)
WBC: 6.8 10*3/uL (ref 3.4–10.8)

## 2022-11-13 NOTE — Telephone Encounter (Signed)
Called and verified patient to discuss recent lab results.  From patient hemoglobin slightly decreased to 7.6 from 8.8 at the hospital.  She is currently asymptomatic and denied any signs of bleeding. Given the drop in hemoglobin to return on Friday for lab to recheck CBC.  Patient verbalized understanding and agreeable to plan.

## 2022-11-14 ENCOUNTER — Other Ambulatory Visit: Payer: Self-pay | Admitting: Family Medicine

## 2022-11-14 ENCOUNTER — Other Ambulatory Visit: Payer: Self-pay | Admitting: Vascular Surgery

## 2022-11-14 ENCOUNTER — Other Ambulatory Visit: Payer: Self-pay | Admitting: Dermatology

## 2022-11-14 ENCOUNTER — Other Ambulatory Visit: Payer: Self-pay | Admitting: Student

## 2022-11-14 DIAGNOSIS — L309 Dermatitis, unspecified: Secondary | ICD-10-CM

## 2022-11-14 DIAGNOSIS — L989 Disorder of the skin and subcutaneous tissue, unspecified: Secondary | ICD-10-CM

## 2022-11-15 ENCOUNTER — Other Ambulatory Visit: Payer: Medicaid Other

## 2022-11-15 DIAGNOSIS — D649 Anemia, unspecified: Secondary | ICD-10-CM

## 2022-11-16 LAB — CBC
Hematocrit: 25.5 % — ABNORMAL LOW (ref 34.0–46.6)
Hemoglobin: 8 g/dL — ABNORMAL LOW (ref 11.1–15.9)
MCH: 28.4 pg (ref 26.6–33.0)
MCHC: 31.4 g/dL — ABNORMAL LOW (ref 31.5–35.7)
MCV: 90 fL (ref 79–97)
Platelets: 411 10*3/uL (ref 150–450)
RBC: 2.82 x10E6/uL — ABNORMAL LOW (ref 3.77–5.28)
RDW: 15.7 % — ABNORMAL HIGH (ref 11.7–15.4)
WBC: 6.5 10*3/uL (ref 3.4–10.8)

## 2022-11-21 ENCOUNTER — Telehealth: Payer: Self-pay

## 2022-11-21 NOTE — Telephone Encounter (Signed)
Received notification on Cover my Meds the Calcipotriene needs PA. Pa was done. Received faxes from [employer] stating member not found. The insurance used was from the only card on file from 2022. I called [facility] to check status on med. Rep said her Medicaid covered the cost.

## 2022-11-22 ENCOUNTER — Other Ambulatory Visit: Payer: Self-pay | Admitting: Internal Medicine

## 2022-11-22 ENCOUNTER — Other Ambulatory Visit: Payer: Self-pay

## 2022-11-22 DIAGNOSIS — D539 Nutritional anemia, unspecified: Secondary | ICD-10-CM

## 2022-11-25 ENCOUNTER — Encounter: Payer: Self-pay | Admitting: Podiatry

## 2022-11-25 ENCOUNTER — Ambulatory Visit: Payer: Medicaid Other | Admitting: Podiatry

## 2022-11-25 ENCOUNTER — Inpatient Hospital Stay: Payer: Medicaid Other

## 2022-11-25 ENCOUNTER — Inpatient Hospital Stay: Payer: Medicaid Other | Admitting: Internal Medicine

## 2022-11-25 DIAGNOSIS — Z9889 Other specified postprocedural states: Secondary | ICD-10-CM

## 2022-11-25 NOTE — Progress Notes (Signed)
Subjective:  Patient ID: Christine [name], female    DOB: 07/08/1970,  MRN: [id number]  No chief complaint on file.   DOS: 11/02/22 Procedure: Left partial first ray amputation  52 y.o. female returns for POV#2. Relates doing well and managing pain.   Review of Systems: Negative except as noted in the HPI. Denies N/V/F/Ch.  Past Medical History:  Diagnosis Date   Alcohol abuse    Anemia    Angio-edema    Cellulitis 11/2016   RIGHT ARM   Chronic pancreatitis (HCC)    Diabetes mellitus    Esophageal candidiasis (HCC) 03/12/2019   Hypertension    Substance abuse (HCC)     Current Outpatient Medications:    Accu-Chek FastClix Lancets MISC, Please use to check blood sugar up to 4 times daily. E11.9 (Patient not taking: Reported on 08/05/2022), Disp: 102 each, Rfl: 2   acetaminophen (TYLENOL) 325 MG tablet, Take 2 tablets (650 mg total) by mouth every 4 (four) hours as needed for headache or mild pain., Disp: , Rfl:    aspirin EC 81 MG tablet, Take 1 tablet (81 mg total) by mouth daily. Swallow whole., Disp: 30 tablet, Rfl: 0   blood glucose meter kit and supplies KIT, Dispense based on patient and insurance preference. Use up to four times daily as directed. (Patient not taking: Reported on 08/05/2022), Disp: 1 each, Rfl: 0   calcipotriene (DOVONOX) 0.005 % cream, APPLY TOPICALLY DAILY. APPLY DAILY TO AFFECTED AREAS DAILY X 2 WEEKS THEN START CLOBETASOL (Patient taking differently: Apply 1 Application topically 2 (two) times daily. Apply daily to affected areas daily x 2 weeks then start Clobetasol), Disp: 60 g, Rfl: 0   calcipotriene (DOVONOX) 0.005 % ointment, Apply topically 2 (two) times daily. (Patient taking differently: Apply 1 application  topically 2 (two) times daily.), Disp: 60 g, Rfl: 3   chlorhexidine (HIBICLENS) 4 % external liquid, Apply topically daily as needed. (Patient not taking: Reported on 10/28/2022), Disp: 118 mL, Rfl: 2   CLOBETASOL PROPIONATE E 0.05 % emollient cream,  APPLY TO AFFECTED AREA TWICE A DAY, Disp: 60 g, Rfl: 0   clopidogrel (PLAVIX) 75 MG tablet, Take 1 tablet (75 mg total) by mouth daily., Disp: 30 tablet, Rfl: 0   cyanocobalamin 1000 MCG tablet, Take 1 tablet (1,000 mcg total) by mouth daily., Disp: 30 tablet, Rfl: 0   FLUoxetine (PROZAC) 20 MG tablet, Take 1 tablet (20 mg total) by mouth daily for 7 days, THEN 2 tablets (40 mg total) daily for 21 days., Disp: 49 tablet, Rfl: 0   gabapentin (NEURONTIN) 300 MG capsule, TAKE 1 CAPSULE BY MOUTH THREE TIMES A DAY Strength: 300 mg (Patient taking differently: Take 300 mg by mouth 3 (three) times daily.), Disp: 270 capsule, Rfl: 1   glucose blood (ACCU-CHEK GUIDE) test strip, Please use to check blood sugar up to 4 times daily. E11.9, Disp: 100 each, Rfl: 12   hydrocortisone 2.5 % lotion, Apply topically 2 (two) times daily. Place on sites of rash 2 times daily. (Patient taking differently: Apply 1 application  topically 2 (two) times daily. Place on sites of rash 2 times daily.), Disp: 118 mL, Rfl: 0   hydrOXYzine (ATARAX) 25 MG tablet, Take 25 mg by mouth every 6 (six) hours as needed for anxiety or itching., Disp: , Rfl:    losartan (COZAAR) 25 MG tablet, Take 1 tablet (25 mg total) by mouth at bedtime., Disp: 90 tablet, Rfl: 3   metFORMIN (GLUCOPHAGE-XR) 500 MG 24  hr tablet, Take 1 tablet (500 mg total) by mouth 2 (two) times daily with a meal. (Patient taking differently: Take 1,000 mg by mouth 2 (two) times daily with a meal.), Disp: 60 tablet, Rfl: 2   mupirocin ointment (BACTROBAN) 2 %, Apply 1 Application topically 2 (two) times daily. Apply to affected area of left foot, Disp: 30 g, Rfl: 1   rosuvastatin (CRESTOR) 10 MG tablet, TAKE 1 TABLET BY MOUTH EVERY DAY, Disp: 30 tablet, Rfl: 0   Secukinumab, 300 MG Dose, (COSENTYX SENSOREADY, 300 MG,) 150 MG/ML SOAJ, Inject 2 mLs (300 mg total) into the skin as directed. On week 0, 1, 2, 3 and 4. (Patient not taking: Reported on 10/28/2022), Disp: 10 mL, Rfl:  0   Secukinumab, 300 MG Dose, (COSENTYX SENSOREADY, 300 MG,) 150 MG/ML SOAJ, Inject 2 mLs (300 mg total) into the skin every 28 (twenty-eight) days. For maintenance. (Patient not taking: Reported on 10/28/2022), Disp: 2 mL, Rfl: 3   triamcinolone ointment (KENALOG) 0.1 %, APPLY TOPICALLY 2 TIMES DAILY AS NEEDED. DO NOT USE ON THE FACE, ARM PITS, OR GROIN. (Patient not taking: Reported on 08/13/2022), Disp: 454 g, Rfl: 0   triamcinolone ointment (KENALOG) 0.5 %, APPLY TO AFFECTED AREA TWICE A DAY, Disp: 30 g, Rfl: 0  Social History   Tobacco Use  Smoking Status Every Day   Current packs/day: 1.00   Average packs/day: 1 pack/day for 22.0 years (22.0 ttl pk-yrs)   Types: Cigarettes   Passive exposure: Current  Smokeless Tobacco Never  Tobacco Comments   working on quitting    No Known Allergies Objective:  There were no vitals filed for this visit. There is no height or weight on file to calculate BMI. Constitutional Well developed. Well nourished.  Vascular Foot warm and well perfused. Capillary refill normal to all digits.   Neurologic Normal speech. Oriented to person, place, and time. Epicritic sensation to light touch grossly present bilaterally.  Dermatologic Skin healing well without signs of infection. Skin edges well coapted without signs of infection. Venous insufficiency changes noted with edema.   Orthopedic: Tenderness to palpation noted about the surgical site.   Radiographs: Interval resection of first ray Assessment:   1. Post-operative state    Plan:  Patient was evaluated and treated and all questions answered.  S/p foot surgery left -Progressing as expected post-operatively. -WB Status: WBAT in regular shoe -Sutures: removed without incident.  -Medications: n/a -Advised to keep legs elevated and use compression.  -Foot redressed.  Follow-up in 3 weeks for recheck     Return in about 3 weeks (around 12/16/2022) for post op.

## 2022-11-26 ENCOUNTER — Other Ambulatory Visit (HOSPITAL_COMMUNITY): Payer: Self-pay

## 2022-11-26 ENCOUNTER — Encounter: Payer: Self-pay | Admitting: Student

## 2022-11-26 ENCOUNTER — Ambulatory Visit (INDEPENDENT_AMBULATORY_CARE_PROVIDER_SITE_OTHER): Payer: Medicaid Other | Admitting: Student

## 2022-11-26 ENCOUNTER — Telehealth: Payer: Self-pay | Admitting: Internal Medicine

## 2022-11-26 VITALS — BP 150/70 | HR 83 | Ht 69.0 in | Wt 134.0 lb

## 2022-11-26 DIAGNOSIS — I10 Essential (primary) hypertension: Secondary | ICD-10-CM | POA: Diagnosis present

## 2022-11-26 MED ORDER — BLOOD PRESSURE CUFF MISC
1.0000 | Freq: Every day | 0 refills | Status: AC
  Filled 2022-11-26: qty 1, fill #0

## 2022-11-26 MED ORDER — LOSARTAN POTASSIUM 25 MG PO TABS
25.0000 mg | ORAL_TABLET | Freq: Every day | ORAL | 3 refills | Status: DC
  Filled 2022-11-26 (×2): qty 90, 90d supply, fill #0

## 2022-11-26 NOTE — Assessment & Plan Note (Signed)
Patient's blood pressure today is elevated x 2.  This is most likely due to noncompliance to medication as patient never picked up her losartan prescribed 2 weeks ago.  She is currently asymptomatic. -Encourage patient to start her losartan 25 mg daily -Schedule patient for future lab appointment to check BMP in a week -Rx blood pressure cuff -Encourage patient to keep a daily blood pressure log to be reassessed in 2 weeks -Follow-up in 2 weeks to assess blood pressure after initiation of BP meds

## 2022-11-26 NOTE — Progress Notes (Signed)
    SUBJECTIVE:   CHIEF COMPLAINT / HPI:   Patient is a 51 year old female presenting today for blood pressure follow-up.  She was recently started on losartan for elevated BP.  Patient reports she never picked up her blood pressure medication and has not been taking her daily blood pressure logs.  Denies any headaches, dizziness, shortness of breath or chest pain.  PERTINENT  PMH / PSH: Reviewed   OBJECTIVE:   BP (!) 150/70   Pulse 83   Ht 5\' 9"  (1.753 m)   Wt 134 lb (60.8 kg)   SpO2 98%   BMI 19.79 kg/m    Physical Exam General: Alert, well appearing, NAD, Cardiovascular: RRR, No Murmurs, Normal S2/S2 Respiratory: CTAB, No wheezing or Rales Abdomen: No distension or tenderness Extremities: No edema on extremities     ASSESSMENT/PLAN:   Essential hypertension, benign Patient's blood pressure today is elevated x 2.  This is most likely due to noncompliance to medication as patient never picked up her losartan prescribed 2 weeks ago.  She is currently asymptomatic. -Encourage patient to start her losartan 25 mg daily -Schedule patient for future lab appointment to check BMP in a week -Rx blood pressure cuff -Encourage patient to keep a daily blood pressure log to be reassessed in 2 weeks -Follow-up in 2 weeks to assess blood pressure after initiation of BP meds     Jerre Simon, MD [facility]

## 2022-11-26 NOTE — Telephone Encounter (Signed)
Rescheduled 07/29 appointment to 07/31, confirmed with patient new appointment time/date.

## 2022-11-26 NOTE — Patient Instructions (Signed)
It was wonderful to see you today. Thank you for allowing me to be a part of your care. Below is a short summary of what we discussed at your visit today:  Your blood pressure today is elevated.  Please make sure you go to the pharmacy and pick up your blood pressure medication.  I also recommend you keep a daily blood pressure log if you are able to pick up your blood pressure cuff from the pharmacy.  If he do not have a blood pressure cuff you can always to a pharmacy nearby to use the blood pressure cuff to check your blood pressure.  Please come back in a week to get a lab work completed.  I will see you in 2 weeks to reevaluate your blood pressure.  Please bring all of your medications to every appointment!  If you have any questions or concerns, please do not hesitate to contact us via phone or MyChart message.   Jerre Simon, MD [facility]

## 2022-11-27 ENCOUNTER — Other Ambulatory Visit: Payer: Self-pay

## 2022-11-27 ENCOUNTER — Inpatient Hospital Stay (HOSPITAL_BASED_OUTPATIENT_CLINIC_OR_DEPARTMENT_OTHER): Payer: Medicaid Other | Admitting: Internal Medicine

## 2022-11-27 ENCOUNTER — Other Ambulatory Visit (HOSPITAL_COMMUNITY): Payer: Self-pay

## 2022-11-27 ENCOUNTER — Inpatient Hospital Stay: Payer: Medicaid Other | Attending: Internal Medicine

## 2022-11-27 ENCOUNTER — Inpatient Hospital Stay: Payer: Medicaid Other

## 2022-11-27 VITALS — BP 155/85 | HR 96 | Temp 98.2°F | Resp 17 | Ht 69.0 in | Wt 137.3 lb

## 2022-11-27 DIAGNOSIS — E538 Deficiency of other specified B group vitamins: Secondary | ICD-10-CM

## 2022-11-27 DIAGNOSIS — D539 Nutritional anemia, unspecified: Secondary | ICD-10-CM

## 2022-11-27 LAB — CMP (CANCER CENTER ONLY)
ALT: 8 U/L (ref 0–44)
AST: 15 U/L (ref 15–41)
Albumin: 3.5 g/dL (ref 3.5–5.0)
Alkaline Phosphatase: 152 U/L — ABNORMAL HIGH (ref 38–126)
Anion gap: 6 (ref 5–15)
BUN: 10 mg/dL (ref 6–20)
CO2: 19 mmol/L — ABNORMAL LOW (ref 22–32)
Calcium: 8.6 mg/dL — ABNORMAL LOW (ref 8.9–10.3)
Chloride: 112 mmol/L — ABNORMAL HIGH (ref 98–111)
Creatinine: 1.21 mg/dL — ABNORMAL HIGH (ref 0.44–1.00)
GFR, Estimated: 54 mL/min — ABNORMAL LOW (ref >60)
Glucose, Bld: 94 mg/dL (ref 70–99)
Potassium: 4.1 mmol/L (ref 3.5–5.1)
Sodium: 137 mmol/L (ref 135–145)
Total Bilirubin: 0.2 mg/dL — ABNORMAL LOW (ref 0.3–1.2)
Total Protein: 6.9 g/dL (ref 6.5–8.1)

## 2022-11-27 LAB — CBC WITH DIFFERENTIAL (CANCER CENTER ONLY)
Abs Immature Granulocytes: 0.02 10*3/uL (ref 0.00–0.07)
Basophils Absolute: 0 10*3/uL (ref 0.0–0.1)
Basophils Relative: 1 %
Eosinophils Absolute: 0.3 10*3/uL (ref 0.0–0.5)
Eosinophils Relative: 5 %
HCT: 25.7 % — ABNORMAL LOW (ref 36.0–46.0)
Hemoglobin: 8.6 g/dL — ABNORMAL LOW (ref 12.0–15.0)
Immature Granulocytes: 0 %
Lymphocytes Relative: 31 %
Lymphs Abs: 1.8 10*3/uL (ref 0.7–4.0)
MCH: 30.4 pg (ref 26.0–34.0)
MCHC: 33.5 g/dL (ref 30.0–36.0)
MCV: 90.8 fL (ref 80.0–100.0)
Monocytes Absolute: 0.5 10*3/uL (ref 0.1–1.0)
Monocytes Relative: 8 %
Neutro Abs: 3.2 10*3/uL (ref 1.7–7.7)
Neutrophils Relative %: 55 %
Platelet Count: 185 10*3/uL (ref 150–400)
RBC: 2.83 MIL/uL — ABNORMAL LOW (ref 3.87–5.11)
RDW: 17.2 % — ABNORMAL HIGH (ref 11.5–15.5)
WBC Count: 5.9 10*3/uL (ref 4.0–10.5)
nRBC: 0 % (ref 0.0–0.2)

## 2022-11-27 LAB — IRON AND IRON BINDING CAPACITY (CC-WL,HP ONLY)
Iron: 35 ug/dL (ref 28–170)
Saturation Ratios: 10 % — ABNORMAL LOW (ref 10.4–31.8)
TIBC: 342 ug/dL (ref 250–450)
UIBC: 307 ug/dL (ref 148–442)

## 2022-11-27 LAB — VITAMIN B12: Vitamin B-12: 567 pg/mL (ref 180–914)

## 2022-11-27 MED ORDER — INTEGRA PLUS PO CAPS
1.0000 | ORAL_CAPSULE | Freq: Every day | ORAL | 2 refills | Status: DC
  Filled 2022-11-27: qty 30, 30d supply, fill #0

## 2022-11-27 MED ORDER — INTEGRA PLUS PO CAPS
1.0000 | ORAL_CAPSULE | Freq: Every day | ORAL | 2 refills | Status: AC
  Filled 2022-11-27 – 2023-01-13 (×2): qty 30, 30d supply, fill #0

## 2022-11-27 MED ORDER — CYANOCOBALAMIN 1000 MCG/ML IJ SOLN
1000.0000 ug | Freq: Once | INTRAMUSCULAR | Status: AC
  Administered 2022-11-27: 1000 ug via INTRAMUSCULAR

## 2022-11-27 NOTE — Progress Notes (Signed)
[facility] Telephone:([phone]) [phone]   [phone]) [phone]  OFFICE PROGRESS NOTE  Jerre Simon, MD [address] [place] [address]  DIAGNOSIS:  1) Anemia of chronic disease +/- iron deficiency from gastrointestinal blood loss 2) vitamin B12 deficiency  PRIOR THERAPY: None  CURRENT THERAPY:  1) vitamin B12 1000 mcg intramuscular weekly for 4 weeks followed by monthly treatment. 2) Integra +1 capsule p.o. daily.  INTERVAL HISTORY: Christine [name] 52 y.o. female returns to the clinic today for follow-up visit.  The patient was diagnosed with osteomyelitis status post amputation of the right great toe.  She has a history of alcohol abuse and her previous blood work showed significant vitamin B12 deficiency but the patient did not start oral vitamin B12 as previously recommended.  She was also supposed to start over-the-counter ferrous sulfate with vitamin C but she did not.  She continues to complain of increasing fatigue and weakness.  She has no current nausea, vomiting, diarrhea or constipation.  She has no headache or visual changes.  She has no chest pain, shortness of breath, cough or hemoptysis.  She is here today for evaluation and repeat blood work.  MEDICAL HISTORY: Past Medical History:  Diagnosis Date   Alcohol abuse    Anemia    Angio-edema    Cellulitis 11/2016   RIGHT ARM   Chronic pancreatitis (HCC)    Diabetes mellitus    Esophageal candidiasis (HCC) 03/12/2019   Hypertension    Substance abuse (HCC)     ALLERGIES:  has No Known Allergies.  MEDICATIONS:  Current Outpatient Medications  Medication Sig Dispense Refill   Accu-Chek FastClix Lancets MISC Please use to check blood sugar up to 4 times daily. E11.9 (Patient not taking: Reported on 08/05/2022) 102 each 2   acetaminophen (TYLENOL) 325 MG tablet Take 2 tablets (650 mg total) by mouth every 4 (four) hours as needed for headache or mild pain.     aspirin EC 81 MG tablet Take 1 tablet  (81 mg total) by mouth daily. Swallow whole. 30 tablet 0   blood glucose meter kit and supplies KIT Dispense based on patient and insurance preference. Use up to four times daily as directed. (Patient not taking: Reported on 08/05/2022) 1 each 0   Blood Pressure Monitoring (BLOOD PRESSURE CUFF) MISC Use daily. 1 each 0   calcipotriene (DOVONOX) 0.005 % cream APPLY TOPICALLY DAILY. APPLY DAILY TO AFFECTED AREAS DAILY X 2 WEEKS THEN START CLOBETASOL (Patient taking differently: Apply 1 Application topically 2 (two) times daily. Apply daily to affected areas daily x 2 weeks then start Clobetasol) 60 g 0   calcipotriene (DOVONOX) 0.005 % ointment Apply topically 2 (two) times daily. (Patient taking differently: Apply 1 application  topically 2 (two) times daily.) 60 g 3   chlorhexidine (HIBICLENS) 4 % external liquid Apply topically daily as needed. (Patient not taking: Reported on 10/28/2022) 118 mL 2   CLOBETASOL PROPIONATE E 0.05 % emollient cream APPLY TO AFFECTED AREA TWICE A DAY 60 g 0   clopidogrel (PLAVIX) 75 MG tablet Take 1 tablet (75 mg total) by mouth daily. 30 tablet 0   cyanocobalamin 1000 MCG tablet Take 1 tablet (1,000 mcg total) by mouth daily. 30 tablet 0   FLUoxetine (PROZAC) 20 MG tablet Take 1 tablet (20 mg total) by mouth daily for 7 days, THEN 2 tablets (40 mg total) daily for 21 days. 49 tablet 0   gabapentin (NEURONTIN) 300 MG capsule  TAKE 1 CAPSULE BY MOUTH THREE TIMES A DAY Strength: 300 mg (Patient taking differently: Take 300 mg by mouth 3 (three) times daily.) 270 capsule 1   glucose blood (ACCU-CHEK GUIDE) test strip Please use to check blood sugar up to 4 times daily. E11.9 100 each 12   hydrocortisone 2.5 % lotion Apply topically 2 (two) times daily. Place on sites of rash 2 times daily. (Patient taking differently: Apply 1 application  topically 2 (two) times daily. Place on sites of rash 2 times daily.) 118 mL 0   hydrOXYzine (ATARAX) 25 MG tablet Take 25 mg by mouth every 6  (six) hours as needed for anxiety or itching.     losartan (COZAAR) 25 MG tablet Take 1 tablet (25 mg total) by mouth at bedtime. 90 tablet 3   metFORMIN (GLUCOPHAGE-XR) 500 MG 24 hr tablet Take 1 tablet (500 mg total) by mouth 2 (two) times daily with a meal. (Patient taking differently: Take 1,000 mg by mouth 2 (two) times daily with a meal.) 60 tablet 2   mupirocin ointment (BACTROBAN) 2 % Apply 1 Application topically 2 (two) times daily. Apply to affected area of left foot 30 g 1   rosuvastatin (CRESTOR) 10 MG tablet TAKE 1 TABLET BY MOUTH EVERY DAY 30 tablet 0   Secukinumab, 300 MG Dose, (COSENTYX SENSOREADY, 300 MG,) 150 MG/ML SOAJ Inject 2 mLs (300 mg total) into the skin as directed. On week 0, 1, 2, 3 and 4. (Patient not taking: Reported on 10/28/2022) 10 mL 0   Secukinumab, 300 MG Dose, (COSENTYX SENSOREADY, 300 MG,) 150 MG/ML SOAJ Inject 2 mLs (300 mg total) into the skin every 28 (twenty-eight) days. For maintenance. (Patient not taking: Reported on 10/28/2022) 2 mL 3   triamcinolone ointment (KENALOG) 0.1 % APPLY TOPICALLY 2 TIMES DAILY AS NEEDED. DO NOT USE ON THE FACE, ARM PITS, OR GROIN. (Patient not taking: Reported on 08/13/2022) 454 g 0   triamcinolone ointment (KENALOG) 0.5 % APPLY TO AFFECTED AREA TWICE A DAY 30 g 0   No current facility-administered medications for this visit.    SURGICAL HISTORY:  Past Surgical History:  Procedure Laterality Date   ABDOMINAL AORTOGRAM W/LOWER EXTREMITY N/A 11/01/2022   Procedure: ABDOMINAL AORTOGRAM W/LOWER EXTREMITY;  Surgeon: Nada Libman, MD;  Location: [facility];  Service: Cardiovascular;  Laterality: N/A;   AMPUTATION Left 11/02/2022   Procedure: AMPUTATION RAY;  Surgeon: Louann Sjogren, DPM;  Location: [facility] OR;  Service: Orthopedics/Podiatry;  Laterality: Left;   BREAST REDUCTION SURGERY      REVIEW OF SYSTEMS:  A comprehensive review of systems was negative except for: Constitutional: positive for fatigue   PHYSICAL  EXAMINATION: General appearance: alert, appears stated age, fatigued, and no distress Head: Normocephalic, without obvious abnormality, atraumatic Neck: no adenopathy, no JVD, supple, symmetrical, trachea midline, and thyroid not enlarged, symmetric, no tenderness/mass/nodules Lymph nodes: Cervical, supraclavicular, and axillary nodes normal. Resp: clear to auscultation bilaterally Back: symmetric, no curvature. ROM normal. No CVA tenderness. Cardio: regular rate and rhythm, S1, S2 normal, no murmur, click, rub or gallop GI: soft, non-tender; bowel sounds normal; no masses,  no organomegaly Extremities: extremities normal, atraumatic, no cyanosis or edema  ECOG PERFORMANCE STATUS: 1 - Symptomatic but completely ambulatory  Blood pressure (!) 155/85, pulse 96, temperature 98.2 F (36.8 C), temperature source Oral, resp. rate 17, height 5\' 9"  (1.753 m), weight 137 lb 4.8 oz (62.3 kg), SpO2 100%.  LABORATORY DATA: Lab Results  Component Value Date  WBC 5.9 11/27/2022   HGB 8.6 (L) 11/27/2022   HCT 25.7 (L) 11/27/2022   MCV 90.8 11/27/2022   PLT 185 11/27/2022      Chemistry      Component Value Date/Time   NA 132 (L) 11/01/2022 0339   NA 140 06/27/2022 1455   K 4.6 11/01/2022 0339   CL 104 11/01/2022 0339   CO2 19 (L) 11/01/2022 0339   BUN 17 11/01/2022 0339   BUN 12 06/27/2022 1455   CREATININE 1.34 (H) 11/01/2022 0339   CREATININE 0.88 08/31/2014 1143      Component Value Date/Time   CALCIUM 8.8 (L) 11/01/2022 0339   ALKPHOS 167 (H) 10/28/2022 1439   AST 15 10/28/2022 1439   ALT 8 10/28/2022 1439   BILITOT 0.3 10/28/2022 1439   BILITOT <0.2 06/27/2022 1455       RADIOGRAPHIC STUDIES: DG Foot Complete Left  Result Date: 11/11/2022 Please see detailed radiograph report in office note.  DG Foot Complete Left  Result Date: 11/02/2022 CLINICAL DATA:  Postop amputation for osteomyelitis. EXAM: LEFT FOOT - COMPLETE 3+ VIEW COMPARISON:  10/28/2022. FINDINGS: Great toe  and the distal third of the first metatarsal has been resected. There are no areas of bone resorption to suggest residual osteomyelitis. Remaining joints are normally spaced and aligned. Soft tissue swelling overlies the amputated distal first metatarsal. IMPRESSION: 1. Status post amputation of the great toe and distal third of the first metatarsal. 2. No evidence of residual osteomyelitis. Electronically Signed   By: Amie Portland M.D.   On: 11/02/2022 13:13   PERIPHERAL VASCULAR CATHETERIZATION  Result Date: 11/01/2022 Images from the original result were not included. Patient name: Christine [name] MRN: [id number] DOB: 19-Jul-1970 Sex: female 11/01/2022 Pre-operative Diagnosis: Left foot ulcer Post-operative diagnosis:  Same Surgeon:  [name] Procedure Performed:  1.  Ultrasound-guided access, right femoral artery  2.  Aortobifemoral angiogram  3.  Left leg angiogram  4.  Selective injection with catheter in the left popliteal artery  5.  Stent, left superficial femoral and popliteal artery  6.  Conscious sedation, 101 minutes  7.  Closure device, Celt Indications: This is a 52 year old female with limb threatening ischemia and ulceration to the left foot.  She comes in today for angiogram and possible intervention Procedure:  The patient was identified in the holding area and taken to room 8.  The patient was then placed supine on the table and prepped and draped in the usual sterile fashion.  A time out was called.  Conscious sedation was administered with the use of IV fentanyl and Versed under continuous physician and nurse monitoring.  Heart rate, blood pressure, and oxygen saturation were continuously monitored.  Total sedation time was 101 minutes.  Ultrasound was used to evaluate the right common femoral artery.  It was patent .  A digital ultrasound image was acquired.  A micropuncture needle was used to access the right common femoral artery under ultrasound guidance.  An 018 wire was advanced without  resistance and a micropuncture sheath was placed.  The 018 wire was removed and a benson wire was placed.  The micropuncture sheath was exchanged for a 5 french sheath.  An omniflush catheter was advanced over the wire to the level of L-1.  An abdominal angiogram was obtained.  Next, using the omniflush catheter and a benson wire, the aortic bifurcation was crossed and the catheter was placed into theleft external iliac artery and left runoff was obtained.  Findings:  Aortogram: No significant Renal Artery Stenosis Was Visualized.  The Infrarenal Abdominal Aorta Is Widely Patent.  Bilateral Common and external iliac arteries are widely patent.  Bilateral common femoral arteries widely patent.  Right Lower Extremity: Not evaluated due to contrast utilization for left leg intervention  Left Lower Extremity: Left common femoral and profundofemoral artery are widely patent.  There is a flush occlusion of the left superficial femoral artery.  There is reconstitution at the adductor canal.  There is approximately 50% stenosis which is calcified in the below-knee popliteal artery with three-vessel runoff Intervention: After the above images were acquired the decision was made to proceed with intervention.  A 6 French 45 cm sheath was inserted.  The patient was fully heparinized.  Using an 035 Glidewire and quick cross catheter, subintimal recanalization was performed with telemetry in the above-knee popliteal artery which was confirmed with contrast injection with a catheter at this level.  A versa core wire was then placed.  Next the subintimal tract was dilated with a 4 mm balloon.  I then placed overlapping 6 mm Eluvia stents.  The stents were postdilated with a 5 mm balloon and completion imaging was performed.  There still appeared to be irregular lumen at the distal extent of the stents and so I elected to extend this with a 6 x 40 Eluvia postdilated with a 5 mm balloon.  Patient now has inline flow through the  superficial femoral artery.  There is slight encroachment on the profundofemoral artery from her SFA stent which was necessary to treat the ostial component.  There is also about a 50% lesion in the below-knee popliteal artery.  I elected to not address this currently because she has excellent flow to the foot.  She send wires were exchanged out for short 6 and a Celt was used for closure.  She did have a small hematoma which was not related to the closure device Impression:  #1  Occluded left superficial femoral artery, successfully recanalized and treated with overlapping 6 mm Eluvia stents  #2  Approximately 50% below-knee popliteal artery stenosis which is heavily calcified.  I elected not to address this at this time because she had Perfusion to the foot.  Potentially this could be treated at a later date if necessary from an endovascular approach.  #3  Three-vessel runoff with filling of the plantar arch on the left  #4  No significant aortoiliac occlusive disease  V. [name], M.D., [facility] [place] Office: [phone] Pager:  [phone]   VAS [place] ABI WITH/WO TBI  Result Date: 10/29/2022  LOWER EXTREMITY DOPPLER STUDY Patient Name:  Christine [name]  Date of Exam:   10/29/2022 Medical Rec #: [id number]       Accession #:    [id number] Date of Birth: 04-17-1971        Patient Gender: F Patient Age:   34 years Exam Location:  [facility] Procedure:      VAS [place] ABI WITH/WO TBI Referring Phys: REBECCA SIKORA --------------------------------------------------------------------------------  Indications: Ulceration. Left toe. High Risk Factors: Hypertension, hyperlipidemia, Diabetes, current smoker.  Comparison Study: 03-14-2021 Prior ABI w/ TBI Performing Technologist: Jean Rosenthal RDMS RVT  Examination Guidelines: A complete evaluation includes at minimum, Doppler waveform signals and systolic blood pressure reading at the level of bilateral brachial, anterior tibial,  and posterior tibial arteries, when vessel segments are accessible. Bilateral testing is considered an integral part of a complete examination. Photoelectric Plethysmograph (PPG) waveforms and  toe systolic pressure readings are included as required and additional duplex testing as needed. Limited examinations for reoccurring indications may be performed as noted.  ABI Findings: +---------+------------------+-----+----------+--------+ Right    Rt Pressure (mmHg)IndexWaveform  Comment  +---------+------------------+-----+----------+--------+ Brachial 160                    triphasic          +---------+------------------+-----+----------+--------+ PTA      110               0.68 monophasic         +---------+------------------+-----+----------+--------+ DP       127               0.79 monophasic         +---------+------------------+-----+----------+--------+ Great Toe78                0.48 Abnormal           +---------+------------------+-----+----------+--------+ +---------+------------------+-----+-------------------+-------+ Left     Lt Pressure (mmHg)IndexWaveform           Comment +---------+------------------+-----+-------------------+-------+ Brachial 161                    triphasic                  +---------+------------------+-----+-------------------+-------+ PTA      117               0.73 dampened monophasic        +---------+------------------+-----+-------------------+-------+ DP       113               0.70 dampened monophasic        +---------+------------------+-----+-------------------+-------+ Great Toe48                0.30 Abnormal                   +---------+------------------+-----+-------------------+-------+ +-------+-----------+-----------+------------+------------+ ABI/TBIToday's ABIToday's TBIPrevious ABIPrevious TBI +-------+-----------+-----------+------------+------------+ Right  0.79       0.48       0.83         0.62         +-------+-----------+-----------+------------+------------+ Left   0.73       0.30       0.84        0.64         +-------+-----------+-----------+------------+------------+ Right ABIs appear mildly decreased. Left ABIs appear mildly decreased compared to prior study on 03-14-2021.  Summary: Right: Resting right ankle-brachial index indicates moderate right lower extremity arterial disease. The right toe-brachial index is abnormal. Left: Resting left ankle-brachial index indicates moderate left lower extremity arterial disease. The left toe-brachial index is abnormal. *See table(s) above for measurements and observations.  Electronically signed by Waverly Ferrari MD on 10/29/2022 at 5:59:23 PM.    Final    MR FOOT LEFT W WO CONTRAST  Result Date: 10/29/2022 CLINICAL DATA:  Diabetic foot ulcer, evaluate for osteomyelitis. EXAM: MRI OF THE LEFT FOREFOOT WITHOUT AND WITH CONTRAST TECHNIQUE: Multiplanar, multisequence MR imaging of the left forefoot was performed both before and after administration of intravenous contrast. CONTRAST:  6mL GADAVIST GADOBUTROL 1 MMOL/ML IV SOLN COMPARISON:  None Available. FINDINGS: Bones/Joint/Cartilage Soft tissue wound along the plantar aspect of the great toe. Large first MTP joint effusion with erosive changes along the plantar lateral aspect of the base of the first proximal phalanx and bone marrow edema throughout the first proximal phalanx and metatarsal head consistent with septic arthritis and osteomyelitis.  Bone marrow edema throughout the medial and lateral hallux sesamoids with an erosion of the plantar aspect of the lateral hallux sesamoid consistent with osteomyelitis. 1.8 x 1 x 2.2 cm complex peripherally enhancing fluid collection along the plantar aspect of the first MTP joint consistent with an abscess extending distally along the plantar lateral aspect of the great toe and between the first webspace with a 11 x 7 mm abscess. Along the plantar  medial aspect of the first metatarsal neck there is a 10 x 7 mm abscess deep to the adductor tendon. Mild bone marrow edema in the second metatarsal head which may be reactive versus secondary to early osteomyelitis given the adjacent inflammation. No other joint effusion. Ligaments Lateral collateral ligament of the first MTP joint appear torn. Lisfranc ligament is intact. Muscles and Tendons Flexor hallucis longus tendon is attenuated at the level of the first proximal phalanx concerning for a high-grade partial versus complete tear. Peroneal and extensor compartment tendons are intact. Generalized muscle atrophy. Edema in the adductor hallucis brevis concerning for myositis. Soft tissue No fluid collection or hematoma. No soft tissue mass. Cellulitis of the great toe extending into the medial plantar aspect of the forefoot. IMPRESSION: 1. Soft tissue wound along the plantar aspect of the great toe with surrounding cellulitis. Large first MTP joint effusion with erosive changes along the plantar lateral aspect of the base of the first proximal phalanx and bone marrow edema throughout the first proximal phalanx and metatarsal head consistent with septic arthritis and osteomyelitis. 2. Bone marrow edema throughout the medial and lateral hallux sesamoids with an erosion of the plantar aspect of the lateral hallux sesamoid consistent with osteomyelitis. 3. A 1.8 x 1 x 2.2 cm complex peripherally enhancing fluid collection along the plantar aspect of the first MTP joint consistent with an abscess extending distally along the plantar lateral aspect of the great toe and between the first webspace with a 11 x 7 mm abscess. Along the plantar medial aspect of the first metatarsal neck there is a 10 x 7 mm abscess deep to the adductor tendon. Edema in the adductor hallucis brevis concerning for myositis. 4. Mild bone marrow edema in the second metatarsal head which may be reactive versus secondary to early osteomyelitis  given the adjacent inflammation. 5. Flexor hallucis longus tendon is attenuated at the level of the first proximal phalanx concerning for a high-grade partial versus complete tear. Electronically Signed   By: Elige Ko M.D.   On: 10/29/2022 08:54    ASSESSMENT AND PLAN: This is a 51 years old African-American female with alcohol abuse and many other comorbidities who was diagnosed with anemia of chronic disease plus iron deficiency as well as vitamin B12 deficiency.  Her previous iron study was unremarkable 3 months ago but she had severe vitamin B12 deficiency.  It was recommended for the patient to start taking over-the-counter oral iron tablet as well as vitamin B12 but she did not.  Repeat CBC today showed persistent anemia with hemoglobin of 8.6 and hematocrit 25.7 with MCV of 90.8. Iron study, ferritin, vitamin B12 are still pending. I recommended for the patient to start vitamin B12 injection weekly for 4 weeks followed by monthly injection and she will receive the first dose today.  She will also need to start iron with Integra +1 capsule p.o. daily. I will see her back for follow-up visit in 2 months for evaluation and repeat blood work. She was advised to call immediately if she has any  other concerning symptoms in the interval. The patient voices understanding of current disease status and treatment options and is in agreement with the current care plan.  All questions were answered. The patient knows to call the clinic with any problems, questions or concerns. We can certainly see the patient much sooner if necessary.  The total time spent in the appointment was 20 minutes.  Disclaimer: This note was dictated with voice recognition software. Similar sounding words can inadvertently be transcribed and may not be corrected upon review.

## 2022-11-29 ENCOUNTER — Other Ambulatory Visit (HOSPITAL_COMMUNITY): Payer: Self-pay

## 2022-11-29 ENCOUNTER — Encounter: Payer: Self-pay | Admitting: Internal Medicine

## 2022-12-01 ENCOUNTER — Other Ambulatory Visit: Payer: Self-pay | Admitting: Student

## 2022-12-01 DIAGNOSIS — F32A Depression, unspecified: Secondary | ICD-10-CM

## 2022-12-02 ENCOUNTER — Other Ambulatory Visit (HOSPITAL_COMMUNITY): Payer: Self-pay

## 2022-12-02 ENCOUNTER — Encounter: Payer: Self-pay | Admitting: Internal Medicine

## 2022-12-03 ENCOUNTER — Other Ambulatory Visit: Payer: Medicaid Other

## 2022-12-03 ENCOUNTER — Other Ambulatory Visit: Payer: Self-pay

## 2022-12-05 ENCOUNTER — Telehealth: Payer: Self-pay

## 2022-12-05 ENCOUNTER — Other Ambulatory Visit: Payer: Medicaid Other

## 2022-12-05 ENCOUNTER — Other Ambulatory Visit (HOSPITAL_COMMUNITY): Payer: Self-pay

## 2022-12-05 NOTE — Telephone Encounter (Signed)
Pharmacy Patient Advocate Encounter   Received notification from CoverMyMeds that prior authorization for FLUOXETINE TABLETS is required/requested.   TABLETS HAVE HIGHER COPAY. OK'D THE CHANGE TO CAPSULES FOR $4 COPAY. PHARMACY WAS ABLE TO FILL.

## 2022-12-09 ENCOUNTER — Other Ambulatory Visit: Payer: Medicaid Other

## 2022-12-09 DIAGNOSIS — I10 Essential (primary) hypertension: Secondary | ICD-10-CM

## 2022-12-10 ENCOUNTER — Other Ambulatory Visit (HOSPITAL_COMMUNITY): Payer: Self-pay

## 2022-12-10 ENCOUNTER — Ambulatory Visit (INDEPENDENT_AMBULATORY_CARE_PROVIDER_SITE_OTHER): Payer: Medicaid Other | Admitting: Student

## 2022-12-10 VITALS — BP 135/70 | HR 77 | Ht 69.0 in | Wt 137.4 lb

## 2022-12-10 DIAGNOSIS — I10 Essential (primary) hypertension: Secondary | ICD-10-CM

## 2022-12-10 DIAGNOSIS — E1159 Type 2 diabetes mellitus with other circulatory complications: Secondary | ICD-10-CM

## 2022-12-10 DIAGNOSIS — I152 Hypertension secondary to endocrine disorders: Secondary | ICD-10-CM | POA: Diagnosis not present

## 2022-12-10 MED ORDER — LOSARTAN POTASSIUM 25 MG PO TABS
25.0000 mg | ORAL_TABLET | Freq: Every day | ORAL | 3 refills | Status: DC
Start: 2022-12-10 — End: 2023-12-22
  Filled 2022-12-10 – 2023-01-13 (×2): qty 90, 90d supply, fill #0

## 2022-12-10 NOTE — Progress Notes (Addendum)
    SUBJECTIVE:   CHIEF COMPLAINT / HPI:   Christine [name] is a 52 y.o. female  presenting for follow up of blood pressure.   She was seen by PCP on 7/30 and was started on losartan 25 mg daily. BMP was obtained on 8/12 and electrolytes were stable.   Unfortunately the patient has not started her BP medication due to cost. She reports that she is out of work and has been unable to afford the co-pay.   PERTINENT  PMH / PSH: Reviewed and updated   OBJECTIVE:   BP 135/70   Pulse 77   Ht 5\' 9"  (1.753 m)   Wt 137 lb 6.4 oz (62.3 kg)   SpO2 100%   BMI 20.29 kg/m   Well-appearing, no acute distress Cardio: Regular rate, regular rhythm, no murmurs on exam. Pulm: Clear, no wheezing, no crackles. No increased work of breathing Abdominal: bowel sounds present, soft, non-tender, non-distended Extremities: no peripheral edema  Neuro: alert and oriented x3, speech normal in content, no facial asymmetry, strength intact and equal bilaterally in UE and LE, pupils equal and reactive to light.  Psych:  Cognition and judgment appear intact. Alert, communicative  and cooperative with normal attention span and concentration. No apparent delusions, illusions, hallucinations      11/26/2022   10:27 AM 11/12/2022   10:12 AM 07/09/2022    3:56 PM  PHQ9 SCORE ONLY  PHQ-9 Total Score 15 15 13       ASSESSMENT/PLAN:   Hypertension associated with diabetes (HCC) BP stable in the office although not within goal. Sent losartan 25 mg to the [facility] for $5/month co-pay. Instructed the patient to call the office once she picked the medication up and we will schedule her an appointment for follow up.      Glendale Chard, DO  [facility]

## 2022-12-10 NOTE — Assessment & Plan Note (Signed)
BP stable in the office although not within goal. Sent losartan 25 mg to the [facility] for $5/month co-pay. Instructed the patient to call the office once she picked the medication up and we will schedule her an appointment for follow up.

## 2022-12-10 NOTE — Patient Instructions (Addendum)
It was great to see you today!   Future Appointments  Date Time Provider Department Center  12/12/2022  3:30 PM Iva Boop, MD [facility]  12/16/2022  3:45 PM Louann Sjogren, DPM [facility]    Please arrive 15 minutes before your appointment to ensure smooth check in process.    Please call the clinic at [phone] if your symptoms worsen or you have any concerns.  Thank you for allowing me to participate in your care, Dr. Glendale Chard [facility]

## 2022-12-12 ENCOUNTER — Ambulatory Visit: Payer: Medicaid Other | Admitting: Internal Medicine

## 2022-12-13 ENCOUNTER — Other Ambulatory Visit (HOSPITAL_COMMUNITY): Payer: Self-pay

## 2022-12-16 ENCOUNTER — Ambulatory Visit (INDEPENDENT_AMBULATORY_CARE_PROVIDER_SITE_OTHER): Payer: Medicaid Other | Admitting: Podiatry

## 2022-12-16 NOTE — Progress Notes (Unsigned)
Subjective:  Patient ID: Christine [name], female    DOB: 26-Jul-1970,  MRN: [id number]  No chief complaint on file.   DOS: 11/02/22 Procedure: Left partial first ray amputation  52 y.o. female returns for POV#3. Relates doing well.  Review of Systems: Negative except as noted in the HPI. Denies N/V/F/Ch.  Past Medical History:  Diagnosis Date   Alcohol abuse    Anemia    Angio-edema    Cellulitis 11/2016   RIGHT ARM   Chronic pancreatitis (HCC)    Diabetes mellitus    Esophageal candidiasis (HCC) 03/12/2019   Hypertension    Substance abuse (HCC)     Current Outpatient Medications:    Accu-Chek FastClix Lancets MISC, Please use to check blood sugar up to 4 times daily. E11.9 (Patient not taking: Reported on 08/05/2022), Disp: 102 each, Rfl: 2   acetaminophen (TYLENOL) 325 MG tablet, Take 2 tablets (650 mg total) by mouth every 4 (four) hours as needed for headache or mild pain., Disp: , Rfl:    aspirin EC 81 MG tablet, Take 1 tablet (81 mg total) by mouth daily. Swallow whole., Disp: 30 tablet, Rfl: 0   blood glucose meter kit and supplies KIT, Dispense based on patient and insurance preference. Use up to four times daily as directed. (Patient not taking: Reported on 08/05/2022), Disp: 1 each, Rfl: 0   Blood Pressure Monitoring (BLOOD PRESSURE CUFF) MISC, Use daily., Disp: 1 each, Rfl: 0   calcipotriene (DOVONOX) 0.005 % cream, APPLY TOPICALLY DAILY. APPLY DAILY TO AFFECTED AREAS DAILY X 2 WEEKS THEN START CLOBETASOL (Patient taking differently: Apply 1 Application topically 2 (two) times daily. Apply daily to affected areas daily x 2 weeks then start Clobetasol), Disp: 60 g, Rfl: 0   calcipotriene (DOVONOX) 0.005 % ointment, Apply topically 2 (two) times daily. (Patient taking differently: Apply 1 application  topically 2 (two) times daily.), Disp: 60 g, Rfl: 3   chlorhexidine (HIBICLENS) 4 % external liquid, Apply topically daily as needed. (Patient not taking: Reported on 10/28/2022),  Disp: 118 mL, Rfl: 2   CLOBETASOL PROPIONATE E 0.05 % emollient cream, APPLY TO AFFECTED AREA TWICE A DAY, Disp: 60 g, Rfl: 0   clopidogrel (PLAVIX) 75 MG tablet, Take 1 tablet (75 mg total) by mouth daily., Disp: 30 tablet, Rfl: 0   cyanocobalamin 1000 MCG tablet, Take 1 tablet (1,000 mcg total) by mouth daily., Disp: 30 tablet, Rfl: 0   FeFum-FePoly-FA-B Cmp-C-Biot (INTEGRA PLUS) CAPS, Take 1 capsule by mouth daily., Disp: 30 capsule, Rfl: 2   FLUoxetine (PROZAC) 20 MG tablet, Take 2 tablets (40 mg total) by mouth daily., Disp: 180 tablet, Rfl: 2   gabapentin (NEURONTIN) 300 MG capsule, TAKE 1 CAPSULE BY MOUTH THREE TIMES A DAY Strength: 300 mg (Patient taking differently: Take 300 mg by mouth 3 (three) times daily.), Disp: 270 capsule, Rfl: 1   glucose blood (ACCU-CHEK GUIDE) test strip, Please use to check blood sugar up to 4 times daily. E11.9, Disp: 100 each, Rfl: 12   hydrocortisone 2.5 % lotion, Apply topically 2 (two) times daily. Place on sites of rash 2 times daily. (Patient taking differently: Apply 1 application  topically 2 (two) times daily. Place on sites of rash 2 times daily.), Disp: 118 mL, Rfl: 0   hydrOXYzine (ATARAX) 25 MG tablet, Take 25 mg by mouth every 6 (six) hours as needed for anxiety or itching., Disp: , Rfl:    losartan (COZAAR) 25 MG tablet, Take 1 tablet (25 mg  total) by mouth at bedtime., Disp: 90 tablet, Rfl: 3   metFORMIN (GLUCOPHAGE-XR) 500 MG 24 hr tablet, Take 1 tablet (500 mg total) by mouth 2 (two) times daily with a meal. (Patient taking differently: Take 1,000 mg by mouth 2 (two) times daily with a meal.), Disp: 60 tablet, Rfl: 2   mupirocin ointment (BACTROBAN) 2 %, Apply 1 Application topically 2 (two) times daily. Apply to affected area of left foot, Disp: 30 g, Rfl: 1   rosuvastatin (CRESTOR) 10 MG tablet, TAKE 1 TABLET BY MOUTH EVERY DAY, Disp: 30 tablet, Rfl: 0   Secukinumab, 300 MG Dose, (COSENTYX SENSOREADY, 300 MG,) 150 MG/ML SOAJ, Inject 2 mLs (300  mg total) into the skin as directed. On week 0, 1, 2, 3 and 4. (Patient not taking: Reported on 10/28/2022), Disp: 10 mL, Rfl: 0   Secukinumab, 300 MG Dose, (COSENTYX SENSOREADY, 300 MG,) 150 MG/ML SOAJ, Inject 2 mLs (300 mg total) into the skin every 28 (twenty-eight) days. For maintenance. (Patient not taking: Reported on 10/28/2022), Disp: 2 mL, Rfl: 3   triamcinolone ointment (KENALOG) 0.1 %, APPLY TOPICALLY 2 TIMES DAILY AS NEEDED. DO NOT USE ON THE FACE, ARM PITS, OR GROIN. (Patient not taking: Reported on 08/13/2022), Disp: 454 g, Rfl: 0   triamcinolone ointment (KENALOG) 0.5 %, APPLY TO AFFECTED AREA TWICE A DAY, Disp: 30 g, Rfl: 0  Social History   Tobacco Use  Smoking Status Every Day   Current packs/day: 1.00   Average packs/day: 1 pack/day for 22.0 years (22.0 ttl pk-yrs)   Types: Cigarettes   Passive exposure: Current  Smokeless Tobacco Never  Tobacco Comments   working on quitting    No Known Allergies Objective:  There were no vitals filed for this visit. There is no height or weight on file to calculate BMI. Constitutional Well developed. Well nourished.  Vascular Foot warm and well perfused. Capillary refill normal to all digits.   Neurologic Normal speech. Oriented to person, place, and time. Epicritic sensation to light touch grossly present bilaterally.  Dermatologic Skin healing well without signs of infection. Skin edges well coapted without signs of infection. Venous insufficiency changes noted with edema.   Orthopedic: Tenderness to palpation noted about the surgical site.   Radiographs: Interval resection of first ray Assessment:   1. Post-operative state   2. Type 2 diabetes mellitus with diabetic neuropathy, with long-term current use of insulin (HCC)     Plan:  Patient was evaluated and treated and all questions answered.  S/p foot surgery left -Progressing as expected post-operatively. -WB Status: WBAT in regular shoe -Will get set up for diabetic  shoes  -Medications: n/a -Advised to keep legs elevated and use compression.  -Foot redressed.  Follow-up in 6 weeks for rfc.     No follow-ups on file.

## 2022-12-17 ENCOUNTER — Encounter: Payer: Self-pay | Admitting: Student

## 2022-12-19 ENCOUNTER — Other Ambulatory Visit (HOSPITAL_COMMUNITY): Payer: Self-pay

## 2022-12-25 ENCOUNTER — Other Ambulatory Visit (HOSPITAL_COMMUNITY): Payer: Self-pay

## 2022-12-25 ENCOUNTER — Ambulatory Visit (INDEPENDENT_AMBULATORY_CARE_PROVIDER_SITE_OTHER): Payer: Medicaid Other | Admitting: Podiatry

## 2022-12-25 ENCOUNTER — Encounter: Payer: Self-pay | Admitting: Podiatry

## 2022-12-25 DIAGNOSIS — Z794 Long term (current) use of insulin: Secondary | ICD-10-CM

## 2022-12-25 DIAGNOSIS — Z9889 Other specified postprocedural states: Secondary | ICD-10-CM

## 2022-12-25 DIAGNOSIS — Z899 Acquired absence of limb, unspecified: Secondary | ICD-10-CM

## 2022-12-25 DIAGNOSIS — E1140 Type 2 diabetes mellitus with diabetic neuropathy, unspecified: Secondary | ICD-10-CM

## 2022-12-25 MED ORDER — KETOCONAZOLE 2 % EX CREA
1.0000 | TOPICAL_CREAM | Freq: Every day | CUTANEOUS | 2 refills | Status: AC
  Filled 2022-12-25: qty 60, 30d supply, fill #0
  Filled 2023-01-13: qty 60, 20d supply, fill #0

## 2022-12-25 NOTE — Progress Notes (Signed)
Subjective:  Patient ID: Christine [name], female    DOB: 1970/08/31,  MRN: [id number]  Chief Complaint  Patient presents with   Routine Post Op    Pt  returns for POV#3 dos 7.6.2024/  left ray amputation pt stated that she is doing well and managing pain.     DOS: 11/02/22 Procedure: Left partial first ray amputation  52 y.o. female returns for POV#3. Relates doing well and managing pain.   Review of Systems: Negative except as noted in the HPI. Denies N/V/F/Ch.  Past Medical History:  Diagnosis Date   Alcohol abuse    Anemia    Angio-edema    Bacteremia, escherichia coli    Cellulitis 11/2016   RIGHT ARM   Chronic pancreatitis (HCC)    Diabetes mellitus    Esophageal candidiasis (HCC) 03/12/2019   Hyperkalemia 02/09/2020   Hypertension    Leukocytosis    Long term (current) use of systemic steroids 05/12/2019   Starting Taper 05/11/2019:   10mg  QOD with 2.5mg  QOD for 2 months>  7.5mg  QOD with 2.5 mg QOD for 2 months>  7.5mg  qday for 2 months>  5mg  qday for 2 months>  4mg  qday for 2 months>  3mg  qday for 2 months>  2mg  qday for 2 months>  1mg  qday for 2 months>     Then Trial OFF     If any of the following may happen and we could reevaluate taper if these are moderate/severe.  Severe fatigue  Weakness  B   Shortness of breath 07/15/2019   Substance abuse (HCC)     Current Outpatient Medications:    ketoconazole (NIZORAL) 2 % cream, Apply 1 Application topically daily., Disp: 60 g, Rfl: 2   Accu-Chek FastClix Lancets MISC, Please use to check blood sugar up to 4 times daily. E11.9 (Patient not taking: Reported on 08/05/2022), Disp: 102 each, Rfl: 2   acetaminophen (TYLENOL) 325 MG tablet, Take 2 tablets (650 mg total) by mouth every 4 (four) hours as needed for headache or mild pain., Disp: , Rfl:    aspirin EC 81 MG tablet, Take 1 tablet (81 mg total) by mouth daily. Swallow whole., Disp: 30 tablet, Rfl: 0   blood glucose meter kit and supplies KIT, Dispense based on patient and  insurance preference. Use up to four times daily as directed. (Patient not taking: Reported on 08/05/2022), Disp: 1 each, Rfl: 0   Blood Pressure Monitoring (BLOOD PRESSURE CUFF) MISC, Use daily., Disp: 1 each, Rfl: 0   calcipotriene (DOVONOX) 0.005 % cream, APPLY TOPICALLY DAILY. APPLY DAILY TO AFFECTED AREAS DAILY X 2 WEEKS THEN START CLOBETASOL (Patient taking differently: Apply 1 Application topically 2 (two) times daily. Apply daily to affected areas daily x 2 weeks then start Clobetasol), Disp: 60 g, Rfl: 0   calcipotriene (DOVONOX) 0.005 % ointment, Apply topically 2 (two) times daily. (Patient taking differently: Apply 1 application  topically 2 (two) times daily.), Disp: 60 g, Rfl: 3   chlorhexidine (HIBICLENS) 4 % external liquid, Apply topically daily as needed. (Patient not taking: Reported on 10/28/2022), Disp: 118 mL, Rfl: 2   CLOBETASOL PROPIONATE E 0.05 % emollient cream, APPLY TO AFFECTED AREA TWICE A DAY, Disp: 60 g, Rfl: 0   clopidogrel (PLAVIX) 75 MG tablet, Take 1 tablet (75 mg total) by mouth daily., Disp: 30 tablet, Rfl: 0   cyanocobalamin 1000 MCG tablet, Take 1 tablet (1,000 mcg total) by mouth daily., Disp: 30 tablet, Rfl: 0   FeFum-FePoly-FA-B Cmp-C-Biot (INTEGRA  PLUS) CAPS, Take 1 capsule by mouth daily., Disp: 30 capsule, Rfl: 2   FLUoxetine (PROZAC) 20 MG tablet, Take 2 tablets (40 mg total) by mouth daily., Disp: 180 tablet, Rfl: 2   gabapentin (NEURONTIN) 300 MG capsule, TAKE 1 CAPSULE BY MOUTH THREE TIMES A DAY Strength: 300 mg (Patient taking differently: Take 300 mg by mouth 3 (three) times daily.), Disp: 270 capsule, Rfl: 1   glucose blood (ACCU-CHEK GUIDE) test strip, Please use to check blood sugar up to 4 times daily. E11.9, Disp: 100 each, Rfl: 12   hydrocortisone 2.5 % lotion, Apply topically 2 (two) times daily. Place on sites of rash 2 times daily. (Patient taking differently: Apply 1 application  topically 2 (two) times daily. Place on sites of rash 2 times  daily.), Disp: 118 mL, Rfl: 0   hydrOXYzine (ATARAX) 25 MG tablet, Take 25 mg by mouth every 6 (six) hours as needed for anxiety or itching., Disp: , Rfl:    losartan (COZAAR) 25 MG tablet, Take 1 tablet (25 mg total) by mouth at bedtime., Disp: 90 tablet, Rfl: 3   metFORMIN (GLUCOPHAGE-XR) 500 MG 24 hr tablet, Take 1 tablet (500 mg total) by mouth 2 (two) times daily with a meal. (Patient taking differently: Take 1,000 mg by mouth 2 (two) times daily with a meal.), Disp: 60 tablet, Rfl: 2   mupirocin ointment (BACTROBAN) 2 %, Apply 1 Application topically 2 (two) times daily. Apply to affected area of left foot, Disp: 30 g, Rfl: 1   rosuvastatin (CRESTOR) 10 MG tablet, TAKE 1 TABLET BY MOUTH EVERY DAY, Disp: 30 tablet, Rfl: 0   Secukinumab, 300 MG Dose, (COSENTYX SENSOREADY, 300 MG,) 150 MG/ML SOAJ, Inject 2 mLs (300 mg total) into the skin as directed. On week 0, 1, 2, 3 and 4. (Patient not taking: Reported on 10/28/2022), Disp: 10 mL, Rfl: 0   Secukinumab, 300 MG Dose, (COSENTYX SENSOREADY, 300 MG,) 150 MG/ML SOAJ, Inject 2 mLs (300 mg total) into the skin every 28 (twenty-eight) days. For maintenance. (Patient not taking: Reported on 10/28/2022), Disp: 2 mL, Rfl: 3   triamcinolone ointment (KENALOG) 0.1 %, APPLY TOPICALLY 2 TIMES DAILY AS NEEDED. DO NOT USE ON THE FACE, ARM PITS, OR GROIN. (Patient not taking: Reported on 08/13/2022), Disp: 454 g, Rfl: 0   triamcinolone ointment (KENALOG) 0.5 %, APPLY TO AFFECTED AREA TWICE A DAY, Disp: 30 g, Rfl: 0  Social History   Tobacco Use  Smoking Status Every Day   Current packs/day: 1.00   Average packs/day: 1 pack/day for 22.0 years (22.0 ttl pk-yrs)   Types: Cigarettes   Passive exposure: Current  Smokeless Tobacco Never  Tobacco Comments   working on quitting    No Known Allergies Objective:  There were no vitals filed for this visit. There is no height or weight on file to calculate BMI. Constitutional Well developed. Well nourished.   Vascular Foot warm and well perfused. Capillary refill normal to all digits.   Neurologic Normal speech. Oriented to person, place, and time. Epicritic sensation to light touch grossly present bilaterally.  Dermatologic Skin healing well without signs of infection. Skin edges well coapted without signs of infection. Venous insufficiency changes noted with edema. Scaling noted to plantar feet bilateral  Orthopedic: Tenderness to palpation noted about the surgical site.   Radiographs: Interval resection of first ray Assessment:   1. Type 2 diabetes mellitus with diabetic neuropathy, with long-term current use of insulin (HCC)   2. Post-operative state  3. History of amputation     Plan:  Patient was evaluated and treated and all questions answered.  S/p foot surgery left -Progressing as expected post-operatively. -WB Status: WBAT in regular shoe -Medications: n/a -Advised to keep legs elevated and use compression.  -Will get set up for possible diabetic shoes.  -Ketoconazole sent for possible tinea pedis.   Follow-up in 3 months for rfc.     No follow-ups on file.

## 2023-01-06 ENCOUNTER — Other Ambulatory Visit: Payer: Self-pay

## 2023-01-06 DIAGNOSIS — L989 Disorder of the skin and subcutaneous tissue, unspecified: Secondary | ICD-10-CM

## 2023-01-07 ENCOUNTER — Other Ambulatory Visit (HOSPITAL_COMMUNITY): Payer: Self-pay

## 2023-01-07 MED ORDER — CYANOCOBALAMIN 1000 MCG PO TABS: 1000.0000 ug | ORAL_TABLET | Freq: Every day | ORAL | 0 refills | Status: DC

## 2023-01-07 MED ORDER — GABAPENTIN 300 MG PO CAPS: ORAL_CAPSULE | ORAL | 1 refills | Status: DC

## 2023-01-07 MED ORDER — TRIAMCINOLONE ACETONIDE 0.5 % EX OINT
TOPICAL_OINTMENT | Freq: Two times a day (BID) | CUTANEOUS | 0 refills | Status: AC
Start: 2023-01-07 — End: ?

## 2023-01-07 MED ORDER — METFORMIN HCL ER 500 MG PO TB24: 500.0000 mg | ORAL_TABLET | Freq: Two times a day (BID) | ORAL | 2 refills | Status: DC

## 2023-01-07 MED ORDER — CLOPIDOGREL BISULFATE 75 MG PO TABS: 75.0000 mg | ORAL_TABLET | Freq: Every day | ORAL | 0 refills | Status: DC

## 2023-01-13 ENCOUNTER — Ambulatory Visit: Payer: Medicaid Other

## 2023-01-13 ENCOUNTER — Other Ambulatory Visit (HOSPITAL_COMMUNITY): Payer: Self-pay

## 2023-01-13 DIAGNOSIS — E1140 Type 2 diabetes mellitus with diabetic neuropathy, unspecified: Secondary | ICD-10-CM

## 2023-01-13 DIAGNOSIS — Z9889 Other specified postprocedural states: Secondary | ICD-10-CM

## 2023-01-13 DIAGNOSIS — M869 Osteomyelitis, unspecified: Secondary | ICD-10-CM

## 2023-01-13 DIAGNOSIS — Z899 Acquired absence of limb, unspecified: Secondary | ICD-10-CM

## 2023-01-13 NOTE — Progress Notes (Signed)
Patient presents to the office today for diabetic shoe and insole measuring.  Patient was measured with brannock device to determine size and width for 1 pair of extra depth shoes and foam casted for 3 pair of insoles.   Documentation of medical necessity will be sent to patient's treating diabetic doctor to verify and sign.   Patient's diabetic provider: Jerre Simon MD  Shoes and insoles will be ordered at that time and patient will be notified for an appointment for fitting when they arrive.   Shoe size (per patient): 10 Brannock measurement: 10.5 Patient shoe selection- Shoe choice:   A600W / A300W Shoe size ordered: 65M ABN and Financials Signed  Kinlee Garrison Cped, CFo, CFm

## 2023-01-16 ENCOUNTER — Encounter: Payer: Self-pay | Admitting: Internal Medicine

## 2023-01-24 ENCOUNTER — Encounter: Payer: Self-pay | Admitting: Internal Medicine

## 2023-02-04 ENCOUNTER — Other Ambulatory Visit: Payer: Self-pay | Admitting: Student

## 2023-02-11 ENCOUNTER — Other Ambulatory Visit: Payer: Self-pay | Admitting: Nephrology

## 2023-02-11 DIAGNOSIS — N1831 Chronic kidney disease, stage 3a: Secondary | ICD-10-CM

## 2023-02-13 ENCOUNTER — Inpatient Hospital Stay: Admission: RE | Admit: 2023-02-13 | Payer: 59 | Source: Ambulatory Visit

## 2023-02-18 ENCOUNTER — Telehealth: Payer: Self-pay

## 2023-02-18 NOTE — Telephone Encounter (Signed)
Patient called for status on diabetic shoes.

## 2023-02-19 ENCOUNTER — Ambulatory Visit
Admission: RE | Admit: 2023-02-19 | Discharge: 2023-02-19 | Disposition: A | Discharge status: Home / Self Care | Payer: 59 | Source: Ambulatory Visit | Attending: Nephrology | Admitting: Nephrology

## 2023-02-19 DIAGNOSIS — N1831 Chronic kidney disease, stage 3a: Secondary | ICD-10-CM

## 2023-03-07 ENCOUNTER — Encounter: Payer: Self-pay | Admitting: Internal Medicine

## 2023-03-14 ENCOUNTER — Other Ambulatory Visit: Payer: 59

## 2023-03-20 ENCOUNTER — Ambulatory Visit (INDEPENDENT_AMBULATORY_CARE_PROVIDER_SITE_OTHER): Payer: 59

## 2023-03-20 DIAGNOSIS — E1140 Type 2 diabetes mellitus with diabetic neuropathy, unspecified: Secondary | ICD-10-CM

## 2023-03-20 DIAGNOSIS — Z899 Acquired absence of limb, unspecified: Secondary | ICD-10-CM | POA: Diagnosis not present

## 2023-03-20 DIAGNOSIS — Z794 Long term (current) use of insulin: Secondary | ICD-10-CM

## 2023-03-25 NOTE — Progress Notes (Signed)
Patient presents today to pick up diabetic shoes and insoles.  Patient was dispensed 1 pair of diabetic shoes and 3 right inserts and 1 left TF Fit was satisfactory. Instructions for break-in and wear was reviewed and a copy was given to the patient.   Re-appointment for regularly scheduled diabetic foot care visits or if they should experience any trouble with the shoes or insoles.  Addison Bailey Cped, CFo, CFm

## 2023-03-26 ENCOUNTER — Ambulatory Visit: Payer: Medicaid Other | Admitting: Podiatry

## 2023-03-26 DIAGNOSIS — Z91199 Patient's noncompliance with other medical treatment and regimen due to unspecified reason: Secondary | ICD-10-CM

## 2023-03-26 NOTE — Progress Notes (Signed)
No show

## 2023-03-31 ENCOUNTER — Telehealth: Payer: Self-pay | Admitting: Podiatry

## 2023-03-31 NOTE — Telephone Encounter (Signed)
Pt lvm on 11/29 at 927am stating she had a appt today but the office is closed.  I returned call. ( Pts appt was 11/27 Wednesday) and her vm is full.

## 2023-04-15 ENCOUNTER — Encounter: Payer: Self-pay | Admitting: Podiatry

## 2023-04-15 ENCOUNTER — Ambulatory Visit (INDEPENDENT_AMBULATORY_CARE_PROVIDER_SITE_OTHER): Payer: 59 | Admitting: Podiatry

## 2023-04-15 DIAGNOSIS — Z899 Acquired absence of limb, unspecified: Secondary | ICD-10-CM

## 2023-04-15 DIAGNOSIS — Z794 Long term (current) use of insulin: Secondary | ICD-10-CM

## 2023-04-15 DIAGNOSIS — L309 Dermatitis, unspecified: Secondary | ICD-10-CM | POA: Diagnosis not present

## 2023-04-15 DIAGNOSIS — E1140 Type 2 diabetes mellitus with diabetic neuropathy, unspecified: Secondary | ICD-10-CM

## 2023-04-15 NOTE — Progress Notes (Signed)
Patient presents to the office for what she calls psoriasis on her lower legs.  She also had amputation of big toe left foot. By Dr.  Ralene Cork.  She has white crusty skin lesions up her lower legs. I discussed this condition and since the lower legs are her concern  I recommended she see her medical doctor.  I believe she needs to get a diagnosis and then be treated properly.   GAM

## 2023-04-18 ENCOUNTER — Ambulatory Visit: Payer: 59 | Admitting: Student

## 2023-06-11 ENCOUNTER — Encounter: Payer: Self-pay | Admitting: Student

## 2023-06-11 ENCOUNTER — Other Ambulatory Visit (HOSPITAL_COMMUNITY): Payer: Self-pay

## 2023-06-11 ENCOUNTER — Ambulatory Visit (INDEPENDENT_AMBULATORY_CARE_PROVIDER_SITE_OTHER): Payer: 59 | Admitting: Student

## 2023-06-11 VITALS — BP 126/72 | HR 92 | Wt 133.4 lb

## 2023-06-11 DIAGNOSIS — L409 Psoriasis, unspecified: Secondary | ICD-10-CM | POA: Diagnosis not present

## 2023-06-11 MED ORDER — DOXYCYCLINE HYCLATE 100 MG PO TABS
100.0000 mg | ORAL_TABLET | Freq: Two times a day (BID) | ORAL | 0 refills | Status: AC
  Filled 2023-06-11 (×2): qty 14, 7d supply, fill #0

## 2023-06-11 MED ORDER — CETIRIZINE HCL 10 MG PO TABS
10.0000 mg | ORAL_TABLET | Freq: Every day | ORAL | 11 refills | Status: AC
  Filled 2023-06-11 (×2): qty 30, 30d supply, fill #0

## 2023-06-11 MED ORDER — CLOBETASOL PROPIONATE E 0.05 % EX CREA
TOPICAL_CREAM | CUTANEOUS | 0 refills | Status: DC
  Filled 2023-06-11: qty 60, 30d supply, fill #0
  Filled 2023-06-11: qty 60, fill #0

## 2023-06-11 MED ORDER — AMOXICILLIN-POT CLAVULANATE 875-125 MG PO TABS
1.0000 | ORAL_TABLET | Freq: Two times a day (BID) | ORAL | 0 refills | Status: AC
  Filled 2023-06-11 (×2): qty 20, 10d supply, fill #0

## 2023-06-11 NOTE — Progress Notes (Cosign Needed Addendum)
    SUBJECTIVE:   CHIEF COMPLAINT / HPI:   53 year old female with history of psoriasis and chronic contact dermatitis presenting today for ongoing rash.  She is concerned about psoriasis flareup.  Endorses severe itchiness, draining and intermittent pain associated with the rash on both lower extremities.  Previously seen dermatologist who has tried multiple treatment regimen that have worked in the past.  However patient has not seen dermatologist in a while due to loss of contact with them.  She has an appointment coming up with dermatology in 5 days.  Denies any fevers or chills and currently using only her triamcinolone cream.  She says she is out of most of her medications from the dermatologist.  Patient unsure of her current treatment regimens.  PERTINENT  PMH / PSH: Reviewed   OBJECTIVE:   BP 126/72   Pulse 92   Wt 133 lb 6.4 oz (60.5 kg)   SpO2 98%   BMI 19.70 kg/m    Physical Exam General: Alert, well appearing, NAD Cardiovascular: RRR, No Murmurs, Normal S2/S2 Extremities: No edema on extremities   Skin: Severe scaly, hyperpigment lesion with notable skin break down and drainage on BLE.       ASSESSMENT/PLAN:   Psoriasis Does appear to have severe flareup of her psoriasis likely due to gap in treatment as it appears patient was lost to follow up with her dermatologist after  she realized the clinic had changed office. Will treat with antibiotic given suspicious of superimposed skin infection with draining wound. Patient will also need high potency topical steroid. -Rx doxycycline, and added Augmentin for Pseudomonas coverage in DM patient. -Rx Zyrtec for excessive pruritus -Reviewed patient's clobetasol -Strongly advised follow-up with dermatology.   Jerre Simon, MD [facility] [place]

## 2023-06-11 NOTE — Patient Instructions (Signed)
Pleasure to see you today.  Given your psoriasis flareup I have sent in stronger steroid cream for the rash, and Zyrtec's which you take daily for the itchiness.  I have also sent in an antibiotic as I am concerned you may also have some infection of the rash which is causing the drainage.  You will take doxycycline 100 mg 2 times daily for 7 days.  Also please make sure to follow-up with your dermatology for further management.

## 2023-06-11 NOTE — Assessment & Plan Note (Signed)
Does appear to have severe flareup of her psoriasis likely due to gap in treatment as it appears patient was lost to follow up with her dermatologist after  she realized the clinic had changed office. Will treat with antibiotic given suspicious of superimposed skin infection with draining wound. Patient will also need high potency topical steroid. -Rx doxycycline, and added Augmentin for Pseudomonas coverage in DM patient. -Rx Zyrtec for excessive pruritus -Reviewed patient's clobetasol -Strongly advised follow-up with dermatology.

## 2023-06-12 ENCOUNTER — Other Ambulatory Visit (HOSPITAL_COMMUNITY): Payer: Self-pay

## 2023-06-12 ENCOUNTER — Encounter: Payer: Self-pay | Admitting: Student

## 2023-06-16 ENCOUNTER — Other Ambulatory Visit (HOSPITAL_COMMUNITY): Payer: Self-pay

## 2023-06-16 ENCOUNTER — Encounter: Payer: Self-pay | Admitting: Dermatology

## 2023-06-16 ENCOUNTER — Ambulatory Visit (INDEPENDENT_AMBULATORY_CARE_PROVIDER_SITE_OTHER): Payer: 59 | Admitting: Dermatology

## 2023-06-16 DIAGNOSIS — L409 Psoriasis, unspecified: Secondary | ICD-10-CM | POA: Diagnosis not present

## 2023-06-16 MED ORDER — CALCIPOTRIENE 0.005 % EX CREA
TOPICAL_CREAM | Freq: Every day | CUTANEOUS | 0 refills | Status: DC
  Filled 2023-06-16: qty 60, fill #0

## 2023-06-16 MED ORDER — CLOBETASOL PROPIONATE E 0.05 % EX CREA
TOPICAL_CREAM | CUTANEOUS | 0 refills | Status: AC
  Filled 2023-06-16: qty 60, fill #0
  Filled 2023-06-16: qty 60, 30d supply, fill #0

## 2023-06-16 MED ORDER — CALCIPOTRIENE 0.005 % EX CREA
TOPICAL_CREAM | Freq: Every day | CUTANEOUS | 0 refills | Status: AC
  Filled 2023-06-16: qty 60, 14d supply, fill #0

## 2023-06-16 MED ORDER — CLOBETASOL PROPIONATE E 0.05 % EX CREA
TOPICAL_CREAM | CUTANEOUS | 0 refills | Status: DC
  Filled 2023-06-16: qty 60, fill #0

## 2023-06-16 MED ORDER — COSENTYX SENSOREADY (300 MG) 150 MG/ML ~~LOC~~ SOAJ: 300.0000 mg | SUBCUTANEOUS | 3 refills | Status: AC

## 2023-06-16 MED ORDER — COSENTYX SENSOREADY (300 MG) 150 MG/ML ~~LOC~~ SOAJ: 300.0000 mg | SUBCUTANEOUS | 0 refills | Status: AC

## 2023-06-16 NOTE — Patient Instructions (Addendum)
[facility] [place], [place] - [address] Phone: [phone]  Fax: [phone]      Hello Courteney,  Thank you for visiting [place] today.   Here is a summary of the key instructions from today's consultation:  Medications:   Clobetasol Ointment: Continue applying twice daily for 2 weeks.   Calcipotriene Cream: After using Clobetasol, switch to this cream for 2 weeks. Alternate between the two until your skin improves.   Cosentyx: We have resubmitted your prescription. Once you manage the $3 copay through [facility], start the injections as instructed: weekly at first, then bi-weekly, and eventually monthly.  Payment Assistance: Please have your fianc assist with the copay so you can start your Cosentyx treatment.  Follow-Up:   Bring the Cosentyx to the clinic once received. We will teach you how to administer the injections.   Schedule a follow-up appointment via MyChart once you have the medication.   We have refilled your prescriptions for both Clobetasol and Calcipotriene and are here to assist you with any questions or concerns you might have. Please do not hesitate to reach out through MyChart.  Looking forward to seeing you again and wishing you a smooth recovery.  Warm regards,  Dr. Langston [facility]     Important Information  Due to recent changes in healthcare laws, you may see results of your pathology and/or laboratory studies on MyChart before the doctors have had a chance to review them. We understand that in some cases there may be results that are confusing or concerning to you. Please understand that not all results are received at the same time and often the doctors may need to interpret multiple results in order to provide you with the best plan of care or course of treatment. Therefore, we ask that you please give [place] 2 business days to thoroughly review all your results before contacting the office for clarification. Should we see a critical  lab result, you will be contacted sooner.   If You Need Anything After Your Visit  If you have any questions or concerns for your doctor, please call our main line at [phone] If no one answers, please leave a voicemail as directed and we will return your call as soon as possible. Messages left after 4 pm will be answered the following business day.   You may also send [place] a message via MyChart. We typically respond to MyChart messages within 1-2 business days.  For prescription refills, please ask your pharmacy to contact our office. Our fax number is [phone].  If you have an urgent issue when the clinic is closed that cannot wait until the next business day, you can page your doctor at the number below.    Please note that while we do our best to be available for urgent issues outside of office hours, we are not available 24/7.   If you have an urgent issue and are unable to reach [place], you may choose to seek medical care at your doctor's office, retail clinic, urgent care center, or emergency room.  If you have a medical emergency, please immediately call 911 or go to the emergency department. In the event of inclement weather, please call our main line at [phone] for an update on the status of any delays or closures.  Dermatology Medication Tips: Please keep the boxes that topical medications come in in order to help keep track of the instructions about where and how to use these. Pharmacies typically print the medication  instructions only on the boxes and not directly on the medication tubes.   If your medication is too expensive, please contact our office at [phone] or send [place] a message through MyChart.   We are unable to tell what your co-pay for medications will be in advance as this is different depending on your insurance coverage. However, we may be able to find a substitute medication at lower cost or fill out paperwork to get insurance to cover a needed  medication.   If a prior authorization is required to get your medication covered by your insurance company, please allow [place] 1-2 business days to complete this process.  Drug prices often vary depending on where the prescription is filled and some pharmacies may offer cheaper prices.  The website www.goodrx.com contains coupons for medications through different pharmacies. The prices here do not account for what the cost may be with help from insurance (it may be cheaper with your insurance), but the website can give you the price if you did not use any insurance.  - You can print the associated coupon and take it with your prescription to the pharmacy.  - You may also stop by our office during regular business hours and pick up a GoodRx coupon card.  - If you need your prescription sent electronically to a different pharmacy, notify our office through [facility] or by phone at [phone]

## 2023-06-16 NOTE — Progress Notes (Signed)
   Follow-Up Visit   Subjective  Christine [name] is a 53 y.o. female who presents for the following: Psoriasis  Patient present today for follow up visit. Patient was last evaluated on 10/07/22. At this visit patient was prescribed mupirocin (psoriasis skin infection). Patient reports sxs are better however, the issue comes back. Patient denies medication changes. Pt mentioned since last OV she has had left L big toe amputated.   The following portions of the chart were reviewed this encounter and updated as appropriate: medications, allergies, medical history  Review of Systems:  No other skin or systemic complaints except as noted in HPI or Assessment and Plan.  Objective  Well appearing patient in no apparent distress; mood and affect are within normal limits.   A focused examination was performed of the following areas: bilateral lower legs   Relevant exam findings are noted in the Assessment and Plan.           Assessment & Plan   PSORIASIS Exam: Well-demarcated erythematous papules/plaques with silvery scale, guttate scaly papules. 60% BSA.  flared  Psoriasis is a chronic non-curable, but treatable genetic/hereditary disease that may have other systemic features affecting other organ systems such as joints (Psoriatic Arthritis). It is associated with an increased risk of inflammatory bowel disease, heart disease, non-alcoholic fatty liver disease, and depression.  Treatments include light and laser treatments; topical medications; and systemic medications including oral and injectables.  Treatment Plan:   Refill clobetasol ointment, to be applied twice daily for 2 weeks.   Alternate with calcipotriene cream for 2 weeks.   Continue alternating treatments until skin is smooth, focusing on arms and hands.   Resend Cosentyx prescription to [employer].   Patient to arrange $3 copay payment with fianc's assistance.   Once Cosentyx is obtained, schedule an appointment  for injection training.   Follow the Cosentyx regimen: weekly, then bi-weekly, finally monthly.   Follow up in 6 months.   Patient to message via MyChart for any issues or once Cosentyx is received.    No follow-ups on file.    Documentation: I have reviewed the above documentation for accuracy and completeness, and I agree with the above.   I, Shirron Marcha Solders, CMA, am acting as scribe for [employer], DO.   Langston Reusing, DO

## 2023-06-17 ENCOUNTER — Other Ambulatory Visit (HOSPITAL_COMMUNITY): Payer: Self-pay

## 2023-07-31 ENCOUNTER — Encounter: Payer: Self-pay | Admitting: Internal Medicine

## 2023-07-31 ENCOUNTER — Other Ambulatory Visit: Payer: Self-pay

## 2023-07-31 ENCOUNTER — Emergency Department (HOSPITAL_COMMUNITY)
Admission: EM | Admit: 2023-07-31 | Discharge: 2023-08-01 | Disposition: A | Discharge status: Home / Self Care | Attending: Emergency Medicine | Admitting: Emergency Medicine

## 2023-07-31 ENCOUNTER — Encounter (HOSPITAL_COMMUNITY): Payer: Self-pay

## 2023-07-31 DIAGNOSIS — Y906 Blood alcohol level of 120-199 mg/100 ml: Secondary | ICD-10-CM | POA: Insufficient documentation

## 2023-07-31 DIAGNOSIS — E119 Type 2 diabetes mellitus without complications: Secondary | ICD-10-CM | POA: Insufficient documentation

## 2023-07-31 DIAGNOSIS — F10129 Alcohol abuse with intoxication, unspecified: Secondary | ICD-10-CM | POA: Diagnosis present

## 2023-07-31 DIAGNOSIS — I4581 Long QT syndrome: Secondary | ICD-10-CM | POA: Insufficient documentation

## 2023-07-31 DIAGNOSIS — Z79899 Other long term (current) drug therapy: Secondary | ICD-10-CM | POA: Insufficient documentation

## 2023-07-31 DIAGNOSIS — Z7984 Long term (current) use of oral hypoglycemic drugs: Secondary | ICD-10-CM | POA: Diagnosis not present

## 2023-07-31 DIAGNOSIS — Z7902 Long term (current) use of antithrombotics/antiplatelets: Secondary | ICD-10-CM | POA: Diagnosis not present

## 2023-07-31 DIAGNOSIS — R9431 Abnormal electrocardiogram [ECG] [EKG]: Secondary | ICD-10-CM

## 2023-07-31 DIAGNOSIS — F17200 Nicotine dependence, unspecified, uncomplicated: Secondary | ICD-10-CM | POA: Insufficient documentation

## 2023-07-31 DIAGNOSIS — I10 Essential (primary) hypertension: Secondary | ICD-10-CM | POA: Insufficient documentation

## 2023-07-31 DIAGNOSIS — F10929 Alcohol use, unspecified with intoxication, unspecified: Secondary | ICD-10-CM

## 2023-07-31 DIAGNOSIS — Z7982 Long term (current) use of aspirin: Secondary | ICD-10-CM | POA: Diagnosis not present

## 2023-07-31 LAB — CBC WITH DIFFERENTIAL/PLATELET
Abs Immature Granulocytes: 0.01 10*3/uL (ref 0.00–0.07)
Basophils Absolute: 0 10*3/uL (ref 0.0–0.1)
Basophils Relative: 1 %
Eosinophils Absolute: 0.1 10*3/uL (ref 0.0–0.5)
Eosinophils Relative: 3 %
HCT: 24.8 % — ABNORMAL LOW (ref 36.0–46.0)
Hemoglobin: 8.2 g/dL — ABNORMAL LOW (ref 12.0–15.0)
Immature Granulocytes: 0 %
Lymphocytes Relative: 43 %
Lymphs Abs: 1.5 10*3/uL (ref 0.7–4.0)
MCH: 31.1 pg (ref 26.0–34.0)
MCHC: 33.1 g/dL (ref 30.0–36.0)
MCV: 93.9 fL (ref 80.0–100.0)
Monocytes Absolute: 0.3 10*3/uL (ref 0.1–1.0)
Monocytes Relative: 9 %
Neutro Abs: 1.5 10*3/uL — ABNORMAL LOW (ref 1.7–7.7)
Neutrophils Relative %: 44 %
Platelets: 126 10*3/uL — ABNORMAL LOW (ref 150–400)
RBC: 2.64 MIL/uL — ABNORMAL LOW (ref 3.87–5.11)
RDW: 16.4 % — ABNORMAL HIGH (ref 11.5–15.5)
WBC: 3.5 10*3/uL — ABNORMAL LOW (ref 4.0–10.5)
nRBC: 0 % (ref 0.0–0.2)

## 2023-07-31 LAB — COMPREHENSIVE METABOLIC PANEL WITH GFR
ALT: 29 U/L (ref 0–44)
AST: 45 U/L — ABNORMAL HIGH (ref 15–41)
Albumin: 2.8 g/dL — ABNORMAL LOW (ref 3.5–5.0)
Alkaline Phosphatase: 154 U/L — ABNORMAL HIGH (ref 38–126)
Anion gap: 11 (ref 5–15)
BUN: 12 mg/dL (ref 6–20)
CO2: 15 mmol/L — ABNORMAL LOW (ref 22–32)
Calcium: 7.4 mg/dL — ABNORMAL LOW (ref 8.9–10.3)
Chloride: 107 mmol/L (ref 98–111)
Creatinine, Ser: 1.58 mg/dL — ABNORMAL HIGH (ref 0.44–1.00)
GFR, Estimated: 39 mL/min — ABNORMAL LOW (ref >60)
Glucose, Bld: 179 mg/dL — ABNORMAL HIGH (ref 70–99)
Potassium: 3.1 mmol/L — ABNORMAL LOW (ref 3.5–5.1)
Sodium: 133 mmol/L — ABNORMAL LOW (ref 135–145)
Total Bilirubin: 0.4 mg/dL (ref 0.0–1.2)
Total Protein: 5.5 g/dL — ABNORMAL LOW (ref 6.5–8.1)

## 2023-07-31 LAB — MAGNESIUM: Magnesium: 1.5 mg/dL — ABNORMAL LOW (ref 1.7–2.4)

## 2023-07-31 LAB — ETHANOL: Alcohol, Ethyl (B): 164 mg/dL — ABNORMAL HIGH (ref <10)

## 2023-07-31 MED ORDER — LACTATED RINGERS IV BOLUS
1000.0000 mL | Freq: Once | INTRAVENOUS | Status: AC
  Administered 2023-08-01: 1000 mL via INTRAVENOUS

## 2023-07-31 NOTE — ED Provider Notes (Signed)
 [place] EMERGENCY DEPARTMENT AT [facility] Provider Note   CSN: [id number] Arrival date & time: 07/31/23  1947     History  Chief Complaint  Patient presents with   Alcohol Intoxication    Christine [name] is a 53 y.o. female.  HPI    Pt comes in with cc of alcohol intoxication/altered mental status. Patient has history of psoriasis, hypertension and diabetes.  Her hospital list also includes history of alcohol use disorder and substance use disorder.  Patient brought to the emergency room after she was found unresponsive by her boyfriend.  Patient was lying supine on the floor.  Per EMS, patient was unresponsive, but breathing spontaneously and had a pulse.  Patient received Narcan, and she did respond.   Patient arousable during my assessment.  She states that she has been drinking alcohol today.  She denies any substance use.  EMS has given patient fluid bolus.  Patient denies any pain.  I called patient's husband at [phone], but there was no response.  Home Medications Prior to Admission medications   Medication Sig Start Date End Date Taking? Authorizing Provider  Accu-Chek FastClix Lancets MISC Please use to check blood sugar up to 4 times daily. E11.9 03/14/22   Moses Manners, MD  acetaminophen (TYLENOL) 325 MG tablet Take 2 tablets (650 mg total) by mouth every 4 (four) hours as needed for headache or mild pain. 11/02/22   Glendale Chard, DO  amoxicillin-clavulanate (AUGMENTIN) 875-125 MG tablet Take 1 tablet by mouth 2 (two) times daily. 06/11/23   Jerre Simon, MD  aspirin EC 81 MG tablet Take 1 tablet (81 mg total) by mouth daily. Swallow whole. 11/03/22   Glendale Chard, DO  blood glucose meter kit and supplies KIT Dispense based on patient and insurance preference. Use up to four times daily as directed. 03/13/22   Vonna Drafts, MD  Blood Pressure Monitoring (BLOOD PRESSURE CUFF) MISC Use daily. 11/26/22   Jerre Simon, MD  calcipotriene (DOVONOX) 0.005 %  cream Apply topically daily to affected areas for 2 weeks, then start clobetasol. 06/16/23   Terri Piedra, DO  calcipotriene (DOVONOX) 0.005 % ointment Apply topically 2 (two) times daily. Patient taking differently: Apply 1 application  topically 2 (two) times daily. 09/03/22   Terri Piedra, DO  cetirizine (ZYRTEC) 10 MG tablet Take 1 tablet (10 mg total) by mouth daily. 06/11/23   Jerre Simon, MD  chlorhexidine (HIBICLENS) 4 % external liquid Apply topically daily as needed. 09/03/22   Terri Piedra, DO  Clobetasol Prop Emollient Base (CLOBETASOL PROPIONATE E) 0.05 % emollient cream Apply on the affected area 2 times daily. 06/16/23   Terri Piedra, DO  clopidogrel (PLAVIX) 75 MG tablet TAKE 1 TABLET BY MOUTH EVERY DAY 02/04/23   Jerre Simon, MD  [employer] VITAMIN B12 1000 MCG tablet TAKE 1 TABLET BY MOUTH EVERY DAY 02/04/23   Jerre Simon, MD  FeFum-FePoly-FA-B Cmp-C-Biot (INTEGRA PLUS) CAPS Take 1 capsule by mouth daily. 11/27/22   Si Gaul, MD  FLUoxetine (PROZAC) 20 MG tablet Take 2 tablets (40 mg total) by mouth daily. 12/02/22 08/29/23  Jerre Simon, MD  gabapentin (NEURONTIN) 300 MG capsule TAKE 1 CAPSULE BY MOUTH THREE TIMES A DAY Strength: 300 mg 01/07/23   Jerre Simon, MD  glucose blood (ACCU-CHEK GUIDE) test strip Please use to check blood sugar up to 4 times daily. E11.9 03/14/22   Moses Manners, MD  hydrocortisone 2.5 % lotion Apply topically 2 (two)  times daily. Place on sites of rash 2 times daily. Patient taking differently: Apply 1 application  topically 2 (two) times daily. Place on sites of rash 2 times daily. 05/14/22   Gerhard Munch, MD  hydrOXYzine (ATARAX) 25 MG tablet Take 25 mg by mouth every 6 (six) hours as needed for anxiety or itching. 09/04/22   [provider]  ketoconazole (NIZORAL) 2 % cream Apply to affected area daily. 12/25/22   Louann Sjogren, DPM  losartan (COZAAR) 25 MG tablet Take 1 tablet (25 mg total) by mouth at bedtime. 12/10/22    Glendale Chard, DO  metFORMIN (GLUCOPHAGE-XR) 500 MG 24 hr tablet Take 1 tablet (500 mg total) by mouth 2 (two) times daily with a meal. 01/07/23 04/07/23  Jerre Simon, MD  mupirocin ointment (BACTROBAN) 2 % Apply 1 Application topically 2 (two) times daily. Apply to affected area of left foot 10/07/22   Terri Piedra, DO  rosuvastatin (CRESTOR) 10 MG tablet TAKE 1 TABLET BY MOUTH EVERY DAY 11/14/22   Maeola Harman, MD  Secukinumab, 300 MG Dose, (COSENTYX SENSOREADY, 300 MG,) 150 MG/ML SOAJ Inject 2 mLs (300 mg total) into the skin every 28 (twenty-eight) days. For maintenance. 06/16/23   Terri Piedra, DO  Secukinumab, 300 MG Dose, (COSENTYX SENSOREADY, 300 MG,) 150 MG/ML SOAJ Inject 2 mLs (300 mg total) into the skin as directed. On week 0, 1, 2, 3 and 4. 06/16/23   Terri Piedra, DO  triamcinolone ointment (KENALOG) 0.1 % APPLY TOPICALLY 2 TIMES DAILY AS NEEDED. DO NOT USE ON THE FACE, ARM PITS, OR GROIN. 07/22/22   Alfonse Spruce, MD  triamcinolone ointment (KENALOG) 0.5 % Apply topically 2 (two) times daily. 01/07/23   Jerre Simon, MD      Allergies    Patient has no known allergies.    Review of Systems   Review of Systems  All other systems reviewed and are negative.   Physical Exam Updated Vital Signs BP 114/74   Pulse 73   Temp (!) 97.2 F (36.2 C) (Oral)   Resp 12   SpO2 100%  Physical Exam Vitals and nursing note reviewed.  Constitutional:      Appearance: She is well-developed.     Comments: Somnolent, arousable  HENT:     Head: Atraumatic.  Eyes:     Comments: Pupils are 3 mm, equal  Cardiovascular:     Rate and Rhythm: Normal rate.  Pulmonary:     Effort: Pulmonary effort is normal.  Abdominal:     Tenderness: There is no abdominal tenderness.  Musculoskeletal:     Cervical back: Neck supple.  Skin:    General: Skin is warm and dry.  Neurological:     Comments: Oriented to self, location and year     ED Results / Procedures /  Treatments   Labs (all labs ordered are listed, but only abnormal results are displayed) Labs Reviewed  COMPREHENSIVE METABOLIC PANEL WITH GFR - Abnormal; Notable for the following components:      Result Value   Sodium 133 (*)    Potassium 3.1 (*)    CO2 15 (*)    Glucose, Bld 179 (*)    Creatinine, Ser 1.58 (*)    Calcium 7.4 (*)    Total Protein 5.5 (*)    Albumin 2.8 (*)    AST 45 (*)    Alkaline Phosphatase 154 (*)    GFR, Estimated 39 (*)    All other components  within normal limits  CBC WITH DIFFERENTIAL/PLATELET - Abnormal; Notable for the following components:   WBC 3.5 (*)    RBC 2.64 (*)    Hemoglobin 8.2 (*)    HCT 24.8 (*)    RDW 16.4 (*)    Platelets 126 (*)    Neutro Abs 1.5 (*)    All other components within normal limits  ETHANOL - Abnormal; Notable for the following components:   Alcohol, Ethyl (B) 164 (*)    All other components within normal limits  MAGNESIUM - Abnormal; Notable for the following components:   Magnesium 1.5 (*)    All other components within normal limits  CK  RAPID URINE DRUG SCREEN, HOSP PERFORMED  BASIC METABOLIC PANEL WITH GFR  I-STAT VENOUS BLOOD GAS, ED    EKG EKG Interpretation Date/Time:  Thursday July 31 2023 19:59:03 EDT Ventricular Rate:  86 PR Interval:  182 QRS Duration:  86 QT Interval:  465 QTC Calculation: 557 R Axis:   60  Text Interpretation: Sinus rhythm Probable LVH with secondary repol abnrm Anterior Q waves, possibly due to LVH Prolonged QT interval prologed QT noted Confirmed by Derwood Kaplan [phone]) on 07/31/2023 8:51:16 PM  Radiology No results found.  Procedures Procedures    Medications Ordered in ED Medications  lactated ringers bolus 1,000 mL (has no administration in time range)  potassium chloride SA (KLOR-CON M) CR tablet 40 mEq (has no administration in time range)    ED Course/ Medical Decision Making/ A&P                                 Medical Decision Making Amount and/or  Complexity of Data Reviewed Labs: ordered.  This patient presents to the ED with chief complaint(s) of unresponsiveness with pertinent past medical history of alcohol use disorder, tobacco use disorder, hypertension and diabetes, psoriasis..The complaint involves an extensive differential diagnosis and also carries with it a high risk of complications and morbidity.    The differential diagnosis includes : Traumatic brain injury, seizures, syncope, intoxication from alcohol, toxicity from substance use, withdrawals. Other possibilities include dehydration.  The initial plan is to get basic labs, EKG. Patient allegedly responded to Narcan.  She has pupils are 3 mm.  I have reviewed [place] controlled substance database, patient is taking gabapentin regularly.  It is possible that there could be some gabapentin toxicity given mixed hypnotics/sedative presentation.  Other possibilities include TBI. However, patient is moving all 4 extremities, she is answering all the questions appropriately and denying any headaches.  My suspicion for brain bleed is quite low.   Additional history obtained: Additional history obtained from EMS  Records reviewed Primary Care Documents  Independent labs interpretation:  The following labs were independently interpreted: Patient has low bicarb of 15.  She has slightly low potassium. Patient is EKG shows prolonged QT.  Treatment and Reassessment: Plan is to give patient IV fluids.  Oral potassium has been ordered.  Magnesium, VBG are pending at this time.  Repeat BMP for 2 AM has been ordered. There is some prolonged QT on the initial EKG.   Patient's care has been signed out to incoming team.  Plan is to follow-up on repeat BMP and to ensure that patient has passed oral challenge and becomes sober.  She can be discharged when clinically sober.    Final Clinical Impression(s) / ED Diagnoses Final diagnoses:  Alcoholic intoxication with  complication (  HCC)  Prolonged Q-T interval on ECG    Rx / DC Orders ED Discharge Orders     None         Derwood Kaplan, MD 08/01/23 [phone]

## 2023-07-31 NOTE — ED Provider Notes (Incomplete)
 11:52 PM Assumed care of patient from off-going team. For more details, please see note from same day.  In brief, this is a 53 y.o. female found unresponsive by family member. Did receive narcan and seemed to respond some. Known alcoholic, admitted to alcohol. EtOH 164 at 9 pm.   Plan/Dispo at time of sign-out & ED Course since sign-out: [ ]  VBG [ ]  repeat BMP after fluid  BP 114/74   Pulse 73   Temp (!) 97.2 F (36.2 C) (Oral)   Resp 12   SpO2 100%    ED Course:      Dispo:  ------------------------------- Vivi Barrack, MD Emergency Medicine  This note was created using dictation software, which may contain spelling or grammatical errors.   Loetta Rough, MD 08/01/23 [phone]

## 2023-07-31 NOTE — ED Notes (Signed)
 Lab called to inform of orders placed for save lab sample previously sent

## 2023-07-31 NOTE — ED Triage Notes (Signed)
 Pt BIB [facility] from home where she was found by here boyfriend unresponsive lying supine on the floor. EMS stated when they arrived she initially was not responsive but was breathing and had a pulse. They gave 0.5 of Narcan and as they started to push the Narcan pt started to arouse. Pt is a known alcoholic and states she has been drinking all day. Pt is also requesting food at this time. EMS did give 1L of NS.

## 2023-07-31 NOTE — ED Notes (Signed)
 Christine [name] [phone] would like an update asap

## 2023-07-31 NOTE — ED Provider Notes (Incomplete)
 [place] EMERGENCY DEPARTMENT AT [facility] Provider Note   CSN: [id number] Arrival date & time: 07/31/23  1947     History {Add pertinent medical, surgical, social history, OB history to HPI:1} Chief Complaint  Patient presents with  . Alcohol Intoxication    Christine [name] is a 53 y.o. female.  HPI    Pt comes in with cc of alcohol intoxication/altered mental status. Patient has history of psoriasis, hypertension and diabetes.  Her hospital list also includes history of alcohol use disorder and substance use disorder.  Patient brought to the emergency room after she was found unresponsive by her boyfriend.  Patient was lying supine on the floor.  Per EMS, patient was unresponsive, but breathing spontaneously and had a pulse.  Patient received Narcan, and she did respond.   Patient arousable during my assessment.  She states that she has been drinking alcohol today.  She denies any substance use.  EMS has given patient fluid bolus.  Patient denies any pain.  I called patient's husband at [phone], but there was no response.  Home Medications Prior to Admission medications   Medication Sig Start Date End Date Taking? Authorizing Provider  Accu-Chek FastClix Lancets MISC Please use to check blood sugar up to 4 times daily. E11.9 03/14/22   Moses Manners, MD  acetaminophen (TYLENOL) 325 MG tablet Take 2 tablets (650 mg total) by mouth every 4 (four) hours as needed for headache or mild pain. 11/02/22   Glendale Chard, DO  amoxicillin-clavulanate (AUGMENTIN) 875-125 MG tablet Take 1 tablet by mouth 2 (two) times daily. 06/11/23   Jerre Simon, MD  aspirin EC 81 MG tablet Take 1 tablet (81 mg total) by mouth daily. Swallow whole. 11/03/22   Glendale Chard, DO  blood glucose meter kit and supplies KIT Dispense based on patient and insurance preference. Use up to four times daily as directed. 03/13/22   Vonna Drafts, MD  Blood Pressure Monitoring (BLOOD PRESSURE CUFF) MISC  Use daily. 11/26/22   Jerre Simon, MD  calcipotriene (DOVONOX) 0.005 % cream Apply topically daily to affected areas for 2 weeks, then start clobetasol. 06/16/23   Terri Piedra, DO  calcipotriene (DOVONOX) 0.005 % ointment Apply topically 2 (two) times daily. Patient taking differently: Apply 1 application  topically 2 (two) times daily. 09/03/22   Terri Piedra, DO  cetirizine (ZYRTEC) 10 MG tablet Take 1 tablet (10 mg total) by mouth daily. 06/11/23   Jerre Simon, MD  chlorhexidine (HIBICLENS) 4 % external liquid Apply topically daily as needed. 09/03/22   Terri Piedra, DO  Clobetasol Prop Emollient Base (CLOBETASOL PROPIONATE E) 0.05 % emollient cream Apply on the affected area 2 times daily. 06/16/23   Terri Piedra, DO  clopidogrel (PLAVIX) 75 MG tablet TAKE 1 TABLET BY MOUTH EVERY DAY 02/04/23   Jerre Simon, MD  [employer]S VITAMIN B12 1000 MCG tablet TAKE 1 TABLET BY MOUTH EVERY DAY 02/04/23   Jerre Simon, MD  FeFum-FePoly-FA-B Cmp-C-Biot (INTEGRA PLUS) CAPS Take 1 capsule by mouth daily. 11/27/22   Si Gaul, MD  FLUoxetine (PROZAC) 20 MG tablet Take 2 tablets (40 mg total) by mouth daily. 12/02/22 08/29/23  Jerre Simon, MD  gabapentin (NEURONTIN) 300 MG capsule TAKE 1 CAPSULE BY MOUTH THREE TIMES A DAY Strength: 300 mg 01/07/23   Jerre Simon, MD  glucose blood (ACCU-CHEK GUIDE) test strip Please use to check blood sugar up to 4 times daily. E11.9 03/14/22   Moses Manners, MD  hydrocortisone 2.5 % lotion Apply topically 2 (two) times daily. Place on sites of rash 2 times daily. Patient taking differently: Apply 1 application  topically 2 (two) times daily. Place on sites of rash 2 times daily. 05/14/22   Gerhard Munch, MD  hydrOXYzine (ATARAX) 25 MG tablet Take 25 mg by mouth every 6 (six) hours as needed for anxiety or itching. 09/04/22   [provider]  ketoconazole (NIZORAL) 2 % cream Apply to affected area daily. 12/25/22   Louann Sjogren, DPM  losartan  (COZAAR) 25 MG tablet Take 1 tablet (25 mg total) by mouth at bedtime. 12/10/22   Glendale Chard, DO  metFORMIN (GLUCOPHAGE-XR) 500 MG 24 hr tablet Take 1 tablet (500 mg total) by mouth 2 (two) times daily with a meal. 01/07/23 04/07/23  Jerre Simon, MD  mupirocin ointment (BACTROBAN) 2 % Apply 1 Application topically 2 (two) times daily. Apply to affected area of left foot 10/07/22   Terri Piedra, DO  rosuvastatin (CRESTOR) 10 MG tablet TAKE 1 TABLET BY MOUTH EVERY DAY 11/14/22   Maeola Harman, MD  Secukinumab, 300 MG Dose, (COSENTYX SENSOREADY, 300 MG,) 150 MG/ML SOAJ Inject 2 mLs (300 mg total) into the skin every 28 (twenty-eight) days. For maintenance. 06/16/23   Terri Piedra, DO  Secukinumab, 300 MG Dose, (COSENTYX SENSOREADY, 300 MG,) 150 MG/ML SOAJ Inject 2 mLs (300 mg total) into the skin as directed. On week 0, 1, 2, 3 and 4. 06/16/23   Terri Piedra, DO  triamcinolone ointment (KENALOG) 0.1 % APPLY TOPICALLY 2 TIMES DAILY AS NEEDED. DO NOT USE ON THE FACE, ARM PITS, OR GROIN. 07/22/22   Alfonse Spruce, MD  triamcinolone ointment (KENALOG) 0.5 % Apply topically 2 (two) times daily. 01/07/23   Jerre Simon, MD      Allergies    Patient has no known allergies.    Review of Systems   Review of Systems  All other systems reviewed and are negative.   Physical Exam Updated Vital Signs BP 114/74   Pulse 73   Temp (!) 97.2 F (36.2 C) (Oral)   Resp 12   SpO2 100%  Physical Exam Vitals and nursing note reviewed.  Constitutional:      Appearance: She is well-developed.     Comments: Somnolent, arousable  HENT:     Head: Atraumatic.  Eyes:     Comments: Pupils are 3 mm, equal  Cardiovascular:     Rate and Rhythm: Normal rate.  Pulmonary:     Effort: Pulmonary effort is normal.  Abdominal:     Tenderness: There is no abdominal tenderness.  Musculoskeletal:     Cervical back: Neck supple.  Skin:    General: Skin is warm and dry.  Neurological:      Comments: Oriented to self, location and year     ED Results / Procedures / Treatments   Labs (all labs ordered are listed, but only abnormal results are displayed) Labs Reviewed  COMPREHENSIVE METABOLIC PANEL WITH GFR - Abnormal; Notable for the following components:      Result Value   Sodium 133 (*)    Potassium 3.1 (*)    CO2 15 (*)    Glucose, Bld 179 (*)    Creatinine, Ser 1.58 (*)    Calcium 7.4 (*)    Total Protein 5.5 (*)    Albumin 2.8 (*)    AST 45 (*)    Alkaline Phosphatase 154 (*)    GFR, Estimated  39 (*)    All other components within normal limits  CBC WITH DIFFERENTIAL/PLATELET - Abnormal; Notable for the following components:   WBC 3.5 (*)    RBC 2.64 (*)    Hemoglobin 8.2 (*)    HCT 24.8 (*)    RDW 16.4 (*)    Platelets 126 (*)    Neutro Abs 1.5 (*)    All other components within normal limits  ETHANOL - Abnormal; Notable for the following components:   Alcohol, Ethyl (B) 164 (*)    All other components within normal limits  MAGNESIUM - Abnormal; Notable for the following components:   Magnesium 1.5 (*)    All other components within normal limits  CK  RAPID URINE DRUG SCREEN, HOSP PERFORMED  BASIC METABOLIC PANEL WITH GFR  I-STAT VENOUS BLOOD GAS, ED    EKG EKG Interpretation Date/Time:  Thursday July 31 2023 19:59:03 EDT Ventricular Rate:  86 PR Interval:  182 QRS Duration:  86 QT Interval:  465 QTC Calculation: 557 R Axis:   60  Text Interpretation: Sinus rhythm Probable LVH with secondary repol abnrm Anterior Q waves, possibly due to LVH Prolonged QT interval prologed QT noted Confirmed by Derwood Kaplan [phone]) on 07/31/2023 8:51:16 PM  Radiology No results found.  Procedures Procedures  {Document cardiac monitor, telemetry assessment procedure when appropriate:1}  Medications Ordered in ED Medications  lactated ringers bolus 1,000 mL (has no administration in time range)    ED Course/ Medical Decision Making/ A&P   {    Click here for ABCD2, HEART and other calculatorsREFRESH Note before signing :1}                              Medical Decision Making Amount and/or Complexity of Data Reviewed Labs: ordered.    Final Clinical Impression(s) / ED Diagnoses Final diagnoses:  Alcoholic intoxication with complication (HCC)    Rx / DC Orders ED Discharge Orders     None

## 2023-08-01 LAB — I-STAT VENOUS BLOOD GAS, ED
Acid-base deficit: 8 mmol/L — ABNORMAL HIGH (ref 0.0–2.0)
Bicarbonate: 18.7 mmol/L — ABNORMAL LOW (ref 20.0–28.0)
Calcium, Ion: 1.15 mmol/L (ref 1.15–1.40)
HCT: 28 % — ABNORMAL LOW (ref 36.0–46.0)
Hemoglobin: 9.5 g/dL — ABNORMAL LOW (ref 12.0–15.0)
O2 Saturation: 83 %
Potassium: 4.1 mmol/L (ref 3.5–5.1)
Sodium: 138 mmol/L (ref 135–145)
TCO2: 20 mmol/L — ABNORMAL LOW (ref 22–32)
pCO2, Ven: 41.8 mmHg — ABNORMAL LOW (ref 44–60)
pH, Ven: 7.258 (ref 7.25–7.43)
pO2, Ven: 55 mmHg — ABNORMAL HIGH (ref 32–45)

## 2023-08-01 LAB — BASIC METABOLIC PANEL WITH GFR
Anion gap: 9 (ref 5–15)
BUN: 11 mg/dL (ref 6–20)
CO2: 16 mmol/L — ABNORMAL LOW (ref 22–32)
Calcium: 7.8 mg/dL — ABNORMAL LOW (ref 8.9–10.3)
Chloride: 111 mmol/L (ref 98–111)
Creatinine, Ser: 1.6 mg/dL — ABNORMAL HIGH (ref 0.44–1.00)
GFR, Estimated: 39 mL/min — ABNORMAL LOW (ref >60)
Glucose, Bld: 89 mg/dL (ref 70–99)
Potassium: 4 mmol/L (ref 3.5–5.1)
Sodium: 136 mmol/L (ref 135–145)

## 2023-08-01 LAB — RAPID URINE DRUG SCREEN, HOSP PERFORMED
Amphetamines: NOT DETECTED
Barbiturates: NOT DETECTED
Benzodiazepines: NOT DETECTED
Cocaine: NOT DETECTED
Opiates: NOT DETECTED
Tetrahydrocannabinol: NOT DETECTED

## 2023-08-01 LAB — CK: Total CK: 85 U/L (ref 38–234)

## 2023-08-01 MED ORDER — POTASSIUM CHLORIDE CRYS ER 20 MEQ PO TBCR
40.0000 meq | EXTENDED_RELEASE_TABLET | Freq: Once | ORAL | Status: AC
  Administered 2023-08-01: 40 meq via ORAL
  Filled 2023-08-01: qty 2

## 2023-08-01 MED ORDER — MAGNESIUM OXIDE -MG SUPPLEMENT 400 (240 MG) MG PO TABS
400.0000 mg | ORAL_TABLET | Freq: Once | ORAL | Status: AC
  Administered 2023-08-01: 400 mg via ORAL
  Filled 2023-08-01: qty 1

## 2023-08-01 NOTE — Discharge Instructions (Signed)
 You were seen in the ER for unresponsiveness.  We suspect that your symptoms could be because of alcohol use.  However workup in the ER is overall reassuring besides some electrolyte abnormality, dehydration and EKG changes.  Refrain from heavy alcohol use.  Please return to the ER if your symptoms worsen; you have increased pain, fevers, chills, inability to keep any medications down, confusion. Otherwise see the outpatient doctor as requested.

## 2023-08-01 NOTE — ED Notes (Addendum)
 Pt ambulated to the restroom with minimal assistance. Pt also was given something to drink.

## 2023-08-01 NOTE — ED Provider Notes (Signed)
  Physical Exam  BP 114/74   Pulse 73   Temp (!) 97.2 F (36.2 C) (Oral)   Resp 12   SpO2 100%   Physical Exam  Procedures  Procedures  ED Course / MDM    Medical Decision Making Amount and/or Complexity of Data Reviewed Labs: ordered.  Risk OTC drugs. Prescription drug management.   Patient care handed off from Dr. Rhunette Croft at shift change.  See prior note for more full details.  In short, 53 year old female presents emergency department after being found unresponsive by her boyfriend on the floor.  Was found supine.  EMS received Narcan with response.  Patient reports drinking a lot of alcohol today with substance use.  Patient ANO x 3 with not any complaint of pain.  Plan is to reassess BMP, EKG after IV fluids, electrolyte replacement and time for patient to metabolize.  Reassessment of the patient showed significant improvement of mental status.  Able to sit up and have a full length conversation.  Able to ambulate independently without difficulty.  Passing p.o. trial.  Repeat metabolic panel showed improvement of electrolyte derangements.  Repeat EKG showed improvement of QT interval.  Will recommend abstinence from alcohol. Recommend follow-up with PCP in the outpatient setting for reassessment.  Treatment plan discussed with patient and she acknowledged understanding was agreeable to said plan.  Patient well-appearing, afebrile in no acute distress. Worrisome signs and symptoms were discussed with the patient, and the patient acknowledged understanding to return to the ED if noticed. Patient was stable upon discharge.      Peter Garter, [place] 08/01/23 1610    Loetta Rough, MD 08/01/23 [phone]

## 2023-09-10 ENCOUNTER — Encounter: Payer: Self-pay | Admitting: Student

## 2023-09-10 ENCOUNTER — Encounter: Payer: Self-pay | Admitting: Internal Medicine

## 2023-09-10 ENCOUNTER — Ambulatory Visit (INDEPENDENT_AMBULATORY_CARE_PROVIDER_SITE_OTHER): Admitting: Student

## 2023-09-10 ENCOUNTER — Other Ambulatory Visit (HOSPITAL_COMMUNITY): Payer: Self-pay

## 2023-09-10 VITALS — BP 109/84 | HR 92 | Ht 69.0 in | Wt 132.8 lb

## 2023-09-10 DIAGNOSIS — E119 Type 2 diabetes mellitus without complications: Secondary | ICD-10-CM

## 2023-09-10 DIAGNOSIS — Z122 Encounter for screening for malignant neoplasm of respiratory organs: Secondary | ICD-10-CM | POA: Diagnosis not present

## 2023-09-10 DIAGNOSIS — Z1231 Encounter for screening mammogram for malignant neoplasm of breast: Secondary | ICD-10-CM

## 2023-09-10 LAB — POCT GLYCOSYLATED HEMOGLOBIN (HGB A1C): HbA1c, POC (controlled diabetic range): 5.7 % (ref 0.0–7.0)

## 2023-09-10 MED ORDER — GABAPENTIN 300 MG PO CAPS
300.0000 mg | ORAL_CAPSULE | Freq: Three times a day (TID) | ORAL | 1 refills | Status: DC
  Filled 2023-09-10: qty 90, 30d supply, fill #0

## 2023-09-10 MED ORDER — METFORMIN HCL ER 500 MG PO TB24
500.0000 mg | ORAL_TABLET | Freq: Two times a day (BID) | ORAL | 2 refills | Status: DC
  Filled 2023-09-10: qty 60, 30d supply, fill #0

## 2023-09-10 MED ORDER — METFORMIN HCL ER 500 MG PO TB24
500.0000 mg | ORAL_TABLET | Freq: Two times a day (BID) | ORAL | 2 refills | Status: DC
Start: 2023-09-10 — End: 2023-09-10
  Filled 2023-09-10: qty 60, 30d supply, fill #0

## 2023-09-10 NOTE — Assessment & Plan Note (Signed)
 Well-controlled with A1c of 5.7% today.  Good tolerance and compliance to diabetic medication.  No hyperglycemic or hypoglycemic episodes. -Continue metformin  1000 mg daily - Obtained A1c, microalbuminuria/creatinine ratio -Patient encouraged to complete annual diabetes eye exam

## 2023-09-10 NOTE — Patient Instructions (Addendum)
 Pleasure to see you today.  Your A1c improved to 5.7% today.  Please make sure you are taking your medications as prescribed.  Will be very beneficial if you can increase your activity level such as exercising or going on walks.  These make sure to complete your annual diabetes eye exam.  You are currently due for your Pap smear, mammogram and chest imaging probably to screen for lung cancer.  Placed order for your mammogram and chest CT.  You which you can complete a [address] [place], [place].  You can go to this address to have your mammogram completed.  You should receive a call to schedule an appointment to complete your chest CT.

## 2023-09-10 NOTE — Progress Notes (Signed)
    SUBJECTIVE:   CHIEF COMPLAINT / HPI:   53 year old female with history of type 2 diabetes and psoriasis Presenting today for annual checkup No concerns today Endorses difficulty with falling asleep, denies sleep apnea Currently decent appetite and eats regular diet Smokes tobacco daily for over 20 years, no illicit drug use Sexually active.  Generally not active, no exercise.    Type 2 Diabetes  A1c from 6 months ago 6.2% Due for annual diabete eye exam On metformin  1000 mg daily Other meds include Crestor  10 mg daily, losartan  25 mg daily Due for annual diabetes eye exam.  Health maintenance Due for mammogram, Pap and Chest CT, shingles  PERTINENT  PMH / PSH: Reviewed   OBJECTIVE:   BP 109/84   Pulse 92   Ht 5\' 9"  (1.753 m)   Wt 132 lb 12.8 oz (60.2 kg)   SpO2 100%   BMI 19.61 kg/m    Physical Exam General: Alert, well appearing, NAD HEENT: Normal TM bilaterally,MMM, no cervical lymphadenopathy Cardiovascular: RRR, No Murmurs, Normal S2/S2 Respiratory: CTAB, No wheezing or Rales Abdomen: No distension or tenderness Extremities: No edema on extremities   Neuro: No focal neurological deficits Psych: Normal affect, good judgment  ASSESSMENT/PLAN:   Annual Physical Work physical done today including the all of the following: Reviewed patient's Family Medical History Reviewed and updated list of patient's medical providers Counseled patient on tobacco, alcohol  use Recommend moderate exercise at least 150 hours a week Counseled patient on sleep hygiene Emphasized need for healthy dieting Assessment of cognitive impairment was done Assessed patient's functional ability Health Risk Assessent Completed and Reviewed.  Considered the following screening exams based upon USPSTF recommendations: Diabetes screening: A1c ordered Screening for elevated cholesterol: Lipid panel postponed per patient  HIV testing: Declined  Hepatitis C: Not recommend  Hepatitis  B: Declined  Syphilis if at high risk: Declined  Reviewed risk factors for latent tuberculosis and not indicated Colorectal cancer screening: Up-to-date Breast cancer screening: Mammogram ordered Cervical cancer screening: Patient declined Pap smear  Type 2 diabetes mellitus without complication, without long-term current use of insulin  (HCC) Well-controlled with A1c of 5.7% today.  Good tolerance and compliance to diabetic medication.  No hyperglycemic or hypoglycemic episodes. -Continue metformin  1000 mg daily - Obtained A1c, microalbuminuria/creatinine ratio -Patient encouraged to complete annual diabetes eye exam     Goble Last, MD [facility]

## 2023-09-13 LAB — MICROALBUMIN / CREATININE URINE RATIO
Creatinine, Urine: 57 mg/dL
Microalb/Creat Ratio: 35 mg/g{creat} — ABNORMAL HIGH (ref 0–29)
Microalbumin, Urine: 19.8 ug/mL

## 2023-09-23 ENCOUNTER — Telehealth: Payer: Self-pay

## 2023-09-23 ENCOUNTER — Other Ambulatory Visit (HOSPITAL_COMMUNITY): Payer: Self-pay

## 2023-09-23 NOTE — Telephone Encounter (Signed)
 Received call from [facility], [facility] regarding request for personal care services.   Patient seen in the office on 09/10/23.  Printed off 3051 form, completed demographics and placed in PCP box.   Please advise if patient needs additional appointment to further discuss prior to completion.   Elsie Halo, RN

## 2023-09-25 ENCOUNTER — Ambulatory Visit

## 2023-10-01 ENCOUNTER — Ambulatory Visit: Admitting: Student

## 2023-10-02 NOTE — Telephone Encounter (Signed)
 Patient has visit scheduled on 10/06/23.  Elsie Halo, RN

## 2023-10-06 ENCOUNTER — Encounter: Payer: Self-pay | Admitting: Student

## 2023-10-06 ENCOUNTER — Ambulatory Visit (INDEPENDENT_AMBULATORY_CARE_PROVIDER_SITE_OTHER): Admitting: Student

## 2023-10-06 VITALS — BP 177/93 | HR 88 | Wt 120.4 lb

## 2023-10-06 DIAGNOSIS — E1159 Type 2 diabetes mellitus with other circulatory complications: Secondary | ICD-10-CM

## 2023-10-06 DIAGNOSIS — I152 Hypertension secondary to endocrine disorders: Secondary | ICD-10-CM

## 2023-10-06 NOTE — Assessment & Plan Note (Signed)
 BP elevated.  Patient currently asymptomatic.  Reports missing her medications.  Patient advised compliance with medication.  She will check her BP daily and if elevated will return for medication adjustment.

## 2023-10-06 NOTE — Assessment & Plan Note (Signed)
 Chronic fatigue with unremarkable workup. No specific diagnosis for PCS form. Informed patient fatigue alone does not qualify for PCS. - Encouraged gradual increase in physical activity. - Follow up via MyChart for questions or concerns.

## 2023-10-06 NOTE — Progress Notes (Signed)
    SUBJECTIVE:   CHIEF COMPLAINT / HPI:   Christine [name] is a 53 year old female who presents today requesting for PCS form for fatigue..  She experiences persistent fatigue that significantly impacts her daily activities, often requiring her to sit down due to exhaustion. Previous evaluations, including thyroid  function and vitamin levels, were unremarkable.  PERTINENT  PMH / PSH: Reviewed   OBJECTIVE:   BP (!) 177/93   Pulse 88   Wt 120 lb 6.4 oz (54.6 kg)   SpO2 100%   BMI 17.78 kg/m    Physical Exam General: Alert, well appearing, NAD Cardiovascular: Regular rate, well-perfused Respiratory: Normal work of breathing on RA Psych: Good judgment, normal affect  ASSESSMENT/PLAN:   Hypertension associated with diabetes (HCC) BP elevated.  Patient currently asymptomatic.  Reports missing her medications.  Patient advised compliance with medication.  She will check her BP daily and if elevated will return for medication adjustment.  Fatigue Chronic fatigue with unremarkable workup. No specific diagnosis for PCS form. Informed patient fatigue alone does not qualify for PCS. - Encouraged gradual increase in physical activity. - Follow up via MyChart for questions or concerns.    Goble Last, MD [facility]

## 2023-12-22 ENCOUNTER — Other Ambulatory Visit: Payer: Self-pay

## 2023-12-22 ENCOUNTER — Other Ambulatory Visit (HOSPITAL_COMMUNITY): Payer: Self-pay

## 2023-12-22 DIAGNOSIS — I10 Essential (primary) hypertension: Secondary | ICD-10-CM

## 2023-12-22 MED ORDER — CLOPIDOGREL BISULFATE 75 MG PO TABS
75.0000 mg | ORAL_TABLET | Freq: Every day | ORAL | 1 refills | Status: AC
Start: 2023-12-22 — End: ?
  Filled 2023-12-22 – 2024-01-12 (×2): qty 90, 90d supply, fill #0

## 2023-12-22 MED ORDER — LOSARTAN POTASSIUM 25 MG PO TABS
25.0000 mg | ORAL_TABLET | Freq: Every day | ORAL | 3 refills | Status: AC
  Filled 2023-12-22 – 2024-01-12 (×2): qty 90, 90d supply, fill #0

## 2023-12-22 MED ORDER — GABAPENTIN 300 MG PO CAPS
300.0000 mg | ORAL_CAPSULE | Freq: Three times a day (TID) | ORAL | 1 refills | Status: AC
  Filled 2023-12-22 – 2024-01-12 (×2): qty 90, 30d supply, fill #0

## 2023-12-22 MED ORDER — METFORMIN HCL ER 500 MG PO TB24
500.0000 mg | ORAL_TABLET | Freq: Two times a day (BID) | ORAL | 2 refills | Status: AC
  Filled 2023-12-22 – 2024-01-12 (×2): qty 60, 30d supply, fill #0

## 2023-12-25 ENCOUNTER — Other Ambulatory Visit (HOSPITAL_COMMUNITY): Payer: Self-pay

## 2023-12-31 ENCOUNTER — Other Ambulatory Visit (HOSPITAL_COMMUNITY): Payer: Self-pay

## 2024-01-12 ENCOUNTER — Other Ambulatory Visit (HOSPITAL_COMMUNITY): Payer: Self-pay

## 2024-02-20 ENCOUNTER — Encounter (HOSPITAL_COMMUNITY): Payer: Self-pay | Admitting: Nephrology

## 2024-02-20 ENCOUNTER — Telehealth: Payer: Self-pay | Admitting: Pharmacy Technician

## 2024-02-20 ENCOUNTER — Other Ambulatory Visit: Payer: Self-pay | Admitting: Nephrology

## 2024-02-20 DIAGNOSIS — D631 Anemia in chronic kidney disease: Secondary | ICD-10-CM | POA: Insufficient documentation

## 2024-02-20 NOTE — Telephone Encounter (Signed)
 Auth Submission: NO AUTH NEEDED Site of care: Site of care: [facility] Payer: [employer] Medication & CPT/J Code(s) submitted: Venofer (Iron  Sucrose) J1756 Diagnosis Code:  Route of submission (phone, fax, portal):  Phone # Fax # Auth type: Buy/Bill HB Units/visits requested:  Reference number:  Approval from: 02/20/24 to 04/28/24
